# Patient Record
Sex: Male | Born: 1955 | Race: Black or African American | Hispanic: No | Marital: Single | Smoking: Never smoker
Health system: Southern US, Community
[De-identification: ages and names within clinical notes are randomized; demographics above are authoritative.]

## PROBLEM LIST (undated history)

## (undated) DIAGNOSIS — C8590 Non-Hodgkin lymphoma, unspecified, unspecified site: Secondary | ICD-10-CM

## (undated) DIAGNOSIS — R590 Localized enlarged lymph nodes: Secondary | ICD-10-CM

## (undated) DIAGNOSIS — G4730 Sleep apnea, unspecified: Secondary | ICD-10-CM

## (undated) DIAGNOSIS — E119 Type 2 diabetes mellitus without complications: Secondary | ICD-10-CM

## (undated) DIAGNOSIS — I10 Essential (primary) hypertension: Secondary | ICD-10-CM

---

## 2002-07-05 ENCOUNTER — Emergency Department (HOSPITAL_COMMUNITY): Admission: EM | Admit: 2002-07-05 | Discharge: 2002-07-05 | Payer: Self-pay | Admitting: Emergency Medicine

## 2002-07-06 ENCOUNTER — Ambulatory Visit (HOSPITAL_COMMUNITY): Admission: RE | Admit: 2002-07-06 | Discharge: 2002-07-06 | Payer: Self-pay | Admitting: Emergency Medicine

## 2002-07-06 ENCOUNTER — Encounter: Payer: Self-pay | Admitting: Emergency Medicine

## 2005-06-10 ENCOUNTER — Ambulatory Visit: Admission: RE | Admit: 2005-06-10 | Discharge: 2005-06-10 | Payer: Self-pay | Admitting: Family Medicine

## 2005-06-11 ENCOUNTER — Ambulatory Visit: Payer: Self-pay | Admitting: Pulmonary Disease

## 2005-10-12 ENCOUNTER — Ambulatory Visit (HOSPITAL_COMMUNITY): Admission: RE | Admit: 2005-10-12 | Discharge: 2005-10-12 | Payer: Self-pay | Admitting: Internal Medicine

## 2005-12-11 ENCOUNTER — Encounter (HOSPITAL_COMMUNITY): Admission: RE | Admit: 2005-12-11 | Discharge: 2006-01-10 | Payer: Self-pay | Admitting: General Surgery

## 2006-01-13 ENCOUNTER — Emergency Department (HOSPITAL_COMMUNITY): Admission: EM | Admit: 2006-01-13 | Discharge: 2006-01-14 | Payer: Self-pay | Admitting: Emergency Medicine

## 2006-07-21 ENCOUNTER — Emergency Department (HOSPITAL_COMMUNITY): Admission: EM | Admit: 2006-07-21 | Discharge: 2006-07-21 | Payer: Self-pay | Admitting: Emergency Medicine

## 2006-07-25 ENCOUNTER — Ambulatory Visit (HOSPITAL_COMMUNITY): Admission: RE | Admit: 2006-07-25 | Discharge: 2006-07-25 | Payer: Self-pay | Admitting: General Surgery

## 2006-11-07 ENCOUNTER — Ambulatory Visit: Payer: Self-pay | Admitting: Cardiology

## 2006-11-26 ENCOUNTER — Encounter: Admission: RE | Admit: 2006-11-26 | Discharge: 2007-02-24 | Payer: Self-pay | Admitting: Cardiology

## 2007-10-29 ENCOUNTER — Ambulatory Visit (HOSPITAL_COMMUNITY): Admission: RE | Admit: 2007-10-29 | Discharge: 2007-10-29 | Payer: Self-pay | Admitting: General Surgery

## 2007-11-20 DIAGNOSIS — C8590 Non-Hodgkin lymphoma, unspecified, unspecified site: Secondary | ICD-10-CM

## 2007-11-20 HISTORY — DX: Non-Hodgkin lymphoma, unspecified, unspecified site: C85.90

## 2007-12-09 ENCOUNTER — Encounter (INDEPENDENT_AMBULATORY_CARE_PROVIDER_SITE_OTHER): Payer: Self-pay | Admitting: General Surgery

## 2007-12-09 ENCOUNTER — Encounter (INDEPENDENT_AMBULATORY_CARE_PROVIDER_SITE_OTHER): Payer: Self-pay | Admitting: Diagnostic Radiology

## 2007-12-09 ENCOUNTER — Ambulatory Visit (HOSPITAL_COMMUNITY): Admission: RE | Admit: 2007-12-09 | Discharge: 2007-12-09 | Payer: Self-pay | Admitting: General Surgery

## 2008-01-02 ENCOUNTER — Ambulatory Visit (HOSPITAL_COMMUNITY): Admission: RE | Admit: 2008-01-02 | Discharge: 2008-01-02 | Payer: Self-pay | Admitting: General Surgery

## 2008-01-02 ENCOUNTER — Encounter (INDEPENDENT_AMBULATORY_CARE_PROVIDER_SITE_OTHER): Payer: Self-pay | Admitting: General Surgery

## 2008-02-06 ENCOUNTER — Encounter (HOSPITAL_COMMUNITY): Admission: RE | Admit: 2008-02-06 | Discharge: 2008-03-07 | Payer: Self-pay | Admitting: Oncology

## 2008-02-06 ENCOUNTER — Ambulatory Visit (HOSPITAL_COMMUNITY): Payer: Self-pay | Admitting: Oncology

## 2008-05-13 ENCOUNTER — Ambulatory Visit (HOSPITAL_COMMUNITY): Payer: Self-pay | Admitting: Oncology

## 2008-05-13 ENCOUNTER — Encounter (HOSPITAL_COMMUNITY): Admission: RE | Admit: 2008-05-13 | Discharge: 2008-06-12 | Payer: Self-pay | Admitting: Oncology

## 2008-09-08 ENCOUNTER — Ambulatory Visit (HOSPITAL_COMMUNITY): Payer: Self-pay | Admitting: Oncology

## 2008-09-08 ENCOUNTER — Encounter (HOSPITAL_COMMUNITY): Admission: RE | Admit: 2008-09-08 | Discharge: 2008-10-08 | Payer: Self-pay | Admitting: Oncology

## 2009-03-04 ENCOUNTER — Encounter (HOSPITAL_COMMUNITY): Admission: RE | Admit: 2009-03-04 | Discharge: 2009-04-03 | Payer: Self-pay | Admitting: Oncology

## 2009-03-04 ENCOUNTER — Ambulatory Visit (HOSPITAL_COMMUNITY): Payer: Self-pay | Admitting: Oncology

## 2009-09-12 ENCOUNTER — Encounter (HOSPITAL_COMMUNITY): Admission: RE | Admit: 2009-09-12 | Discharge: 2009-10-12 | Payer: Self-pay | Admitting: Oncology

## 2009-09-12 ENCOUNTER — Ambulatory Visit (HOSPITAL_COMMUNITY): Payer: Self-pay | Admitting: Oncology

## 2010-12-10 ENCOUNTER — Encounter (HOSPITAL_COMMUNITY): Payer: Self-pay | Admitting: Oncology

## 2011-02-22 LAB — COMPREHENSIVE METABOLIC PANEL
ALT: 16 U/L (ref 0–53)
AST: 21 U/L (ref 0–37)
Albumin: 3.8 g/dL (ref 3.5–5.2)
Alkaline Phosphatase: 65 U/L (ref 39–117)
BUN: 12 mg/dL (ref 6–23)
CO2: 31 mEq/L (ref 19–32)
Calcium: 9.3 mg/dL (ref 8.4–10.5)
Chloride: 101 mEq/L (ref 96–112)
Creatinine, Ser: 0.96 mg/dL (ref 0.4–1.5)
GFR calc Af Amer: >60 mL/min (ref >60)
GFR calc non Af Amer: >60 mL/min (ref >60)
Glucose, Bld: 115 mg/dL — ABNORMAL HIGH (ref 70–99)
Potassium: 3.6 mEq/L (ref 3.5–5.1)
Sodium: 138 mEq/L (ref 135–145)
Total Bilirubin: 1.1 mg/dL (ref 0.3–1.2)
Total Protein: 7.9 g/dL (ref 6.0–8.3)

## 2011-02-22 LAB — DIFFERENTIAL
Basophils Absolute: 0 10*3/uL (ref 0.0–0.1)
Basophils Relative: 0 % (ref 0–1)
Eosinophils Absolute: 0.1 10*3/uL (ref 0.0–0.7)
Eosinophils Relative: 3 % (ref 0–5)
Lymphocytes Relative: 18 % (ref 12–46)
Lymphs Abs: 0.8 10*3/uL (ref 0.7–4.0)
Monocytes Absolute: 0.5 10*3/uL (ref 0.1–1.0)
Monocytes Relative: 11 % (ref 3–12)
Neutro Abs: 2.9 10*3/uL (ref 1.7–7.7)
Neutrophils Relative %: 68 % (ref 43–77)

## 2011-02-22 LAB — CBC
HCT: 36.2 % — ABNORMAL LOW (ref 39.0–52.0)
Hemoglobin: 12.3 g/dL — ABNORMAL LOW (ref 13.0–17.0)
MCHC: 34 g/dL (ref 30.0–36.0)
MCV: 88.6 fL (ref 78.0–100.0)
Platelets: 196 10*3/uL (ref 150–400)
RBC: 4.09 MIL/uL — ABNORMAL LOW (ref 4.22–5.81)
RDW: 14.5 % (ref 11.5–15.5)
WBC: 4.2 10*3/uL (ref 4.0–10.5)

## 2011-02-22 LAB — LACTATE DEHYDROGENASE: LDH: 172 U/L (ref 94–250)

## 2011-02-28 LAB — COMPREHENSIVE METABOLIC PANEL
ALT: 16 U/L (ref 0–53)
AST: 20 U/L (ref 0–37)
Albumin: 3.9 g/dL (ref 3.5–5.2)
Alkaline Phosphatase: 86 U/L (ref 39–117)
BUN: 15 mg/dL (ref 6–23)
CO2: 29 mEq/L (ref 19–32)
Calcium: 9.7 mg/dL (ref 8.4–10.5)
Chloride: 97 mEq/L (ref 96–112)
Creatinine, Ser: 0.9 mg/dL (ref 0.4–1.5)
GFR calc Af Amer: >60 mL/min (ref >60)
GFR calc non Af Amer: >60 mL/min (ref >60)
Glucose, Bld: 110 mg/dL — ABNORMAL HIGH (ref 70–99)
Potassium: 3.7 mEq/L (ref 3.5–5.1)
Sodium: 136 mEq/L (ref 135–145)
Total Bilirubin: 1 mg/dL (ref 0.3–1.2)
Total Protein: 7.8 g/dL (ref 6.0–8.3)

## 2011-02-28 LAB — DIFFERENTIAL
Basophils Absolute: 0 10*3/uL (ref 0.0–0.1)
Basophils Relative: 0 % (ref 0–1)
Eosinophils Absolute: 0.1 10*3/uL (ref 0.0–0.7)
Eosinophils Relative: 2 % (ref 0–5)
Lymphocytes Relative: 17 % (ref 12–46)
Lymphs Abs: 0.9 10*3/uL (ref 0.7–4.0)
Monocytes Absolute: 0.7 10*3/uL (ref 0.1–1.0)
Monocytes Relative: 12 % (ref 3–12)
Neutro Abs: 3.7 10*3/uL (ref 1.7–7.7)
Neutrophils Relative %: 69 % (ref 43–77)

## 2011-02-28 LAB — CBC
HCT: 39 % (ref 39.0–52.0)
Hemoglobin: 13.2 g/dL (ref 13.0–17.0)
MCHC: 33.8 g/dL (ref 30.0–36.0)
MCV: 88 fL (ref 78.0–100.0)
Platelets: 200 10*3/uL (ref 150–400)
RBC: 4.44 MIL/uL (ref 4.22–5.81)
RDW: 14.2 % (ref 11.5–15.5)
WBC: 5.3 10*3/uL (ref 4.0–10.5)

## 2011-02-28 LAB — BETA 2 MICROGLOBULIN, SERUM: Beta-2 Microglobulin: 1.43 mg/L (ref 1.01–1.73)

## 2011-02-28 LAB — LACTATE DEHYDROGENASE: LDH: 187 U/L (ref 94–250)

## 2011-04-03 NOTE — Op Note (Signed)
NAME:  Douglas [name], Douglas [name] NO.:  [id number]   MEDICAL RECORD NO.:  [id number]          PATIENT TYPE:  AMB   LOCATION:  DAY                           FACILITY:  [facility]   PHYSICIAN:  Barbaraann Barthel, M.D. DATE OF BIRTH:  [birth year]   DATE OF PROCEDURE:  01/02/2008  DATE OF DISCHARGE:                               OPERATIVE REPORT   PREOPERATIVE DIAGNOSIS:  Abnormal adenopathy of the left groin status  post abnormal needle biopsy.   NOTE:  This is a 55 year old morbidly obese diabetic black male who  presented with groin adenopathy that was prominent on the left side.  He  was seen by Dr. Renard Matter. The patient had a needle biopsy and this was  nondiagnostic and tissue was requested to rule out lymphoma or some soft  tissue mass.   GROSS OPERATIVE FINDINGS:  Massive lymph node in the groin area.   PROCEDURE:  Incisional lymph node biopsy.   TECHNIQUE:  The patient was placed in the supine position.  I palpated a  grossly enlarged lymph node in the left groin near where he was biopsied  in then the past.  And after prepping with Betadine solution and draping  in the usual manner, a transverse incision was carried over this node.  The lymph node was dissected down.  This was a massively enlarged lymph  node close to the large vessels and since there was a great deal of  lymph node available, I made an incisional biopsy for diagnostic  purposes.  I did not try to remove the entire massively matted lymph  node.  After this was done, I controlled bleeding with the cautery  device and approximated the lymph node with 3-0 Vicryl and I elected to  leave a piece of Surgicel within the groin tissues.  After checking for  hemostasis, the wound was then closed using the stapling device.  Prior  to closure, all sponge, needle, and instrument counts were found to be  correct.  Estimated blood loss was minimal.  The patient received 800 mL  of crystalloids intraoperatively.  The  specimen was sent as fresh in  saline as instructed.  We made follow-up arrangements to see this  patient perioperatively for wound care.      Barbaraann Barthel, M.D.  Electronically Signed     WB/MEDQ  D:  01/02/2008  T:  01/03/2008  Job:  [id number]   cc:   Angus G. Renard Matter, MD  Fax: [phone]

## 2011-04-06 NOTE — Letter (Signed)
November 07, 2006    Douglas G. Renard Matter, MD  [address]  [place], [place] [address]   RE:  Douglas [name], Douglas [name]  MRN:  [id number]  /  DOB:  Nov 09, 1956   Dear Thalia Party,   It was my pleasure evaluating Douglas [name] in the office today in  consultation at your request.  As you know, this nice gentleman has a  history of diabetes and hypertension, resulting in a substantial risk  for vascular disease.  He has long-standing obesity.  He has not used  tobacco products.  He has dyslipidemia that is currently under  pharmacologic therapy.   Douglas [name] does not have chest discomfort, nor dyspnea.  He  maintains an active lifestyle, including performing yard work and in his  occupation as a Scientist, clinical (histocompatibility and immunogenetics).  He is diligent  about following his medical regimen.  He follows a diabetic diet.  He  has not been able to do much about his weight with diet and exercise.   PAST MEDICAL HISTORY:  Otherwise generally benign.  He was recently  evaluated for hematochezia.  I am not aware of what specific diagnosis  accounted for his bleeding.  He has otherwise never been hospitalized.  He has had no surgery.   ALLERGIES:  He has no known allergies.   CURRENT MEDICATIONS INCLUDE:  1. Metformin 500 mg daily.  2. Atorvastatin 20 mg daily.  3. Benicar/HCT 40/25 mg daily.  4. Aspirin 81 mg daily.  5. Triamterene/HCTZ 37.5/25 mg daily.   SOCIAL HISTORY:  No excessive use of alcohol.  Single with no children.   FAMILY HISTORY:  Father died at a young age with cardiac disease.  Likewise, his mother died at age 25, due to myocardial infarction.  He  has three siblings, one of which died due to trauma and one of which has  cardiac problems.   REVIEW OF SYSTEMS:  Notable for the need for corrective lenses,  intermittent constipation, episodic edema in the lower extremities.  All  other systems reviewed and are negative.   EXAM:  Pleasant, obese gentleman, in no acute  distress.  The weight is  427, blood pressure 145/85, heart rate 75 and regular, respirations 16.  HEENT:  Normal funduscopic examination; EOM full.  Normal oral mucosa.  NECK:  No jugular venous distention; normal carotid upstrokes, without  bruits.  LUNGS:  Clear with decreased breath sounds in the bases.  CARDIAC:  Normal first and second heart sounds; fourth heart sound  present; PMI not palpable.  ABDOMEN:  Obese, otherwise benign.  EXTREMITIES:  Trace edema; distal pulses intact.  NEUROMUSCULAR:  Symmetric strength and tone; normal cranial nerves.  MUSCULOSKELETAL:  No joint deformities.  SKIN:  No significant lesions.  ENDOCRINE:  No thyromegaly.   EKG:  Normal sinus rhythm, incomplete right bundle branch block.  Cannot  exclude prior septal myocardial infarction. Borderline first degree AV  block.  Left anterior fascicular block.  No prior tracing for  comparison.   LABORATORY:  Kindly supplied by your office, is superb with a hemoglobin  A1c level of 6.4, a total cholesterol of 134, HDL of 54 and LDL of 68.   IMPRESSION:  Douglas [name] has substantial cardiovascular risk, based  upon obesity, diabetes, hypertension, dyslipidemia and a worrisome  family history.  Unfortunately, due to his weight, he is not a candidate  for treadmill exercise.  Similarly, the tables for nuclear imaging  cannot accommodate a patient of his size.  Moreover, the  likelihood of a  poor quality nuclear study is extremely high.  He certainly does not  merit cardiac catheterization.  A CT scan for coronary artery scoring  could be considered, but many of the machines would not accommodate Mr.  [name] either.   I believe it is reasonable to continue optimal management of his risk  factors.  I discussed diet with him.  He will have a formal consultation  with the dietician.  I also discussed the possibility of  bariatric surgery, although I would not necessarily recommend that for  Mr.  [name] at the present time.  He is receiving duplicate  diuretics in his Dyazide and Benicar/HCT.  The latter will be changed to  plain Benicar.  Please let me know at any time that I can offer further  assistance in the care of this nice gentleman.    Sincerely,      Gerrit Friends. Dietrich Pates, MD, [facility]  Electronically Signed    RMR/MedQ  DD: 11/07/2006  DT: 11/08/2006  Job #: [id number]

## 2011-04-06 NOTE — H&P (Signed)
NAME:  Douglas [name], Douglas [name] NO.:  [id number]   MEDICAL RECORD NO.:  [id number]          PATIENT TYPE:  AMB   LOCATION:  DAY                           FACILITY:  [facility]   PHYSICIAN:  Dalia Heading, M.D.  DATE OF BIRTH:  [birth year]   DATE OF ADMISSION:  DATE OF DISCHARGE:  LH                                HISTORY & PHYSICAL   CHIEF COMPLAINT:  Hematochezia.   HISTORY OF PRESENT ILLNESS:  The patient is a 55 year old black male who is  referred for endoscopic evaluation.  Needs colonoscopy for hematochezia.  He  recently noted some blood in his stools.  No abdominal pain, weight loss,  nausea, vomiting, diarrhea, constipation, melena have been noted.  He has  never had a colonoscopy.  There is no immediate family history of colon  carcinoma.   PAST MEDICAL HISTORY:  Includes a perirectal abscess.   PAST SURGICAL HISTORY:  Unremarkable.   CURRENT MEDICATIONS:  None.   ALLERGIES:  No known drug allergies.   REVIEW OF SYSTEMS:  Noncontributory.   PHYSICAL EXAMINATION:  GENERAL:  The patient is a morbidly obese black male  in no acute distress.  LUNGS:  Clear to auscultation with equal breath sounds bilaterally.  HEART: Examination reveals regular rate and rhythm without S3, S4, or  murmurs.  ABDOMEN:  The abdomen is soft, nontender, nondistended.  No  hepatosplenomegaly or masses noted.  RECTAL:  Examination was deferred to the procedure.   IMPRESSION:  Hematochezia.   PLAN:  The patient is scheduled for colonoscopy on July 25, 2006.  Risks  and benefits of the procedure including bleeding and perforation were fully  explained to the patient, gave informed consent.      Dalia Heading, M.D.  Electronically Signed     MAJ/MEDQ  D:  07/23/2006  T:  07/23/2006  Job:  [id number]   cc:   [facility]  Fax: [phone]   Angus G. Renard Matter, MD  Fax: [phone]

## 2011-04-06 NOTE — Procedures (Signed)
NAME:  Douglas [name], Douglas [name] NO.:  [id number]   MEDICAL RECORD NO.:  [id number]          PATIENT TYPE:  OUT   LOCATION:  SLEEP LAB                     FACILITY:  [facility]   PHYSICIAN:  Marcelyn Bruins, M.D. [facility] DATE OF BIRTH:  [birth year]   DATE OF STUDY:  06/10/2005                              NOCTURNAL POLYSOMNOGRAM   REFERRING PHYSICIAN:  Butch Penny, MD   INDICATION FOR STUDY:  Hypersomnia with sleep apnea.   EPWORTH SCORE:  10   SLEEP ARCHITECTURE:  The patient had total sleep time of 275 minutes with  decreased REM and slow wave sleep. Sleep efficiency was 72%. Sleep onset  latency was normal; however, REM onset was very prolonged.   IMPRESSION:  1.  Very severe obstructive sleep apnea/hypopnea syndrome with a respiratory      disturbance index of 127 events per hour and O2 desaturation as low as      57%. Treatment of this degree of sleep apnea will primarily involves      weight loss coupled with CPAP.  2.  Very loud snoring noted throughout the study.  3.  Occasional premature ventricular contractures noted.  4.  O2 at 1/2 liter per minute was added because of the patient's severe O2      desaturation.     ______________________________  Suzzette Righter    KC/MEDQ  D:  06/11/2005 16:04:08  T:  06/11/2005 22:36:58  Job:  [id number]

## 2011-08-10 LAB — CBC
HCT: 35.6 — ABNORMAL LOW
HCT: 35.3 — ABNORMAL LOW
Hemoglobin: 11.9 — ABNORMAL LOW
Hemoglobin: 11.9 — ABNORMAL LOW
MCHC: 33.6
MCHC: 33.8
MCV: 87.7
MCV: 88.2
Platelets: 208
Platelets: 210
RBC: 4.06 — ABNORMAL LOW
RBC: 4 — ABNORMAL LOW
RDW: 14.5
RDW: 14
WBC: 3.7 — ABNORMAL LOW
WBC: 4.1

## 2011-08-10 LAB — DIFFERENTIAL
Basophils Absolute: 0
Basophils Relative: 0
Eosinophils Absolute: 0.1
Eosinophils Relative: 3
Lymphocytes Relative: 23
Lymphs Abs: 0.9
Monocytes Absolute: 0.5
Monocytes Relative: 12
Neutro Abs: 2.5
Neutrophils Relative %: 62

## 2011-08-10 LAB — BASIC METABOLIC PANEL
BUN: 15
CO2: 30
Calcium: 9.6
Chloride: 99
Creatinine, Ser: 0.87
GFR calc Af Amer: >60
GFR calc non Af Amer: >60
Glucose, Bld: 104 — ABNORMAL HIGH
Potassium: 4.2
Sodium: 136

## 2011-08-10 LAB — PROTIME-INR
INR: 1
Prothrombin Time: 13.8

## 2011-08-13 LAB — CBC
HCT: 37 — ABNORMAL LOW
Hemoglobin: 12.6 — ABNORMAL LOW
MCHC: 34
MCV: 87.1
Platelets: 235
RBC: 4.24
RDW: 13.9
WBC: 4.6

## 2011-08-13 LAB — COMPREHENSIVE METABOLIC PANEL
ALT: 18
AST: 21
Albumin: 3.6
Alkaline Phosphatase: 65
Chloride: 98
GFR calc Af Amer: >60
Potassium: 4
Sodium: 135
Total Bilirubin: 1
Total Protein: 8

## 2011-08-13 LAB — DIFFERENTIAL
Basophils Absolute: 0
Basophils Relative: 0
Eosinophils Absolute: 0.1
Eosinophils Relative: 2
Monocytes Absolute: 0.4
Monocytes Relative: 9
Neutro Abs: 3.2

## 2011-08-16 LAB — COMPREHENSIVE METABOLIC PANEL
Alkaline Phosphatase: 62
BUN: 13
Creatinine, Ser: 0.87
Glucose, Bld: 110 — ABNORMAL HIGH
Potassium: 3.5
Total Bilirubin: 1
Total Protein: 7.2

## 2011-08-16 LAB — DIFFERENTIAL
Basophils Absolute: 0
Basophils Relative: 0
Lymphocytes Relative: 23
Neutro Abs: 3
Neutrophils Relative %: 65

## 2011-08-16 LAB — CBC
HCT: 34.5 — ABNORMAL LOW
Hemoglobin: 11.7 — ABNORMAL LOW
MCV: 87.5
RDW: 14.3

## 2011-08-16 LAB — BETA 2 MICROGLOBULIN, SERUM: Beta-2 Microglobulin: 1.81 — ABNORMAL HIGH

## 2011-08-16 LAB — LACTATE DEHYDROGENASE: LDH: 199

## 2011-08-20 LAB — DIFFERENTIAL
Basophils Relative: 0
Eosinophils Absolute: 0.2
Monocytes Absolute: 0.6
Monocytes Relative: 11
Neutro Abs: 3.8

## 2011-08-20 LAB — COMPREHENSIVE METABOLIC PANEL
ALT: 14
AST: 20
Albumin: 3.6
Alkaline Phosphatase: 69
GFR calc Af Amer: >60
Potassium: 3.8
Sodium: 138
Total Protein: 7.5

## 2011-08-20 LAB — BETA 2 MICROGLOBULIN, SERUM: Beta-2 Microglobulin: 1.72

## 2011-08-20 LAB — CBC
Platelets: 216
RDW: 14.1

## 2012-06-23 ENCOUNTER — Ambulatory Visit (HOSPITAL_COMMUNITY)
Admission: RE | Admit: 2012-06-23 | Discharge: 2012-06-23 | Disposition: A | Discharge status: Home / Self Care | Payer: BC Managed Care – PPO | Source: Ambulatory Visit | Attending: Family Medicine | Admitting: Family Medicine

## 2012-06-23 ENCOUNTER — Other Ambulatory Visit (HOSPITAL_COMMUNITY): Payer: Self-pay | Admitting: Family Medicine

## 2012-06-23 DIAGNOSIS — M25562 Pain in left knee: Secondary | ICD-10-CM

## 2012-06-23 DIAGNOSIS — R609 Edema, unspecified: Secondary | ICD-10-CM

## 2012-06-23 DIAGNOSIS — M7989 Other specified soft tissue disorders: Secondary | ICD-10-CM | POA: Insufficient documentation

## 2012-06-23 DIAGNOSIS — M25569 Pain in unspecified knee: Secondary | ICD-10-CM | POA: Insufficient documentation

## 2013-10-30 ENCOUNTER — Emergency Department (HOSPITAL_COMMUNITY): Payer: BC Managed Care – PPO

## 2013-10-30 ENCOUNTER — Inpatient Hospital Stay (HOSPITAL_COMMUNITY)
Admission: EM | Admit: 2013-10-30 | Discharge: 2013-11-03 | DRG: 580 | Disposition: A | Discharge status: Home / Self Care | Payer: BC Managed Care – PPO | Attending: Internal Medicine | Admitting: Internal Medicine

## 2013-10-30 ENCOUNTER — Encounter (HOSPITAL_COMMUNITY): Payer: Self-pay | Admitting: Emergency Medicine

## 2013-10-30 DIAGNOSIS — I10 Essential (primary) hypertension: Secondary | ICD-10-CM

## 2013-10-30 DIAGNOSIS — E119 Type 2 diabetes mellitus without complications: Secondary | ICD-10-CM | POA: Diagnosis present

## 2013-10-30 DIAGNOSIS — E785 Hyperlipidemia, unspecified: Secondary | ICD-10-CM

## 2013-10-30 DIAGNOSIS — Z79899 Other long term (current) drug therapy: Secondary | ICD-10-CM

## 2013-10-30 DIAGNOSIS — R1904 Left lower quadrant abdominal swelling, mass and lump: Secondary | ICD-10-CM

## 2013-10-30 DIAGNOSIS — I890 Lymphedema, not elsewhere classified: Secondary | ICD-10-CM | POA: Diagnosis present

## 2013-10-30 DIAGNOSIS — C8590 Non-Hodgkin lymphoma, unspecified, unspecified site: Secondary | ICD-10-CM

## 2013-10-30 DIAGNOSIS — Z6841 Body Mass Index (BMI) 40.0 and over, adult: Secondary | ICD-10-CM

## 2013-10-30 DIAGNOSIS — L0390 Cellulitis, unspecified: Secondary | ICD-10-CM

## 2013-10-30 DIAGNOSIS — C8299 Follicular lymphoma, unspecified, extranodal and solid organ sites: Secondary | ICD-10-CM | POA: Diagnosis present

## 2013-10-30 DIAGNOSIS — D638 Anemia in other chronic diseases classified elsewhere: Secondary | ICD-10-CM

## 2013-10-30 DIAGNOSIS — R2242 Localized swelling, mass and lump, left lower limb: Secondary | ICD-10-CM

## 2013-10-30 DIAGNOSIS — Z7982 Long term (current) use of aspirin: Secondary | ICD-10-CM

## 2013-10-30 DIAGNOSIS — IMO0001 Reserved for inherently not codable concepts without codable children: Secondary | ICD-10-CM

## 2013-10-30 DIAGNOSIS — G4730 Sleep apnea, unspecified: Secondary | ICD-10-CM | POA: Diagnosis present

## 2013-10-30 DIAGNOSIS — L02419 Cutaneous abscess of limb, unspecified: Principal | ICD-10-CM | POA: Diagnosis present

## 2013-10-30 HISTORY — DX: Type 2 diabetes mellitus without complications: E11.9

## 2013-10-30 HISTORY — DX: Non-Hodgkin lymphoma, unspecified, unspecified site: C85.90

## 2013-10-30 HISTORY — DX: Essential (primary) hypertension: I10

## 2013-10-30 HISTORY — DX: Sleep apnea, unspecified: G47.30

## 2013-10-30 LAB — URINALYSIS, ROUTINE W REFLEX MICROSCOPIC
Nitrite: NEGATIVE
Specific Gravity, Urine: >1.03 — ABNORMAL HIGH (ref 1.005–1.030)
pH: 5.5 (ref 5.0–8.0)

## 2013-10-30 LAB — COMPREHENSIVE METABOLIC PANEL
ALT: 8 U/L (ref 0–53)
BUN: 12 mg/dL (ref 6–23)
Calcium: 9.7 mg/dL (ref 8.4–10.5)
Creatinine, Ser: 0.83 mg/dL (ref 0.50–1.35)
GFR calc Af Amer: >90 mL/min (ref >90)
Glucose, Bld: 145 mg/dL — ABNORMAL HIGH (ref 70–99)
Sodium: 136 mEq/L (ref 135–145)
Total Protein: 8.6 g/dL — ABNORMAL HIGH (ref 6.0–8.3)

## 2013-10-30 LAB — URINE MICROSCOPIC-ADD ON

## 2013-10-30 LAB — CBC WITH DIFFERENTIAL/PLATELET
Eosinophils Absolute: 0.5 10*3/uL (ref 0.0–0.7)
Eosinophils Relative: 7 % — ABNORMAL HIGH (ref 0–5)
Lymphs Abs: 0.9 10*3/uL (ref 0.7–4.0)
MCH: 27.9 pg (ref 26.0–34.0)
MCV: 87.5 fL (ref 78.0–100.0)
Platelets: 295 10*3/uL (ref 150–400)
RBC: 4.01 MIL/uL — ABNORMAL LOW (ref 4.22–5.81)

## 2013-10-30 MED ORDER — SODIUM CHLORIDE 0.9 % IV SOLN
INTRAVENOUS | Status: DC
  Administered 2013-10-30: 17:00:00 via INTRAVENOUS
  Administered 2013-11-02: 50 mL/h via INTRAVENOUS

## 2013-10-30 MED ORDER — IOHEXOL 300 MG/ML  SOLN
20.0000 mL | INTRAMUSCULAR | Status: AC
  Administered 2013-10-30: 25 mL via ORAL

## 2013-10-30 MED ORDER — IOHEXOL 300 MG/ML  SOLN
100.0000 mL | Freq: Once | INTRAMUSCULAR | Status: AC | PRN
  Administered 2013-10-30: 100 mL via INTRAVENOUS

## 2013-10-30 NOTE — ED Notes (Signed)
Pain LLQ, intermittently for 2 weeks , with swelling of lt leg.  Hx of NHL, 2009.

## 2013-10-30 NOTE — ED Notes (Signed)
Called Douglas [name] in Radiology to let them know that pt was on his way and had a 20g saline lock in his right forearm.

## 2013-10-30 NOTE — ED Provider Notes (Signed)
CSN: [id number]     Arrival date & time 10/30/13  1624 History  This chart was scribed for Hilario Quarry, MD by Quintella Reichert, ED scribe.  This patient was seen in room APA09/APA09 and the patient's care was started at 4:45 PM.   Chief Complaint  Patient presents with  . Abdominal Pain    Patient is a 57 y.o. male presenting with abdominal pain. The history is provided by the patient. No language interpreter was used.  Abdominal Pain Pain location:  LLQ Pain severity:  Moderate Duration: 1.5 weeks. Timing:  Intermittent Context comment:  Non-Hodgkin's lymphoma Worsened by:  Palpation and position changes Associated symptoms: no chest pain, no diarrhea, no nausea, no shortness of breath and no vomiting   Risk factors: obesity     HPI Comments: Douglas [name] is a 57 y.o. male with non-Hodgkin's lymphoma (not currently being treated), DM, HTN and sleep apnea who presents to the Emergency Department complaining of a mass in his LLQ abdomen that has been painful intermittently for 1 1/2 weeks.  Pt states he has non-Hodgkin's lymphoma and the painful mass has been diagnosed as cancerous.  He was diagnosed with NHL in 2009 and states the mass has been growing larger over the past 2 years.  His oncologist was Dr. Mariel Sleet but he has not been treated for this in 4-5 years.  He was not receiving chemotherapy.  He states that at some point he "went to [place] and they wanted to set me up with a program and I refused the program."  Currently he rates his pain to the area at 6-8/10.  He states that pain is brought on by certain positions and by pressing on the area.  He denies HA, visual changes, CP, SOB, nausea, vomiting, diarrhea, other bowel symptoms, or recent changes to his chronic leg swelling.  He states he is ambulatory but sometimes has to elevate his legs in order to reduce his swelling.  Pt takes oral medications for his DM and states he is taking all of his medications as instructed.     Past Medical History  Diagnosis Date  . Non Hodgkin's lymphoma   . Diabetes mellitus without complication   . Hypertension   . Sleep apnea     Past Surgical History  Procedure Laterality Date  . Biopsy for lymphoma      History reviewed. No pertinent family history.   History  Substance Use Topics  . Smoking status: Never Smoker   . Smokeless tobacco: Not on file  . Alcohol Use: No     Review of Systems  Eyes: Negative for visual disturbance.  Respiratory: Negative for shortness of breath.   Cardiovascular: Positive for leg swelling (chronic, no changes). Negative for chest pain.  Gastrointestinal: Positive for abdominal pain (painful mass in LLQ). Negative for nausea, vomiting and diarrhea.  Neurological: Negative for headaches.  All other systems reviewed and are negative.     Allergies  Review of patient's allergies indicates no known allergies.  Home Medications  No current outpatient prescriptions on file.  BP 156/62  Pulse 87  Temp(Src) 99.5 F (37.5 C) (Oral)  Resp 20  Ht 5\' 10"  (1.778 m)  Wt 420 lb (190.511 kg)  BMI 60.26 kg/m2  SpO2 99%  Physical Exam  Nursing note and vitals reviewed. Constitutional: He is oriented to person, place, and time. He appears well-developed and well-nourished.  HENT:  Head: Normocephalic and atraumatic.  Right Ear: External ear normal.  Left  Ear: External ear normal.  Nose: Nose normal.  Mouth/Throat: Oropharynx is clear and moist.  Eyes: Conjunctivae and EOM are normal. Pupils are equal, round, and reactive to light.  Neck: Normal range of motion. Neck supple.  Cardiovascular: Normal rate, regular rhythm, normal heart sounds and intact distal pulses.   Pulmonary/Chest: Effort normal and breath sounds normal. No respiratory distress. He has no wheezes. He exhibits no tenderness.  Abdominal: Soft. Bowel sounds are normal. He exhibits no distension and no mass. There is no tenderness. There is no guarding.   Morbidly obese male  Musculoskeletal: Normal range of motion.  Legs swollen from knees down Approximately 10x20-cm mass in anterior upper leg, large and firm, tender to palpation  Neurological: He is alert and oriented to person, place, and time. He has normal reflexes. He exhibits normal muscle tone. Coordination normal.  Skin: Skin is warm and dry.  Chronic skin changes and chronic skin thickening to bilateral legs  Psychiatric: He has a normal mood and affect. His behavior is normal. Judgment and thought content normal.    ED Course  Procedures (including critical care time)  DIAGNOSTIC STUDIES: Oxygen Saturation is 99% on room air, normal by my interpretation.    COORDINATION OF CARE: 4:54 PM-Discussed treatment plan which includes labs with pt at bedside and pt agreed to plan.    Labs Review Labs Reviewed  CBC WITH DIFFERENTIAL - Abnormal; Notable for the following:    RBC 4.01 (*)    Hemoglobin 11.2 (*)    HCT 35.1 (*)    Eosinophils Relative 7 (*)    All other components within normal limits  COMPREHENSIVE METABOLIC PANEL - Abnormal; Notable for the following:    Glucose, Bld 145 (*)    Total Protein 8.6 (*)    Albumin 3.3 (*)    All other components within normal limits  URINALYSIS, ROUTINE W REFLEX MICROSCOPIC - Abnormal; Notable for the following:    Specific Gravity, Urine >1.030 (*)    Hgb urine dipstick TRACE (*)    Leukocytes, UA TRACE (*)    All other components within normal limits  URINE MICROSCOPIC-ADD ON    Imaging Review No results found.  EKG Interpretation   None       MDM  No diagnosis found. Patient unable to have ct here due to weight limits.  Plan transfer patient to [facility] for [facility].  Discussed with radiologist and patient.  Patient wishes to drive.  We will leave iv access inplace.  The patient is to be directed to the ed for results of ct. Discussed with Dr. Virgina Jock, MD 10/30/13 [phone]

## 2013-10-31 DIAGNOSIS — R2242 Localized swelling, mass and lump, left lower limb: Secondary | ICD-10-CM | POA: Diagnosis present

## 2013-10-31 DIAGNOSIS — I10 Essential (primary) hypertension: Secondary | ICD-10-CM | POA: Diagnosis present

## 2013-10-31 DIAGNOSIS — E785 Hyperlipidemia, unspecified: Secondary | ICD-10-CM | POA: Diagnosis present

## 2013-10-31 DIAGNOSIS — C8590 Non-Hodgkin lymphoma, unspecified, unspecified site: Secondary | ICD-10-CM | POA: Diagnosis present

## 2013-10-31 DIAGNOSIS — R1032 Left lower quadrant pain: Secondary | ICD-10-CM

## 2013-10-31 DIAGNOSIS — R1904 Left lower quadrant abdominal swelling, mass and lump: Secondary | ICD-10-CM | POA: Diagnosis present

## 2013-10-31 DIAGNOSIS — L0291 Cutaneous abscess, unspecified: Secondary | ICD-10-CM

## 2013-10-31 DIAGNOSIS — D638 Anemia in other chronic diseases classified elsewhere: Secondary | ICD-10-CM

## 2013-10-31 DIAGNOSIS — C8589 Other specified types of non-Hodgkin lymphoma, extranodal and solid organ sites: Secondary | ICD-10-CM

## 2013-10-31 DIAGNOSIS — L0390 Cellulitis, unspecified: Secondary | ICD-10-CM | POA: Diagnosis present

## 2013-10-31 LAB — GLUCOSE, CAPILLARY
Glucose-Capillary: 127 mg/dL — ABNORMAL HIGH (ref 70–99)
Glucose-Capillary: 124 mg/dL — ABNORMAL HIGH (ref 70–99)
Glucose-Capillary: 146 mg/dL — ABNORMAL HIGH (ref 70–99)

## 2013-10-31 MED ORDER — ASPIRIN EC 81 MG PO TBEC
81.0000 mg | DELAYED_RELEASE_TABLET | Freq: Every day | ORAL | Status: DC
  Administered 2013-10-31 – 2013-11-03 (×4): 81 mg via ORAL
  Filled 2013-10-31 (×4): qty 1

## 2013-10-31 MED ORDER — ONDANSETRON HCL 4 MG/2ML IJ SOLN
4.0000 mg | Freq: Once | INTRAMUSCULAR | Status: AC
  Administered 2013-10-31: 4 mg via INTRAVENOUS
  Filled 2013-10-31: qty 2

## 2013-10-31 MED ORDER — INSULIN ASPART 100 UNIT/ML ~~LOC~~ SOLN
0.0000 [IU] | Freq: Three times a day (TID) | SUBCUTANEOUS | Status: DC
  Administered 2013-10-31 – 2013-11-03 (×5): 2 [IU] via SUBCUTANEOUS

## 2013-10-31 MED ORDER — HYDROCHLOROTHIAZIDE 25 MG PO TABS
25.0000 mg | ORAL_TABLET | Freq: Every day | ORAL | Status: DC
  Administered 2013-10-31 – 2013-11-03 (×4): 25 mg via ORAL
  Filled 2013-10-31 (×4): qty 1

## 2013-10-31 MED ORDER — FENTANYL CITRATE 0.05 MG/ML IJ SOLN
50.0000 ug | INTRAMUSCULAR | Status: DC | PRN
  Administered 2013-10-31 (×2): 50 ug via INTRAVENOUS
  Filled 2013-10-31 (×2): qty 2

## 2013-10-31 MED ORDER — CLINDAMYCIN PHOSPHATE 300 MG/50ML IV SOLN
300.0000 mg | Freq: Three times a day (TID) | INTRAVENOUS | Status: DC
  Administered 2013-10-31 – 2013-11-02 (×7): 300 mg via INTRAVENOUS
  Filled 2013-10-31 (×7): qty 50

## 2013-10-31 MED ORDER — IRBESARTAN 300 MG PO TABS
300.0000 mg | ORAL_TABLET | Freq: Every day | ORAL | Status: DC
  Administered 2013-10-31 – 2013-11-03 (×4): 300 mg via ORAL
  Filled 2013-10-31 (×4): qty 1

## 2013-10-31 MED ORDER — INSULIN ASPART 100 UNIT/ML ~~LOC~~ SOLN: 0.0000 [IU] | Freq: Three times a day (TID) | SUBCUTANEOUS | Status: DC

## 2013-10-31 MED ORDER — VALSARTAN-HYDROCHLOROTHIAZIDE 320-25 MG PO TABS: 1.0000 | ORAL_TABLET | Freq: Every day | ORAL | Status: DC

## 2013-10-31 MED ORDER — ATORVASTATIN CALCIUM 20 MG PO TABS
20.0000 mg | ORAL_TABLET | Freq: Every day | ORAL | Status: DC
  Administered 2013-10-31 – 2013-11-02 (×3): 20 mg via ORAL
  Filled 2013-10-31 (×4): qty 1

## 2013-10-31 MED ORDER — HEPARIN SODIUM (PORCINE) 5000 UNIT/ML IJ SOLN
5000.0000 [IU] | Freq: Three times a day (TID) | INTRAMUSCULAR | Status: DC
  Filled 2013-10-31 (×4): qty 1

## 2013-10-31 NOTE — ED Provider Notes (Signed)
CSN: [id number]     Arrival date & time 10/30/13  1624 History   First MD Initiated Contact with Patient 10/30/13 1644     Chief Complaint  Patient presents with  . Abdominal Pain   (Consider location/radiation/quality/duration/timing/severity/associated sxs/prior Treatment) HPI  HX per PT - has NHL, now about 10 days of increasing mod to severe pain LLQ ABD and L groin. Worse with movement, currently not treated for NHL. He has known mass/ lymphoma in this region. No F/C, no N/V/D. No weakness or numbness.   Past Medical History  Diagnosis Date  . Non Hodgkin's lymphoma   . Diabetes mellitus without complication   . Hypertension   . Sleep apnea    Past Surgical History  Procedure Laterality Date  . Biopsy for lymphoma     History reviewed. No pertinent family history. History  Substance Use Topics  . Smoking status: Never Smoker   . Smokeless tobacco: Not on file  . Alcohol Use: No    Review of Systems  Constitutional: Negative for fever and chills.  Respiratory: Negative for shortness of breath.   Cardiovascular: Negative for chest pain.  Gastrointestinal: Positive for abdominal pain.  Genitourinary: Negative for dysuria.  Musculoskeletal: Negative for back pain, neck pain and neck stiffness.  Skin: Negative for rash.  Neurological: Negative for headaches.  All other systems reviewed and are negative.    Allergies  Review of patient's allergies indicates no known allergies.  Home Medications   Current Outpatient Rx  Name  Route  Sig  Dispense  Refill  . aspirin EC 81 MG tablet   Oral   Take 81 mg by mouth daily.         [name] atorvastatin (LIPITOR) 20 MG tablet   Oral   Take 20 mg by mouth daily.         . pioglitazone-metformin (ACTOPLUS MET) 15-500 MG per tablet   Oral   Take 1 tablet by mouth 2 (two) times daily with a meal.         . valsartan-hydrochlorothiazide (DIOVAN-HCT) 320-25 MG per tablet   Oral   Take 1 tablet by mouth daily.           BP 140/64  Pulse 84  Temp(Src) 99.5 F (37.5 C) (Oral)  Resp 22  Ht 5\' 10"  (1.778 m)  Wt 470 lb 7 oz (213.389 kg)  BMI 67.50 kg/m2  SpO2 98% Physical Exam  Constitutional: He is oriented to person, place, and time. He appears well-developed and well-nourished.  HENT:  Head: Normocephalic and atraumatic.  Eyes: EOM are normal. Pupils are equal, round, and reactive to light.  Neck: Neck supple.  Cardiovascular: Normal rate, regular rhythm and intact distal pulses.   Pulmonary/Chest: Effort normal and breath sounds normal. No respiratory distress.  Abdominal:  Soft, obese, TTP L groin and LLQ  Musculoskeletal: Normal range of motion. He exhibits edema.  Neurological: He is alert and oriented to person, place, and time.  Skin: Skin is warm and dry.    ED Course  Procedures (including critical care time) Labs Review Labs Reviewed  CBC WITH DIFFERENTIAL - Abnormal; Notable for the following:    RBC 4.01 (*)    Hemoglobin 11.2 (*)    HCT 35.1 (*)    Eosinophils Relative 7 (*)    All other components within normal limits  COMPREHENSIVE METABOLIC PANEL - Abnormal; Notable for the following:    Glucose, Bld 145 (*)    Total Protein 8.6 (*)  Albumin 3.3 (*)    All other components within normal limits  URINALYSIS, ROUTINE W REFLEX MICROSCOPIC - Abnormal; Notable for the following:    Specific Gravity, Urine >1.030 (*)    Hgb urine dipstick TRACE (*)    Leukocytes, UA TRACE (*)    All other components within normal limits  URINE MICROSCOPIC-ADD ON   Imaging Review Ct Abdomen Pelvis W Contrast  10/31/2013   CLINICAL DATA:  Left groin pain.  Non-Hodgkin's lymphoma.  EXAM: CT ABDOMEN AND PELVIS WITH CONTRAST  TECHNIQUE: Multidetector CT imaging of the abdomen and pelvis was performed using the standard protocol following bolus administration of intravenous contrast.  CONTRAST:  OMNIPAQUE IOHEXOL 300 MG/ML  SOLN  COMPARISON:  CT abdomen and pelvis 02/06/2008.   FINDINGS: BODY WALL: Morbid obesity. There are portions of the patient's body which could not be included within the scan due to his size.  In the left groin there is a bulky mass with approximate cross-section of 6 x 15 x 17 cm. There is infra inguinal extension into the upper thigh. Moderate surrounding cellulitic change. Surgical clips are seen superficial to this lesion. No involvement of the scrotum. No air or central necrosis. The findings are most consistent with bulky recurrence of tumor with conglomerate lymph node mass. Recommend correlation with prior surgical pathology and/or repeat tissue sampling as clinically indicated. This mass was not present previously on the scan of 2009.  LOWER CHEST: Unremarkable.  ABDOMEN/PELVIS:  Liver: No focal abnormality.  Biliary: No evidence of biliary obstruction or stone.  Pancreas: Unremarkable.  Spleen: Unremarkable.  Adrenals: Unremarkable.  Kidneys and ureters: No hydronephrosis or stone.  Bladder: Unremarkable.  Reproductive: Unremarkable.  Bowel: No obstruction. Normal appendix.  Retroperitoneum: No mass or adenopathy.  Peritoneum: No free fluid or gas.  Vascular: No acute abnormality.  OSSEOUS: No acute abnormalities.  IMPRESSION: 6 x 15 x 17 mm bulky lesion in the left groin, suspected conglomerate lymph node mass in this patient with non-Hodgkin's lymphoma. Moderate surrounding cellulitic change. Correlate with prior biopsy and/or repeat tissue sampling recommended.   Electronically Signed   By: Davonna Belling M.D.   On: 10/31/2013 00:44   IV fentanyl pain control CT results shared with PT  - he is requesting Tx for NHL 2:17 AM d/w Dr Julian Reil - will admit  MDM   1. Groin pain, left lower quadrant    Ct reviewed as above Labs reviewed IV narcotics MED admit    Sunnie Nielsen, MD 10/31/13 [phone]

## 2013-10-31 NOTE — Progress Notes (Signed)
Pt placed on CPAP set at 11 CMH2O per home settings via FFM.  Pt tolerating well at this time, RT to monitor and assess as needed.

## 2013-10-31 NOTE — Progress Notes (Addendum)
[facility] PROGRESS NOTE  Douglas [name]:[id number] DOB: 03-23-1956 DOA: 10/30/2013 PCP: Alice Reichert, MD  Brief narrative: 57 y.o. male with known history of NHL who presents to the [facility] ED 10/30/213 with worsening discomfort from a slowly enlarging left medial thigh mass  for the past 2 years. Pt reported increasing  Discomfort at the area but no fever or chills. No open wounds. CT abdomen revealed left thigh mass likely related to NHL as well as surrounding cellulitic changes.   Assessment/Plan:  Principal Problem:   Mass of left thigh - based on CT abd findings, likely related to history of NHL - due to findings of possible surrounding cellulitic changes will start clinda - appreciate surgery consult Active Problems:   NHL (non-Hodgkin's lymphoma) - never had treatment for NHL: will consult onc for input   Cellulitis - started clindamycin   HTN (hypertension) - continue Hctz   Dyslipidemia - continue atorvastatin   Anemia of chronic disease - likely related to history of NHL - hemoglobin stable - no indications for transfusion   Morbid obesity - nutrition consulted  Code Status: full code Family Communication: no family at the bedside Disposition Plan: home once stable  [name], MD  [facility] Pager [phone]  If 7PM-7AM, please contact night-coverage [email or web address] Password [email or web address] 10/31/2013, 7:03 AM   LOS: 1 day   Consultants:  None   Procedures:  None   Antibiotics:  Clindamycin 10/31/2013 -->  HPI/Subjective: No overnight events.   Objective: Filed Vitals:   10/31/13 0415 10/31/13 0431 10/31/13 0445 10/31/13 0507  BP: 135/69 130/56 164/95   Pulse: 77 79 98   Temp:   98.7 F (37.1 C)   TempSrc:   Oral   Resp:   20   Height:    5\' 10"  (1.778 m)  Weight:    214.5 kg (472 lb 14.2 oz)  SpO2: 99% 98% 98%    No intake or output data in the 24 hours ending 10/31/13 0703  Exam:   General:  Pt is alert, follows  commands appropriately, not in acute distress  Cardiovascular: Regular rate and rhythm, S1/S2 appreciated   Respiratory: Clear to auscultation bilaterally, no wheezing, no crackles, no rhonchi  Abdomen: Soft, non tender, non distended, bowel sounds present, no guarding  Extremities: morbidly obese extremities; left medial thigh mass pretty decent size and bulging out with no drainage, some tenderness on palpation, not fluctuating  Neuro: Grossly nonfocal  Data Reviewed: Basic Metabolic Panel:  Recent Labs Lab 10/30/13 1709  NA 136  K 3.7  CL 96  CO2 28  GLUCOSE 145*  BUN 12  CREATININE 0.83  CALCIUM 9.7   Liver Function Tests:  Recent Labs Lab 10/30/13 1709  AST 18  ALT 8  ALKPHOS 86  BILITOT 0.5  PROT 8.6*  ALBUMIN 3.3*   No results found for this basename: LIPASE, AMYLASE,  in the last 168 hours No results found for this basename: AMMONIA,  in the last 168 hours CBC:  Recent Labs Lab 10/30/13 1709  WBC 6.5  NEUTROABS 4.6  HGB 11.2*  HCT 35.1*  MCV 87.5  PLT 295   Cardiac Enzymes: No results found for this basename: CKTOTAL, CKMB, CKMBINDEX, TROPONINI,  in the last 168 hours BNP: No components found with this basename: POCBNP,  CBG: No results found for this basename: GLUCAP,  in the last 168 hours  No results found for this or any previous visit (from the past 240 hour(s)).   Studies:  Ct Abdomen Pelvis W Contrast 10/31/2013    IMPRESSION: 6 x 15 x 17 mm bulky lesion in the left groin, suspected conglomerate lymph node mass in this patient with non-Hodgkin's lymphoma. Moderate surrounding cellulitic change. Correlate with prior biopsy and/or repeat tissue sampling recommended.    Scheduled Meds: . aspirin EC  81 mg Oral Daily  . atorvastatin  20 mg Oral q1800  . heparin  5,000 Units Subcutaneous Q8H  . hydrochlorothiazide  25 mg Oral Daily  . insulin aspart  0-15 Units Subcutaneous TID WC  . irbesartan  300 mg Oral Daily   Continuous  Infusions: . sodium chloride Stopped (10/30/13 2114)

## 2013-10-31 NOTE — ED Notes (Signed)
Pt called back to CT to get further images.

## 2013-10-31 NOTE — Care Management Note (Signed)
UR Complete   Aala Ransom,RN,MSN [phone]

## 2013-10-31 NOTE — ED Notes (Signed)
Per Beacher May, MD, pt to remain in the ER until the appropriate floor can be decided upon.

## 2013-10-31 NOTE — H&P (Signed)
[facility] History and Physical  Douglas [name] ZOX:[id number] DOB: Dec 21, 1955 DOA: 10/30/2013  Referring physician: EDP PCP: Alice Reichert, MD   Chief Complaint: Groin mass   HPI: Douglas [name] is a 57 y.o. male with known history of NHL who presents to the ED with worsening discomfort from a slowly enlarging mass.  Mass is located in his LLQ abdomen and groin, has been growing for the past 2 years.  He has a h/o NHL that has not been treated for the past 4-5 years.  The groin mass has been irritating him constantly for the past 10 days or so.  Because of this he now wishes to have treatment.  Review of Systems: Systems reviewed.  As above, otherwise negative  Past Medical History  Diagnosis Date  . Non Hodgkin's lymphoma   . Diabetes mellitus without complication   . Hypertension   . Sleep apnea    Past Surgical History  Procedure Laterality Date  . Biopsy for lymphoma     Social History:  reports that he has never smoked. He does not have any smokeless tobacco history on file. He reports that he does not drink alcohol or use illicit drugs.  No Known Allergies  History reviewed. No pertinent family history.   Prior to Admission medications   Medication Sig Start Date End Date Taking? Authorizing Provider  aspirin EC 81 MG tablet Take 81 mg by mouth daily.   Yes Historical Provider, MD  atorvastatin (LIPITOR) 20 MG tablet Take 20 mg by mouth daily.   Yes Historical Provider, MD  pioglitazone-metformin (ACTOPLUS MET) 15-500 MG per tablet Take 1 tablet by mouth 2 (two) times daily with a meal.   Yes Historical Provider, MD  valsartan-hydrochlorothiazide (DIOVAN-HCT) 320-25 MG per tablet Take 1 tablet by mouth daily.   Yes Historical Provider, MD   Physical Exam: Filed Vitals:   10/31/13 0233  BP: 135/56  Pulse: 79  Temp: 98.8 F (37.1 C)  Resp:     BP 135/56  Pulse 79  Temp(Src) 98.8 F (37.1 C) (Oral)  Resp 22  Ht 5\' 10"  (1.778 m)  Wt  213.389 kg (470 lb 7 oz)  BMI 67.50 kg/m2  SpO2 95%  General Appearance:    Alert, oriented, no distress, appears stated age  Head:    Normocephalic, atraumatic  Eyes:    PERRL, EOMI, sclera non-icteric        Nose:   Nares without drainage or epistaxis. Mucosa, turbinates normal  Throat:   Moist mucous membranes. Oropharynx without erythema or exudate.  Neck:   Supple. No carotid bruits.  No thyromegaly.  No lymphadenopathy.   Back:     No CVA tenderness, no spinal tenderness  Lungs:     Clear to auscultation bilaterally, without wheezes, rhonchi or rales  Chest wall:    No tenderness to palpitation  Heart:    Regular rate and rhythm without murmurs, gallops, rubs  Abdomen:     Soft, non-tender, nondistended, normal bowel sounds, no organomegaly  Genitalia:    deferred  Rectal:    deferred  Extremities:   No clubbing, cyanosis or edema.  Pulses:   2+ and symmetric all extremities  Skin:   Skin color, texture, turgor normal, no rashes or lesions  Lymph nodes:   Cervical, supraclavicular, and axillary nodes normal  Neurologic:   CNII-XII intact. Normal strength, sensation and reflexes      throughout    Labs on Admission:  Basic Metabolic Panel:  Recent Labs Lab 10/30/13 1709  NA 136  K 3.7  CL 96  CO2 28  GLUCOSE 145*  BUN 12  CREATININE 0.83  CALCIUM 9.7   Liver Function Tests:  Recent Labs Lab 10/30/13 1709  AST 18  ALT 8  ALKPHOS 86  BILITOT 0.5  PROT 8.6*  ALBUMIN 3.3*   No results found for this basename: LIPASE, AMYLASE,  in the last 168 hours No results found for this basename: AMMONIA,  in the last 168 hours CBC:  Recent Labs Lab 10/30/13 1709  WBC 6.5  NEUTROABS 4.6  HGB 11.2*  HCT 35.1*  MCV 87.5  PLT 295   Cardiac Enzymes: No results found for this basename: CKTOTAL, CKMB, CKMBINDEX, TROPONINI,  in the last 168 hours  BNP (last 3 results) No results found for this basename: PROBNP,  in the last 8760 hours CBG: No results found for  this basename: GLUCAP,  in the last 168 hours  Radiological Exams on Admission: Ct Abdomen Pelvis W Contrast  10/31/2013   CLINICAL DATA:  Left groin pain.  Non-Hodgkin's lymphoma.  EXAM: CT ABDOMEN AND PELVIS WITH CONTRAST  TECHNIQUE: Multidetector CT imaging of the abdomen and pelvis was performed using the standard protocol following bolus administration of intravenous contrast.  CONTRAST:  OMNIPAQUE IOHEXOL 300 MG/ML  SOLN  COMPARISON:  CT abdomen and pelvis 02/06/2008.  FINDINGS: BODY WALL: Morbid obesity. There are portions of the patient's body which could not be included within the scan due to his size.  In the left groin there is a bulky mass with approximate cross-section of 6 x 15 x 17 cm. There is infra inguinal extension into the upper thigh. Moderate surrounding cellulitic change. Surgical clips are seen superficial to this lesion. No involvement of the scrotum. No air or central necrosis. The findings are most consistent with bulky recurrence of tumor with conglomerate lymph node mass. Recommend correlation with prior surgical pathology and/or repeat tissue sampling as clinically indicated. This mass was not present previously on the scan of 2009.  LOWER CHEST: Unremarkable.  ABDOMEN/PELVIS:  Liver: No focal abnormality.  Biliary: No evidence of biliary obstruction or stone.  Pancreas: Unremarkable.  Spleen: Unremarkable.  Adrenals: Unremarkable.  Kidneys and ureters: No hydronephrosis or stone.  Bladder: Unremarkable.  Reproductive: Unremarkable.  Bowel: No obstruction. Normal appendix.  Retroperitoneum: No mass or adenopathy.  Peritoneum: No free fluid or gas.  Vascular: No acute abnormality.  OSSEOUS: No acute abnormalities.  IMPRESSION: 6 x 15 x 17 mm bulky lesion in the left groin, suspected conglomerate lymph node mass in this patient with non-Hodgkin's lymphoma. Moderate surrounding cellulitic change. Correlate with prior biopsy and/or repeat tissue sampling recommended.    Electronically Signed   By: Davonna Belling M.D.   On: 10/31/2013 00:44    EKG: Independently reviewed.  Assessment/Plan Active Problems:   LLQ abdominal mass   NHL (non-Hodgkin's lymphoma)   1. LLQ groin mass, NHL - NHL tumor causing irritation, spoke briefly with general surgery but their opinion is that given that the patient does have tissue diagnosis already that the most appropriate next step is hematology and oncology evaluation.  Will Admit patient to [facility] so this can be done. 2. DM2 - putting patient on SSI and holding home metformin given the IV contrast load he received with CT.   Code Status: Full  Family Communication: Patient requests that we not inform family of his condition or diagnosis Disposition Plan: Admit to obs   Time spent:  50 min  Douglas [name] M. [facility] Pager [phone]  If 7AM-7PM, please contact the day team taking care of the patient [email or web address] Password [email or web address] 10/31/2013, 4:04 AM

## 2013-10-31 NOTE — ED Notes (Signed)
This RN gave to report to [facility]. ETA 5-7 minutes.

## 2013-11-01 DIAGNOSIS — R599 Enlarged lymph nodes, unspecified: Secondary | ICD-10-CM

## 2013-11-01 LAB — PROTIME-INR: INR: 1.09 (ref 0.00–1.49)

## 2013-11-01 MED ORDER — FENTANYL CITRATE 0.05 MG/ML IJ SOLN: 50.0000 ug | INTRAMUSCULAR | Status: DC | PRN

## 2013-11-01 MED ORDER — IBUPROFEN 800 MG PO TABS
400.0000 mg | ORAL_TABLET | Freq: Four times a day (QID) | ORAL | Status: DC | PRN
  Administered 2013-11-01: 400 mg via ORAL
  Filled 2013-11-01: qty 1

## 2013-11-01 MED ORDER — BENZONATATE 100 MG PO CAPS
200.0000 mg | ORAL_CAPSULE | Freq: Three times a day (TID) | ORAL | Status: DC | PRN
  Administered 2013-11-01 – 2013-11-02 (×2): 200 mg via ORAL
  Filled 2013-11-01 (×3): qty 2

## 2013-11-01 NOTE — Progress Notes (Signed)
Patient complained of a dry cough, requested a prn med. MD on call paged and made aware, with new orders received.

## 2013-11-01 NOTE — Progress Notes (Signed)
Pt brought in home tubing and mask to use tonight.  Pt stated that he is comfortable administering when ready.  Pt to call if any problems should arise.  RT to monitor and assess as needed.

## 2013-11-01 NOTE — Consult Note (Signed)
Reason for Consult:  Painful left inguinal mass Referring Physician: Dr. Jonette Eva is an 57 y.o. male.  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 At that time, he decided not to undergo treatment. He's had a progressively enlarging left inguinal mass over the past year or 2 has become painful now. He was admitted to the hospital because of that. CT scan demonstrates a large mass in the left groin consistent with significant adenopathy was inflammatory changes surrounding it. There is concern that his lymphoma may have transformed into a more aggressive type. We have been asked to see him to perform a repeat biopsy.  He denies any fever or chills. He denies any night sweats. He denies any weight loss.  Past Medical History  Diagnosis Date  . Non Hodgkin's lymphoma   . Diabetes mellitus without complication   . Hypertension   . Sleep apnea     Past Surgical History  Procedure Laterality Date  . Biopsy for lymphoma-left groin      History reviewed. No pertinent family history.  Social History:  reports that he has never smoked. He does not have any smokeless tobacco history on file. He reports that he does not drink alcohol or use illicit drugs.  Allergies: No Known Allergies  Prior to Admission medications   Medication Sig Start Date End Date Taking? Authorizing Provider  aspirin EC 81 MG tablet Take 81 mg by mouth daily.   Yes Historical Provider, MD  atorvastatin (LIPITOR) 20 MG tablet Take 20 mg by mouth daily.   Yes Historical Provider, MD  pioglitazone-metformin (ACTOPLUS MET) 15-500 MG per tablet Take 1 tablet by mouth 2 (two) times daily with a meal.   Yes Historical Provider, MD  valsartan-hydrochlorothiazide (DIOVAN-HCT) 320-25 MG per tablet Take 1 tablet by mouth daily.   Yes Historical Provider, MD     Results for orders placed during the hospital encounter of 10/30/13  (from the past 48 hour(s))  CBC WITH DIFFERENTIAL     Status: Abnormal   Collection Time    10/30/13  5:09 PM      Result Value Range   WBC 6.5  4.0 - 10.5 K/uL   RBC 4.01 (*) 4.22 - 5.81 MIL/uL   Hemoglobin 11.2 (*) 13.0 - 17.0 g/dL   HCT 16.1 (*) 09.6 - 04.5 %   MCV 87.5  78.0 - 100.0 fL   MCH 27.9  26.0 - 34.0 pg   MCHC 31.9  30.0 - 36.0 g/dL   RDW 40.9  81.1 - 91.4 %   Platelets 295  150 - 400 K/uL   Neutrophils Relative % 70  43 - 77 %   Neutro Abs 4.6  1.7 - 7.7 K/uL   Lymphocytes Relative 14  12 - 46 %   Lymphs Abs 0.9  0.7 - 4.0 K/uL   Monocytes Relative 8  3 - 12 %   Monocytes Absolute 0.5  0.1 - 1.0 K/uL   Eosinophils Relative 7 (*) 0 - 5 %   Eosinophils Absolute 0.5  0.0 - 0.7 K/uL   Basophils Relative 0  0 - 1 %   Basophils Absolute 0.0  0.0 - 0.1 K/uL  COMPREHENSIVE METABOLIC PANEL     Status: Abnormal   Collection Time    10/30/13  5:09 PM      Result Value Range   Sodium 136  135 - 145 mEq/L  Potassium 3.7  3.5 - 5.1 mEq/L   Chloride 96  96 - 112 mEq/L   CO2 28  19 - 32 mEq/L   Glucose, Bld 145 (*) 70 - 99 mg/dL   BUN 12  6 - 23 mg/dL   Creatinine, Ser 3.08  0.50 - 1.35 mg/dL   Calcium 9.7  8.4 - 65.7 mg/dL   Total Protein 8.6 (*) 6.0 - 8.3 g/dL   Albumin 3.3 (*) 3.5 - 5.2 g/dL   AST 18  0 - 37 U/L   ALT 8  0 - 53 U/L   Alkaline Phosphatase 86  39 - 117 U/L   Total Bilirubin 0.5  0.3 - 1.2 mg/dL   GFR calc non Af Amer >90  >90 mL/min   GFR calc Af Amer >90  >90 mL/min   Comment: (NOTE)     The eGFR has been calculated using the CKD EPI equation.     This calculation has not been validated in all clinical situations.     eGFR's persistently <90 mL/min signify possible Chronic Kidney     Disease.  URINALYSIS, ROUTINE W REFLEX MICROSCOPIC     Status: Abnormal   Collection Time    10/30/13  6:36 PM      Result Value Range   Color, Urine YELLOW  YELLOW   APPearance CLEAR  CLEAR   Specific Gravity, Urine >1.030 (*) 1.005 - 1.030   pH 5.5  5.0 - 8.0    Glucose, UA NEGATIVE  NEGATIVE mg/dL   Hgb urine dipstick TRACE (*) NEGATIVE   Bilirubin Urine NEGATIVE  NEGATIVE   Ketones, ur NEGATIVE  NEGATIVE mg/dL   Protein, ur NEGATIVE  NEGATIVE mg/dL   Urobilinogen, UA 0.2  0.0 - 1.0 mg/dL   Nitrite NEGATIVE  NEGATIVE   Leukocytes, UA TRACE (*) NEGATIVE  URINE MICROSCOPIC-ADD ON     Status: None   Collection Time    10/30/13  6:36 PM      Result Value Range   Squamous Epithelial / LPF RARE  RARE   WBC, UA 0-2  <3 WBC/hpf   Bacteria, UA RARE  RARE  GLUCOSE, CAPILLARY     Status: Abnormal   Collection Time    10/31/13  7:42 AM      Result Value Range   Glucose-Capillary 127 (*) 70 - 99 mg/dL  GLUCOSE, CAPILLARY     Status: Abnormal   Collection Time    10/31/13 12:03 PM      Result Value Range   Glucose-Capillary 119 (*) 70 - 99 mg/dL  GLUCOSE, CAPILLARY     Status: Abnormal   Collection Time    10/31/13  5:46 PM      Result Value Range   Glucose-Capillary 124 (*) 70 - 99 mg/dL  GLUCOSE, CAPILLARY     Status: Abnormal   Collection Time    10/31/13  9:56 PM      Result Value Range   Glucose-Capillary 146 (*) 70 - 99 mg/dL   Comment 1 Notify RN    GLUCOSE, CAPILLARY     Status: Abnormal   Collection Time    11/01/13  7:50 AM      Result Value Range   Glucose-Capillary 125 (*) 70 - 99 mg/dL    Ct Abdomen Pelvis W Contrast  10/31/2013   CLINICAL DATA:  Left groin pain.  Non-Hodgkin's lymphoma.  EXAM: CT ABDOMEN AND PELVIS WITH CONTRAST  TECHNIQUE: Multidetector CT imaging of the abdomen and pelvis was  performed using the standard protocol following bolus administration of intravenous contrast.  CONTRAST:  OMNIPAQUE IOHEXOL 300 MG/ML  SOLN  COMPARISON:  CT abdomen and pelvis 02/06/2008.  FINDINGS: BODY WALL: Morbid obesity. There are portions of the patient's body which could not be included within the scan due to his size.  In the left groin there is a bulky mass with approximate cross-section of 6 x 15 x 17 cm. There is infra  inguinal extension into the upper thigh. Moderate surrounding cellulitic change. Surgical clips are seen superficial to this lesion. No involvement of the scrotum. No air or central necrosis. The findings are most consistent with bulky recurrence of tumor with conglomerate lymph node mass. Recommend correlation with prior surgical pathology and/or repeat tissue sampling as clinically indicated. This mass was not present previously on the scan of 2009.  LOWER CHEST: Unremarkable.  ABDOMEN/PELVIS:  Liver: No focal abnormality.  Biliary: No evidence of biliary obstruction or stone.  Pancreas: Unremarkable.  Spleen: Unremarkable.  Adrenals: Unremarkable.  Kidneys and ureters: No hydronephrosis or stone.  Bladder: Unremarkable.  Reproductive: Unremarkable.  Bowel: No obstruction. Normal appendix.  Retroperitoneum: No mass or adenopathy.  Peritoneum: No free fluid or gas.  Vascular: No acute abnormality.  OSSEOUS: No acute abnormalities.  IMPRESSION: 6 x 15 x 17 mm bulky lesion in the left groin, suspected conglomerate lymph node mass in this patient with non-Hodgkin's lymphoma. Moderate surrounding cellulitic change. Correlate with prior biopsy and/or repeat tissue sampling recommended.   Electronically Signed   By: Davonna Belling M.D.   On: 10/31/2013 00:44    Review of Systems  Constitutional: Negative for fever, chills, weight loss and malaise/fatigue.  Respiratory: Negative for shortness of breath.   Cardiovascular: Negative for chest pain.  Gastrointestinal: Negative for abdominal pain and constipation.  Genitourinary: Negative for dysuria.  Endo/Heme/Allergies:       No DVT   Blood pressure 116/60, pulse 91, temperature 98 F (36.7 C), temperature source Oral, resp. rate 20, height 5\' 10"  (1.778 m), weight 472 lb 14.2 oz (214.5 kg), SpO2 98.00%. Physical Exam  Constitutional:  Morbidly obese male no acute distress. BMI of 67.5.  HENT:  Head: Normocephalic and atraumatic.  Eyes: EOM are normal. No  scleral icterus.  GI: Soft. He exhibits no distension and no mass. There is no tenderness.  No hepatomegaly or splenomegaly although he is morbidly obese and the exam is difficult.  Genitourinary:  Large, approximately 15 cm firm left groin mass. No erythema.  Lymphadenopathy:    He has no cervical adenopathy.    Assessment/Plan: Enlarging left inguinal adenopathy in a patient with known low-grade follicular lymphoma. There is concern that this is transformed into a more aggressive type of lymphoma.  Plan: I discussed performing an open versus large core needle biopsy with multiple passes. This will need to be sent fresh to pathology as a likely it will need to be done early next week when the pathologists are here. He understands this. We'll check a PT/PTT on him.  Verline Kong J 11/01/2013, 10:04 AM

## 2013-11-01 NOTE — Progress Notes (Signed)
[facility] PROGRESS NOTE  Douglas [name] ZOX:[id number] DOB: [birth year] DOA: 10/30/2013 PCP: Alice Reichert, MD  Brief narrative: 57 y.o. male with known history of NHL who presents to the [facility] ED 10/30/213 with worsening discomfort from a slowly enlarging left medial thigh mass for the past 2 years. Pt reported increasing Discomfort at the area but no fever or chills. No open wounds. CT abdomen revealed left thigh mass likely related to NHL as well as surrounding cellulitic changes.   Assessment/Plan:   Principal Problem:  Mass of left thigh  - based on CT abd findings, likely related to history of NHL  - due to findings of possible surrounding cellulitic changes will start clinda  - appreciate surgery consult   Active Problems:  NHL (non-Hodgkin's lymphoma)  - never had treatment for NHL: will consult onc for input  Cellulitis  - left medial thigh - started clindamycin  HTN (hypertension)  - continue Hctz  Dyslipidemia  - continue atorvastatin  Anemia of chronic disease  - likely related to history of NHL  - hemoglobin stable  - no indications for transfusion  Morbid obesity  - nutrition consulted   Code Status: full code  Family Communication: no family at the bedside  Disposition Plan: home once stable   Consultants:  Surgery Procedures:  None  Antibiotics:  Clindamycin 10/31/2013 -->   [name], MD  [facility] Pager [phone]  If 7PM-7AM, please contact night-coverage [email or web address] Password [facility] 11/01/2013, 6:36 AM   LOS: 2 days    HPI/Subjective: No overnight events.   Objective: Filed Vitals:   10/31/13 0445 10/31/13 0507 10/31/13 1501 10/31/13 2200  BP: 164/95  121/57 118/71  Pulse: 98  78 81  Temp: 98.7 F (37.1 C)  98.1 F (36.7 C) 99.9 F (37.7 C)  TempSrc: Oral  Oral Oral  Resp: 20  18 16   Height:  5\' 10"  (1.778 m)    Weight:  214.5 kg (472 lb 14.2 oz)    SpO2: 98%  99% 97%    Intake/Output Summary (Last  24 hours) at 11/01/13 0636 Last data filed at 10/31/13 1837  Gross per 24 hour  Intake    480 ml  Output      0 ml  Net    480 ml    Exam:  General: Pt is alert, follows commands appropriately, not in acute distress  Cardiovascular: Regular rate and rhythm, S1/S2 appreciated  Respiratory: Clear to auscultation bilaterally, no wheezing, no crackles, no rhonchi  Abdomen: Soft, non tender, non distended, bowel sounds present, no guarding  Extremities: morbidly obese extremities; left medial thigh mass bulging out with no drainage, some tenderness on palpation, not fluctuating  Neuro: Grossly nonfocal   Data Reviewed: Basic Metabolic Panel:  Recent Labs Lab 10/30/13 1709  NA 136  K 3.7  CL 96  CO2 28  GLUCOSE 145*  BUN 12  CREATININE 0.83  CALCIUM 9.7   Liver Function Tests:  Recent Labs Lab 10/30/13 1709  AST 18  ALT 8  ALKPHOS 86  BILITOT 0.5  PROT 8.6*  ALBUMIN 3.3*   No results found for this basename: LIPASE, AMYLASE,  in the last 168 hours No results found for this basename: AMMONIA,  in the last 168 hours CBC:  Recent Labs Lab 10/30/13 1709  WBC 6.5  NEUTROABS 4.6  HGB 11.2*  HCT 35.1*  MCV 87.5  PLT 295   Cardiac Enzymes: No results found for this basename: CKTOTAL, CKMB, CKMBINDEX, TROPONINI,  in  the last 168 hours BNP: No components found with this basename: POCBNP,  CBG:  Recent Labs Lab 10/31/13 0742 10/31/13 1203 10/31/13 1746 10/31/13 2156  GLUCAP 127* 119* 124* 146*    No results found for this or any previous visit (from the past 240 hour(s)).   Studies: Ct Abdomen Pelvis W Contrast 10/31/2013    IMPRESSION: 6 x 15 x 17 mm bulky lesion in the left groin, suspected conglomerate lymph node mass in this patient with non-Hodgkin's lymphoma. Moderate surrounding cellulitic change. Correlate with prior biopsy and/or repeat tissue sampling recommended.      Scheduled Meds: . aspirin EC  81 mg Oral Daily  . atorvastatin  20 mg Oral  q1800  . clindamycin (CLEOCIN)   300 mg Intravenous Q8H  . hydrochlorothiazide  25 mg Oral Daily  . insulin aspart  0-15 Units Subcutaneous TID WC  . irbesartan  300 mg Oral Daily   Continuous Infusions: . sodium chloride Stopped (10/30/13 2114)

## 2013-11-01 NOTE — Plan of Care (Signed)
Problem: Food- and Nutrition-Related Knowledge Deficit (NB-1.1) Goal: Nutrition education Formal process to instruct or train a patient/client in a skill or to impart knowledge to help patients/clients voluntarily manage or modify food choices and eating behavior to maintain or improve health. Outcome: Completed/Met Date Met:  11/01/13  RD consulted for nutrition education regarding weight loss.  Body mass index is 67.85 kg/(m^2). Pt meets criteria for morbid obesity based on current BMI.  RD provided "Weight Loss Tips" handout from the [employer]. Emphasized the importance of serving sizes and provided examples of correct portions of common foods. Discussed importance of controlled and consistent intake throughout the day. Provided examples of ways to balance meals/snacks and encouraged intake of high-fiber, whole grain complex carbohydrates. Emphasized the importance of hydration with calorie-free beverages and limiting sugar-sweetened beverages. Encouraged pt to discuss physical activity options with physician. Teach back method used.  Pt reports that he travels for work often and eats fast food because it is quick and convenient. RD spoke to pt about ways to eat out and still lose weight. Pt was also taught to use the Plate Method. He was interested and said that he tried a diet once and lost about 20-25 lbs, but then "fell off the wagon." He said that he will work on making changes again.   Expect good compliance.  Current diet order is carb modified, patient is consuming approximately 100% of meals at this time. Labs and medications reviewed. No further nutrition interventions warranted at this time. RD contact information provided. If additional nutrition issues arise, please re-consult RD.  Ebbie Latus RD, LDN

## 2013-11-02 ENCOUNTER — Inpatient Hospital Stay (HOSPITAL_COMMUNITY): Payer: BC Managed Care – PPO

## 2013-11-02 HISTORY — PX: OTHER SURGICAL HISTORY: SHX169

## 2013-11-02 LAB — BASIC METABOLIC PANEL
BUN: 13 mg/dL (ref 6–23)
CO2: 27 mEq/L (ref 19–32)
Calcium: 9.2 mg/dL (ref 8.4–10.5)
Chloride: 95 mEq/L — ABNORMAL LOW (ref 96–112)
Creatinine, Ser: 0.86 mg/dL (ref 0.50–1.35)
Glucose, Bld: 132 mg/dL — ABNORMAL HIGH (ref 70–99)
Sodium: 133 mEq/L — ABNORMAL LOW (ref 135–145)

## 2013-11-02 LAB — CBC
HCT: 32.1 % — ABNORMAL LOW (ref 39.0–52.0)
Hemoglobin: 10.6 g/dL — ABNORMAL LOW (ref 13.0–17.0)
MCH: 28.3 pg (ref 26.0–34.0)
MCV: 85.6 fL (ref 78.0–100.0)
RBC: 3.75 MIL/uL — ABNORMAL LOW (ref 4.22–5.81)
WBC: 6.7 10*3/uL (ref 4.0–10.5)

## 2013-11-02 LAB — GLUCOSE, CAPILLARY
Glucose-Capillary: 120 mg/dL — ABNORMAL HIGH (ref 70–99)
Glucose-Capillary: 92 mg/dL (ref 70–99)
Glucose-Capillary: 172 mg/dL — ABNORMAL HIGH (ref 70–99)

## 2013-11-02 MED ORDER — FENTANYL CITRATE 0.05 MG/ML IJ SOLN
INTRAMUSCULAR | Status: AC
  Filled 2013-11-02: qty 4

## 2013-11-02 MED ORDER — MIDAZOLAM HCL 2 MG/2ML IJ SOLN
INTRAMUSCULAR | Status: AC | PRN
  Administered 2013-11-02: 2 mg via INTRAVENOUS

## 2013-11-02 MED ORDER — CLINDAMYCIN HCL 300 MG PO CAPS
300.0000 mg | ORAL_CAPSULE | Freq: Three times a day (TID) | ORAL | Status: DC
  Administered 2013-11-02 – 2013-11-03 (×3): 300 mg via ORAL
  Filled 2013-11-02 (×6): qty 1

## 2013-11-02 MED ORDER — FENTANYL CITRATE 0.05 MG/ML IJ SOLN
INTRAMUSCULAR | Status: AC | PRN
  Administered 2013-11-02: 100 ug via INTRAVENOUS

## 2013-11-02 MED ORDER — MIDAZOLAM HCL 2 MG/2ML IJ SOLN
INTRAMUSCULAR | Status: AC
  Filled 2013-11-02: qty 4

## 2013-11-02 MED ORDER — HYDROCOD POLST-CHLORPHEN POLST 10-8 MG/5ML PO LQCR
5.0000 mL | Freq: Two times a day (BID) | ORAL | Status: DC
  Administered 2013-11-02 – 2013-11-03 (×3): 5 mL via ORAL
  Filled 2013-11-02 (×3): qty 5

## 2013-11-02 NOTE — Procedures (Signed)
US guided core biopsies of left groin mass.   6 cores obtained.  No immediate complication.

## 2013-11-02 NOTE — Progress Notes (Signed)
[facility] PROGRESS NOTE  Douglas [name] AVW:[id number] DOB: 1956/02/24 DOA: 10/30/2013 PCP: Alice Reichert, MD  Brief narrative: 57 y.o. male with known history of NHL who presents to the [facility] ED 10/30/213 with worsening discomfort from a slowly enlarging left medial thigh mass for the past 2 years. Pt reported increasing Discomfort at the area but no fever or chills. No open wounds. CT abdomen revealed left thigh mass likely related to NHL as well as surrounding cellulitic changes.   Assessment/Plan:   Principal Problem:  Mass of left thigh  - based on CT abd findings, likely related to history of NHL  - due to findings of possible surrounding cellulitic changes; will change to PO clindamycin today  - appreciate surgery following; plan for excisional biopsy today  Active Problems:  NHL (non-Hodgkin's lymphoma)  - never had treatment for NHL - Per oncology, recommendation was for a surgery consultation for excisional biopsy Cellulitis  - left medial thigh/ groin area - started clindamycin  HTN (hypertension)  - continue Hctz  - BP 116/62 Dyslipidemia  - continue atorvastatin  Anemia of chronic disease  - likely related to history of NHL  - hemoglobin stable at 10.6  - no indications for transfusion  Morbid obesity  - nutrition consulted   Code Status: full code  Family Communication: no family at the bedside  Disposition Plan: home once stable   Consultants:  Surgery Procedures:  Plan for excisional biopsy today Antibiotics:  Clindamycin 10/31/2013 -->    [name], MD  [facility] Pager [phone]  If 7PM-7AM, please contact night-coverage [email or web address] Password [email or web address] 11/02/2013, 10:26 AM   LOS: 3 days    HPI/Subjective: No overnight events.   Objective: Filed Vitals:   11/01/13 0654 11/01/13 1501 11/01/13 2200 11/02/13 0538  BP: 116/60 134/53 125/67 116/62  Pulse: 91 80 76 71  Temp: 98 F (36.7 C) 98.5 F (36.9 C) 98.7 F  (37.1 C) 98.6 F (37 C)  TempSrc: Oral Oral Oral Axillary  Resp: 20 18 20 20   Height:      Weight:      SpO2: 98% 100% 97% 100%    Intake/Output Summary (Last 24 hours) at 11/02/13 1026 Last data filed at 11/02/13 0831  Gross per 24 hour  Intake    720 ml  Output      0 ml  Net    720 ml    Exam:   General:  Pt is alert, follows commands appropriately, morbidly obese  Cardiovascular: Regular rate and rhythm, S1/S2 appreciated  Respiratory: Clear to auscultation bilaterally, no wheezing, no crackles, no rhonchi  Abdomen: Soft, non tender, non distended, bowel sounds present, no guarding  Extremities: No edema, pulses DP and PT palpable bilaterally; left groin (left upper medial thigh mass)  Neuro: Grossly nonfocal  Data Reviewed: Basic Metabolic Panel:  Recent Labs Lab 10/30/13 1709 11/02/13 0411  NA 136 133*  K 3.7 4.8  CL 96 95*  CO2 28 27  GLUCOSE 145* 132*  BUN 12 13  CREATININE 0.83 0.86  CALCIUM 9.7 9.2   Liver Function Tests:  Recent Labs Lab 10/30/13 1709  AST 18  ALT 8  ALKPHOS 86  BILITOT 0.5  PROT 8.6*  ALBUMIN 3.3*   No results found for this basename: LIPASE, AMYLASE,  in the last 168 hours No results found for this basename: AMMONIA,  in the last 168 hours CBC:  Recent Labs Lab 10/30/13 1709 11/02/13 0411  WBC 6.5 6.7  NEUTROABS 4.6  --  HGB 11.2* 10.6*  HCT 35.1* 32.1*  MCV 87.5 85.6  PLT 295 270   Cardiac Enzymes: No results found for this basename: CKTOTAL, CKMB, CKMBINDEX, TROPONINI,  in the last 168 hours BNP: No components found with this basename: POCBNP,  CBG:  Recent Labs Lab 11/01/13 0750 11/01/13 1203 11/01/13 1657 11/01/13 2154 11/02/13 0726  GLUCAP 125* 130* 106* 141* 122*    No results found for this or any previous visit (from the past 240 hour(s)).   Studies: No results found.  Scheduled Meds: . aspirin EC  81 mg Oral Daily  . atorvastatin  20 mg Oral q1800  . chlorpheniramine-HYDROcodone   5 mL Oral Q12H  . clindamycin (CLEOCIN) IV  300 mg Intravenous Q8H  . hydrochlorothiazide  25 mg Oral Daily  . insulin aspart  0-15 Units Subcutaneous TID WC  . irbesartan  300 mg Oral Daily   Continuous Infusions: . sodium chloride Stopped (10/30/13 2114)

## 2013-11-02 NOTE — Progress Notes (Signed)
General Surgery Note  LOS: 3 days  POD -     Assessment/Plan: 1.  Left groin mass/left leg lymphedema  CT scan - 10/31/2013 - 6 x 15 x 17 cm mass.   On Cleocin  Despite the size of the mass on CT scan, because of the patient's size and lymphedema, this area is not easy to feel.  Because of the size of his leg and the lymphedema,  I think that it would be best for the patient to have interventional radiology do multiple core biopsies to try to establish a diagnosis.  If they are unsuccessful at getting a diagnosis, then I could do an open biopsy, but this will carry a significant morbidity.  Tresa Endo has put an order for the biopsies)  2.  History of non Hodgkin's lymphoma  3.  Morbid obesity  2. DVT prophylaxis - PAS stockings.   Principal Problem:   Mass of left thigh Active Problems:   NHL (non-Hodgkin's lymphoma)   Cellulitis   HTN (hypertension)   Dyslipidemia   Anemia of chronic disease   Morbid obesity   Non Hodgkin's lymphoma   Subjective:  Alert. Doing well. Minimal left groin pain.  Objective:   Filed Vitals:   11/02/13 0538  BP: 116/62  Pulse: 71  Temp: 98.6 F (37 C)  Resp: 20     Intake/Output from previous day:  12/14 0701 - 12/15 0700 In: 720 [P.O.:720] Out: -   Intake/Output this shift:      Physical Exam:   General: Obese AA M who is alert and oriented.    HEENT: Normal. Pupils equal.   Abdomen: Soft   Left leg - Significant left leg lymphedema with vague mass in left groin.  He has an old scar from a prior left groin biopsy 12/2007 (by Dr. Loree Fee).  But size of leg and lymphedema make limit how well left groin mass can be felt.   Lab Results:    Recent Labs  10/30/13 1709 11/02/13 0411  WBC 6.5 6.7  HGB 11.2* 10.6*  HCT 35.1* 32.1*  PLT 295 270    BMET   Recent Labs  10/30/13 1709 11/02/13 0411  NA 136 133*  K 3.7 4.8  CL 96 95*  CO2 28 27  GLUCOSE 145* 132*  BUN 12 13  CREATININE 0.83 0.86  CALCIUM 9.7 9.2     PT/INR   Recent Labs  11/01/13 1135  LABPROT 13.9  INR 1.09    ABG  No results found for this basename: PHART, PCO2, PO2, HCO3,  in the last 72 hours   Studies/Results:  No results found.   Anti-infectives:   Anti-infectives   Start     Dose/Rate Route Frequency Ordered Stop   10/31/13 1000  clindamycin (CLEOCIN) IVPB 300 mg     300 mg 100 mL/hr over 30 Minutes Intravenous Every 8 hours 10/31/13 0856        Ovidio Kin, MD, FACS Pager: [phone] [facility] Office: [phone] 11/02/2013

## 2013-11-02 NOTE — Progress Notes (Signed)
Patient ID: Douglas [name], male   DOB: [birth year], 57 y.o.   MRN: [id number] Request received from [facility] for US guided left inguinal lymph node biopsy in pt with hx of NHL (2009) and recent CT revealing bulky 1.7 cm mass left groin compatible with lymph node conglomerate. Imaging studies were reviewed by Dr. Lowella Dandy. Additional PMH as below. Exam: pt awake/alert; chest- CTA bilat ant; heart- RRR; abd - morbidly obese, soft,+BS,NT; mildly tender left groin/nodal mass present; ext-FROM.   Filed Vitals:   11/01/13 0654 11/01/13 1501 11/01/13 2200 11/02/13 0538  BP: 116/60 134/53 125/67 116/62  Pulse: 91 80 76 71  Temp: 98 F (36.7 C) 98.5 F (36.9 C) 98.7 F (37.1 C) 98.6 F (37 C)  TempSrc: Oral Oral Oral Axillary  Resp: 20 18 20 20   Height:      Weight:      SpO2: 98% 100% 97% 100%   Past Medical History  Diagnosis Date  . Non Hodgkin's lymphoma   . Diabetes mellitus without complication   . Hypertension   . Sleep apnea    Past Surgical History  Procedure Laterality Date  . Biopsy for lymphoma     Ct Abdomen Pelvis W Contrast  10/31/2013   CLINICAL DATA:  Left groin pain.  Non-Hodgkin's lymphoma.  EXAM: CT ABDOMEN AND PELVIS WITH CONTRAST  TECHNIQUE: Multidetector CT imaging of the abdomen and pelvis was performed using the standard protocol following bolus administration of intravenous contrast.  CONTRAST:  OMNIPAQUE IOHEXOL 300 MG/ML  SOLN  COMPARISON:  CT abdomen and pelvis 02/06/2008.  FINDINGS: BODY WALL: Morbid obesity. There are portions of the patient's body which could not be included within the scan due to his size.  In the left groin there is a bulky mass with approximate cross-section of 6 x 15 x 17 cm. There is infra inguinal extension into the upper thigh. Moderate surrounding cellulitic change. Surgical clips are seen superficial to this lesion. No involvement of the scrotum. No air or central necrosis. The findings are most consistent with bulky recurrence of tumor  with conglomerate lymph node mass. Recommend correlation with prior surgical pathology and/or repeat tissue sampling as clinically indicated. This mass was not present previously on the scan of 2009.  LOWER CHEST: Unremarkable.  ABDOMEN/PELVIS:  Liver: No focal abnormality.  Biliary: No evidence of biliary obstruction or stone.  Pancreas: Unremarkable.  Spleen: Unremarkable.  Adrenals: Unremarkable.  Kidneys and ureters: No hydronephrosis or stone.  Bladder: Unremarkable.  Reproductive: Unremarkable.  Bowel: No obstruction. Normal appendix.  Retroperitoneum: No mass or adenopathy.  Peritoneum: No free fluid or gas.  Vascular: No acute abnormality.  OSSEOUS: No acute abnormalities.  IMPRESSION: 6 x 15 x 17 mm bulky lesion in the left groin, suspected conglomerate lymph node mass in this patient with non-Hodgkin's lymphoma. Moderate surrounding cellulitic change. Correlate with prior biopsy and/or repeat tissue sampling recommended.   Electronically Signed   By: Davonna Belling M.D.   On: 10/31/2013 00:44  Results for orders placed during the hospital encounter of 10/30/13  CBC WITH DIFFERENTIAL      Result Value Range   WBC 6.5  4.0 - 10.5 K/uL   RBC 4.01 (*) 4.22 - 5.81 MIL/uL   Hemoglobin 11.2 (*) 13.0 - 17.0 g/dL   HCT 40.9 (*) 81.1 - 91.4 %   MCV 87.5  78.0 - 100.0 fL   MCH 27.9  26.0 - 34.0 pg   MCHC 31.9  30.0 - 36.0 g/dL  RDW 13.5  11.5 - 15.5 %   Platelets 295  150 - 400 K/uL   Neutrophils Relative % 70  43 - 77 %   Neutro Abs 4.6  1.7 - 7.7 K/uL   Lymphocytes Relative 14  12 - 46 %   Lymphs Abs 0.9  0.7 - 4.0 K/uL   Monocytes Relative 8  3 - 12 %   Monocytes Absolute 0.5  0.1 - 1.0 K/uL   Eosinophils Relative 7 (*) 0 - 5 %   Eosinophils Absolute 0.5  0.0 - 0.7 K/uL   Basophils Relative 0  0 - 1 %   Basophils Absolute 0.0  0.0 - 0.1 K/uL  COMPREHENSIVE METABOLIC PANEL      Result Value Range   Sodium 136  135 - 145 mEq/L   Potassium 3.7  3.5 - 5.1 mEq/L   Chloride 96  96 - 112 mEq/L    CO2 28  19 - 32 mEq/L   Glucose, Bld 145 (*) 70 - 99 mg/dL   BUN 12  6 - 23 mg/dL   Creatinine, Ser 0.98  0.50 - 1.35 mg/dL   Calcium 9.7  8.4 - 11.9 mg/dL   Total Protein 8.6 (*) 6.0 - 8.3 g/dL   Albumin 3.3 (*) 3.5 - 5.2 g/dL   AST 18  0 - 37 U/L   ALT 8  0 - 53 U/L   Alkaline Phosphatase 86  39 - 117 U/L   Total Bilirubin 0.5  0.3 - 1.2 mg/dL   GFR calc non Af Amer >90  >90 mL/min   GFR calc Af Amer >90  >90 mL/min  URINALYSIS, ROUTINE W REFLEX MICROSCOPIC      Result Value Range   Color, Urine YELLOW  YELLOW   APPearance CLEAR  CLEAR   Specific Gravity, Urine >1.030 (*) 1.005 - 1.030   pH 5.5  5.0 - 8.0   Glucose, UA NEGATIVE  NEGATIVE mg/dL   Hgb urine dipstick TRACE (*) NEGATIVE   Bilirubin Urine NEGATIVE  NEGATIVE   Ketones, ur NEGATIVE  NEGATIVE mg/dL   Protein, ur NEGATIVE  NEGATIVE mg/dL   Urobilinogen, UA 0.2  0.0 - 1.0 mg/dL   Nitrite NEGATIVE  NEGATIVE   Leukocytes, UA TRACE (*) NEGATIVE  URINE MICROSCOPIC-ADD ON      Result Value Range   Squamous Epithelial / LPF RARE  RARE   WBC, UA 0-2  <3 WBC/hpf   Bacteria, UA RARE  RARE  GLUCOSE, CAPILLARY      Result Value Range   Glucose-Capillary 127 (*) 70 - 99 mg/dL  GLUCOSE, CAPILLARY      Result Value Range   Glucose-Capillary 119 (*) 70 - 99 mg/dL  GLUCOSE, CAPILLARY      Result Value Range   Glucose-Capillary 124 (*) 70 - 99 mg/dL  GLUCOSE, CAPILLARY      Result Value Range   Glucose-Capillary 146 (*) 70 - 99 mg/dL   Comment 1 Notify RN    GLUCOSE, CAPILLARY      Result Value Range   Glucose-Capillary 125 (*) 70 - 99 mg/dL  PROTIME-INR      Result Value Range   Prothrombin Time 13.9  11.6 - 15.2 seconds   INR 1.09  0.00 - 1.49  APTT      Result Value Range   aPTT 25  24 - 37 seconds  GLUCOSE, CAPILLARY      Result Value Range   Glucose-Capillary 130 (*) 70 - 99 mg/dL  CBC      Result Value Range   WBC 6.7  4.0 - 10.5 K/uL   RBC 3.75 (*) 4.22 - 5.81 MIL/uL   Hemoglobin 10.6 (*) 13.0 - 17.0  g/dL   HCT 16.1 (*) 09.6 - 04.5 %   MCV 85.6  78.0 - 100.0 fL   MCH 28.3  26.0 - 34.0 pg   MCHC 33.0  30.0 - 36.0 g/dL   RDW 40.9  81.1 - 91.4 %   Platelets 270  150 - 400 K/uL  BASIC METABOLIC PANEL      Result Value Range   Sodium 133 (*) 135 - 145 mEq/L   Potassium 4.8  3.5 - 5.1 mEq/L   Chloride 95 (*) 96 - 112 mEq/L   CO2 27  19 - 32 mEq/L   Glucose, Bld 132 (*) 70 - 99 mg/dL   BUN 13  6 - 23 mg/dL   Creatinine, Ser 7.82  0.50 - 1.35 mg/dL   Calcium 9.2  8.4 - 95.6 mg/dL   GFR calc non Af Amer >90  >90 mL/min   GFR calc Af Amer >90  >90 mL/min  GLUCOSE, CAPILLARY      Result Value Range   Glucose-Capillary 106 (*) 70 - 99 mg/dL  GLUCOSE, CAPILLARY      Result Value Range   Glucose-Capillary 141 (*) 70 - 99 mg/dL   Comment 1 Notify RN    GLUCOSE, CAPILLARY      Result Value Range   Glucose-Capillary 122 (*) 70 - 99 mg/dL   Comment 1 Documented in Chart     Comment 2 Notify RN     A/P: Pt with hx of NHL 2009; now with bulky adenopathy left groin. Plan is for US guided core biopsy of left inguinal adenopathy today per [facility] request. Details/risks of procedure d/w pt with his understanding and consent.

## 2013-11-03 ENCOUNTER — Telehealth: Payer: Self-pay | Admitting: Internal Medicine

## 2013-11-03 DIAGNOSIS — I10 Essential (primary) hypertension: Secondary | ICD-10-CM

## 2013-11-03 LAB — GLUCOSE, CAPILLARY: Glucose-Capillary: 124 mg/dL — ABNORMAL HIGH (ref 70–99)

## 2013-11-03 MED ORDER — GUAIFENESIN 100 MG/5ML PO SOLN: 200.0000 mg | ORAL | Status: DC | PRN

## 2013-11-03 MED ORDER — IBUPROFEN 400 MG PO TABS: 400.0000 mg | ORAL_TABLET | Freq: Four times a day (QID) | ORAL | Status: DC | PRN

## 2013-11-03 NOTE — Telephone Encounter (Signed)
S/w Dr. Elisabeth Pigeon and gve np appt 12/23 @ 11 w/Dr. Arbutus Ped. Pt will be d/c from hospital on 12/16 Dx- NHL Welcome packet mailed.

## 2013-11-03 NOTE — Discharge Summary (Signed)
Physician Discharge Summary  Douglas [name] XBJ:[id number] DOB: 07-16-1956 DOA: 10/30/2013  PCP: Alice Reichert, MD  Admit date: 10/30/2013 Discharge date: 11/03/2013  Recommendations for Outpatient Follow-up:  1. Please follow up with PCP in 1 week after the discharge 2. Please follow up with cancer center per scheduled appointment for follow up of biopsy results  3. No need for antibiotics at the time of discharge  Discharge Diagnoses:  Principal Problem:   Mass of left thigh Active Problems:   NHL (non-Hodgkin's lymphoma)   Cellulitis   HTN (hypertension)   Dyslipidemia   Anemia of chronic disease   Morbid obesity   Non Hodgkin's lymphoma    Discharge Condition: Medically stable for discharge home today  Diet recommendation: As tolerated, heart healthy diet  History of present illness:  57 y.o. male with known history of NHL who presents to the [facility] ED 10/30/213 with worsening discomfort from a slowly enlarging left medial thigh mass for the past 2 years. Pt reported increasing Discomfort at the area but no fever or chills. No open wounds. CT abdomen revealed left thigh mass likely related to NHL as well as surrounding cellulitic changes.   Assessment/Plan:   Principal Problem:  Mass of left thigh  - based on CT abd findings, likely related to history of NHL  - Patient was on clindamycin for 4 days. No need to continue antibiotics beyond today. - appreciate surgery following; her surgery interventional radiology to do biopsy. Patient will followup in cancer Center for biopsy results. Biopsy done 11/02/2013 Active Problems:  NHL (non-Hodgkin's lymphoma)  - never had treatment for NHL  - Biopsy done 11/02/2013, patient to followup in cancer Center for results Cellulitis  - left medial thigh/ groin area  - started clindamycin and no need to continue beyond the discharge HTN (hypertension)  - continue Hctz  - Continue avapro  Dyslipidemia  - continue  atorvastatin  Anemia of chronic disease  - likely related to history of NHL  - hemoglobin stable at 10.6  - no indications for transfusion  Morbid obesity  - nutrition consulted   Code Status: full code  Family Communication: no family at the bedside   Consultants:  Surgery Procedures:  Plan for excisional biopsy today Antibiotics:  Clindamycin 10/31/2013 -->11/03/2013    Signed:  [name], MD  [facility] 11/03/2013, 10:39 AM  Pager #: [phone]   Discharge Exam: Filed Vitals:   11/03/13 0554  BP: 108/55  Pulse: 83  Temp: 98.3 F (36.8 C)  Resp: 18   Filed Vitals:   11/02/13 1955 11/02/13 2055 11/03/13 0248 11/03/13 0554  BP:  154/68 154/87 108/55  Pulse: 85 120 88 83  Temp:  100.2 F (37.9 C) 99.1 F (37.3 C) 98.3 F (36.8 C)  TempSrc:  Oral Oral Oral  Resp: 17 21 18 18   Height:      Weight:      SpO2: 99% 94% 94% 96%    Exam:  General: Pt is alert, follows commands appropriately, morbidly obese  Cardiovascular: Regular rate and rhythm, S1/S2 appreciated  Respiratory: Clear to auscultation bilaterally, no wheezing, no crackles, no rhonchi  Abdomen: Soft, non tender, non distended, bowel sounds present, no guarding  Extremities: No edema, pulses DP and PT palpable bilaterally; left groin (left upper medial thigh mass)  Neuro: Grossly nonfocal   Discharge Instructions  Discharge Orders   Future Orders Complete By Expires   Call MD for:  difficulty breathing, headache or visual disturbances  As directed  Call MD for:  persistant dizziness or light-headedness  As directed    Call MD for:  persistant nausea and vomiting  As directed    Call MD for:  severe uncontrolled pain  As directed    Diet - low sodium heart healthy  As directed    Discharge instructions  As directed    Comments:     1. Please follow up with PCP in 1 week after the discharge 2. Please follow up with cancer center per scheduled appointment for follow up of  biopsy results  3. No need for antibiotics at the time of discharge   Increase activity slowly  As directed        Medication List         aspirin EC 81 MG tablet  Take 81 mg by mouth daily.     atorvastatin 20 MG tablet  Commonly known as:  LIPITOR  Take 20 mg by mouth daily.     guaiFENesin 100 MG/5ML Soln  Commonly known as:  ROBITUSSIN  Take 10 mLs (200 mg total) by mouth every 4 (four) hours as needed.     ibuprofen 400 MG tablet  Commonly known as:  ADVIL,MOTRIN  Take 1 tablet (400 mg total) by mouth every 6 (six) hours as needed for headache or moderate pain.     pioglitazone-metformin 15-500 MG per tablet  Commonly known as:  ACTOPLUS MET  Take 1 tablet by mouth 2 (two) times daily with a meal.     valsartan-hydrochlorothiazide 320-25 MG per tablet  Commonly known as:  DIOVAN-HCT  Take 1 tablet by mouth daily.           Follow-up Information   Follow up with Alice Reichert, MD. Schedule an appointment as soon as possible for a visit in 1 week.   Specialty:  Family Medicine   Contact information:   [address] [place] [address] [phone]       Follow up with Lajuana Matte., MD On 11/10/2013. (11 am)    Specialty:  Oncology   Contact information:   [address] [place] [address] [phone]        The results of significant diagnostics from this hospitalization (including imaging, microbiology, ancillary and laboratory) are listed below for reference.    Significant Diagnostic Studies: Ct Abdomen Pelvis W Contrast  10/31/2013   CLINICAL DATA:  Left groin pain.  Non-Hodgkin's lymphoma.  EXAM: CT ABDOMEN AND PELVIS WITH CONTRAST  TECHNIQUE: Multidetector CT imaging of the abdomen and pelvis was performed using the standard protocol following bolus administration of intravenous contrast.  CONTRAST:  OMNIPAQUE IOHEXOL 300 MG/ML  SOLN  COMPARISON:  CT abdomen and pelvis 02/06/2008.  FINDINGS: BODY WALL: Morbid obesity.  There are portions of the patient's body which could not be included within the scan due to his size.  In the left groin there is a bulky mass with approximate cross-section of 6 x 15 x 17 cm. There is infra inguinal extension into the upper thigh. Moderate surrounding cellulitic change. Surgical clips are seen superficial to this lesion. No involvement of the scrotum. No air or central necrosis. The findings are most consistent with bulky recurrence of tumor with conglomerate lymph node mass. Recommend correlation with prior surgical pathology and/or repeat tissue sampling as clinically indicated. This mass was not present previously on the scan of 2009.  LOWER CHEST: Unremarkable.  ABDOMEN/PELVIS:  Liver: No focal abnormality.  Biliary: No evidence of biliary obstruction or  stone.  Pancreas: Unremarkable.  Spleen: Unremarkable.  Adrenals: Unremarkable.  Kidneys and ureters: No hydronephrosis or stone.  Bladder: Unremarkable.  Reproductive: Unremarkable.  Bowel: No obstruction. Normal appendix.  Retroperitoneum: No mass or adenopathy.  Peritoneum: No free fluid or gas.  Vascular: No acute abnormality.  OSSEOUS: No acute abnormalities.  IMPRESSION: 6 x 15 x 17 mm bulky lesion in the left groin, suspected conglomerate lymph node mass in this patient with non-Hodgkin's lymphoma. Moderate surrounding cellulitic change. Correlate with prior biopsy and/or repeat tissue sampling recommended.   Electronically Signed   By: Davonna Belling M.D.   On: 10/31/2013 00:44   US Biopsy  11/02/2013   CLINICAL DATA:  History of non-Hodgkin's lymphoma. The patient has a large left groin mass.  EXAM: ULTRASOUND-GUIDED BIOPSY OF LEFT GROIN MASS  Physician: Rachelle Hora. Henn, MD  MEDICATIONS: Versed 2 mg, fentanyl 100 mcg. A radiology nurse monitored the patient for moderate sedation.  ANESTHESIA/SEDATION: Moderate sedation time: 25 min  FLUOROSCOPY TIME:  None  PROCEDURE: The procedure was explained to the patient. The risks and benefits of  the procedure were discussed and the patient's questions were addressed. Informed consent was obtained from the patient. The left upper thigh and groin region was evaluated with ultrasound. There is ill-defined abnormal soft tissue along the medial aspect of the left groin which corresponds with the recent CT findings. The left upper groin was prepped and draped in sterile fashion. Skin was anesthetized with 1% lidocaine. A 17 gauge coaxial needle was directed into the soft tissue lesion. A total of 6 core biopsies were obtained with an 18 gauge device. Samples were placed in saline. 17 gauge needle was removed without complication.  COMPLICATIONS: None  FINDINGS: Ill-defined soft tissue mass involving the medial left upper groin.  IMPRESSION: Ultrasound-guided core biopsies of the left groin mass.   Electronically Signed   By: Richarda Overlie M.D.   On: 11/02/2013 17:49    Microbiology: No results found for this or any previous visit (from the past 240 hour(s)).   Labs: Basic Metabolic Panel:  Recent Labs Lab 10/30/13 1709 11/02/13 0411  NA 136 133*  K 3.7 4.8  CL 96 95*  CO2 28 27  GLUCOSE 145* 132*  BUN 12 13  CREATININE 0.83 0.86  CALCIUM 9.7 9.2   Liver Function Tests:  Recent Labs Lab 10/30/13 1709  AST 18  ALT 8  ALKPHOS 86  BILITOT 0.5  PROT 8.6*  ALBUMIN 3.3*   No results found for this basename: LIPASE, AMYLASE,  in the last 168 hours No results found for this basename: AMMONIA,  in the last 168 hours CBC:  Recent Labs Lab 10/30/13 1709 11/02/13 0411  WBC 6.5 6.7  NEUTROABS 4.6  --   HGB 11.2* 10.6*  HCT 35.1* 32.1*  MCV 87.5 85.6  PLT 295 270   Cardiac Enzymes: No results found for this basename: CKTOTAL, CKMB, CKMBINDEX, TROPONINI,  in the last 168 hours BNP: BNP (last 3 results) No results found for this basename: PROBNP,  in the last 8760 hours CBG:  Recent Labs Lab 11/02/13 0726 11/02/13 1159 11/02/13 1605 11/02/13 2106 11/03/13 0734  GLUCAP  122* 120* 92 172* 124*    Time coordinating discharge: Over 30 minutes

## 2013-11-03 NOTE — Progress Notes (Signed)
Patient ID: Douglas [name], male   DOB: Jan 14, 1956, 57 y.o.   MRN: [id number] Will await pathology results from core needle biopsies done yesterday by IR to determine if further biopsies are needed by [place].  OSBORNE,KELLY E 11/03/2013 8:27 AM  Agree.  Ovidio Kin, MD, [facility] Pager: [phone] Office phone:  [phone]

## 2013-11-03 NOTE — Care Management Note (Signed)
   CARE MANAGEMENT NOTE 11/03/2013  Patient:  Douglas [name], Douglas [name]   Account Number:  [id number]  Date Initiated:  10/31/2013  Documentation initiated by:  Jeramey Lanuza  Subjective/Objective Assessment:   57 yo male admitted with non hodgkins lymphoma.     Action/Plan:   Home when stable   Anticipated DC Date:     Anticipated DC Plan:        DC Planning Services  CM consult      Choice offered to / List presented to:  NA   DME arranged  NA      DME agency  NA     HH arranged  NA      HH agency  NA   Status of service:  In process, will continue to follow Medicare Important Message given?   (If response is "NO", the following Medicare IM given date fields will be blank) Date Medicare IM given:   Date Additional Medicare IM given:    Discharge Disposition:    Per UR Regulation:  Reviewed for med. necessity/level of care/duration of stay  If discussed at Long Length of Stay Meetings, dates discussed:    Comments:  11/03/13 Roland Earl [phone] Pt discharged home with self-care. No needs idnetified at this time.  10/31/13 Evangelynn Lochridge,RN,MSN [phone] UR complete

## 2013-11-03 NOTE — Telephone Encounter (Signed)
C/D 11/03/13 for appt. 11/10/13

## 2013-11-10 ENCOUNTER — Ambulatory Visit (HOSPITAL_BASED_OUTPATIENT_CLINIC_OR_DEPARTMENT_OTHER): Payer: BC Managed Care – PPO | Admitting: Internal Medicine

## 2013-11-10 ENCOUNTER — Other Ambulatory Visit: Payer: Self-pay | Admitting: Internal Medicine

## 2013-11-10 ENCOUNTER — Other Ambulatory Visit: Payer: Self-pay | Admitting: Medical Oncology

## 2013-11-10 ENCOUNTER — Telehealth: Payer: Self-pay | Admitting: Internal Medicine

## 2013-11-10 ENCOUNTER — Encounter: Payer: Self-pay | Admitting: Internal Medicine

## 2013-11-10 ENCOUNTER — Other Ambulatory Visit (HOSPITAL_BASED_OUTPATIENT_CLINIC_OR_DEPARTMENT_OTHER): Payer: BC Managed Care – PPO

## 2013-11-10 ENCOUNTER — Ambulatory Visit: Payer: BC Managed Care – PPO

## 2013-11-10 VITALS — BP 154/82 | HR 128 | Temp 98.3°F | Resp 20 | Ht 70.0 in | Wt >= 6400 oz

## 2013-11-10 DIAGNOSIS — C8590 Non-Hodgkin lymphoma, unspecified, unspecified site: Secondary | ICD-10-CM

## 2013-11-10 DIAGNOSIS — C8589 Other specified types of non-Hodgkin lymphoma, extranodal and solid organ sites: Secondary | ICD-10-CM

## 2013-11-10 DIAGNOSIS — M7989 Other specified soft tissue disorders: Secondary | ICD-10-CM

## 2013-11-10 LAB — CBC WITH DIFFERENTIAL/PLATELET
Basophils Absolute: 0 10*3/uL (ref 0.0–0.1)
Eosinophils Absolute: 0.5 10*3/uL (ref 0.0–0.5)
HGB: 10.9 g/dL — ABNORMAL LOW (ref 13.0–17.1)
MCH: 28.6 pg (ref 27.2–33.4)
MCHC: 32.8 g/dL (ref 32.0–36.0)
MCV: 87.2 fL (ref 79.3–98.0)
MONO%: 11.8 % (ref 0.0–14.0)
NEUT#: 5.6 10*3/uL (ref 1.5–6.5)
Platelets: 292 10*3/uL (ref 140–400)
RDW: 13.8 % (ref 11.0–14.6)

## 2013-11-10 LAB — URIC ACID (CC13): Uric Acid, Serum: 6.7 mg/dl (ref 2.6–7.4)

## 2013-11-10 LAB — COMPREHENSIVE METABOLIC PANEL (CC13)
ALT: 12 U/L (ref 0–55)
Albumin: 2.9 g/dL — ABNORMAL LOW (ref 3.5–5.0)
Alkaline Phosphatase: 79 U/L (ref 40–150)
Anion Gap: 13 mEq/L — ABNORMAL HIGH (ref 3–11)
BUN: 15.8 mg/dL (ref 7.0–26.0)
Calcium: 10.2 mg/dL (ref 8.4–10.4)
Glucose: 210 mg/dl — ABNORMAL HIGH (ref 70–140)
Potassium: 4.2 mEq/L (ref 3.5–5.1)

## 2013-11-10 LAB — LACTATE DEHYDROGENASE (CC13): LDH: 730 U/L — ABNORMAL HIGH (ref 125–245)

## 2013-11-10 NOTE — Progress Notes (Signed)
[facility] [phone]   Fax:([phone]) [phone]  CONSULT NOTE  REFERRING PHYSICIAN: Dr. Butch Penny  REASON FOR CONSULTATION:  57 years old African American male with progressive low-grade non-Hodgkin lymphoma  HPI Douglas [name] is a 57 y.o. male with past medical history significant for diabetes mellitus, hypertension, sleep apnea, dyslipidemia as well as history of low-grade non-Hodgkin lymphoma diagnosed in 2009. The patient mentions that in early January 2009 he was noted to have a lump in the left groin area. He had ultrasound guided core biopsy at that time on 12/09/2007 and the final pathology Case #: [id number] showed atypical lymphoproliferative process. The cores show lymphoid tissue which is composed predominantly of small lymphocytes with round to slightly irregular nuclei. There are also occasional areas of fibrosis. Immunohistochemistry is performed and shows an abundance of T-cells including positivity with CD3 and CD45RO. There are also abundant B-cells present including areas with diffuse B-cell staining with CD20 and CD79a. No significant staining for CD10 is identified. The findings are atypical and B-cell Non-Hodgkin' s lymphoma cannot be excluded. 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 Sections of the left groin lymph node show effacement of the lymph node architecture by numerous ill-defined follicular structures displaying back to back rearrangement and a homogeneous composition of mainly small angulated lymphoid cells with dense chromatin and small to inconspicuous nucleoli. This is admixed with a minor component of large lymphoid cells counted at more than 5 and less than 15/HPF. The involvement is predominantly follicular with no definite diffuse areas.  Flow cytometric analysis shows a monoclonal, kappa restricted B-cell population expressing pan B-cell antigens. The overall features are most compatible with low grade follicular lymphoma (mixed cell type, grade 2) with a predominant follicular pattern. The flow cytometric analysis showed a monoclonal, kappa restricted B-cell population expressing pan B cell antigens including CD20 consistent with non-Hodgkin's B-cell lymphoma.  The patient was initially seen at [facility] for consideration of treatment and we discussed with him clinical trial at that time. The patient declined that treatment options. He was seen by Dr. Mariel Sleet at [facility]. He was followed by observation and repeat blood work and last visit was in 2010.  He was lost to followup since that time.   the patient was recently admitted to [facility] with worsening discomfort and a slowly enlarging mass in the left groin area as well as the swelling of the left lower extremity. He was transferred to [facility]. He was treated for cellulitis of the left lower extremity with a course of clindamycin for 4 days. During his admission, he had CT scan of the abdomen and pelvis performed on 10/31/2013 and it showed a bulky mass with approximate cross-section of 6.0x15.0x17.0 CM. There is an and carina and extension into the upper thigh with moderate surrounding cellulitic changes. The findings are most consistent with bulky recurrence of tumor with conglomerate lymph node mass. On 11/02/2013 the patient underwent ultrasound-guided core biopsies of the left groin mass by interventional radiology. The final pathology (Accession: [id number]) showed atypical lymphoid proliferation. Immunohistochemistry for CD20, CD79a, and CD3 reveals the dense areas of lymphocytes are composed of B-cells with admixed T-cells. CD21 highlights a follicularndendritic network in the dense areas. In these areas, bcl-6 and CD10 are  negative, while bcl-2 is positive. Flow  cytometry was attempted, however, insufficent cells were available for cellular analysis. The lack of bcl-6 and CD10 argue against a follicular lymphoma. Flow cytometry ([id number]) had insufficient cells for analysis. The morphologic and immunophenotypic suggest chronic inflammation with scattered primary follicles within soft tissue, especially given the lack of diffuse infiltration. However, given the patient's history of a untreated non-Hodgkin lymphoma with proven clonality in the same location, minimal involvment can not be entirely excluded. The patient was referred to me today for further evaluation and recommendation regarding treatment of his condition. When seen today he has some flulike symptoms and continues to complain of swelling of the left lower extremity. The patient also has cough secondary to the chest congestion but no significant chest pain or shortness of breath. He denied having any significant weight loss or night sweats. His current weight is 463 pounds.  He denied having any nausea or vomiting, no fever or chills. Family history significant for heart disease in both mother and father. The patient is single and has no children. 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 He has no history of smoking but drinks alcohol occasionally and no history of drug abuse.    HPI  Past Medical History  Diagnosis Date  . Non Hodgkin's lymphoma   . Diabetes mellitus without complication   . Hypertension   . Sleep apnea     Past Surgical History  Procedure Laterality Date  . Biopsy for lymphoma      History reviewed. No pertinent family history.  Social History History  Substance Use Topics  . Smoking status: Never Smoker   . Smokeless tobacco: Not on file  . Alcohol Use: No    No Known Allergies  Current Outpatient Prescriptions  Medication Sig Dispense Refill  . aspirin EC 81 MG tablet Take 81 mg by  mouth daily.      [name] atorvastatin (LIPITOR) 20 MG tablet Take 20 mg by mouth daily.      [name] HYDROcodone-homatropine (HYCODAN) 5-1.5 MG/5ML syrup Take 5 mLs by mouth every 6 (six) hours as needed for cough.      [name] ibuprofen (ADVIL,MOTRIN) 400 MG tablet Take 1 tablet (400 mg total) by mouth every 6 (six) hours as needed for headache or moderate pain.  30 tablet  0  . pioglitazone-metformin (ACTOPLUS MET) 15-500 MG per tablet Take 1 tablet by mouth 2 (two) times daily with a meal.      . valsartan-hydrochlorothiazide (DIOVAN-HCT) 320-25 MG per tablet Take 1 tablet by mouth daily.      [name] guaiFENesin (ROBITUSSIN) 100 MG/5ML SOLN Take 10 mLs (200 mg total) by mouth every 4 (four) hours as needed.  1200 mL  0   No current facility-administered medications for this visit.    Review of Systems  Constitutional: positive for fatigue Eyes: negative Ears, nose, mouth, throat, and face: negative Respiratory: positive for cough Cardiovascular: negative Gastrointestinal: negative Genitourinary:negative Integument/breast: negative Hematologic/lymphatic: negative Musculoskeletal:negative Neurological: negative Behavioral/Psych: negative Endocrine: negative Allergic/Immunologic: negative  Physical Exam  VWU:JWJXB, healthy, no distress and obese SKIN: skin color, texture, turgor are normal, no rashes or significant lesions HEAD: Normocephalic, No masses, lesions, tenderness or abnormalities EYES: normal, PERRLA EARS: External ears normal, Canals clear OROPHARYNX:no exudate, no erythema and lips, buccal mucosa, and tongue normal  NECK: supple, no adenopathy, no JVD LYMPH:  A large palpable lymph node in the left groin area measuring more than 8 inches in size. LUNGS: clear to auscultation , and palpation HEART: regular  rate & rhythm and no murmurs ABDOMEN:abdomen soft, non-tender, obese, normal bowel sounds and no masses or organomegaly BACK: Back symmetric, no curvature., No CVA  tenderness EXTREMITIES: 1+ edema left lower extremity.  NEURO: alert & oriented x 3 with fluent speech, no focal motor/sensory deficits  PERFORMANCE STATUS: ECOG 1  LABORATORY DATA: Lab Results  Component Value Date   WBC 7.4 11/10/2013   HGB 10.9* 11/10/2013   HCT 33.2* 11/10/2013   MCV 87.2 11/10/2013   PLT 292 11/10/2013      Chemistry      Component Value Date/Time   NA 138 11/10/2013 1051   NA 133* 11/02/2013 0411   K 4.2 11/10/2013 1051   K 4.8 11/02/2013 0411   CL 95* 11/02/2013 0411   CO2 28 11/10/2013 1051   CO2 27 11/02/2013 0411   BUN 15.8 11/10/2013 1051   BUN 13 11/02/2013 0411   CREATININE 1.0 11/10/2013 1051   CREATININE 0.86 11/02/2013 0411      Component Value Date/Time   CALCIUM 10.2 11/10/2013 1051   CALCIUM 9.2 11/02/2013 0411   ALKPHOS 79 11/10/2013 1051   ALKPHOS 86 10/30/2013 1709   AST 28 11/10/2013 1051   AST 18 10/30/2013 1709   ALT 12 11/10/2013 1051   ALT 8 10/30/2013 1709   BILITOT 0.78 11/10/2013 1051   BILITOT 0.5 10/30/2013 1709       RADIOGRAPHIC STUDIES: Ct Abdomen Pelvis W Contrast  10/31/2013   CLINICAL DATA:  Left groin pain.  Non-Hodgkin's lymphoma.  EXAM: CT ABDOMEN AND PELVIS WITH CONTRAST  TECHNIQUE: Multidetector CT imaging of the abdomen and pelvis was performed using the standard protocol following bolus administration of intravenous contrast.  CONTRAST:  OMNIPAQUE IOHEXOL 300 MG/ML  SOLN  COMPARISON:  CT abdomen and pelvis 02/06/2008.  FINDINGS: BODY WALL: Morbid obesity. There are portions of the patient's body which could not be included within the scan due to his size.  In the left groin there is a bulky mass with approximate cross-section of 6 x 15 x 17 cm. There is infra inguinal extension into the upper thigh. Moderate surrounding cellulitic change. Surgical clips are seen superficial to this lesion. No involvement of the scrotum. No air or central necrosis. The findings are most consistent with bulky recurrence  of tumor with conglomerate lymph node mass. Recommend correlation with prior surgical pathology and/or repeat tissue sampling as clinically indicated. This mass was not present previously on the scan of 2009.  LOWER CHEST: Unremarkable.  ABDOMEN/PELVIS:  Liver: No focal abnormality.  Biliary: No evidence of biliary obstruction or stone.  Pancreas: Unremarkable.  Spleen: Unremarkable.  Adrenals: Unremarkable.  Kidneys and ureters: No hydronephrosis or stone.  Bladder: Unremarkable.  Reproductive: Unremarkable.  Bowel: No obstruction. Normal appendix.  Retroperitoneum: No mass or adenopathy.  Peritoneum: No free fluid or gas.  Vascular: No acute abnormality.  OSSEOUS: No acute abnormalities.  IMPRESSION: 6 x 15 x 17 mm bulky lesion in the left groin, suspected conglomerate lymph node mass in this patient with non-Hodgkin's lymphoma. Moderate surrounding cellulitic change. Correlate with prior biopsy and/or repeat tissue sampling recommended.   Electronically Signed   By: Davonna Belling M.D.   On: 10/31/2013 00:44   US Biopsy  11/02/2013   CLINICAL DATA:  History of non-Hodgkin's lymphoma. The patient has a large left groin mass.  EXAM: ULTRASOUND-GUIDED BIOPSY OF LEFT GROIN MASS  Physician: Rachelle Hora. Henn, MD  MEDICATIONS: Versed 2 mg, fentanyl 100 mcg. A radiology nurse monitored the  patient for moderate sedation.  ANESTHESIA/SEDATION: Moderate sedation time: 25 min  FLUOROSCOPY TIME:  None  PROCEDURE: The procedure was explained to the patient. The risks and benefits of the procedure were discussed and the patient's questions were addressed. Informed consent was obtained from the patient. The left upper thigh and groin region was evaluated with ultrasound. There is ill-defined abnormal soft tissue along the medial aspect of the left groin which corresponds with the recent CT findings. The left upper groin was prepped and draped in sterile fashion. Skin was anesthetized with 1% lidocaine. A 17 gauge coaxial needle  was directed into the soft tissue lesion. A total of 6 core biopsies were obtained with an 18 gauge device. Samples were placed in saline. 17 gauge needle was removed without complication.  COMPLICATIONS: None  FINDINGS: Ill-defined soft tissue mass involving the medial left upper groin.  IMPRESSION: Ultrasound-guided core biopsies of the left groin mass.   Electronically Signed   By: Richarda Overlie M.D.   On: 11/02/2013 17:49    ASSESSMENT: This is a very pleasant 57 years old morbidly obese African American male with history of follicular low-grade non-Hodgkin lymphoma diagnosed in January of 2009. He has not received any treatment since that time. There is significant disease progression seen on the recent scan with swelling of the left lower extremity.    PLAN: I have a lengthy discussion with the patient today about his current disease stage, prognosis and treatment options. I recommended for the patient to complete the staging workup by ordering a PET scan for further evaluation and rule out any lymphadenopathy. The patient is morbidly obese and he may not be able to get his PET scan locally because of the weight limit for these machines. I would see the patient can get his PET scan performed at [facility] which has a maximum weight limit of 500 pounds. I would also consider the patient for excisional biopsy of the left inguinal lymph node to rule out any transformation to large cell non-Hodgkin lymphoma. I will see the patient back for followup visit in 3 weeks for reevaluation and discussion of his scan results and further recommendations regarding treatment of his condition. He was advised to call immediately if he has any concerning symptoms in the interval. The patient voices understanding of current disease status and treatment options and is in agreement with the current care plan.  All questions were answered. The patient knows to call the clinic with any problems, questions or  concerns. We can certainly see the patient much sooner if necessary.  Thank you so much for allowing me to participate in the care of Douglas [name]. I will continue to follow up the patient with you and assist in his care.  I spent 40 minutes counseling the patient face to face. The total time spent in the appointment was 60 minutes.  Tyresha Fede K. 11/10/2013, 12:35 PM

## 2013-11-10 NOTE — Patient Instructions (Signed)
Followup visit in 3 weeks after repeating a PET scan to complete the staging of you disease.

## 2013-11-10 NOTE — Telephone Encounter (Signed)
gv and printd appt sched and avs for pt for Jan 2015

## 2013-11-11 ENCOUNTER — Telehealth: Payer: Self-pay | Admitting: Medical Oncology

## 2013-11-11 NOTE — Telephone Encounter (Signed)
Per Dr Arbutus Ped pt weight exceeds PET scanners in [place] , [place] and [facility]. I called [facility] and dept is closed. I will ask RN to follow up on Friday .

## 2013-11-13 NOTE — Telephone Encounter (Signed)
Called [facility], They state that they should be able to accommodate doing a PET scan for patient as long as he can hold his arms above his head for 20 minutes and is not claustrophobic.  Received paperwork from [facility] so they an process the order.  Called and left a message for patient on home # to call us back so we can confirm he an tolerate a PET scan so we can arrange an appt for him at [facility].  SLJ

## 2013-11-16 NOTE — Telephone Encounter (Signed)
Spoke with patient, he states he is not claustrophobic and he can lift his arms above his head for 20 minutes during the scan.

## 2013-11-16 NOTE — Telephone Encounter (Signed)
Spoke with Douglas [name], they need authorization, CT/ MRIrecords, and clinic notes faxed to [phone].  Pt aware to be NPO 4 hours p/t PET scan and to check his bl sugar to make sure his bl sugar is <200.  Pt also aware to hold diabetic medication 4 hrs prior.  Map of Douglas [name] will be mailed to his house.  SLJ

## 2013-11-20 ENCOUNTER — Telehealth: Payer: Self-pay | Admitting: Medical Oncology

## 2013-11-20 NOTE — Telephone Encounter (Signed)
Calling about PET scan . I told him to expect a call from [facility] about appt -they are awaiting authorization.  I put STD claim form in Ebony's box.

## 2013-11-20 NOTE — Telephone Encounter (Signed)
Pt notified that STD form won't be ready until Monday.

## 2013-11-23 ENCOUNTER — Encounter: Payer: Self-pay | Admitting: Internal Medicine

## 2013-11-23 ENCOUNTER — Other Ambulatory Visit (HOSPITAL_COMMUNITY): Payer: Self-pay | Admitting: Urology

## 2013-11-23 DIAGNOSIS — N5089 Other specified disorders of the male genital organs: Secondary | ICD-10-CM

## 2013-11-23 NOTE — Progress Notes (Signed)
Put disability form on nurse's desk. °

## 2013-11-26 ENCOUNTER — Telehealth: Payer: Self-pay | Admitting: *Deleted

## 2013-11-26 NOTE — Telephone Encounter (Signed)
Pt was unable to get PET scan.  Dr Vista Mink aware.  Will keep f/u appt as scheduled 12/01/13.  SLJ

## 2013-11-27 ENCOUNTER — Other Ambulatory Visit (HOSPITAL_COMMUNITY): Payer: Self-pay | Admitting: Urology

## 2013-11-27 ENCOUNTER — Ambulatory Visit (HOSPITAL_COMMUNITY)
Admission: RE | Admit: 2013-11-27 | Discharge: 2013-11-27 | Disposition: A | Discharge status: Home / Self Care | Payer: BC Managed Care – PPO | Source: Ambulatory Visit | Attending: Urology | Admitting: Urology

## 2013-11-27 DIAGNOSIS — N5089 Other specified disorders of the male genital organs: Secondary | ICD-10-CM

## 2013-11-27 DIAGNOSIS — C8589 Other specified types of non-Hodgkin lymphoma, extranodal and solid organ sites: Secondary | ICD-10-CM | POA: Insufficient documentation

## 2013-11-27 DIAGNOSIS — N508 Other specified disorders of male genital organs: Secondary | ICD-10-CM | POA: Insufficient documentation

## 2013-11-27 DIAGNOSIS — R1909 Other intra-abdominal and pelvic swelling, mass and lump: Secondary | ICD-10-CM | POA: Insufficient documentation

## 2013-11-27 DIAGNOSIS — R9389 Abnormal findings on diagnostic imaging of other specified body structures: Secondary | ICD-10-CM | POA: Insufficient documentation

## 2013-12-01 ENCOUNTER — Other Ambulatory Visit: Payer: Self-pay | Admitting: Internal Medicine

## 2013-12-01 ENCOUNTER — Telehealth: Payer: Self-pay | Admitting: *Deleted

## 2013-12-01 ENCOUNTER — Telehealth: Payer: Self-pay | Admitting: Internal Medicine

## 2013-12-01 ENCOUNTER — Encounter: Payer: Self-pay | Admitting: Internal Medicine

## 2013-12-01 ENCOUNTER — Encounter: Payer: Self-pay | Admitting: *Deleted

## 2013-12-01 ENCOUNTER — Ambulatory Visit (HOSPITAL_COMMUNITY)
Admission: RE | Admit: 2013-12-01 | Discharge: 2013-12-01 | Disposition: A | Discharge status: Home / Self Care | Payer: BC Managed Care – PPO | Source: Ambulatory Visit | Attending: Internal Medicine | Admitting: Internal Medicine

## 2013-12-01 ENCOUNTER — Other Ambulatory Visit: Payer: BC Managed Care – PPO

## 2013-12-01 ENCOUNTER — Other Ambulatory Visit (HOSPITAL_BASED_OUTPATIENT_CLINIC_OR_DEPARTMENT_OTHER): Payer: BC Managed Care – PPO

## 2013-12-01 ENCOUNTER — Ambulatory Visit (HOSPITAL_BASED_OUTPATIENT_CLINIC_OR_DEPARTMENT_OTHER): Payer: BC Managed Care – PPO | Admitting: Internal Medicine

## 2013-12-01 VITALS — BP 157/93 | HR 111 | Temp 98.6°F | Resp 18 | Ht 70.0 in | Wt >= 6400 oz

## 2013-12-01 DIAGNOSIS — R2242 Localized swelling, mass and lump, left lower limb: Secondary | ICD-10-CM

## 2013-12-01 DIAGNOSIS — C8590 Non-Hodgkin lymphoma, unspecified, unspecified site: Secondary | ICD-10-CM

## 2013-12-01 DIAGNOSIS — C8589 Other specified types of non-Hodgkin lymphoma, extranodal and solid organ sites: Secondary | ICD-10-CM

## 2013-12-01 DIAGNOSIS — R52 Pain, unspecified: Secondary | ICD-10-CM

## 2013-12-01 DIAGNOSIS — M79609 Pain in unspecified limb: Secondary | ICD-10-CM | POA: Insufficient documentation

## 2013-12-01 DIAGNOSIS — M7989 Other specified soft tissue disorders: Secondary | ICD-10-CM

## 2013-12-01 DIAGNOSIS — L0390 Cellulitis, unspecified: Secondary | ICD-10-CM

## 2013-12-01 DIAGNOSIS — R229 Localized swelling, mass and lump, unspecified: Secondary | ICD-10-CM | POA: Insufficient documentation

## 2013-12-01 DIAGNOSIS — R609 Edema, unspecified: Secondary | ICD-10-CM | POA: Insufficient documentation

## 2013-12-01 LAB — CBC WITH DIFFERENTIAL/PLATELET
BASO%: 0.8 % (ref 0.0–2.0)
BASOS ABS: 0.1 10*3/uL (ref 0.0–0.1)
EOS ABS: 2 10*3/uL — AB (ref 0.0–0.5)
EOS%: 22.2 % — ABNORMAL HIGH (ref 0.0–7.0)
HEMATOCRIT: 32.5 % — AB (ref 38.4–49.9)
HEMOGLOBIN: 10.5 g/dL — AB (ref 13.0–17.1)
LYMPH%: 4.2 % — AB (ref 14.0–49.0)
MCH: 27.9 pg (ref 27.2–33.4)
MCHC: 32.3 g/dL (ref 32.0–36.0)
MCV: 86.4 fL (ref 79.3–98.0)
MONO#: 1.3 10*3/uL — AB (ref 0.1–0.9)
MONO%: 14.1 % — AB (ref 0.0–14.0)
NEUT%: 58.7 % (ref 39.0–75.0)
NEUTROS ABS: 5.4 10*3/uL (ref 1.5–6.5)
PLATELETS: 287 10*3/uL (ref 140–400)
RBC: 3.76 10*6/uL — ABNORMAL LOW (ref 4.20–5.82)
RDW: 14 % (ref 11.0–14.6)
WBC: 9.1 10*3/uL (ref 4.0–10.3)
lymph#: 0.4 10*3/uL — ABNORMAL LOW (ref 0.9–3.3)

## 2013-12-01 LAB — COMPREHENSIVE METABOLIC PANEL (CC13)
ALK PHOS: 80 U/L (ref 40–150)
ALT: 11 U/L (ref 0–55)
AST: 37 U/L — ABNORMAL HIGH (ref 5–34)
Albumin: 2.6 g/dL — ABNORMAL LOW (ref 3.5–5.0)
Anion Gap: 17 mEq/L — ABNORMAL HIGH (ref 3–11)
BILIRUBIN TOTAL: 0.72 mg/dL (ref 0.20–1.20)
BUN: 19.6 mg/dL (ref 7.0–26.0)
CO2: 26 meq/L (ref 22–29)
Calcium: 10.4 mg/dL (ref 8.4–10.4)
Chloride: 96 mEq/L — ABNORMAL LOW (ref 98–109)
Creatinine: 1.1 mg/dL (ref 0.7–1.3)
GLUCOSE: 143 mg/dL — AB (ref 70–140)
Potassium: 4 mEq/L (ref 3.5–5.1)
Sodium: 139 mEq/L (ref 136–145)
Total Protein: 8 g/dL (ref 6.4–8.3)

## 2013-12-01 LAB — HIV ANTIBODY (ROUTINE TESTING W REFLEX): HIV: NONREACTIVE

## 2013-12-01 LAB — LACTATE DEHYDROGENASE (CC13): LDH: 1080 U/L — ABNORMAL HIGH (ref 125–245)

## 2013-12-01 LAB — HEPATITIS PANEL, ACUTE
HCV AB: NEGATIVE
HEP A IGM: NONREACTIVE
HEP B S AG: NEGATIVE
Hep B C IgM: NONREACTIVE

## 2013-12-01 MED ORDER — OXYCODONE-ACETAMINOPHEN 10-325 MG PO TABS: 1.0000 | ORAL_TABLET | Freq: Four times a day (QID) | ORAL | Status: DC | PRN

## 2013-12-01 MED ORDER — ALLOPURINOL 100 MG PO TABS: 100.0000 mg | ORAL_TABLET | Freq: Two times a day (BID) | ORAL | Status: DC

## 2013-12-01 MED ORDER — PROCHLORPERAZINE MALEATE 10 MG PO TABS: 10.0000 mg | ORAL_TABLET | Freq: Four times a day (QID) | ORAL | Status: AC | PRN

## 2013-12-01 NOTE — Telephone Encounter (Signed)
Per staff message and POF I have scheduled appts. There was no available for treatment on 1/20 moved to 1/21 and advised scheduler. JMW

## 2013-12-01 NOTE — Progress Notes (Addendum)
VASCULAR LAB PRELIMINARY  PRELIMINARY  PRELIMINARY  PRELIMINARY  Left lower extremity venous duplex completed.    Preliminary report: Inconclusive study due to swelling, tightness of the leg,mass appearing structure in the left thigh , and body habitus (481 1/2 lbs ). Limited images which revealed no obvious evidence of DVT or superficial thrombus.  Calvina Liptak, RVS 12/01/2013, 12:43 PM

## 2013-12-01 NOTE — Patient Instructions (Signed)
We discussed treatment of her condition including systemic therapy with Bendamustine and Rituxin

## 2013-12-01 NOTE — Progress Notes (Signed)
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   PRIOR THERAPY: None. He was followed by observation by his previous oncologist.  CURRENT THERAPY: Systemic chemotherapy with Bendamustine 90 mg/M2 on days 1 and 2 in addition to Rituxan 375 mg/M2 on day 1 every 4 weeks. First dose 12/08/2013.  INTERVAL HISTORY: Douglas [name] 58 y.o. male returns to the clinic today for follow up visit accompanied by his girlfriend. The patient continues to complain of significant swelling of the left lower extremity. He also has scrotal swelling and ultrasound of the scrotum showed no significant mass but there was severe scrotal wall thickening up to 4.5 CM with and infiltrative appearance similar to the left inguinal process. The patient denied having any other significant complaints except for the left inguinal pain. He currently takes ibuprofen for pain management. He also complaining of increasing fatigue and weakness. The patient denied having any significant fever or chills. He denied having any chest pain but has shortness breath with exertion was no cough or hemoptysis. He has no nausea or vomiting. He was supposed to have a PET scan performed at [facility] but unfortunately because of his weight and size they were unable to do his PET scan. He is here today for evaluation and discussion of his treatment options.  MEDICAL HISTORY: Past Medical History  Diagnosis Date  . Non Hodgkin's lymphoma   . Diabetes mellitus without complication   . Hypertension   . Sleep apnea     ALLERGIES:  has No Known Allergies.  MEDICATIONS:  Current Outpatient Prescriptions  Medication Sig Dispense  Refill  . aspirin EC 81 MG tablet Take 81 mg by mouth daily.      [name] atorvastatin (LIPITOR) 20 MG tablet Take 20 mg by mouth daily.      [name] guaiFENesin (ROBITUSSIN) 100 MG/5ML SOLN Take 10 mLs (200 mg total) by mouth every 4 (four) hours as needed.  1200 mL  0  . ibuprofen (ADVIL,MOTRIN) 400 MG tablet Take 1 tablet (400 mg total) by mouth every 6 (six) hours as needed for headache or moderate pain.  30 tablet  0  . pioglitazone-metformin (ACTOPLUS MET) 15-500 MG per tablet Take 1 tablet by mouth 2 (two) times daily with a meal.      . valsartan-hydrochlorothiazide (DIOVAN-HCT) 320-25 MG per tablet Take 1 tablet by mouth daily.       No current facility-administered medications for this visit.    SURGICAL HISTORY:  Past Surgical History  Procedure Laterality Date  . Biopsy for lymphoma      REVIEW OF SYSTEMS:  Constitutional: positive for fatigue Eyes: negative Ears, nose, mouth, throat, and face: negative Respiratory: positive for dyspnea on exertion Cardiovascular: negative Gastrointestinal: negative Genitourinary:negative Integument/breast: negative Hematologic/lymphatic: negative Musculoskeletal:positive for muscle weakness Neurological: negative Behavioral/Psych: negative Endocrine: negative Allergic/Immunologic: negative   PHYSICAL EXAMINATION: General appearance: alert, cooperative, fatigued and no distress Head: Normocephalic, without obvious abnormality, atraumatic Neck: no adenopathy, no JVD, supple, symmetrical, trachea midline and thyroid not enlarged, symmetric, no tenderness/mass/nodules Lymph nodes: Cervical, supraclavicular, and axillary nodes normal. Resp: clear to auscultation bilaterally Back: symmetric, no curvature. ROM normal. No CVA tenderness. Cardio: regular rate and rhythm, S1, S2 normal, no murmur, click, rub  or gallop GI: soft, non-tender; bowel sounds normal; no masses,  no organomegaly Genitalia: Scrotal swelling Extremities: edema 3+ edema left  lower extremity Neurologic: Alert and oriented X 3, normal strength and tone. Normal symmetric reflexes. Normal coordination and gait  ECOG PERFORMANCE STATUS: 1 - Symptomatic but completely ambulatory  Blood pressure 157/93, pulse 111, temperature 98.6 F (37 C), temperature source Oral, resp. rate 18, height _0  (1.778 m), weight 481 lb 8 oz (218.407 kg).  LABORATORY DATA: Lab Results  Component Value Date   WBC 9.1 12/01/2013   HGB 10.5* 12/01/2013   HCT 32.5* 12/01/2013   MCV 86.4 12/01/2013   PLT 287 12/01/2013      Chemistry      Component Value Date/Time   NA 138 11/10/2013 1051   NA 133* 11/02/2013 0411   K 4.2 11/10/2013 1051   K 4.8 11/02/2013 0411   CL 95* 11/02/2013 0411   CO2 28 11/10/2013 1051   CO2 27 11/02/2013 0411   BUN 15.8 11/10/2013 1051   BUN 13 11/02/2013 0411   CREATININE 1.0 11/10/2013 1051   CREATININE 0.86 11/02/2013 0411      Component Value Date/Time   CALCIUM 10.2 11/10/2013 1051   CALCIUM 9.2 11/02/2013 0411   ALKPHOS 79 11/10/2013 1051   ALKPHOS 86 10/30/2013 1709   AST 28 11/10/2013 1051   AST 18 10/30/2013 1709   ALT 12 11/10/2013 1051   ALT 8 10/30/2013 1709   BILITOT 0.78 11/10/2013 1051   BILITOT 0.5 10/30/2013 1709       RADIOGRAPHIC STUDIES: US Scrotum  11/27/2013   CLINICAL DATA:  58 year old male with history of non-Hodgkin's lymphoma, large left groin mass status post core biopsy in December. Pathology revealed abnormal lymphoproliferative process. Scrotal mass. Initial encounter.  EXAM: SCROTAL ULTRASOUND  DOPPLER ULTRASOUND OF THE TESTICLES  TECHNIQUE: Complete ultrasound examination of the testicles, epididymis, and other scrotal structures was performed. Color and spectral Doppler ultrasound were also utilized to evaluate blood flow to the testicles.  COMPARISON:  Pelvis CT 10/30/2013.  FINDINGS: Large body habitus. Severe scrotal wall thickening (up to 4.5 cm, see image 6). No sonographic evidence of subcutaneous gas.  On  10/30/2013 a confluent left inguinal soft tissue mass plus left pelvic side wall lymphadenopathy was demonstrated. The mass was incompletely visible but involves muscle, subcutaneous tissue, and possibly the dermis.  On some images of the left scrotal wall today the technologist questioned peristalsis (see series 2 semi images), but this appears to represent real-time vascularity rather than peristalsis, and there was no inguinal hernia on 10/30/2013.  Right testicle  Measurements: 3.9 x 2.9 x 3.0 cm. No mass or microlithiasis visualized.  Left testicle  Measurements: 3.7 x 2.5 x 2.6 cm. No mass or microlithiasis visualized.  Right epididymis: Echogenic, but nonenlarged. No evidence of hypervascularity (image 33).  Left epididymis: Echogenic and mildly enlarged comparing to the right side, thickness up to 11 mm. Mild hypervascularity compared to the right side (image 52).  Hydrocele:  None visualized.  Varicocele:  None visualized.  Pulsed Doppler interrogation of both testes demonstrates low resistance arterial and venous waveforms bilaterally.  IMPRESSION: 1. Severe scrotal wall thickening up to 4.5 cm, with an infiltrative appearance similar to the left inguinal process demonstrated on 11/02/2013. Repeat pelvis CT with IV contrast to include the entire scrotum may be beneficial in providing overall characterization of this process.  2. No testicular mass.  No hydrocele.  3. The left epididymis does appear mildly enlarged  and hypervascular, but the etiology in significance is unclear.   Electronically Signed   By: Lars Pinks M.D.   On: 11/27/2013 12:08   US Biopsy  11/02/2013   CLINICAL DATA:  History of non-Hodgkin's lymphoma. The patient has a large left groin mass.  EXAM: ULTRASOUND-GUIDED BIOPSY OF LEFT GROIN MASS  Physician: Stephan Minister. Henn, MD  MEDICATIONS: Versed 2 mg, fentanyl 100 mcg. A radiology nurse monitored the patient for moderate sedation.  ANESTHESIA/SEDATION: Moderate sedation time: 25 min   FLUOROSCOPY TIME:  None  PROCEDURE: The procedure was explained to the patient. The risks and benefits of the procedure were discussed and the patient's questions were addressed. Informed consent was obtained from the patient. The left upper thigh and groin region was evaluated with ultrasound. There is ill-defined abnormal soft tissue along the medial aspect of the left groin which corresponds with the recent CT findings. The left upper groin was prepped and draped in sterile fashion. Skin was anesthetized with 1% lidocaine. A 17 gauge coaxial needle was directed into the soft tissue lesion. A total of 6 core biopsies were obtained with an 18 gauge device. Samples were placed in saline. 17 gauge needle was removed without complication.  COMPLICATIONS: None  FINDINGS: Ill-defined soft tissue mass involving the medial left upper groin.  IMPRESSION: Ultrasound-guided core biopsies of the left groin mass.   Electronically Signed   By: Markus Daft M.D.   On: 11/02/2013 17:49   [place] Art/ven Flow Abd Pelv Doppler  11/27/2013   CLINICAL DATA:  58 year old male with history of non-Hodgkin's lymphoma, large left groin mass status post core biopsy in December. Pathology revealed abnormal lymphoproliferative process. Scrotal mass. Initial encounter.  EXAM: SCROTAL ULTRASOUND  DOPPLER ULTRASOUND OF THE TESTICLES  TECHNIQUE: Complete ultrasound examination of the testicles, epididymis, and other scrotal structures was performed. Color and spectral Doppler ultrasound were also utilized to evaluate blood flow to the testicles.  COMPARISON:  Pelvis CT 10/30/2013.  FINDINGS: Large body habitus. Severe scrotal wall thickening (up to 4.5 cm, see image 6). No sonographic evidence of subcutaneous gas.  On 10/30/2013 a confluent left inguinal soft tissue mass plus left pelvic side wall lymphadenopathy was demonstrated. The mass was incompletely visible but involves muscle, subcutaneous tissue, and possibly the dermis.  On some images of  the left scrotal wall today the technologist questioned peristalsis (see series 2 semi images), but this appears to represent real-time vascularity rather than peristalsis, and there was no inguinal hernia on 10/30/2013.  Right testicle  Measurements: 3.9 x 2.9 x 3.0 cm. No mass or microlithiasis visualized.  Left testicle  Measurements: 3.7 x 2.5 x 2.6 cm. No mass or microlithiasis visualized.  Right epididymis: Echogenic, but nonenlarged. No evidence of hypervascularity (image 33).  Left epididymis: Echogenic and mildly enlarged comparing to the right side, thickness up to 11 mm. Mild hypervascularity compared to the right side (image 52).  Hydrocele:  None visualized.  Varicocele:  None visualized.  Pulsed Doppler interrogation of both testes demonstrates low resistance arterial and venous waveforms bilaterally.  IMPRESSION: 1. Severe scrotal wall thickening up to 4.5 cm, with an infiltrative appearance similar to the left inguinal process demonstrated on 11/02/2013. Repeat pelvis CT with IV contrast to include the entire scrotum may be beneficial in providing overall characterization of this process.  2. No testicular mass.  No hydrocele.  3. The left epididymis does appear mildly enlarged and hypervascular, but the etiology in significance is unclear.   Electronically Signed  By: Lars Pinks M.D.   On: 11/27/2013 12:08    ASSESSMENT AND PLAN: This is a very pleasant 58 years old morbidly obese African American male with progressive follicular non-Hodgkin lymphoma initially diagnosed in 2009 but the patient has not received any treatment since that time and was on observation. He was seen in the past by Dr. Tressie Stalker and was lost to follow up with him for almost 4 years. I was unable to get a PET scan performed for restaging workup of this patient because of his body weight and size. I will complete the staging workup by ordering CT scan of the chest. I have a lengthy discussion with the patient and his  girlfriend today about his current disease status and treatment options. I recommended for the patient treatment with systemic chemotherapy with Bendamustine 90 mg/M2 on days 1 and 2 as well as Rituxan 375 mg/M2 on day 1 every 4 weeks. The patient was also given him the option of continuous observation but he is very symptomatic and he would like to proceed with treatment. I discussed with the patient adverse effect of this treatment including but not limited to alopecia, myelosuppression, nausea and vomiting, peripheral neuropathy, liver or renal dysfunction as well as hypersensitivity reaction to the Rituxan. I will arrange for the patient to have a chemotherapy education class today with expectation to start the first cycle of her systemic chemotherapy on 12/08/2013. I would also referred the patient to Dr. Pablo Ledger for consideration of palliative radiotherapy to the left inguinal mass. For the left lower extremity swelling, I sent the patient for Doppler of the left lower extremity to rule out deep venous thrombosis. For pain management, I started the patient on Percocet 10/325 mg by mouth every 6 hours as needed for pain. I also gave the patient prescription for Compazine 10 mg by mouth every 6 hours as needed for nausea. I would see the patient back for follow up visit in 2 weeks for evaluation and management any adverse effect of his chemotherapy.  The patient voices understanding of current disease status and treatment options and is in agreement with the current care plan.  All questions were answered. The patient knows to call the clinic with any problems, questions or concerns. We can certainly see the patient much sooner if necessary.  I spent 30 minutes counseling the patient face to face. The total time spent in the appointment was 40 minutes.  Disclaimer: This note was dictated with voice recognition software. Similar sounding words can inadvertently be transcribed and may not be  corrected upon review.

## 2013-12-01 NOTE — Telephone Encounter (Signed)
[facility] called reporting patient's left LE venous doppler test is inconclusive.  No obvious DVT noted.  Due to swelling and body habitus unable to determine.  Would like to know who to proceed with this patient.  PA-C notified.  Sending request to Dr. Julien Nordmann to call office for next steps.  Awaiting contact with provider.

## 2013-12-01 NOTE — Telephone Encounter (Signed)
Pt sent to chemo class today, pt will see radiation on 1/16/th, levt VM for [facility] regarding chemo. Doppler sent for precert. advised pt to get appt calendar after chemo class

## 2013-12-02 ENCOUNTER — Ambulatory Visit (HOSPITAL_COMMUNITY)
Admission: RE | Admit: 2013-12-02 | Discharge: 2013-12-02 | Disposition: A | Discharge status: Home / Self Care | Payer: BC Managed Care – PPO | Source: Ambulatory Visit | Attending: Internal Medicine | Admitting: Internal Medicine

## 2013-12-02 ENCOUNTER — Encounter (HOSPITAL_COMMUNITY): Payer: Self-pay

## 2013-12-02 DIAGNOSIS — C8590 Non-Hodgkin lymphoma, unspecified, unspecified site: Secondary | ICD-10-CM

## 2013-12-02 DIAGNOSIS — C8589 Other specified types of non-Hodgkin lymphoma, extranodal and solid organ sites: Secondary | ICD-10-CM | POA: Insufficient documentation

## 2013-12-02 DIAGNOSIS — R918 Other nonspecific abnormal finding of lung field: Secondary | ICD-10-CM | POA: Insufficient documentation

## 2013-12-02 MED ORDER — IOHEXOL 300 MG/ML  SOLN
75.0000 mL | Freq: Once | INTRAMUSCULAR | Status: AC | PRN
  Administered 2013-12-02: 75 mL via INTRAVENOUS

## 2013-12-03 ENCOUNTER — Ambulatory Visit (HOSPITAL_COMMUNITY): Admission: RE | Admit: 2013-12-03 | Payer: BC Managed Care – PPO | Source: Ambulatory Visit

## 2013-12-03 ENCOUNTER — Encounter: Payer: Self-pay | Admitting: Radiation Oncology

## 2013-12-03 NOTE — Progress Notes (Signed)
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    Patient presented with complaint of significant swelling of the left lower extremity. He also has scrotal swelling and ultrasound of the scrotum showed no significant mass but there was severe scrotal wall thickening up to 4.5 CM with and infiltrative appearance similar to the left inguinal process.    Biopsies of Left Groin (if applicable) revealed:  65/78/46 Soft Tissue Needle Core Biopsy, left groin - ATYPICAL LYMPHOID PROLIFERATION  Interpretation Tissue-Flow Cytometry - INSUFFICENT CELLS FOR ANALYSIS   Past/Anticipated interventions by medical oncology, if any: Systemic chemotherapy with Bendamustine 90 mg/M2 on days 1 and 2 in addition to Rituxan 375 mg/M2 on day 1 every 4 weeks. First dose 12/08/2013.   PRIOR THERAPY: None. He was followed by observation by his previous oncologist   Weight changes, if any, over the past 6 months: Weight 481.5 on 1/13 15  Recurrent fevers, or drenching night sweats, if any: denies night sweats  Constitutional symptoms: Fatigue, weakness and SOB on exertion  SAFETY ISSUES:  Prior radiation? No  Pacemaker/ICD? No  Possible current pregnancy? No  Is the patient on methotrexate? No  Current Complaints / other details:  LDH 1,080 on 12/01/13 ( 125 - 245 U/L)   Slow steady shuffling gait with assistance of cane. Short of breath. Denies nausea, vomiting, dizziness, diarrhea, or headache. Reports left hip "side" pain 5 on a scale of 0-10 despite taking percocet 10/325 one tablet at 0705. States, "it feels like a thousand needle are poking me at one time." Weight has jumped from 470 on 12/12 to 481 lb.

## 2013-12-04 ENCOUNTER — Encounter: Payer: Self-pay | Admitting: Radiation Oncology

## 2013-12-04 ENCOUNTER — Telehealth: Payer: Self-pay | Admitting: Internal Medicine

## 2013-12-04 ENCOUNTER — Ambulatory Visit
Admission: RE | Admit: 2013-12-04 | Discharge: 2013-12-04 | Disposition: A | Discharge status: Home / Self Care | Payer: BC Managed Care – PPO | Source: Ambulatory Visit | Attending: Radiation Oncology | Admitting: Radiation Oncology

## 2013-12-04 VITALS — BP 108/56 | HR 117 | Temp 98.0°F | Resp 20 | Ht 70.0 in | Wt >= 6400 oz

## 2013-12-04 DIAGNOSIS — Z79899 Other long term (current) drug therapy: Secondary | ICD-10-CM | POA: Insufficient documentation

## 2013-12-04 DIAGNOSIS — C8590 Non-Hodgkin lymphoma, unspecified, unspecified site: Secondary | ICD-10-CM

## 2013-12-04 DIAGNOSIS — G4730 Sleep apnea, unspecified: Secondary | ICD-10-CM | POA: Insufficient documentation

## 2013-12-04 DIAGNOSIS — R300 Dysuria: Secondary | ICD-10-CM | POA: Insufficient documentation

## 2013-12-04 DIAGNOSIS — M7989 Other specified soft tissue disorders: Secondary | ICD-10-CM | POA: Insufficient documentation

## 2013-12-04 DIAGNOSIS — R609 Edema, unspecified: Secondary | ICD-10-CM | POA: Insufficient documentation

## 2013-12-04 DIAGNOSIS — R918 Other nonspecific abnormal finding of lung field: Secondary | ICD-10-CM | POA: Insufficient documentation

## 2013-12-04 DIAGNOSIS — C8595 Non-Hodgkin lymphoma, unspecified, lymph nodes of inguinal region and lower limb: Secondary | ICD-10-CM | POA: Insufficient documentation

## 2013-12-04 DIAGNOSIS — R61 Generalized hyperhidrosis: Secondary | ICD-10-CM | POA: Insufficient documentation

## 2013-12-04 DIAGNOSIS — E669 Obesity, unspecified: Secondary | ICD-10-CM | POA: Insufficient documentation

## 2013-12-04 DIAGNOSIS — E119 Type 2 diabetes mellitus without complications: Secondary | ICD-10-CM | POA: Insufficient documentation

## 2013-12-04 DIAGNOSIS — I10 Essential (primary) hypertension: Secondary | ICD-10-CM | POA: Insufficient documentation

## 2013-12-04 DIAGNOSIS — I890 Lymphedema, not elsewhere classified: Secondary | ICD-10-CM | POA: Insufficient documentation

## 2013-12-04 DIAGNOSIS — G8929 Other chronic pain: Secondary | ICD-10-CM | POA: Insufficient documentation

## 2013-12-04 DIAGNOSIS — N5089 Other specified disorders of the male genital organs: Secondary | ICD-10-CM | POA: Insufficient documentation

## 2013-12-04 DIAGNOSIS — M79609 Pain in unspecified limb: Secondary | ICD-10-CM | POA: Insufficient documentation

## 2013-12-04 DIAGNOSIS — Z7982 Long term (current) use of aspirin: Secondary | ICD-10-CM | POA: Insufficient documentation

## 2013-12-04 HISTORY — DX: Localized enlarged lymph nodes: R59.0

## 2013-12-04 NOTE — Progress Notes (Signed)
Radiation Oncology         [phone]) [phone] ________________________________  Initial outpatient Consultation - Date: 12/04/2013   Name: Douglas [name] MRN: [id number]   DOB: 25-Feb-1956  REFERRING PHYSICIAN: [name], MD  DIAGNOSIS:  1. NHL (non-Hodgkin's lymphoma)     HISTORY OF PRESENT ILLNESS::Douglas [name] is a 58 y.o. 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 He was apparently seen by Dr. Tressie Stalker (these records are not available to me) and did not undergo any treatment. He then presented to the emergency room in December of this year with complaints of a 2 year history of an enlarging left inguinal mass, left lower extremity and scrotal edema and pain. A CT of the abdomen and pelvis on 10/31/2013 showed a 6 x 15 x 17 cm mass in the left groin with cellulitis. No other enlarged lymph nodes were noted. An ultrasound-guided core needle biopsy was attempted on 11/02/2013 which showed an atypical lymphoid proliferation. He has been seen by medical oncology and is scheduled to start chemotherapy next week. He was scheduled to have a PET scan performed at [facility] however because of his weight in size he was unable to have this performed. To complete his staging he had a CT of the chest which showed a 12 mm lesion in the right upper lobe as well as 2 other nodules. Short interim followup was recommended. He reports having some night sweats. He has chronic lower extremity pain due to swelling. He is unable to bend his knee secondary to the swelling. He also has difficulty with urination and swelling of his scrotum secondary to lymphedema. He was sent to me by Dr. Julien Nordmann for consideration of palliative radiation in the treatment of his lymphoma.  PREVIOUS RADIATION THERAPY: No  PAST MEDICAL HISTORY:  has a past medical history of Non Hodgkin's lymphoma (January 2009); Diabetes mellitus without complication;  Hypertension; Sleep apnea; and Lymphadenopathy, inguinal.    PAST SURGICAL HISTORY: Past Surgical History  Procedure Laterality Date  . Biopsy for lymphoma Left 11/02/13    groin    FAMILY HISTORY: History reviewed. No pertinent family history.  SOCIAL HISTORY:  History  Substance Use Topics  . Smoking status: Never Smoker   . Smokeless tobacco: Not on file  . Alcohol Use: No    ALLERGIES: Review of patient's allergies indicates no known allergies.  MEDICATIONS:  Current Outpatient Prescriptions  Medication Sig Dispense Refill  . aspirin EC 81 MG tablet Take 81 mg by mouth daily.      [name] atorvastatin (LIPITOR) 20 MG tablet Take 20 mg by mouth daily.      [name] oxyCODONE-acetaminophen (PERCOCET) 10-325 MG per tablet Take 1 tablet by mouth every 6 (six) hours as needed for pain.  60 tablet  0  . pioglitazone-metformin (ACTOPLUS MET) 15-500 MG per tablet Take 1 tablet by mouth 2 (two) times daily with a meal.      . valsartan-hydrochlorothiazide (DIOVAN-HCT) 320-25 MG per tablet Take 1 tablet by mouth daily.      [name] allopurinol (ZYLOPRIM) 100 MG tablet Take 1 tablet (100 mg total) by mouth 2 (two) times daily.  60 tablet  2  . guaiFENesin (ROBITUSSIN) 100 MG/5ML SOLN Take 10 mLs (200 mg total) by mouth every 4 (four) hours as needed.  1200 mL  0  . ibuprofen (ADVIL,MOTRIN) 400 MG tablet Take 1 tablet (400 mg total) by mouth every 6 (six) hours as needed for  headache or moderate pain.  30 tablet  0  . prochlorperazine (COMPAZINE) 10 MG tablet Take 1 tablet (10 mg total) by mouth every 6 (six) hours as needed for nausea or vomiting.  60 tablet  0   No current facility-administered medications for this encounter.    REVIEW OF SYSTEMS:  A 15 point review of systems is documented in the electronic medical record. This was obtained by the nursing staff. However, I reviewed this with the patient to discuss relevant findings and make appropriate changes.  Pertinent items are noted in  HPI.  PHYSICAL EXAM:  Filed Vitals:   12/04/13 0837  BP: 108/56  Pulse: 117  Temp: 98 F (36.7 C)  Resp: 20  .481 lb 8 oz (218.407 kg). He is an obese male who appears his stated age. He stands during the exam. He has 3+ nonpitting edema in his left lower extremities. His penis is swollen as is his left scrotum. I'm not really able to palpate a definitive mass secondary to the swelling and edema present in the subcutaneous tissues around his groin and leg.  LABORATORY DATA:  Lab Results  Component Value Date   WBC 9.1 12/01/2013   HGB 10.5* 12/01/2013   HCT 32.5* 12/01/2013   MCV 86.4 12/01/2013   PLT 287 12/01/2013   Lab Results  Component Value Date   NA 139 12/01/2013   K 4.0 12/01/2013   CL 95* 11/02/2013   CO2 26 12/01/2013   Lab Results  Component Value Date   ALT 11 12/01/2013   AST 37* 12/01/2013   ALKPHOS 80 12/01/2013   BILITOT 0.72 12/01/2013     RADIOGRAPHY: Ct Chest W Contrast  12/02/2013   CLINICAL DATA:  Lymphoma.  EXAM: CT CHEST WITH CONTRAST  TECHNIQUE: Multidetector CT imaging of the chest was performed during intravenous contrast administration.  CONTRAST:  69m OMNIPAQUE IOHEXOL 300 MG/ML  SOLN  COMPARISON:  02/06/2008.  FINDINGS: The chest wall is unremarkable. No supraclavicular or axillary lymphadenopathy. Small scattered lymph nodes are noted. The bony thorax is intact. No destructive bone lesions or spinal canal compromise. Advanced degenerative changes involving the thoracic spine with anterior and lateral bridging osteophytes.  The heart is normal in size. No pericardial effusion. No mediastinal or hilar mass or lymphadenopathy. The aorta is normal in caliber. No dissection. The esophagus is grossly normal.  Examination of the lung parenchyma demonstrates to pulmonary nodules. There is a 12.5 mm lesion in the right upper lobe adjacent to the pleura with a slight a low-fat interstitial change a rounded. The second nodule is in the right middle lobe on image number  23 and measures approximately 6 mm. A sub 3 mm subpleural pulmonary nodule is noted in the right upper lobe on image number 28. The left lung is clear. No pleural effusion. No acute pulmonary findings. No interstitial lung disease or bronchiectasis chief  The upper abdomen is grossly normal.  IMPRESSION: Three right-sided pulmonary nodules. The largest measures 12.5 mm in the right upper lobe and appears to have some inflammation around it. Short-term followup noncontrast chest CT is recommended in 3 months to re-evaluate.  No supraclavicular, axillary, mediastinal or hilar lymphadenopathy.   Electronically Signed   By: MKalman JewelsM.D.   On: 12/02/2013 15:00   [place]rotum  11/27/2013   CLINICAL DATA:  58year old male with history of non-Hodgkin's lymphoma, large left groin mass status post core biopsy in December. Pathology revealed abnormal lymphoproliferative process. Scrotal mass. Initial encounter.  EXAM: SCROTAL ULTRASOUND  DOPPLER ULTRASOUND OF THE TESTICLES  TECHNIQUE: Complete ultrasound examination of the testicles, epididymis, and other scrotal structures was performed. Color and spectral Doppler ultrasound were also utilized to evaluate blood flow to the testicles.  COMPARISON:  Pelvis CT 10/30/2013.  FINDINGS: Large body habitus. Severe scrotal wall thickening (up to 4.5 cm, see image 6). No sonographic evidence of subcutaneous gas.  On 10/30/2013 a confluent left inguinal soft tissue mass plus left pelvic side wall lymphadenopathy was demonstrated. The mass was incompletely visible but involves muscle, subcutaneous tissue, and possibly the dermis.  On some images of the left scrotal wall today the technologist questioned peristalsis (see series 2 semi images), but this appears to represent real-time vascularity rather than peristalsis, and there was no inguinal hernia on 10/30/2013.  Right testicle  Measurements: 3.9 x 2.9 x 3.0 cm. No mass or microlithiasis visualized.  Left testicle   Measurements: 3.7 x 2.5 x 2.6 cm. No mass or microlithiasis visualized.  Right epididymis: Echogenic, but nonenlarged. No evidence of hypervascularity (image 33).  Left epididymis: Echogenic and mildly enlarged comparing to the right side, thickness up to 11 mm. Mild hypervascularity compared to the right side (image 52).  Hydrocele:  None visualized.  Varicocele:  None visualized.  Pulsed Doppler interrogation of both testes demonstrates low resistance arterial and venous waveforms bilaterally.  IMPRESSION: 1. Severe scrotal wall thickening up to 4.5 cm, with an infiltrative appearance similar to the left inguinal process demonstrated on 11/02/2013. Repeat pelvis CT with IV contrast to include the entire scrotum may be beneficial in providing overall characterization of this process.  2. No testicular mass.  No hydrocele.  3. The left epididymis does appear mildly enlarged and hypervascular, but the etiology in significance is unclear.   Electronically Signed   By: Lars Pinks M.D.   On: 11/27/2013 12:08   [place] Art/ven Flow Abd Pelv Doppler  11/27/2013   CLINICAL DATA:  58 year old male with history of non-Hodgkin's lymphoma, large left groin mass status post core biopsy in December. Pathology revealed abnormal lymphoproliferative process. Scrotal mass. Initial encounter.  EXAM: SCROTAL ULTRASOUND  DOPPLER ULTRASOUND OF THE TESTICLES  TECHNIQUE: Complete ultrasound examination of the testicles, epididymis, and other scrotal structures was performed. Color and spectral Doppler ultrasound were also utilized to evaluate blood flow to the testicles.  COMPARISON:  Pelvis CT 10/30/2013.  FINDINGS: Large body habitus. Severe scrotal wall thickening (up to 4.5 cm, see image 6). No sonographic evidence of subcutaneous gas.  On 10/30/2013 a confluent left inguinal soft tissue mass plus left pelvic side wall lymphadenopathy was demonstrated. The mass was incompletely visible but involves muscle, subcutaneous tissue, and  possibly the dermis.  On some images of the left scrotal wall today the technologist questioned peristalsis (see series 2 semi images), but this appears to represent real-time vascularity rather than peristalsis, and there was no inguinal hernia on 10/30/2013.  Right testicle  Measurements: 3.9 x 2.9 x 3.0 cm. No mass or microlithiasis visualized.  Left testicle  Measurements: 3.7 x 2.5 x 2.6 cm. No mass or microlithiasis visualized.  Right epididymis: Echogenic, but nonenlarged. No evidence of hypervascularity (image 33).  Left epididymis: Echogenic and mildly enlarged comparing to the right side, thickness up to 11 mm. Mild hypervascularity compared to the right side (image 52).  Hydrocele:  None visualized.  Varicocele:  None visualized.  Pulsed Doppler interrogation of both testes demonstrates low resistance arterial and venous waveforms bilaterally.  IMPRESSION: 1. Severe scrotal wall thickening  up to 4.5 cm, with an infiltrative appearance similar to the left inguinal process demonstrated on 11/02/2013. Repeat pelvis CT with IV contrast to include the entire scrotum may be beneficial in providing overall characterization of this process.  2. No testicular mass.  No hydrocele.  3. The left epididymis does appear mildly enlarged and hypervascular, but the etiology in significance is unclear.   Electronically Signed   By: Lars Pinks M.D.   On: 11/27/2013 12:08      IMPRESSION: Stage IB follicular low-grade non-Hodgkin's lymphoma (possible conversion to higher grade. Repeat biopsy shows atypical cells)  PLAN: I spoke to the patient and his girlfriend today. We discussed radiation as a palliative option to help alleviate some of this congestion. Unfortunately his weight is over a bout of our treatment table. We are also not able to implant his radiation given that he is over the weight limit of ours CT simulation table as well. My hope would be that he will respond quickly to chemotherapy. I also discussed with  him that he would likely benefit from physical therapy even once he starts responding to chemotherapy in order to mobilize some of this fluid out of his leg. If he does lose weight during chemotherapy I be happy to see him back. Weight limit of her treatment tables are 440 pounds. I spent 30 minutes  face to face with the patient and more than 50% of that time was spent in counseling and/or coordination of care.   ------------------------------------------------  Thea Silversmith, MD

## 2013-12-04 NOTE — Telephone Encounter (Signed)
called pt left message for upcoming appt on 1/20,advised pt to get appt calendar

## 2013-12-04 NOTE — Progress Notes (Signed)
See progress note under physician encounter. 

## 2013-12-04 NOTE — Progress Notes (Signed)
Complete PATIENT MEASURE OF DISTRESS worksheet with a score of 5 submitted to social work.  

## 2013-12-04 NOTE — Addendum Note (Signed)
Encounter addended by: Deirdre Evener, RN on: 12/04/2013  4:02 PM<BR>     Documentation filed: Charges VN

## 2013-12-05 ENCOUNTER — Telehealth: Payer: Self-pay | Admitting: Internal Medicine

## 2013-12-05 NOTE — Telephone Encounter (Signed)
Called pt, left message regarding appt for 1/21 lab and chemo , appt moved to 1/21 from 1/20 due to availability @ chemo room

## 2013-12-07 ENCOUNTER — Telehealth: Payer: Self-pay | Admitting: Medical Oncology

## 2013-12-07 NOTE — Telephone Encounter (Signed)
Confirmed appt for wed.

## 2013-12-08 ENCOUNTER — Encounter: Payer: Self-pay | Admitting: Internal Medicine

## 2013-12-08 ENCOUNTER — Other Ambulatory Visit: Payer: BC Managed Care – PPO

## 2013-12-08 NOTE — Progress Notes (Signed)
Faxed clinical information to [facility] @ [phone].

## 2013-12-09 ENCOUNTER — Other Ambulatory Visit (HOSPITAL_BASED_OUTPATIENT_CLINIC_OR_DEPARTMENT_OTHER): Payer: BC Managed Care – PPO

## 2013-12-09 ENCOUNTER — Ambulatory Visit (HOSPITAL_BASED_OUTPATIENT_CLINIC_OR_DEPARTMENT_OTHER): Payer: BC Managed Care – PPO

## 2013-12-09 VITALS — BP 132/71 | HR 96 | Temp 98.3°F | Resp 16

## 2013-12-09 DIAGNOSIS — C8589 Other specified types of non-Hodgkin lymphoma, extranodal and solid organ sites: Secondary | ICD-10-CM

## 2013-12-09 DIAGNOSIS — C8590 Non-Hodgkin lymphoma, unspecified, unspecified site: Secondary | ICD-10-CM

## 2013-12-09 DIAGNOSIS — Z5111 Encounter for antineoplastic chemotherapy: Secondary | ICD-10-CM

## 2013-12-09 DIAGNOSIS — Z5112 Encounter for antineoplastic immunotherapy: Secondary | ICD-10-CM

## 2013-12-09 LAB — CBC WITH DIFFERENTIAL/PLATELET
BASO%: 0.2 % (ref 0.0–2.0)
Basophils Absolute: 0 10*3/uL (ref 0.0–0.1)
EOS ABS: 2.7 10*3/uL — AB (ref 0.0–0.5)
EOS%: 22 % — ABNORMAL HIGH (ref 0.0–7.0)
HEMATOCRIT: 31.7 % — AB (ref 38.4–49.9)
HEMOGLOBIN: 10.1 g/dL — AB (ref 13.0–17.1)
LYMPH%: 10.3 % — ABNORMAL LOW (ref 14.0–49.0)
MCH: 27.3 pg (ref 27.2–33.4)
MCHC: 31.9 g/dL — ABNORMAL LOW (ref 32.0–36.0)
MCV: 85.7 fL (ref 79.3–98.0)
MONO#: 1.3 10*3/uL — AB (ref 0.1–0.9)
MONO%: 10.9 % (ref 0.0–14.0)
NEUT#: 6.9 10*3/uL — ABNORMAL HIGH (ref 1.5–6.5)
NEUT%: 56.6 % (ref 39.0–75.0)
PLATELETS: 303 10*3/uL (ref 140–400)
RBC: 3.7 10*6/uL — ABNORMAL LOW (ref 4.20–5.82)
RDW: 14.6 % (ref 11.0–14.6)
WBC: 12.2 10*3/uL — ABNORMAL HIGH (ref 4.0–10.3)
lymph#: 1.3 10*3/uL (ref 0.9–3.3)

## 2013-12-09 LAB — COMPREHENSIVE METABOLIC PANEL (CC13)
ALBUMIN: 2.5 g/dL — AB (ref 3.5–5.0)
ALT: 11 U/L (ref 0–55)
AST: 32 U/L (ref 5–34)
Alkaline Phosphatase: 73 U/L (ref 40–150)
Anion Gap: 18 mEq/L — ABNORMAL HIGH (ref 3–11)
BUN: 19.4 mg/dL (ref 7.0–26.0)
CO2: 20 meq/L — AB (ref 22–29)
Calcium: 10.5 mg/dL — ABNORMAL HIGH (ref 8.4–10.4)
Chloride: 97 mEq/L — ABNORMAL LOW (ref 98–109)
Creatinine: 1.1 mg/dL (ref 0.7–1.3)
GLUCOSE: 141 mg/dL — AB (ref 70–140)
POTASSIUM: 4.4 meq/L (ref 3.5–5.1)
SODIUM: 135 meq/L — AB (ref 136–145)
TOTAL PROTEIN: 7.7 g/dL (ref 6.4–8.3)
Total Bilirubin: 0.67 mg/dL (ref 0.20–1.20)

## 2013-12-09 LAB — URIC ACID (CC13): URIC ACID, SERUM: 8.8 mg/dL — AB (ref 2.6–7.4)

## 2013-12-09 MED ORDER — ACETAMINOPHEN 325 MG PO TABS
ORAL_TABLET | ORAL | Status: AC
  Filled 2013-12-09: qty 2

## 2013-12-09 MED ORDER — SODIUM CHLORIDE 0.9 % IV SOLN
Freq: Once | INTRAVENOUS | Status: AC
  Administered 2013-12-09: 09:00:00 via INTRAVENOUS

## 2013-12-09 MED ORDER — ONDANSETRON 8 MG/50ML IVPB (CHCC)
8.0000 mg | Freq: Once | INTRAVENOUS | Status: AC
  Administered 2013-12-09: 8 mg via INTRAVENOUS

## 2013-12-09 MED ORDER — ACETAMINOPHEN 325 MG PO TABS
650.0000 mg | ORAL_TABLET | Freq: Once | ORAL | Status: AC
  Administered 2013-12-09: 650 mg via ORAL

## 2013-12-09 MED ORDER — DIPHENHYDRAMINE HCL 25 MG PO CAPS
50.0000 mg | ORAL_CAPSULE | Freq: Once | ORAL | Status: AC
  Administered 2013-12-09: 50 mg via ORAL

## 2013-12-09 MED ORDER — DIPHENHYDRAMINE HCL 25 MG PO CAPS
ORAL_CAPSULE | ORAL | Status: AC
  Filled 2013-12-09: qty 2

## 2013-12-09 MED ORDER — DEXAMETHASONE SODIUM PHOSPHATE 10 MG/ML IJ SOLN
10.0000 mg | Freq: Once | INTRAMUSCULAR | Status: AC
  Administered 2013-12-09: 10 mg via INTRAVENOUS

## 2013-12-09 MED ORDER — SODIUM CHLORIDE 0.9 % IV SOLN
375.0000 mg/m2 | Freq: Once | INTRAVENOUS | Status: AC
  Administered 2013-12-09: 1200 mg via INTRAVENOUS
  Filled 2013-12-09: qty 120

## 2013-12-09 MED ORDER — BENDAMUSTINE HCL CHEMO INJECTION 180 MG/2ML
90.0000 mg/m2 | Freq: Once | INTRAVENOUS | Status: AC
  Administered 2013-12-09: 297 mg via INTRAVENOUS
  Filled 2013-12-09: qty 3.3

## 2013-12-09 MED ORDER — DEXAMETHASONE SODIUM PHOSPHATE 10 MG/ML IJ SOLN
INTRAMUSCULAR | Status: AC
  Filled 2013-12-09: qty 1

## 2013-12-09 NOTE — Patient Instructions (Signed)
[facility] Discharge Instructions for Patients Receiving Chemotherapy  Today you received the following chemotherapy agents Rituxan Treanda  To help prevent nausea and vomiting after your treatment, we encourage you to take your nausea medication as directed/prescribed.   If you develop nausea and vomiting that is not controlled by your nausea medication, call the clinic.   BELOW ARE SYMPTOMS THAT SHOULD BE REPORTED IMMEDIATELY:  *FEVER GREATER THAN 100.5 F  *CHILLS WITH OR WITHOUT FEVER  NAUSEA AND VOMITING THAT IS NOT CONTROLLED WITH YOUR NAUSEA MEDICATION  *UNUSUAL SHORTNESS OF BREATH  *UNUSUAL BRUISING OR BLEEDING  TENDERNESS IN MOUTH AND THROAT WITH OR WITHOUT PRESENCE OF ULCERS  *URINARY PROBLEMS  *BOWEL PROBLEMS  UNUSUAL RASH Items with * indicate a potential emergency and should be followed up as soon as possible.  Feel free to call the clinic you have any questions or concerns. The clinic phone number is [phone].

## 2013-12-10 ENCOUNTER — Encounter (INDEPENDENT_AMBULATORY_CARE_PROVIDER_SITE_OTHER): Payer: Self-pay

## 2013-12-10 ENCOUNTER — Ambulatory Visit (HOSPITAL_BASED_OUTPATIENT_CLINIC_OR_DEPARTMENT_OTHER): Payer: BC Managed Care – PPO

## 2013-12-10 VITALS — BP 118/60 | HR 96 | Temp 98.0°F | Resp 16

## 2013-12-10 DIAGNOSIS — Z5111 Encounter for antineoplastic chemotherapy: Secondary | ICD-10-CM

## 2013-12-10 DIAGNOSIS — C8589 Other specified types of non-Hodgkin lymphoma, extranodal and solid organ sites: Secondary | ICD-10-CM

## 2013-12-10 DIAGNOSIS — C8590 Non-Hodgkin lymphoma, unspecified, unspecified site: Secondary | ICD-10-CM

## 2013-12-10 MED ORDER — SODIUM CHLORIDE 0.9 % IV SOLN
90.0000 mg/m2 | Freq: Once | INTRAVENOUS | Status: AC
  Administered 2013-12-10: 297 mg via INTRAVENOUS
  Filled 2013-12-10: qty 3.3

## 2013-12-10 MED ORDER — ONDANSETRON 8 MG/50ML IVPB (CHCC)
8.0000 mg | Freq: Once | INTRAVENOUS | Status: AC
  Administered 2013-12-10: 8 mg via INTRAVENOUS

## 2013-12-10 MED ORDER — ONDANSETRON 8 MG/NS 50 ML IVPB
INTRAVENOUS | Status: AC
  Filled 2013-12-10: qty 8

## 2013-12-10 MED ORDER — DEXAMETHASONE SODIUM PHOSPHATE 10 MG/ML IJ SOLN
10.0000 mg | Freq: Once | INTRAMUSCULAR | Status: AC
  Administered 2013-12-10: 10 mg via INTRAVENOUS

## 2013-12-10 MED ORDER — DEXAMETHASONE SODIUM PHOSPHATE 10 MG/ML IJ SOLN
INTRAMUSCULAR | Status: AC
  Filled 2013-12-10: qty 1

## 2013-12-10 MED ORDER — SODIUM CHLORIDE 0.9 % IV SOLN
Freq: Once | INTRAVENOUS | Status: AC
  Administered 2013-12-10: 09:00:00 via INTRAVENOUS

## 2013-12-10 NOTE — Progress Notes (Signed)
[place] Psychosocial Distress Screening Clinical Social Work  Clinical Social Work was referred by distress screening protocol.  The patient scored a 5 on the Psychosocial Distress Thermometer which indicates moderate distress. Clinical Social Worker Intern phoned to assess for distress and other psychosocial needs. Patient did not answer the phone and message was left indicating for the Patient to contact if further support was needed.   Clinical Social Worker follow up needed: no  If yes, follow up plan:   Raygan Skarda S. [place] Work Intern [employer] [phone]

## 2013-12-10 NOTE — Patient Instructions (Signed)
[facility] Discharge Instructions for Patients Receiving Chemotherapy  Today you received the following chemotherapy agents:  Treanda  To help prevent nausea and vomiting after your treatment, we encourage you to take your nausea medication as ordered per MD.   If you develop nausea and vomiting that is not controlled by your nausea medication, call the clinic.   BELOW ARE SYMPTOMS THAT SHOULD BE REPORTED IMMEDIATELY:  *FEVER GREATER THAN 100.5 F  *CHILLS WITH OR WITHOUT FEVER  NAUSEA AND VOMITING THAT IS NOT CONTROLLED WITH YOUR NAUSEA MEDICATION  *UNUSUAL SHORTNESS OF BREATH  *UNUSUAL BRUISING OR BLEEDING  TENDERNESS IN MOUTH AND THROAT WITH OR WITHOUT PRESENCE OF ULCERS  *URINARY PROBLEMS  *BOWEL PROBLEMS  UNUSUAL RASH Items with * indicate a potential emergency and should be followed up as soon as possible.  Feel free to call the clinic you have any questions or concerns. The clinic phone number is [phone].    

## 2013-12-14 ENCOUNTER — Telehealth: Payer: Self-pay

## 2013-12-14 NOTE — Telephone Encounter (Signed)
Spoke with Douglas [name] and he is doing well after his treatment 12-11-13.  No problems withf nausea or vomiting.  He has experienced som constipation.  He took a bottle of mag-citrate this am has has a good result.  Patient takes colace 1 tab bid.  Suggested he increase to 2 bid. Use the Miralax 17 grams daily as needed prior to the mag-citrate as it can cause a lot of cramping.  He stated that he has some at home.  Patient verbalized understanding.

## 2013-12-14 NOTE — Telephone Encounter (Signed)
Message copied by Baruch Merl on Mon Dec 14, 2013  3:31 PM ------      Message from: Adria Dill H      Created: Wed Dec 09, 2013 11:36 AM      Regarding: chemo follow-up       Rituxan Treanda ------

## 2013-12-15 ENCOUNTER — Other Ambulatory Visit (HOSPITAL_BASED_OUTPATIENT_CLINIC_OR_DEPARTMENT_OTHER): Payer: BC Managed Care – PPO

## 2013-12-15 DIAGNOSIS — C8589 Other specified types of non-Hodgkin lymphoma, extranodal and solid organ sites: Secondary | ICD-10-CM

## 2013-12-15 DIAGNOSIS — C8590 Non-Hodgkin lymphoma, unspecified, unspecified site: Secondary | ICD-10-CM

## 2013-12-15 LAB — CBC WITH DIFFERENTIAL/PLATELET
BASO%: 0.7 % (ref 0.0–2.0)
BASOS ABS: 0 10*3/uL (ref 0.0–0.1)
EOS%: 15.2 % — ABNORMAL HIGH (ref 0.0–7.0)
Eosinophils Absolute: 1 10*3/uL — ABNORMAL HIGH (ref 0.0–0.5)
HCT: 30.3 % — ABNORMAL LOW (ref 38.4–49.9)
HEMOGLOBIN: 9.9 g/dL — AB (ref 13.0–17.1)
LYMPH%: 1.1 % — AB (ref 14.0–49.0)
MCH: 27.7 pg (ref 27.2–33.4)
MCHC: 32.5 g/dL (ref 32.0–36.0)
MCV: 85.3 fL (ref 79.3–98.0)
MONO#: 0.6 10*3/uL (ref 0.1–0.9)
MONO%: 9.5 % (ref 0.0–14.0)
NEUT%: 73.5 % (ref 39.0–75.0)
NEUTROS ABS: 4.8 10*3/uL (ref 1.5–6.5)
PLATELETS: 341 10*3/uL (ref 140–400)
RBC: 3.56 10*6/uL — AB (ref 4.20–5.82)
RDW: 14.4 % (ref 11.0–14.6)
WBC: 6.5 10*3/uL (ref 4.0–10.3)
lymph#: 0.1 10*3/uL — ABNORMAL LOW (ref 0.9–3.3)

## 2013-12-15 LAB — COMPREHENSIVE METABOLIC PANEL (CC13)
ALBUMIN: 2.9 g/dL — AB (ref 3.5–5.0)
ALT: 14 U/L (ref 0–55)
ANION GAP: 12 meq/L — AB (ref 3–11)
AST: 23 U/L (ref 5–34)
Alkaline Phosphatase: 68 U/L (ref 40–150)
BUN: 20.6 mg/dL (ref 7.0–26.0)
CO2: 30 meq/L — AB (ref 22–29)
Calcium: 10.2 mg/dL (ref 8.4–10.4)
Chloride: 94 mEq/L — ABNORMAL LOW (ref 98–109)
Creatinine: 1.2 mg/dL (ref 0.7–1.3)
GLUCOSE: 188 mg/dL — AB (ref 70–140)
POTASSIUM: 4.2 meq/L (ref 3.5–5.1)
Sodium: 136 mEq/L (ref 136–145)
Total Bilirubin: 0.72 mg/dL (ref 0.20–1.20)
Total Protein: 7.7 g/dL (ref 6.4–8.3)

## 2013-12-20 ENCOUNTER — Encounter (HOSPITAL_COMMUNITY): Payer: Self-pay | Admitting: Emergency Medicine

## 2013-12-20 ENCOUNTER — Inpatient Hospital Stay (HOSPITAL_COMMUNITY)
Admission: EM | Admit: 2013-12-20 | Discharge: 2013-12-26 | DRG: 378 | Disposition: A | Discharge status: Home Health | Payer: BC Managed Care – PPO | Attending: Internal Medicine | Admitting: Internal Medicine

## 2013-12-20 DIAGNOSIS — D1779 Benign lipomatous neoplasm of other sites: Secondary | ICD-10-CM | POA: Diagnosis present

## 2013-12-20 DIAGNOSIS — K922 Gastrointestinal hemorrhage, unspecified: Secondary | ICD-10-CM | POA: Diagnosis present

## 2013-12-20 DIAGNOSIS — E119 Type 2 diabetes mellitus without complications: Secondary | ICD-10-CM

## 2013-12-20 DIAGNOSIS — D6481 Anemia due to antineoplastic chemotherapy: Secondary | ICD-10-CM | POA: Diagnosis present

## 2013-12-20 DIAGNOSIS — C8295 Follicular lymphoma, unspecified, lymph nodes of inguinal region and lower limb: Secondary | ICD-10-CM | POA: Diagnosis present

## 2013-12-20 DIAGNOSIS — K648 Other hemorrhoids: Secondary | ICD-10-CM | POA: Diagnosis present

## 2013-12-20 DIAGNOSIS — R2242 Localized swelling, mass and lump, left lower limb: Secondary | ICD-10-CM

## 2013-12-20 DIAGNOSIS — G4730 Sleep apnea, unspecified: Secondary | ICD-10-CM | POA: Diagnosis present

## 2013-12-20 DIAGNOSIS — K264 Chronic or unspecified duodenal ulcer with hemorrhage: Principal | ICD-10-CM | POA: Diagnosis present

## 2013-12-20 DIAGNOSIS — D649 Anemia, unspecified: Secondary | ICD-10-CM

## 2013-12-20 DIAGNOSIS — D126 Benign neoplasm of colon, unspecified: Secondary | ICD-10-CM | POA: Diagnosis present

## 2013-12-20 DIAGNOSIS — I10 Essential (primary) hypertension: Secondary | ICD-10-CM | POA: Diagnosis present

## 2013-12-20 DIAGNOSIS — C8590 Non-Hodgkin lymphoma, unspecified, unspecified site: Secondary | ICD-10-CM | POA: Diagnosis present

## 2013-12-20 DIAGNOSIS — R000 Tachycardia, unspecified: Secondary | ICD-10-CM | POA: Diagnosis present

## 2013-12-20 DIAGNOSIS — R1084 Generalized abdominal pain: Secondary | ICD-10-CM | POA: Diagnosis present

## 2013-12-20 DIAGNOSIS — K5520 Angiodysplasia of colon without hemorrhage: Secondary | ICD-10-CM | POA: Diagnosis present

## 2013-12-20 DIAGNOSIS — D62 Acute posthemorrhagic anemia: Secondary | ICD-10-CM | POA: Diagnosis present

## 2013-12-20 DIAGNOSIS — K644 Residual hemorrhoidal skin tags: Secondary | ICD-10-CM | POA: Diagnosis present

## 2013-12-20 DIAGNOSIS — D638 Anemia in other chronic diseases classified elsewhere: Secondary | ICD-10-CM

## 2013-12-20 DIAGNOSIS — C8589 Other specified types of non-Hodgkin lymphoma, extranodal and solid organ sites: Secondary | ICD-10-CM

## 2013-12-20 DIAGNOSIS — D500 Iron deficiency anemia secondary to blood loss (chronic): Secondary | ICD-10-CM | POA: Diagnosis present

## 2013-12-20 DIAGNOSIS — L0390 Cellulitis, unspecified: Secondary | ICD-10-CM

## 2013-12-20 DIAGNOSIS — K5900 Constipation, unspecified: Secondary | ICD-10-CM | POA: Diagnosis present

## 2013-12-20 DIAGNOSIS — Z6841 Body Mass Index (BMI) 40.0 and over, adult: Secondary | ICD-10-CM

## 2013-12-20 DIAGNOSIS — K449 Diaphragmatic hernia without obstruction or gangrene: Secondary | ICD-10-CM | POA: Diagnosis present

## 2013-12-20 DIAGNOSIS — E785 Hyperlipidemia, unspecified: Secondary | ICD-10-CM

## 2013-12-20 DIAGNOSIS — T451X5A Adverse effect of antineoplastic and immunosuppressive drugs, initial encounter: Secondary | ICD-10-CM | POA: Diagnosis present

## 2013-12-20 DIAGNOSIS — Z8249 Family history of ischemic heart disease and other diseases of the circulatory system: Secondary | ICD-10-CM

## 2013-12-20 LAB — CBC WITH DIFFERENTIAL/PLATELET
BASOS ABS: 0 10*3/uL (ref 0.0–0.1)
Basophils Relative: 0 % (ref 0–1)
Eosinophils Absolute: 0.1 10*3/uL (ref 0.0–0.7)
Eosinophils Relative: 1 % (ref 0–5)
HCT: 22.8 % — ABNORMAL LOW (ref 39.0–52.0)
HEMOGLOBIN: 7.4 g/dL — AB (ref 13.0–17.0)
LYMPHS PCT: 7 % — AB (ref 12–46)
Lymphs Abs: 0.7 10*3/uL (ref 0.7–4.0)
MCH: 27.9 pg (ref 26.0–34.0)
MCHC: 32.5 g/dL (ref 30.0–36.0)
MCV: 86 fL (ref 78.0–100.0)
MONOS PCT: 6 % (ref 3–12)
Monocytes Absolute: 0.6 10*3/uL (ref 0.1–1.0)
NEUTROS ABS: 9 10*3/uL — AB (ref 1.7–7.7)
NEUTROS PCT: 86 % — AB (ref 43–77)
Platelets: 288 10*3/uL (ref 150–400)
RBC: 2.65 MIL/uL — ABNORMAL LOW (ref 4.22–5.81)
RDW: 15.5 % (ref 11.5–15.5)
WBC: 10.5 10*3/uL (ref 4.0–10.5)

## 2013-12-20 LAB — BASIC METABOLIC PANEL
BUN: 25 mg/dL — ABNORMAL HIGH (ref 6–23)
CHLORIDE: 99 meq/L (ref 96–112)
CO2: 26 mEq/L (ref 19–32)
Calcium: 8.5 mg/dL (ref 8.4–10.5)
Creatinine, Ser: 0.94 mg/dL (ref 0.50–1.35)
GFR calc Af Amer: >90 mL/min (ref >90)
GFR calc non Af Amer: >90 mL/min (ref >90)
Glucose, Bld: 261 mg/dL — ABNORMAL HIGH (ref 70–99)
POTASSIUM: 4 meq/L (ref 3.7–5.3)
Sodium: 138 mEq/L (ref 137–147)

## 2013-12-20 LAB — GLUCOSE, CAPILLARY: Glucose-Capillary: 157 mg/dL — ABNORMAL HIGH (ref 70–99)

## 2013-12-20 LAB — PROTIME-INR
INR: 1.1 (ref 0.00–1.49)
PROTHROMBIN TIME: 14 s (ref 11.6–15.2)

## 2013-12-20 LAB — PREPARE RBC (CROSSMATCH)

## 2013-12-20 LAB — ABO/RH: ABO/RH(D): A POS

## 2013-12-20 LAB — MRSA PCR SCREENING: MRSA BY PCR: NEGATIVE

## 2013-12-20 MED ORDER — SODIUM CHLORIDE 0.9 % IV SOLN: INTRAVENOUS | Status: DC

## 2013-12-20 MED ORDER — PANTOPRAZOLE SODIUM 40 MG IV SOLR
40.0000 mg | Freq: Two times a day (BID) | INTRAVENOUS | Status: DC
  Administered 2013-12-20: 40 mg via INTRAVENOUS
  Filled 2013-12-20 (×3): qty 40

## 2013-12-20 MED ORDER — ACETAMINOPHEN 325 MG PO TABS
650.0000 mg | ORAL_TABLET | Freq: Four times a day (QID) | ORAL | Status: DC | PRN
  Administered 2013-12-20: 650 mg via ORAL
  Filled 2013-12-20: qty 2

## 2013-12-20 MED ORDER — ONDANSETRON HCL 4 MG/2ML IJ SOLN
4.0000 mg | Freq: Four times a day (QID) | INTRAMUSCULAR | Status: DC | PRN
  Administered 2013-12-20: 4 mg via INTRAVENOUS
  Filled 2013-12-20: qty 2

## 2013-12-20 MED ORDER — ONDANSETRON HCL 4 MG PO TABS: 4.0000 mg | ORAL_TABLET | Freq: Four times a day (QID) | ORAL | Status: DC | PRN

## 2013-12-20 MED ORDER — SODIUM CHLORIDE 0.9 % IV SOLN
INTRAVENOUS | Status: DC
  Administered 2013-12-20 – 2013-12-26 (×4): via INTRAVENOUS

## 2013-12-20 MED ORDER — ACETAMINOPHEN 650 MG RE SUPP: 650.0000 mg | Freq: Four times a day (QID) | RECTAL | Status: DC | PRN

## 2013-12-20 MED ORDER — SODIUM CHLORIDE 0.9 % IV SOLN
1000.0000 mL | INTRAVENOUS | Status: DC
  Administered 2013-12-20: 1000 mL via INTRAVENOUS

## 2013-12-20 MED ORDER — OXYCODONE HCL 5 MG PO TABS
5.0000 mg | ORAL_TABLET | ORAL | Status: DC | PRN
  Administered 2013-12-20 – 2013-12-25 (×7): 5 mg via ORAL
  Filled 2013-12-20 (×7): qty 1

## 2013-12-20 MED ORDER — SODIUM CHLORIDE 0.9 % IV SOLN
1000.0000 mL | Freq: Once | INTRAVENOUS | Status: AC
  Administered 2013-12-20: 1000 mL via INTRAVENOUS

## 2013-12-20 MED ORDER — ATORVASTATIN CALCIUM 20 MG PO TABS
20.0000 mg | ORAL_TABLET | Freq: Every day | ORAL | Status: DC
  Administered 2013-12-21 – 2013-12-26 (×7): 20 mg via ORAL
  Filled 2013-12-20 (×8): qty 1

## 2013-12-20 MED ORDER — INSULIN ASPART 100 UNIT/ML ~~LOC~~ SOLN
0.0000 [IU] | SUBCUTANEOUS | Status: DC
  Administered 2013-12-20 – 2013-12-21 (×2): 3 [IU] via SUBCUTANEOUS
  Administered 2013-12-21: 2 [IU] via SUBCUTANEOUS
  Administered 2013-12-21 – 2013-12-22 (×3): 3 [IU] via SUBCUTANEOUS
  Administered 2013-12-22 – 2013-12-23 (×8): 2 [IU] via SUBCUTANEOUS
  Administered 2013-12-23: 3 [IU] via SUBCUTANEOUS
  Administered 2013-12-23: 2 [IU] via SUBCUTANEOUS
  Administered 2013-12-24: 3 [IU] via SUBCUTANEOUS
  Administered 2013-12-24 – 2013-12-25 (×4): 2 [IU] via SUBCUTANEOUS
  Administered 2013-12-25: 3 [IU] via SUBCUTANEOUS

## 2013-12-20 NOTE — ED Notes (Addendum)
Pt has history of Lymphoma, today went to bathroom and started passing bright red blood. CBG 284, VS 132/92 HR 122, R 20 98% RA

## 2013-12-20 NOTE — H&P (Signed)
[facility] History and Physical  Douglas [name] YCX:[id number] DOB: 1956-06-09 DOA: 12/20/2013  Referring physician:  PCP: [name], MD   Chief Complaint: Bloody stools  HPI: Douglas [name] is a 58 y.o. male with a past medical history of follicular low-grade non-Hodgkin's lymphoma that was diagnosed in January of 2009, with disease progression in 2014, was recently started on systemic chemotherapy with Bendamustine and Rituxan receiving his first dose on 12/08/2013. He had 5 episodes of bloody stools this afternoon, characterize as bright red blood, moderate amount, and associated with crampy generalized abdominal pain. He also reported associated shortness of breath and diaphoresis. He denies nausea, vomiting, hematemesis, melena, syncope, or presyncope, chest pain. He reports having  An episode of bright red blood per thumb approximately 7 years ago, at which time he stated undergoing colonoscopy. he is unaware of having a history of diverticulosis, his last CT scan of abdomen and pelvis was performed in 2009 with radiology report not mentioning diverticulosis. In the emergency department he was found to be anemic having a hemoglobin of 7.4 with hematocrit of 22.8. Prior to this he had a hemoglobin 9.9 on 12/15/2013. Patient was administered a bolus of normal saline in the emergency department. He is presently not on anticoagulant although does take 81 mg of aspirin daily.                                                                                                                                                                   Review of Systems:  Constitutional:  No weight loss, night sweats, Fevers, chills, fatigue.  HEENT:  No headaches, Difficulty swallowing,Tooth/dental problems,Sore throat,  No sneezing, itching, ear ache, nasal congestion, post nasal drip,  Cardio-vascular:  No chest pain, Orthopnea, PND, swelling in lower extremities, anasarca, dizziness,  palpitations  GI:  No heartburn, indigestion, nausea, vomiting, diarrhea, change in bowel habits, loss of appetite Positive for 5 episodes of bloody stools and crampy abdominal pain Resp: Positive for shortness of breath with exertion or at rest. No excess mucus, no productive cough, No non-productive cough, No coughing up of blood.No change in color of mucus.No wheezing.No chest wall deformity  Skin:  no rash or lesions.  GU:  no dysuria, change in color of urine, no urgency or frequency. No flank pain.  Musculoskeletal:  No joint pain or swelling. No decreased range of motion. No back pain.  Psych:  No change in mood or affect. No depression or anxiety. No memory loss.   Past Medical History  Diagnosis Date  . Non Hodgkin's lymphoma January 2009  . Diabetes mellitus without complication   . Hypertension   . Sleep apnea   . Lymphadenopathy, inguinal     left   Past Surgical History  Procedure  Laterality Date  . Biopsy for lymphoma Left 11/02/13    groin   Social History:  reports that he has never smoked. He does not have any smokeless tobacco history on file. He reports that he does not drink alcohol or use illicit drugs.  No Known Allergies  No family history on file.   Prior to Admission medications   Medication Sig Start Date End Date Taking? Authorizing Provider  aspirin EC 81 MG tablet Take 81 mg by mouth daily.   Yes Historical Provider, MD  atorvastatin (LIPITOR) 20 MG tablet Take 20 mg by mouth daily.   Yes Historical Provider, MD  docusate sodium (COLACE) 100 MG capsule Take 100 mg by mouth 2 (two) times daily.   Yes Historical Provider, MD  pioglitazone-metformin (ACTOPLUS MET) 15-500 MG per tablet Take 1 tablet by mouth 2 (two) times daily with a meal.   Yes Historical Provider, MD  polyethylene glycol (MIRALAX / GLYCOLAX) packet Take 17 g by mouth daily as needed for mild constipation.    Yes Historical Provider, MD  prochlorperazine (COMPAZINE) 10 MG tablet Take  1 tablet (10 mg total) by mouth every 6 (six) hours as needed for nausea or vomiting. 12/01/13  Yes [name], MD  valsartan-hydrochlorothiazide (DIOVAN-HCT) 320-25 MG per tablet Take 1 tablet by mouth daily.   Yes Historical Provider, MD   Physical Exam: Filed Vitals:   12/20/13 1811  BP: 104/59  Pulse: 111  Temp: 98.2 F (36.8 C)  Resp: 20    BP 104/59  Pulse 111  Temp(Src) 98.2 F (36.8 C) (Oral)  Resp 20  SpO2 99%  General:  Appears calm and comfortable, in no acute distress, morbidly obese Eyes: PERRL, normal lids, irises & conjunctiva ENT: grossly normal hearing, lips & tongue Neck: no LAD, masses or thyromegaly Cardiovascular: Distant heart sounds, RRR, no m/r/g. No LE edema. Telemetry: SR, no arrhythmias  Respiratory: CTA bilaterally, no w/r/r. Normal respiratory effort. Abdomen: soft, ntnd. Patient having benign abdominal exam in the emergency department, no rebound tenderness or guarding. Skin: no rash or induration seen on limited exam Musculoskeletal: grossly normal tone BUE/BLE Psychiatric: grossly normal mood and affect, speech fluent and appropriate Neurologic: grossly non-focal.          Labs on Admission:  Basic Metabolic Panel:  Recent Labs Lab 12/15/13 1044 12/20/13 1739  NA 136 138  K 4.2 4.0  CL  --  99  CO2 30* 26  GLUCOSE 188* 261*  BUN 20.6 25*  CREATININE 1.2 0.94  CALCIUM 10.2 8.5   Liver Function Tests:  Recent Labs Lab 12/15/13 1044  AST 23  ALT 14  ALKPHOS 68  BILITOT 0.72  PROT 7.7  ALBUMIN 2.9*   No results found for this basename: LIPASE, AMYLASE,  in the last 168 hours No results found for this basename: AMMONIA,  in the last 168 hours CBC:  Recent Labs Lab 12/15/13 1044 12/20/13 1739  WBC 6.5 10.5  NEUTROABS 4.8 9.0*  HGB 9.9* 7.4*  HCT 30.3* 22.8*  MCV 85.3 86.0  PLT 341 288   Cardiac Enzymes: No results found for this basename: CKTOTAL, CKMB, CKMBINDEX, TROPONINI,  in the last 168 hours  BNP  (last 3 results) No results found for this basename: PROBNP,  in the last 8760 hours CBG: No results found for this basename: GLUCAP,  in the last 168 hours  Radiological Exams on Admission: No results found.  EKG: Independently reviewed.   Assessment/Plan Active Problems:   Lower  GI bleed   Acute blood loss anemia   NHL (non-Hodgkin's lymphoma)   HTN (hypertension)   Morbid obesity   Diabetes mellitus   1. Lower GI bleed. Patient presenting to the emergency department after having 5 episodes of bloody stools this afternoon. I suspect this may represents a diverticular bleed. Given his complaints of shortness of breath, diaphoresis, and some dizziness, will type and cross and transfuse him with one unit of packed red blood cells overnight, check an H&H post transfusion. Of note he has not had any witnessed episodes of bloody stools while in the emergency department. Will discontinue his aspirin. I discussed case with Dr. Benson Norway of gastroenterology, requesting formal consultation. Will keep him n.p.o. Overnight, provide supportive care, IV fluids. 2. Acute blood loss anemia. Patient presenting with a hemoglobin of 7.4, down from 9.9 on 12/15/2013. I suspect secondary to diverticular bleed. Since he is symptomatic I will proceed with blood transfusion, type and cross and transfuse 1 unit of packed red blood cells. As outlined above, holding antiplatelet therapy.  3. History of hypertension. Patient having low blood pressures in the emergency room, but this most recent blood pressure 104/59. I am holding antihypertensive agents, provide volume resuscitation. 4. Type 2 diabetes mellitus. Making patient n.p.o. overnight, will discontinue oral hypoglycemics, place him on sliding scale coverage with Accu-Cheks every 4 hours for now. Check a hemoglobin A1c. 5. History of non-Hodgkin's lymphoma. He was recently started on systemic chemotherapy on 12/08/2013, currently sees Dr.Mohamed of medical  oncology. 6. Nutrition. N.p.o. 7. DVT prophylaxis. Bilateral extremity SCDs    Code Status: Full code Disposition Plan: Will admit patient to telemetry, anticipate he'll require greater than 2 night hospitalization  Time spent: 65 minutes  Kelvin Cellar [facility] Pager [phone]

## 2013-12-20 NOTE — ED Provider Notes (Addendum)
CSN: [id number]     Arrival date & time 12/20/13  1803 History   First MD Initiated Contact with Patient 12/20/13 1809     Chief complaint: Rectal bleeding  HPI Patient reports developing bright red blood from his rectum today.  He states his head through 4 episodes of this.  He has a known history of non-Hodgkin's lymphoma and is currently just started chemotherapy.  He has not had any radiation.  He's not on any anticoagulants except for an 81 mg aspirin.  He's never been told he has diverticulosis.  He has not had abdominal pain today.  He denies nausea.  Does report mild shortness of breath with exertion since this began.    Past Medical History  Diagnosis Date  . Non Hodgkin's lymphoma January 2009  . Diabetes mellitus without complication   . Hypertension   . Sleep apnea   . Lymphadenopathy, inguinal     left   Past Surgical History  Procedure Laterality Date  . Biopsy for lymphoma Left 11/02/13    groin   No family history on file. History  Substance Use Topics  . Smoking status: Never Smoker   . Smokeless tobacco: Not on file  . Alcohol Use: No    Review of Systems  All other systems reviewed and are negative.    Allergies  Review of patient's allergies indicates no known allergies.  Home Medications   Current Outpatient Rx  Name  Route  Sig  Dispense  Refill  . aspirin EC 81 MG tablet   Oral   Take 81 mg by mouth daily.         [name] atorvastatin (LIPITOR) 20 MG tablet   Oral   Take 20 mg by mouth daily.         [name] docusate sodium (COLACE) 100 MG capsule   Oral   Take 100 mg by mouth 2 (two) times daily.         . pioglitazone-metformin (ACTOPLUS MET) 15-500 MG per tablet   Oral   Take 1 tablet by mouth 2 (two) times daily with a meal.         . polyethylene glycol (MIRALAX / GLYCOLAX) packet   Oral   Take 17 g by mouth daily as needed for mild constipation.          . prochlorperazine (COMPAZINE) 10 MG tablet   Oral   Take 1 tablet (10 mg  total) by mouth every 6 (six) hours as needed for nausea or vomiting.   60 tablet   0   . valsartan-hydrochlorothiazide (DIOVAN-HCT) 320-25 MG per tablet   Oral   Take 1 tablet by mouth daily.          BP 104/59  Pulse 111  Temp(Src) 98.2 F (36.8 C) (Oral)  Resp 20  SpO2 99% Physical Exam  Nursing note and vitals reviewed. Constitutional: He is oriented to person, place, and time. He appears well-developed and well-nourished.  HENT:  Head: Normocephalic and atraumatic.  Eyes: EOM are normal.  Neck: Normal range of motion.  Cardiovascular: Normal rate, regular rhythm, normal heart sounds and intact distal pulses.   Pulmonary/Chest: Effort normal and breath sounds normal. No respiratory distress.  Abdominal: Soft. He exhibits no distension. There is no tenderness.  Morbidly obese.  known lymphoma and left groin region  Genitourinary:  Gross blood on rectal exam  Musculoskeletal: Normal range of motion.  Neurological: He is alert and oriented to person, place, and time.  Skin:  Skin is warm and dry.  Psychiatric: He has a normal mood and affect. Judgment normal.    ED Course  Procedures (including critical care time) Labs Review  Angiocath insertion Performed by: Hoy Morn Consent: Verbal consent obtained. Risks and benefits: risks, benefits and alternatives were discussed Time out: Immediately prior to procedure a "time out" was called to verify the correct patient, procedure, equipment, support staff and site/side marked as required. Preparation: Patient was prepped and draped in the usual sterile fashion. Vein Location: Left basilic Ultrasound Guided Gauge: 20 Normal blood return and flush without difficulty Patient tolerance: Patient tolerated the procedure well with no immediate complications.     Labs Reviewed  CBC WITH DIFFERENTIAL - Abnormal; Notable for the following:    RBC 2.65 (*)    Hemoglobin 7.4 (*)    HCT 22.8 (*)    Neutrophils Relative %  86 (*)    Neutro Abs 9.0 (*)    Lymphocytes Relative 7 (*)    All other components within normal limits  BASIC METABOLIC PANEL - Abnormal; Notable for the following:    Glucose, Bld 261 (*)    BUN 25 (*)    All other components within normal limits  PROTIME-INR  TYPE AND SCREEN  ABO/RH   Imaging Review No results found.  EKG Interpretation    Date/Time:  Sunday December 20 2013 18:13:30 EST Ventricular Rate:  111 PR Interval:  141 QRS Duration: 88 QT Interval:  338 QTC Calculation: 459 R Axis:   -54 Text Interpretation:  Sinus tachycardia Left anterior fascicular block Abnormal R-wave progression, early transition No significant change was found Confirmed by Hadley Soileau  MD, Elainah Rhyne ([id number]) on 12/20/2013 7:49:35 PM            MDM   1. Lower GI bleed   2. Anemia    Lower GI bleed.  Patient on admission for serial CBCs.  Patient reports colonoscopy 7 years ago without abnormality.  No known history of diverticulosis.  Doubt diverticulitis  7:37 PM Heart rate 99.  Patient feels better.  Hemoglobin 7.4.  This is a 2 and half grams drop from 5 days ago.  Admitted for symptomatic GI bleeding    Hoy Morn, MD 12/20/13 [place], MD 12/20/13 [phone]

## 2013-12-20 NOTE — ED Notes (Signed)
One unsuccessful attempt at IV. MD [facility] aware and will attempt US guided IV.

## 2013-12-21 DIAGNOSIS — E669 Obesity, unspecified: Secondary | ICD-10-CM

## 2013-12-21 DIAGNOSIS — K922 Gastrointestinal hemorrhage, unspecified: Secondary | ICD-10-CM

## 2013-12-21 DIAGNOSIS — D649 Anemia, unspecified: Secondary | ICD-10-CM

## 2013-12-21 DIAGNOSIS — I890 Lymphedema, not elsewhere classified: Secondary | ICD-10-CM

## 2013-12-21 DIAGNOSIS — C8299 Follicular lymphoma, unspecified, extranodal and solid organ sites: Secondary | ICD-10-CM

## 2013-12-21 LAB — CBC
HCT: 20.5 % — ABNORMAL LOW (ref 39.0–52.0)
HCT: 22.3 % — ABNORMAL LOW (ref 39.0–52.0)
HEMATOCRIT: 23.7 % — AB (ref 39.0–52.0)
HEMOGLOBIN: 7.7 g/dL — AB (ref 13.0–17.0)
Hemoglobin: 6.8 g/dL — CL (ref 13.0–17.0)
Hemoglobin: 7.3 g/dL — ABNORMAL LOW (ref 13.0–17.0)
MCH: 28.7 pg (ref 26.0–34.0)
MCH: 27.9 pg (ref 26.0–34.0)
MCH: 28.3 pg (ref 26.0–34.0)
MCHC: 33.2 g/dL (ref 30.0–36.0)
MCHC: 32.5 g/dL (ref 30.0–36.0)
MCHC: 32.7 g/dL (ref 30.0–36.0)
MCV: 86.5 fL (ref 78.0–100.0)
MCV: 85.9 fL (ref 78.0–100.0)
MCV: 86.4 fL (ref 78.0–100.0)
Platelets: 227 10*3/uL (ref 150–400)
Platelets: 232 10*3/uL (ref 150–400)
Platelets: 201 10*3/uL (ref 150–400)
RBC: 2.37 MIL/uL — ABNORMAL LOW (ref 4.22–5.81)
RBC: 2.76 MIL/uL — ABNORMAL LOW (ref 4.22–5.81)
RBC: 2.58 MIL/uL — ABNORMAL LOW (ref 4.22–5.81)
RDW: 15.4 % (ref 11.5–15.5)
RDW: 15.7 % — AB (ref 11.5–15.5)
RDW: 15.7 % — ABNORMAL HIGH (ref 11.5–15.5)
WBC: 7.7 10*3/uL (ref 4.0–10.5)
WBC: 7.7 10*3/uL (ref 4.0–10.5)
WBC: 12.1 10*3/uL — ABNORMAL HIGH (ref 4.0–10.5)

## 2013-12-21 LAB — GLUCOSE, CAPILLARY
GLUCOSE-CAPILLARY: 122 mg/dL — AB (ref 70–99)
GLUCOSE-CAPILLARY: 154 mg/dL — AB (ref 70–99)
Glucose-Capillary: 148 mg/dL — ABNORMAL HIGH (ref 70–99)
Glucose-Capillary: 115 mg/dL — ABNORMAL HIGH (ref 70–99)
Glucose-Capillary: 185 mg/dL — ABNORMAL HIGH (ref 70–99)
Glucose-Capillary: 156 mg/dL — ABNORMAL HIGH (ref 70–99)

## 2013-12-21 LAB — BASIC METABOLIC PANEL
BUN: 22 mg/dL (ref 6–23)
CALCIUM: 8.2 mg/dL — AB (ref 8.4–10.5)
CO2: 27 mEq/L (ref 19–32)
Chloride: 103 mEq/L (ref 96–112)
Creatinine, Ser: 0.94 mg/dL (ref 0.50–1.35)
GLUCOSE: 134 mg/dL — AB (ref 70–99)
Potassium: 4.4 mEq/L (ref 3.7–5.3)
Sodium: 139 mEq/L (ref 137–147)

## 2013-12-21 LAB — HEMOGLOBIN A1C
HEMOGLOBIN A1C: 7.6 % — AB (ref <5.7)
MEAN PLASMA GLUCOSE: 171 mg/dL — AB (ref <117)

## 2013-12-21 LAB — PREPARE RBC (CROSSMATCH)

## 2013-12-21 MED ORDER — SODIUM CHLORIDE 0.9 % IV SOLN
INTRAVENOUS | Status: DC
  Administered 2013-12-22 – 2013-12-24 (×2): via INTRAVENOUS

## 2013-12-21 MED ORDER — BOOST / RESOURCE BREEZE PO LIQD
1.0000 | Freq: Two times a day (BID) | ORAL | Status: DC
  Administered 2013-12-21 – 2013-12-26 (×8): 1 via ORAL

## 2013-12-21 MED ORDER — PANTOPRAZOLE SODIUM 40 MG IV SOLR
40.0000 mg | INTRAVENOUS | Status: DC
  Administered 2013-12-21 – 2013-12-24 (×4): 40 mg via INTRAVENOUS
  Filled 2013-12-21 (×5): qty 40

## 2013-12-21 MED ORDER — PEG 3350-KCL-NA BICARB-NACL 420 G PO SOLR
4000.0000 mL | Freq: Once | ORAL | Status: AC
  Administered 2013-12-21: 4000 mL via ORAL

## 2013-12-21 NOTE — Progress Notes (Signed)
CARE MANAGEMENT NOTE 12/21/2013  Patient:  Douglas [name], Douglas [name]   Account Number:  [id number]  Date Initiated:  12/21/2013  Documentation initiated by:  DAVIS,RHONDA  Subjective/Objective Assessment:   pt with hx of lymphoma, admitted with active gi bld and hgb down to 6.9/ no anticoag.'s hx but does take asa 81mg qd, bp different wide , required iv ns boulses in ED/     Action/Plan:   home when stable   Anticipated DC Date:  12/24/2013   Anticipated DC Plan:  HOME/SELF CARE  In-house referral  NA      DC Planning Services  NA      PAC Choice  NA   Choice offered to / List presented to:  NA   DME arranged  NA      DME agency  NA     [place] arranged  NA      [place] agency  NA   Status of service:  In process, will continue to follow Medicare Important Message given?  NA - LOS <3 / Initial given by admissions (If response is "NO", the following Medicare IM given date fields will be blank) Date Medicare IM given:   Date Additional Medicare IM given:    Discharge Disposition:    Per UR Regulation:  Reviewed for med. necessity/level of care/duration of stay  If discussed at [place] of Stay Meetings, dates discussed:    Comments:  [id number]/Rhonda Eldridge Dace, BSN, [place] [phone] Chart Reviewed for discharge and hospital needs. Discharge needs at time of review:  None present will follow for needs. Review of patient progress due on [id number].

## 2013-12-21 NOTE — Progress Notes (Addendum)
[facility] PROGRESS NOTE  [name] FIE:[id number] DOB: Mar 29, 1956 DOA: 12/20/2013 PCP: [name], MD  Assessment/Plan:  1. Lower GI Bleed -suspect diverticular, AVMs, ? Lymphoma/infiltration of the colon could also be a possibility -CT from 12/14 noted -GI consulted, Dr.Hung to see -start clears  2. Acute blood loss anemia -transfused 1 unit overnight and 2nd unit now  3. HTN -BP stable, antihypertensives on hold  4. NHL recently started on chemo 1/20 -Dr.Mohamed notified via epic  5. DM -hold oral hypoglycemics, SSI  6. L leg swelling  -due to L inguinal lymph node infiltration with lymphoma -DVT ruled out with dopplers last week, poor quality study  7. Obesity -lifestyle modification  DVT proph: SCDs  Full COde DIspo: transfer to tele later today   Consultants:  Gi dr.Hung  HPI/Subjective: No further bleeding since yesterday evening  Objective: Filed Vitals:   12/21/13 0800  BP: 116/97  Pulse: 77  Temp:   Resp: 14    Intake/Output Summary (Last 24 hours) at 12/21/13 0808 Last data filed at 12/21/13 0800  Gross per 24 hour  Intake 1731.25 ml  Output    675 ml  Net 1056.25 ml   Filed Weights   12/20/13 2134 12/21/13 0500  Weight: 210 kg (462 lb 15.5 oz) 210 kg (462 lb 15.5 oz)    Exam:   General: AAOx3, obese male, no distress  Cardiovascular: S1S2/RRR  Respiratory: CTAB  Abdomen: obese, Nt, ND, BS present  Musculoskeletal: L groin swelling, swelling in L leg   Data Reviewed: Basic Metabolic Panel:  Recent Labs Lab 12/15/13 1044 12/20/13 1739 12/21/13 0341  NA 136 138 139  K 4.2 4.0 4.4  CL  --  99 103  CO2 30* 26 27  GLUCOSE 188* 261* 134*  BUN 20.6 25* 22  CREATININE 1.2 0.94 0.94  CALCIUM 10.2 8.5 8.2*   Liver Function Tests:  Recent Labs Lab 12/15/13 1044  AST 23  ALT 14  ALKPHOS 68  BILITOT 0.72  PROT 7.7  ALBUMIN 2.9*   No results found for this basename: LIPASE, AMYLASE,  in the  last 168 hours No results found for this basename: AMMONIA,  in the last 168 hours CBC:  Recent Labs Lab 12/15/13 1044 12/20/13 1739 12/21/13 0341  WBC 6.5 10.5 7.7  NEUTROABS 4.8 9.0*  --   HGB 9.9* 7.4* 6.8*  HCT 30.3* 22.8* 20.5*  MCV 85.3 86.0 86.5  PLT 341 288 227   Cardiac Enzymes: No results found for this basename: CKTOTAL, CKMB, CKMBINDEX, TROPONINI,  in the last 168 hours BNP (last 3 results) No results found for this basename: PROBNP,  in the last 8760 hours CBG:  Recent Labs Lab 12/20/13 2219 12/20/13 2313 12/21/13 0326  GLUCAP 157* 148* 122*    Recent Results (from the past 240 hour(s))  MRSA PCR SCREENING     Status: None   Collection Time    12/20/13  9:29 PM      Result Value Range Status   MRSA by PCR NEGATIVE  NEGATIVE Final   Comment:            The GeneXpert MRSA Assay (FDA     approved for NASAL specimens     only), is one component of a     comprehensive MRSA colonization     surveillance program. It is not     intended to diagnose MRSA     infection nor to guide or     monitor treatment  for     MRSA infections.     Studies: No results found.  Scheduled Meds: . atorvastatin  20 mg Oral Daily  . insulin aspart  0-15 Units Subcutaneous Q4H  . pantoprazole (PROTONIX) IV  40 mg Intravenous Q24H   Continuous Infusions: . sodium chloride 125 mL/hr at 12/20/13 2215    Active Problems:   NHL (non-Hodgkin's lymphoma)   HTN (hypertension)   Morbid obesity   Lower GI bleed   Acute blood loss anemia   Diabetes mellitus    Time spent: 79min    Douglas [name]  [facility] Pager [phone]. If 7PM-7AM, please contact night-coverage at [email or web address], password [facility] 12/21/2013, 8:08 AM  LOS: 1 day

## 2013-12-21 NOTE — Consult Note (Signed)
Unassigned patient Reason for Consult: Rectal bleeding and anemia. Referring Physician: [name] is an 58 y.o. male.  HPI: 58 year old, morbidly obese, black male, with multiple medical problems listed below, presented to the [facility] with a history of rectal bleeding that started yesterday afternoon when he had several having bloody BM's with some black stools. He has not had a BM since admission. bHe had some abdominal cramping prior to the onset of the bleed. He give a 1 week history of change in bowel habits when he started having more problems with constipation. He used to have 1 BM per day but lately he has gone 2-3 days without a BM. He denies a previous history of ulcers, jaundice or colitis. He was recently started on chemotherapy on 12/04/13 and gets this one day a week. He is being treated for NHL under the care of Dr. Earlie [name]. He gives a history of having a colonoscopy about 7 years ago by Dr. Gala [name] at [facility]. He claims he had a normal exam. He is on a Aspirin 81 mg per day, that he takes on a daily basis. He denies the use of any other NSAIDS or anticoagulants. He has received 2 units of PRBC's since admission. His hemoglobin dropped to 6.8 gm/dl earlier today.   Past Medical History  Diagnosis Date  . Non Hodgkin's lymphoma January 2009  . Diabetes mellitus without complication   . Hypertension   . Sleep apnea   . Lymphadenopathy, inguinal     left   Past Surgical History  Procedure Laterality Date  . Biopsy for lymphoma Left 11-11-13    groin   His mother died at 87 of heart disease and his father died at 15 of CAD. There is no known family history of colon cancer or colitis.  Social History:  reports that he has never smoked. He does not have any smokeless tobacco history on file. He reports that he does not drink alcohol or use illicit drugs.  Allergies: No Known Allergies  Medications: I have reviewed the patient's current medications.  Results  for orders placed during the hospital encounter of 12/20/13 (from the past 48 hour(s))  CBC WITH DIFFERENTIAL     Status: Abnormal   Collection Time    12/20/13  5:39 PM      Result Value Range   WBC 10.5  4.0 - 10.5 K/uL   RBC 2.65 (*) 4.22 - 5.81 MIL/uL   Hemoglobin 7.4 (*) 13.0 - 17.0 g/dL   HCT 22.8 (*) 39.0 - 52.0 %   MCV 86.0  78.0 - 100.0 fL   MCH 27.9  26.0 - 34.0 pg   MCHC 32.5  30.0 - 36.0 g/dL   RDW 15.5  11.5 - 15.5 %   Platelets 288  150 - 400 K/uL   Neutrophils Relative % 86 (*) 43 - 77 %   Neutro Abs 9.0 (*) 1.7 - 7.7 K/uL   Lymphocytes Relative 7 (*) 12 - 46 %   Lymphs Abs 0.7  0.7 - 4.0 K/uL   Monocytes Relative 6  3 - 12 %   Monocytes Absolute 0.6  0.1 - 1.0 K/uL   Eosinophils Relative 1  0 - 5 %   Eosinophils Absolute 0.1  0.0 - 0.7 K/uL   Basophils Relative 0  0 - 1 %   Basophils Absolute 0.0  0.0 - 0.1 K/uL  BASIC METABOLIC PANEL     Status: Abnormal   Collection  Time    12/20/13  5:39 PM      Result Value Range   Sodium 138  137 - 147 mEq/L   Potassium 4.0  3.7 - 5.3 mEq/L   Chloride 99  96 - 112 mEq/L   CO2 26  19 - 32 mEq/L   Glucose, Bld 261 (*) 70 - 99 mg/dL   BUN 25 (*) 6 - 23 mg/dL   Creatinine, Ser 0.94  0.50 - 1.35 mg/dL   Calcium 8.5  8.4 - 10.5 mg/dL   GFR calc non Af Amer >90  >90 mL/min   GFR calc Af Amer >90  >90 mL/min   Comment: (NOTE)     The eGFR has been calculated using the CKD EPI equation.     This calculation has not been validated in all clinical situations.     eGFR's persistently <90 mL/min signify possible Chronic Kidney     Disease.  PROTIME-INR     Status: None   Collection Time    12/20/13  5:39 PM      Result Value Range   Prothrombin Time 14.0  11.6 - 15.2 seconds   INR 1.10  0.00 - 1.49  TYPE AND SCREEN     Status: None   Collection Time    12/20/13  5:39 PM      Result Value Range   ABO/RH(D) A POS     Antibody Screen NEG     Sample Expiration 12/23/2013     Unit Number [id number]     Blood Component  Type RED CELLS,LR     Unit division 00     Status of Unit ISSUED,FINAL     Transfusion Status OK TO TRANSFUSE     Crossmatch Result Compatible     Unit Number [id number]     Blood Component Type RED CELLS,LR     Unit division 00     Status of Unit ISSUED     Transfusion Status OK TO TRANSFUSE     Crossmatch Result Compatible    ABO/RH     Status: None   Collection Time    12/20/13  5:39 PM      Result Value Range   ABO/RH(D) A POS    HEMOGLOBIN A1C     Status: Abnormal   Collection Time    12/20/13  5:39 PM      Result Value Range   Hemoglobin A1C 7.6 (*) <5.7 %   Comment: (NOTE)                                                                               According to the ADA Clinical Practice Recommendations for 2011, when     HbA1c is used as a screening test:      >=6.5%   Diagnostic of Diabetes Mellitus               (if abnormal result is confirmed)     5.7-6.4%   Increased risk of developing Diabetes Mellitus     References:Diagnosis and Classification of Diabetes Mellitus,Diabetes     KKXF,8182,99(BZJIR 1):S62-S69 and Standards of Medical Care in  Diabetes - 2011,Diabetes Care,2011,34 (Suppl 1):S11-S61.   Mean Plasma Glucose 171 (*) <117 mg/dL   Comment: Performed at [facility]  MRSA PCR SCREENING     Status: None   Collection Time    12/20/13  9:29 PM      Result Value Range   MRSA by PCR NEGATIVE  NEGATIVE   Comment:            The GeneXpert MRSA Assay (FDA     approved for NASAL specimens     only), is one component of a     comprehensive MRSA colonization     surveillance program. It is not     intended to diagnose MRSA     infection nor to guide or     monitor treatment for     MRSA infections.  GLUCOSE, CAPILLARY     Status: Abnormal   Collection Time    12/20/13 10:19 PM      Result Value Range   Glucose-Capillary 157 (*) 70 - 99 mg/dL   Comment 1 Documented in Chart     Comment 2 Notify RN    PREPARE RBC (CROSSMATCH)      Status: None   Collection Time    12/20/13 10:30 PM      Result Value Range   Order Confirmation ORDER PROCESSED BY BLOOD BANK    GLUCOSE, CAPILLARY     Status: Abnormal   Collection Time    12/20/13 11:13 PM      Result Value Range   Glucose-Capillary 148 (*) 70 - 99 mg/dL   Comment 1 Documented in Chart     Comment 2 Notify RN    GLUCOSE, CAPILLARY     Status: Abnormal   Collection Time    12/21/13  3:26 AM      Result Value Range   Glucose-Capillary 122 (*) 70 - 99 mg/dL   Comment 1 Documented in Chart     Comment 2 Notify RN    BASIC METABOLIC PANEL     Status: Abnormal   Collection Time    12/21/13  3:41 AM      Result Value Range   Sodium 139  137 - 147 mEq/L   Potassium 4.4  3.7 - 5.3 mEq/L   Chloride 103  96 - 112 mEq/L   CO2 27  19 - 32 mEq/L   Glucose, Bld 134 (*) 70 - 99 mg/dL   BUN 22  6 - 23 mg/dL   Creatinine, Ser 0.94  0.50 - 1.35 mg/dL   Calcium 8.2 (*) 8.4 - 10.5 mg/dL   GFR calc non Af Amer >90  >90 mL/min   GFR calc Af Amer >90  >90 mL/min   Comment: (NOTE)     The eGFR has been calculated using the CKD EPI equation.     This calculation has not been validated in all clinical situations.     eGFR's persistently <90 mL/min signify possible Chronic Kidney     Disease.  CBC     Status: Abnormal   Collection Time    12/21/13  3:41 AM      Result Value Range   WBC 7.7  4.0 - 10.5 K/uL   RBC 2.37 (*) 4.22 - 5.81 MIL/uL   Hemoglobin 6.8 (*) 13.0 - 17.0 g/dL   Comment: REPEATED TO VERIFY     CRITICAL RESULT CALLED TO, READ BACK BY AND VERIFIED WITH:     MREEVES RN AT [place] ON [id number]  BY DLONG   HCT 20.5 (*) 39.0 - 52.0 %   MCV 86.5  78.0 - 100.0 fL   MCH 28.7  26.0 - 34.0 pg   MCHC 33.2  30.0 - 36.0 g/dL   RDW 15.4  11.5 - 15.5 %   Platelets 227  150 - 400 K/uL  PREPARE RBC (CROSSMATCH)     Status: None   Collection Time    12/21/13  5:30 AM      Result Value Range   Order Confirmation ORDER PROCESSED BY BLOOD BANK    GLUCOSE, CAPILLARY      Status: Abnormal   Collection Time    12/21/13  8:37 AM      Result Value Range   Glucose-Capillary 115 (*) 70 - 99 mg/dL   Comment 1 Documented in Chart     Comment 2 Notify RN    GLUCOSE, CAPILLARY     Status: Abnormal   Collection Time    12/21/13 11:58 AM      Result Value Range   Glucose-Capillary 154 (*) 70 - 99 mg/dL   Comment 1 Documented in Chart     Comment 2 Notify RN    CBC     Status: Abnormal   Collection Time    12/21/13  2:48 PM      Result Value Range   WBC 7.7  4.0 - 10.5 K/uL   RBC 2.76 (*) 4.22 - 5.81 MIL/uL   Hemoglobin 7.7 (*) 13.0 - 17.0 g/dL   HCT 23.7 (*) 39.0 - 52.0 %   MCV 85.9  78.0 - 100.0 fL   MCH 27.9  26.0 - 34.0 pg   MCHC 32.5  30.0 - 36.0 g/dL   RDW 15.7 (*) 11.5 - 15.5 %   Platelets 232  150 - 400 K/uL  GLUCOSE, CAPILLARY     Status: Abnormal   Collection Time    12/21/13  3:58 PM      Result Value Range   Glucose-Capillary 185 (*) 70 - 99 mg/dL   No results found.  Review of Systems  Constitutional: Negative for fever, chills, weight loss, malaise/fatigue and diaphoresis.  HENT: Negative.   Eyes: Negative.   Respiratory: Negative.   Cardiovascular: Negative.   Gastrointestinal: Positive for abdominal pain, constipation, blood in stool and melena. Negative for heartburn, nausea, vomiting and diarrhea.  Genitourinary: Negative.   Skin: Negative.   Neurological: Negative.  Negative for weakness.  Psychiatric/Behavioral: Negative.    Blood pressure 119/45, pulse 82, temperature 98.3 F (36.8 C), temperature source Oral, resp. rate 18, height 5' 10"  (1.778 m), weight 210 kg (462 lb 15.5 oz), SpO2 97.00%. Physical Exam  Constitutional: He is oriented to person, place, and time. He appears well-developed and well-nourished.  HENT:  Head: Normocephalic and atraumatic.  Eyes: Conjunctivae and EOM are normal. Pupils are equal, round, and reactive to light.  Neck: Normal range of motion. Neck supple.  Cardiovascular: Normal rate and  regular rhythm.   Respiratory: Effort normal and breath sounds normal.  GI: Soft. Bowel sounds are normal. He exhibits no distension, no fluid wave, no ascites and no mass. There is no tenderness. There is no rebound and no guarding.  Morbidly obese   Musculoskeletal: Normal range of motion. He exhibits edema.  Left leg swelling  Neurological: He is alert and oriented to person, place, and time.  Skin: Skin is warm and dry.  Psychiatric: He has a normal mood and affect. His behavior is normal. Judgment and  thought content normal.   Assessment/Plan: 1) Rectal bleeding with melena and anemia: will proceed with an EGD/Colonoscopy tomorrow. Will prep tonight. Rule out PUD vs ischemic colitis. 2) Diverticulosis on previous CT scan.  3) HTN/AODM/Hyperlipidemia.  4) Non-Hodgkin's lymphoma-on Chemotherapy since 12/04/13. Douglas [name] 12/21/2013, 6:24 PM

## 2013-12-21 NOTE — Progress Notes (Signed)
Patient using restroom and heart rate went up to 150's sustained while using the restroom. No sob, no cp. Patient is asymptomatic will continue to monitor patient. Paged PA to let them know. Philemon Kingdom D

## 2013-12-21 NOTE — Progress Notes (Signed)
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   PRIOR THERAPY: None. He was followed by observation by his previous oncologist.   CURRENT THERAPY: Systemic chemotherapy with Bendamustine 90 mg/M2 on days 1 and 2 in addition to Rituxan 375 mg/M2 on day 1 every 4 weeks. First dose 12/08/2013   HPI: Mr. Douglas [name] was admitted on 12/20/13 with 5 episodes of  Bright red bloody stools accompanied by crampy generalized abdominal pain and shortness of breath. In the ED he was found to have  a hemoglobin of 7.4 with hematocrit of 22.8. Prior to this he had a hemoglobin 9.9 on 12/15/2013. Patient was administered a bolus of NS.He received one unit of PRBCs on 2/1.  He was not not on anticoagulant. He does take 81 mg of aspirin daily, which was placed on hold. Suspecting diverticular bleed, he was admitted to the ICU. GI evaluation is pending. We have been informed of the patient's admission. He states that last bloody BM was last evening, none today. Shortness of breath improved with transfusion. No cardiac complaints. Resting comfortably.     MEDICATIONS:  Scheduled Meds: . atorvastatin  20 mg Oral Daily  . insulin aspart  0-15 Units Subcutaneous Q4H  . pantoprazole (PROTONIX) IV  40 mg Intravenous Q24H   Continuous Infusions: . sodium chloride 50 mL/hr at 12/21/13 0846   PRN Meds:.acetaminophen, ondansetron (ZOFRAN) IV, ondansetron, oxyCODONE  ALLERGIES:  No Known Allergies   PHYSICAL EXAMINATION:   Filed Vitals:   12/21/13 0900  BP: 129/49  Pulse: 75  Temp:   Resp: 11   Filed Weights   12/20/13 2134 12/21/13 0500  Weight: 462 lb 15.5 oz (210 kg) 462 lb 15.5 oz (51 kg)    58 year old  in no acute distress A. and O. x3 General well-developed and  well-nourished  HEENT: Normocephalic, atraumatic, PERRLA.sclerae anicteric Oral cavity without thrush or lesions. Neck supple. no thyromegaly, no cervical or supraclavicular adenopathy  Lungs clear bilaterally . No wheezing, rhonchi or rales. Cardiac: regular rate and rhythm,no murmur , rubs or gallops Abdomen obese  soft nontender , bowel sounds x4. No HSM Extremities no clubbing cyanosis.  LLE edema. No bruising or petechial rash Neuro: non focal  LABORATORY/RADIOLOGY DATA:   Recent Labs Lab 12/15/13 1044 12/20/13 1739 12/21/13 0341  WBC 6.5 10.5 7.7  HGB 9.9* 7.4* 6.8*  HCT 30.3* 22.8* 20.5*  PLT 341 288 227  MCV 85.3 86.0 86.5  MCH 27.7 27.9 28.7  MCHC 32.5 32.5 33.2  RDW 14.4 15.5 15.4  LYMPHSABS 0.1* 0.7  --   MONOABS 0.6 0.6  --   EOSABS 1.0* 0.1  --   BASOSABS 0.0 0.0  --     CMP    Recent Labs Lab 12/15/13 1044 12/20/13 1739 12/21/13 0341  NA 136 138 139  K 4.2 4.0 4.4  CL  --  99 103  CO2 30* 26 27  GLUCOSE 188* 261* 134*  BUN 20.6 25* 22  CREATININE 1.2 0.94 0.94  CALCIUM 10.2 8.5 8.2*  AST 23  --   --   ALT 14  --   --   ALKPHOS 68  --   --   BILITOT 0.72  --   --         Component Value Date/Time   BILITOT  0.72 12/15/2013 1044   BILITOT 0.5 10/30/2013 1709        Component Value Date/Time   ESRSEDRATE 30* 05/13/2008 1330     Recent Labs Lab 12/20/13 1739  INR 1.10      Liver Function Tests:  Recent Labs Lab 12/15/13 1044  AST 23  ALT 14  ALKPHOS 68  BILITOT 0.72  PROT 7.7  ALBUMIN 2.9*   No results found for this basename: LIPASE, AMYLASE,  in the last 168 hours No results found for this basename: AMMONIA,  in the last 168 hours  CBG:  Recent Labs Lab 12/20/13 2219 12/20/13 2313 12/21/13 0326 12/21/13 0837  GLUCAP 157* 148* 122* 115*   Hgb A1c  Recent Labs  12/20/13 1739  HGBA1C 7.6*     Radiology Studies:  Ct Chest W Contrast  12/02/2013   CLINICAL DATA:  Lymphoma.  EXAM: CT CHEST WITH CONTRAST   TECHNIQUE: Multidetector CT imaging of the chest was performed during intravenous contrast administration.  CONTRAST:  35mL OMNIPAQUE IOHEXOL 300 MG/ML  SOLN  COMPARISON:  02/06/2008.  FINDINGS: The chest wall is unremarkable. No supraclavicular or axillary lymphadenopathy. Small scattered lymph nodes are noted. The bony thorax is intact. No destructive bone lesions or spinal canal compromise. Advanced degenerative changes involving the thoracic spine with anterior and lateral bridging osteophytes.  The heart is normal in size. No pericardial effusion. No mediastinal or hilar mass or lymphadenopathy. The aorta is normal in caliber. No dissection. The esophagus is grossly normal.  Examination of the lung parenchyma demonstrates to pulmonary nodules. There is a 12.5 mm lesion in the right upper lobe adjacent to the pleura with a slight a low-fat interstitial change a rounded. The second nodule is in the right middle lobe on image number 23 and measures approximately 6 mm. A sub 3 mm subpleural pulmonary nodule is noted in the right upper lobe on image number 28. The left lung is clear. No pleural effusion. No acute pulmonary findings. No interstitial lung disease or bronchiectasis chief  The upper abdomen is grossly normal.  IMPRESSION: Three right-sided pulmonary nodules. The largest measures 12.5 mm in the right upper lobe and appears to have some inflammation around it. Short-term followup noncontrast chest CT is recommended in 3 months to re-evaluate.  No supraclavicular, axillary, mediastinal or hilar lymphadenopathy.   Electronically Signed   By: Kalman Jewels M.D.   On: 12/02/2013 15:00   US Scrotum  11/27/2013   CLINICAL DATA:  58 year old male with history of non-Hodgkin's lymphoma, large left groin mass status post core biopsy in December. Pathology revealed abnormal lymphoproliferative process. Scrotal mass. Initial encounter.  EXAM: SCROTAL ULTRASOUND  DOPPLER ULTRASOUND OF THE TESTICLES  TECHNIQUE:  Complete ultrasound examination of the testicles, epididymis, and other scrotal structures was performed. Color and spectral Doppler ultrasound were also utilized to evaluate blood flow to the testicles.  COMPARISON:  Pelvis CT 10/30/2013.  FINDINGS: Large body habitus. Severe scrotal wall thickening (up to 4.5 cm, see image 6). No sonographic evidence of subcutaneous gas.  On 10/30/2013 a confluent left inguinal soft tissue mass plus left pelvic side wall lymphadenopathy was demonstrated. The mass was incompletely visible but involves muscle, subcutaneous tissue, and possibly the dermis.  On some images of the left scrotal wall today the technologist questioned peristalsis (see series 2 semi images), but this appears to represent real-time vascularity rather than peristalsis, and there was no inguinal hernia on 10/30/2013.  Right testicle  Measurements: 3.9 x 2.9 x 3.0  cm. No mass or microlithiasis visualized.  Left testicle  Measurements: 3.7 x 2.5 x 2.6 cm. No mass or microlithiasis visualized.  Right epididymis: Echogenic, but nonenlarged. No evidence of hypervascularity (image 33).  Left epididymis: Echogenic and mildly enlarged comparing to the right side, thickness up to 11 mm. Mild hypervascularity compared to the right side (image 52).  Hydrocele:  None visualized.  Varicocele:  None visualized.  Pulsed Doppler interrogation of both testes demonstrates low resistance arterial and venous waveforms bilaterally.  IMPRESSION: 1. Severe scrotal wall thickening up to 4.5 cm, with an infiltrative appearance similar to the left inguinal process demonstrated on 11/02/2013. Repeat pelvis CT with IV contrast to include the entire scrotum may be beneficial in providing overall characterization of this process.  2. No testicular mass.  No hydrocele.  3. The left epididymis does appear mildly enlarged and hypervascular, but the etiology in significance is unclear.   Electronically Signed   By: Lars Pinks M.D.   On:  11/27/2013 12:08   [place] Art/ven Flow Abd Pelv Doppler  11/27/2013   CLINICAL DATA:  58 year old male with history of non-Hodgkin's lymphoma, large left groin mass status post core biopsy in December. Pathology revealed abnormal lymphoproliferative process. Scrotal mass. Initial encounter.  EXAM: SCROTAL ULTRASOUND  DOPPLER ULTRASOUND OF THE TESTICLES  TECHNIQUE: Complete ultrasound examination of the testicles, epididymis, and other scrotal structures was performed. Color and spectral Doppler ultrasound were also utilized to evaluate blood flow to the testicles.  COMPARISON:  Pelvis CT 10/30/2013.  FINDINGS: Large body habitus. Severe scrotal wall thickening (up to 4.5 cm, see image 6). No sonographic evidence of subcutaneous gas.  On 10/30/2013 a confluent left inguinal soft tissue mass plus left pelvic side wall lymphadenopathy was demonstrated. The mass was incompletely visible but involves muscle, subcutaneous tissue, and possibly the dermis.  On some images of the left scrotal wall today the technologist questioned peristalsis (see series 2 semi images), but this appears to represent real-time vascularity rather than peristalsis, and there was no inguinal hernia on 10/30/2013.  Right testicle  Measurements: 3.9 x 2.9 x 3.0 cm. No mass or microlithiasis visualized.  Left testicle  Measurements: 3.7 x 2.5 x 2.6 cm. No mass or microlithiasis visualized.  Right epididymis: Echogenic, but nonenlarged. No evidence of hypervascularity (image 33).  Left epididymis: Echogenic and mildly enlarged comparing to the right side, thickness up to 11 mm. Mild hypervascularity compared to the right side (image 52).  Hydrocele:  None visualized.  Varicocele:  None visualized.  Pulsed Doppler interrogation of both testes demonstrates low resistance arterial and venous waveforms bilaterally.  IMPRESSION: 1. Severe scrotal wall thickening up to 4.5 cm, with an infiltrative appearance similar to the left inguinal process demonstrated  on 11/02/2013. Repeat pelvis CT with IV contrast to include the entire scrotum may be beneficial in providing overall characterization of this process.  2. No testicular mass.  No hydrocele.  3. The left epididymis does appear mildly enlarged and hypervascular, but the etiology in significance is unclear.   Electronically Signed   By: Lars Pinks M.D.   On: 11/27/2013 12:08       ASSESSMENT AND PLAN:   76. 58 year old morbidly obese African American male with progressive follicular non-Hodgkin lymphoma initially diagnosed in 2009 but the patient has not received any treatment since that time and was on observation. He was seen in the past by Dr. Tressie Stalker and was lost to follow up with him for almost 4 years.  S/p C1systemic chemotherapy with Bendamustine 90 mg/M2 on days 1 and 2 as well as Rituxan 375 mg/M2 on day 1 every 4 weeks.   2.LLE edema, last dopplers on 12/01/13 negative for DVT.  3. GIB, ? Diverticular disease, GI (Dr. Benson Norway) to see.   4. Anemia secondary to GIB, supportive transfusions.   Other medical issues as per admitting team. Dr. Julien Nordmann to write an addendum to this note.    Rondel Jumbo, PA-C 12/21/2013, 9:54 AM  ADDENDUM: Hematology/Oncology Attending: The patient is seen and examined. I agree with the above note. This is a very pleasant 58 years old morbidly obese African American male who was diagnosed with recurrent follicular low-grade non-Hodgkin lymphoma that was initially diagnosed in January of 2009 but the patient had not received any treatment for his condition. I saw him recently and recommended for the patient's systemic therapy with bendamustine and Rituxan. He is status post 1 cycle started last week and tolerated his treatment fairly well. He was admitted with lower gastrointestinal bleed of unclear etiology at this point but questionable for diverticular disease. He is expected to be seen by Dr. Benson Norway later today for evaluation and consideration of colonoscopy.  The patient received 2 units of PRBCs transfusion and his hemoglobin is up to 7.7 G./DL. His white blood count and platelets are within the normal range. Thank you so much for taking good care of Douglas [name], I will continue to monitor the patient closely with you.  Disclaimer: This note was dictated with voice recognition software. Similar sounding words can inadvertently be transcribed and may not be corrected upon review.

## 2013-12-21 NOTE — Progress Notes (Signed)
RT set CPAP machine at [facility] per home settings. Pt is on room air and states he does not use O2 in his CPAP at home. Sterile water was added for humidification. Pt states he is not ready to be placed on CPAP just yet & does not require any assistance with application of home mask. RT will continue to monitor as needed.

## 2013-12-21 NOTE — Progress Notes (Signed)
INITIAL NUTRITION ASSESSMENT  DOCUMENTATION CODES Per approved criteria  -Morbid Obesity   INTERVENTION: - Diet advancement per MD - Resource Breeze BID - Assisted pt with ordering lunch - Denies any nutrition education concerns at this time. Usual daily intake includes mostly water as main beverage and meals consisting of sandwiches of low fat meats on whole grain bread.  - Will continue to monitor   NUTRITION DIAGNOSIS: Inadequate oral intake related to clear liquid diet as evidenced by diet order.   Goal: Advance diet as tolerated to diabetic diet  Monitor:  Weights, labs, diet advancement  Reason for Assessment: Malnutrition screening tool   58 y.o. male  Admitting Dx: Bloody stools  ASSESSMENT: Pt with history of follicular low-grade non-Hodgkin's lymphoma that was diagnosed in January of 2009, with disease progression in 2014, was recently started on systemic chemotherapy with Bendamustine and Rituxan receiving his first dose on 12/08/2013. He had 5 episodes of bloody stools yesterday afternoon, characterized as bright red blood, moderate amount, and associated with crampy generalized abdominal pain. He also reported associated shortness of breath and diaphoresis.  Met with pt who reports having a good appetite at home, eats 3 meals/day and a snack in between. Reports his weight has gone up and down due to significant swelling in his left leg. Reports his blood sugars have been under control. Denies any BMs today.   Lab Results  Component Value Date   HGBA1C 7.6* 12/20/2013     Height: Ht Readings from Last 1 Encounters:  12/20/13 5' 10"  (1.778 m)    Weight: Wt Readings from Last 1 Encounters:  12/21/13 462 lb 15.5 oz (210 kg)    Ideal Body Weight: 166 lb   % Ideal Body Weight: 278%  Wt Readings from Last 10 Encounters:  12/21/13 462 lb 15.5 oz (210 kg)  12/04/13 481 lb 8 oz (218.407 kg)  12/01/13 481 lb 8 oz (218.407 kg)  11/10/13 463 lb 8 oz (210.242 kg)   10/31/13 472 lb 14.2 oz (214.5 kg)    Usual Body Weight: 481 lb per pt  % Usual Body Weight: 96%  BMI:  Body mass index is 66.43 kg/(m^2). Class III extreme obesity  Estimated Nutritional Needs: Kcal: 1900-2100 Protein: 90-110g Fluid: 1.9-2.1L/day  Skin: +2 RLE edema, +3 LLE edema, +2 perineal edema  Diet Order: Clear Liquid  EDUCATION NEEDS: -No education needs identified at this time   Intake/Output Summary (Last 24 hours) at 12/21/13 1036 Last data filed at 12/21/13 1000  Gross per 24 hour  Intake 2128.75 ml  Output    925 ml  Net 1203.75 ml    Last BM: 2/1  Labs:   Recent Labs Lab 12/15/13 1044 12/20/13 1739 12/21/13 0341  NA 136 138 139  K 4.2 4.0 4.4  CL  --  99 103  CO2 30* 26 27  BUN 20.6 25* 22  CREATININE 1.2 0.94 0.94  CALCIUM 10.2 8.5 8.2*  GLUCOSE 188* 261* 134*    CBG (last 3)   Recent Labs  12/20/13 2313 12/21/13 0326 12/21/13 0837  GLUCAP 148* 122* 115*    Scheduled Meds: . atorvastatin  20 mg Oral Daily  . insulin aspart  0-15 Units Subcutaneous Q4H  . pantoprazole (PROTONIX) IV  40 mg Intravenous Q24H    Continuous Infusions: . sodium chloride 50 mL/hr at 12/21/13 5462    Past Medical History  Diagnosis Date  . Non Hodgkin's lymphoma January 2009  . Diabetes mellitus without complication   .  Hypertension   . Sleep apnea   . Lymphadenopathy, inguinal     left    Past Surgical History  Procedure Laterality Date  . Biopsy for lymphoma Left 11/02/13    groin    Mikey College MS, RD, LDN [phone] Pager [phone] After Hours Pager

## 2013-12-22 ENCOUNTER — Encounter (HOSPITAL_COMMUNITY): Payer: Self-pay | Admitting: Anesthesiology

## 2013-12-22 ENCOUNTER — Other Ambulatory Visit: Payer: BC Managed Care – PPO

## 2013-12-22 ENCOUNTER — Encounter (HOSPITAL_COMMUNITY)
Admission: EM | Disposition: A | Discharge status: Home Health | Payer: Self-pay | Source: Home / Self Care | Attending: Internal Medicine

## 2013-12-22 ENCOUNTER — Ambulatory Visit: Payer: BC Managed Care – PPO | Admitting: Internal Medicine

## 2013-12-22 HISTORY — PX: ESOPHAGOGASTRODUODENOSCOPY: SHX5428

## 2013-12-22 HISTORY — PX: COLONOSCOPY: SHX5424

## 2013-12-22 LAB — BASIC METABOLIC PANEL
BUN: 21 mg/dL (ref 6–23)
CALCIUM: 7.8 mg/dL — AB (ref 8.4–10.5)
CHLORIDE: 102 meq/L (ref 96–112)
CO2: 26 mEq/L (ref 19–32)
Creatinine, Ser: 0.93 mg/dL (ref 0.50–1.35)
GFR calc Af Amer: >90 mL/min (ref >90)
GFR calc non Af Amer: >90 mL/min (ref >90)
GLUCOSE: 150 mg/dL — AB (ref 70–99)
Potassium: 4 mEq/L (ref 3.7–5.3)
SODIUM: 140 meq/L (ref 137–147)

## 2013-12-22 LAB — PREPARE RBC (CROSSMATCH)

## 2013-12-22 LAB — CBC
HEMATOCRIT: 19.9 % — AB (ref 39.0–52.0)
HEMOGLOBIN: 6.6 g/dL — AB (ref 13.0–17.0)
MCH: 28.4 pg (ref 26.0–34.0)
MCHC: 33.2 g/dL (ref 30.0–36.0)
MCV: 85.8 fL (ref 78.0–100.0)
PLATELETS: 183 10*3/uL (ref 150–400)
RBC: 2.32 MIL/uL — AB (ref 4.22–5.81)
RDW: 15.9 % — ABNORMAL HIGH (ref 11.5–15.5)
WBC: 12.7 10*3/uL — ABNORMAL HIGH (ref 4.0–10.5)

## 2013-12-22 LAB — GLUCOSE, CAPILLARY
GLUCOSE-CAPILLARY: 141 mg/dL — AB (ref 70–99)
Glucose-Capillary: 112 mg/dL — ABNORMAL HIGH (ref 70–99)
Glucose-Capillary: 127 mg/dL — ABNORMAL HIGH (ref 70–99)
Glucose-Capillary: 141 mg/dL — ABNORMAL HIGH (ref 70–99)
Glucose-Capillary: 143 mg/dL — ABNORMAL HIGH (ref 70–99)
Glucose-Capillary: 148 mg/dL — ABNORMAL HIGH (ref 70–99)

## 2013-12-22 SURGERY — COLONOSCOPY
Anesthesia: Monitor Anesthesia Care

## 2013-12-22 MED ORDER — ONDANSETRON HCL 4 MG/2ML IJ SOLN
INTRAMUSCULAR | Status: AC
  Filled 2013-12-22: qty 2

## 2013-12-22 MED ORDER — MIDAZOLAM HCL 2 MG/2ML IJ SOLN
INTRAMUSCULAR | Status: AC
  Filled 2013-12-22: qty 2

## 2013-12-22 MED ORDER — POLYETHYLENE GLYCOL 3350 17 GM/SCOOP PO POWD
1.0000 | Freq: Once | ORAL | Status: AC
  Administered 2013-12-23: 1 via ORAL
  Filled 2013-12-22: qty 255

## 2013-12-22 MED ORDER — SODIUM CHLORIDE 0.9 % IJ SOLN
10.0000 mL | INTRAMUSCULAR | Status: DC | PRN
  Administered 2013-12-22 – 2013-12-26 (×10): 10 mL

## 2013-12-22 MED ORDER — FENTANYL CITRATE 0.05 MG/ML IJ SOLN
INTRAMUSCULAR | Status: AC
  Filled 2013-12-22: qty 2

## 2013-12-22 MED ORDER — KETAMINE HCL 10 MG/ML IJ SOLN
INTRAMUSCULAR | Status: AC
  Filled 2013-12-22: qty 1

## 2013-12-22 MED ORDER — PROPOFOL 10 MG/ML IV BOLUS
INTRAVENOUS | Status: AC
  Filled 2013-12-22: qty 20

## 2013-12-22 NOTE — Progress Notes (Signed)
CRITICAL VALUE ALERT  Critical value received:  Hgb 6.6  Date of notification:  12/22/2013 (from [place] in lab)  Time of notification:  0810  Critical value read back:yes  Nurse who received alert:  Junious Dresser, RN   MD notified (1st page):  Domenic Polite, MD  Time of first page:  0810  MD notified (2nd page):  Time of second page:  Responding MD:  Domenic Polite, MD  Time MD responded:  [phone]

## 2013-12-22 NOTE — Progress Notes (Addendum)
[facility] PROGRESS NOTE  Douglas [name] GMW:[id number] DOB: Nov 26, 1955 DOA: 12/20/2013 PCP: [name], MD  Brief narrative: Douglas [name] is a 45/I with PMH of follicular low-grade non-Hodgkin's lymphoma recently started on systemic chemotherapy first dose on 12/08/2013. Was admitted following 5 episodes of hematochezia. He was seen by GI and received prep last night for EGD/colonoscopy, currently receiving 3rd unit of blood.  Assessment/Plan:  1. Lower GI Bleed -suspect diverticular, AVMs, ? Lymphoma/infiltration of the colon could also be a possibility -CT from 12/14 noted -GI Dr.Mann following, appreciate consult -plan for EGD and Colonoscopy today -multiple episodes of BRBPR after prep started last night  2. Acute blood loss anemia -due to 1 -transfused 2 units PRBC so far -1 more unit this am -monitor CBC q8  3. HTN -BP stable, antihypertensives on hold  4. NHL recently started on chemo 1/20 -Dr.Mohamed notified via epic  5. DM -hold oral hypoglycemics, SSI  6. L leg swelling  -due to L inguinal lymph node infiltration with lymphoma -DVT ruled out with dopplers last week, poor quality study  7. Obesity -lifestyle modification  DVT proph: SCDs  Full COde DIspo: keep on tele today  Consultants:  Gi dr.Hung  HPI/Subjective: Multiple episodes of bleeding last night after starting prep for colonsocopy  Objective: Filed Vitals:   12/22/13 0422  BP: 109/40  Pulse: 97  Temp: 98.2 F (36.8 C)  Resp: 18    Intake/Output Summary (Last 24 hours) at 12/22/13 1057 Last data filed at 12/22/13 0700  Gross per 24 hour  Intake 2499.33 ml  Output   1575 ml  Net 924.33 ml   Filed Weights   12/21/13 0500 12/22/13 0500 12/22/13 0701  Weight: 210 kg (462 lb 15.5 oz) 218.588 kg (481 lb 14.4 oz) 212.692 kg (468 lb 14.4 oz)    Exam:   General: AAOx3, obese male, no distress  Cardiovascular: S1S2/RRR  Respiratory: CTAB  Abdomen:  obese, Nt, ND, BS present  Musculoskeletal: L groin swelling-lymphadenopathy, swelling in L leg   Data Reviewed: Basic Metabolic Panel:  Recent Labs Lab 12/20/13 1739 12/21/13 0341 12/22/13 0515  NA 138 139 140  K 4.0 4.4 4.0  CL 99 103 102  CO2 26 27 26   GLUCOSE 261* 134* 150*  BUN 25* 22 21  CREATININE 0.94 0.94 0.93  CALCIUM 8.5 8.2* 7.8*   Liver Function Tests: No results found for this basename: AST, ALT, ALKPHOS, BILITOT, PROT, ALBUMIN,  in the last 168 hours No results found for this basename: LIPASE, AMYLASE,  in the last 168 hours No results found for this basename: AMMONIA,  in the last 168 hours CBC:  Recent Labs Lab 12/20/13 1739 12/21/13 0341 12/21/13 1448 12/21/13 2301 12/22/13 0754  WBC 10.5 7.7 7.7 12.1* 12.7*  NEUTROABS 9.0*  --   --   --   --   HGB 7.4* 6.8* 7.7* 7.3* 6.6*  HCT 22.8* 20.5* 23.7* 22.3* 19.9*  MCV 86.0 86.5 85.9 86.4 85.8  PLT 288 227 232 201 183   Cardiac Enzymes: No results found for this basename: CKTOTAL, CKMB, CKMBINDEX, TROPONINI,  in the last 168 hours BNP (last 3 results) No results found for this basename: PROBNP,  in the last 8760 hours CBG:  Recent Labs Lab 12/21/13 1558 12/21/13 2000 12/22/13 0035 12/22/13 0411 12/22/13 0733  GLUCAP 185* 156* 148* 143* 112*    Recent Results (from the past 240 hour(s))  MRSA PCR SCREENING     Status: None  Collection Time    12/20/13  9:29 PM      Result Value Range Status   MRSA by PCR NEGATIVE  NEGATIVE Final   Comment:            The GeneXpert MRSA Assay (FDA     approved for NASAL specimens     only), is one component of a     comprehensive MRSA colonization     surveillance program. It is not     intended to diagnose MRSA     infection nor to guide or     monitor treatment for     MRSA infections.     Studies: No results found.  Scheduled Meds: . atorvastatin  20 mg Oral Daily  . feeding supplement (RESOURCE BREEZE)  1 Container Oral BID BM  . insulin  aspart  0-15 Units Subcutaneous Q4H  . pantoprazole (PROTONIX) IV  40 mg Intravenous Q24H   Continuous Infusions: . sodium chloride 50 mL/hr at 12/21/13 1126  . sodium chloride 20 mL/hr at 12/21/13 2102    Active Problems:   NHL (non-Hodgkin's lymphoma)   HTN (hypertension)   Morbid obesity   Lower GI bleed   Acute blood loss anemia   Diabetes mellitus    Time spent: 18min    Douglas [name]  [facility] Pager [phone]. If 7PM-7AM, please contact night-coverage at [email or web address], password [facility] 12/22/2013, 10:57 AM  LOS: 2 days

## 2013-12-22 NOTE — Progress Notes (Signed)
Subjective: No acute events.  Bleeding may be slowing down.  Objective: Vital signs in last 24 hours: Temp:  [98.2 F (36.8 C)-98.4 F (36.9 C)] 98.2 F (36.8 C) (02/03 1331) Pulse Rate:  [82-98] 90 (02/03 1331) Resp:  [16-19] 16 (02/03 1331) BP: (109-128)/(40-64) 128/52 mmHg (02/03 1331) SpO2:  [97 %-100 %] 100 % (02/03 1331) Weight:  [468 lb 14.4 oz (212.692 kg)-481 lb 14.4 oz (218.588 kg)] 468 lb 14.4 oz (212.692 kg) (02/03 0701) Last BM Date: 12/22/13  Intake/Output from previous day: 02/02 0701 - 02/03 0700 In: 3146.8 [P.O.:1600; I.V.:1421.8; Blood:125] Out: 3235 [Urine:1825] Intake/Output this shift:    General appearance: alert and no distress GI: morbidly obese  Lab Results:  Recent Labs  12/21/13 1448 12/21/13 2301 12/22/13 0754  WBC 7.7 12.1* 12.7*  HGB 7.7* 7.3* 6.6*  HCT 23.7* 22.3* 19.9*  PLT 232 201 183   BMET  Recent Labs  12/20/13 1739 12/21/13 0341 12/22/13 0515  NA 138 139 140  K 4.0 4.4 4.0  CL 99 103 102  CO2 26 27 26   GLUCOSE 261* 134* 150*  BUN 25* 22 21  CREATININE 0.94 0.94 0.93  CALCIUM 8.5 8.2* 7.8*   LFT No results found for this basename: PROT, ALBUMIN, AST, ALT, ALKPHOS, BILITOT, BILIDIR, IBILI,  in the last 72 hours PT/INR  Recent Labs  12/20/13 1739  LABPROT 14.0  INR 1.10   Hepatitis Panel No results found for this basename: HEPBSAG, HCVAB, HEPAIGM, HEPBIGM,  in the last 72 hours C-Diff No results found for this basename: CDIFFTOX,  in the last 72 hours Fecal Lactopherrin No results found for this basename: FECLLACTOFRN,  in the last 72 hours  Studies/Results: No results found.  Medications:  Scheduled: . atorvastatin  20 mg Oral Daily  . feeding supplement (RESOURCE BREEZE)  1 Container Oral BID BM  . insulin aspart  0-15 Units Subcutaneous Q4H  . pantoprazole (PROTONIX) IV  40 mg Intravenous Q24H   Continuous: . sodium chloride 50 mL/hr at 12/21/13 1126  . sodium chloride 20 mL/hr at 12/21/13 2102     Assessment/Plan: 1) GI bleed. 2) NHL.   The patient is stable.  He prepped for the colonoscopy and he reports that his bleeding was lightening up.  The patient did not have IV access and he also did not have his additional blood transfusion.  He was supposed to undergo an EGD/colonoscopy with MAC.  Unfortunately with the IV access issues, morbid obesity, and other patient responsibilities I cannot perform the procedure until Thursday at 2 PM in the OR.  His large size places him at risk for respiratory compromise using conscious sedation.  The PICC line was just placed and he will now receive his additional blood transfusion.  If there is any acute decompensation before Thursday, he will have the procedures performed emergently in the OR.  Plan: 1) Clear liquid diet. 2) Reprep with a Miralax prep Wednesday evening.  LOS: 2 days   Dewel Lotter D 12/22/2013, 3:06 PM

## 2013-12-22 NOTE — Progress Notes (Signed)
Peripherally Inserted Central Catheter/Midline Placement  The IV Nurse has discussed with the patient and/or persons authorized to consent for the patient, the purpose of this procedure and the potential benefits and risks involved with this procedure.  The benefits include less needle sticks, lab draws from the catheter and patient may be discharged home with the catheter.  Risks include, but not limited to, infection, bleeding, blood clot (thrombus formation), and puncture of an artery; nerve damage and irregular heat beat.  Alternatives to this procedure were also discussed.  PICC/Midline Placement Documentation        Douglas [name] 12/22/2013, 2:17 PM

## 2013-12-22 NOTE — Care Management Note (Addendum)
    Page 1 of 2   12/24/2013     1:38:06 PM   CARE MANAGEMENT NOTE 12/24/2013  Patient:  RAM, HAUGAN   Account Number:  [id number]  Date Initiated:  12/21/2013  Documentation initiated by:  DAVIS,RHONDA  Subjective/Objective Assessment:   pt with hx of lymphoma, admitted with active gi bld and hgb down to 6.9/ no anticoag.'s hx but does take asa 81mg qd, bp different wide , required iv ns boulses in ED/     Action/Plan:   home when stable   Anticipated DC Date:  12/25/2013   Anticipated DC Plan:  HOME/SELF CARE  In-house referral  NA      DC Planning Services  CM consult      PAC Choice  NA   Choice offered to / List presented to:  NA   DME arranged  NA      DME agency  NA     [place] arranged  NA      [place] agency  NA   Status of service:  In process, will continue to follow Medicare Important Message given?  NA - LOS <3 / Initial given by admissions (If response is "NO", the following Medicare IM given date fields will be blank) Date Medicare IM given:   Date Additional Medicare IM given:    Discharge Disposition:    Per UR Regulation:  Reviewed for med. necessity/level of care/duration of stay  If discussed at [place] of Stay Meetings, dates discussed:    Comments:  12/24/13 Jaz Mallick RN,BSN NCM [phone] EGD,COLONOSCOPY TODAY.GI FOLLOWING.NO ANTICIPATED D/C NEEDS.  12/22/13 Costa Jha RN,BSN NCM [phone] TRANSFER FROM SDU.NO ANTICIPATED D/C NEEDS.  [id number] Rosana Hoes, RN, BSN, [place] [phone] Chart Reviewed for discharge and hospital needs. Discharge needs at time of review:  None present will follow for needs. Review of patient progress due on [id number].

## 2013-12-22 NOTE — Anesthesia Preprocedure Evaluation (Deleted)
Anesthesia Evaluation  Patient identified by MRN, date of birth, ID band Patient awake    Reviewed: Allergy & Precautions, H&P , NPO status , Patient's Chart, lab work & pertinent test results  Airway       Dental   Pulmonary sleep apnea ,          Cardiovascular hypertension,     Neuro/Psych negative neurological ROS  negative psych ROS   GI/Hepatic negative GI ROS, Neg liver ROS,   Endo/Other  diabetes, Type 1, Insulin DependentMorbid obesity  Renal/GU negative Renal ROS  negative genitourinary   Musculoskeletal negative musculoskeletal ROS (+)   Abdominal   Peds  Hematology  (+) Blood dyscrasia, anemia , NonHodgkins lymphoma   Anesthesia Other Findings   Reproductive/Obstetrics                           Anesthesia Physical Anesthesia Plan  ASA: III  Anesthesia Plan: MAC   Post-op Pain Management:    Induction:   Airway Management Planned: Simple Face Mask  Additional Equipment:   Intra-op Plan:   Post-operative Plan: Extubation in OR  Informed Consent: I have reviewed the patients History and Physical, chart, labs and discussed the procedure including the risks, benefits and alternatives for the proposed anesthesia with the patient or authorized representative who has indicated his/her understanding and acceptance.   Dental advisory given  Plan Discussed with: CRNA  Anesthesia Plan Comments:         Anesthesia Quick Evaluation

## 2013-12-23 ENCOUNTER — Encounter (HOSPITAL_COMMUNITY): Payer: Self-pay | Admitting: Gastroenterology

## 2013-12-23 LAB — CBC
HCT: 19.6 % — ABNORMAL LOW (ref 39.0–52.0)
HEMATOCRIT: 19 % — AB (ref 39.0–52.0)
HEMATOCRIT: 19.9 % — AB (ref 39.0–52.0)
Hemoglobin: 6.6 g/dL — CL (ref 13.0–17.0)
Hemoglobin: 6.3 g/dL — CL (ref 13.0–17.0)
Hemoglobin: 6.5 g/dL — CL (ref 13.0–17.0)
MCH: 29.1 pg (ref 26.0–34.0)
MCH: 28.8 pg (ref 26.0–34.0)
MCH: 28.5 pg (ref 26.0–34.0)
MCHC: 33.7 g/dL (ref 30.0–36.0)
MCHC: 33.2 g/dL (ref 30.0–36.0)
MCHC: 32.7 g/dL (ref 30.0–36.0)
MCV: 86.3 fL (ref 78.0–100.0)
MCV: 86.8 fL (ref 78.0–100.0)
MCV: 87.3 fL (ref 78.0–100.0)
PLATELETS: 183 10*3/uL (ref 150–400)
PLATELETS: 179 10*3/uL (ref 150–400)
Platelets: 166 10*3/uL (ref 150–400)
RBC: 2.27 MIL/uL — AB (ref 4.22–5.81)
RBC: 2.19 MIL/uL — ABNORMAL LOW (ref 4.22–5.81)
RBC: 2.28 MIL/uL — ABNORMAL LOW (ref 4.22–5.81)
RDW: 15.9 % — ABNORMAL HIGH (ref 11.5–15.5)
RDW: 16.1 % — ABNORMAL HIGH (ref 11.5–15.5)
RDW: 16.2 % — AB (ref 11.5–15.5)
WBC: 11.7 10*3/uL — AB (ref 4.0–10.5)
WBC: 11.1 10*3/uL — ABNORMAL HIGH (ref 4.0–10.5)
WBC: 11.2 10*3/uL — AB (ref 4.0–10.5)

## 2013-12-23 LAB — BASIC METABOLIC PANEL
BUN: 12 mg/dL (ref 6–23)
CO2: 26 meq/L (ref 19–32)
Calcium: 8 mg/dL — ABNORMAL LOW (ref 8.4–10.5)
Chloride: 102 mEq/L (ref 96–112)
Creatinine, Ser: 0.92 mg/dL (ref 0.50–1.35)
GFR calc Af Amer: >90 mL/min (ref >90)
GFR calc non Af Amer: >90 mL/min (ref >90)
Glucose, Bld: 116 mg/dL — ABNORMAL HIGH (ref 70–99)
Potassium: 3.9 mEq/L (ref 3.7–5.3)
SODIUM: 137 meq/L (ref 137–147)

## 2013-12-23 LAB — GLUCOSE, CAPILLARY
GLUCOSE-CAPILLARY: 154 mg/dL — AB (ref 70–99)
GLUCOSE-CAPILLARY: 136 mg/dL — AB (ref 70–99)
GLUCOSE-CAPILLARY: 123 mg/dL — AB (ref 70–99)
GLUCOSE-CAPILLARY: 190 mg/dL — AB (ref 70–99)
Glucose-Capillary: 128 mg/dL — ABNORMAL HIGH (ref 70–99)

## 2013-12-23 LAB — PREPARE RBC (CROSSMATCH)

## 2013-12-23 NOTE — Progress Notes (Signed)
Offered pt assistance with cpap for rest.  Pt stated he can/will place it on himself later when ready for bed.  Cpap at 11cm h2o per home settings with his tubing and full face mask from home.  Sterile water added to max fill line per pt request.    Pt was advised that RT is available all night should he need further assistance.

## 2013-12-23 NOTE — Progress Notes (Signed)
Pt. Hgb is 6.3. MD notified and MD ordered 2 units of RBC. Patient informed. Will continue to monitor.

## 2013-12-23 NOTE — Anesthesia Preprocedure Evaluation (Addendum)
Anesthesia Evaluation  Patient identified by MRN, date of birth, ID band Patient awake    Reviewed: Allergy & Precautions, H&P , NPO status , Patient's Chart, lab work & pertinent test results  Airway Mallampati: III TM Distance: >3 FB Neck ROM: Full    Dental  (+) Teeth Intact and Dental Advisory Given   Pulmonary sleep apnea and Continuous Positive Airway Pressure Ventilation ,  breath sounds clear to auscultation  Pulmonary exam normal       Cardiovascular hypertension, Pt. on medications Rhythm:Regular Rate:Normal     Neuro/Psych negative neurological ROS  negative psych ROS   GI/Hepatic negative GI ROS, Neg liver ROS,   Endo/Other  diabetes, Type 2, Oral Hypoglycemic AgentsMorbid obesity  Renal/GU negative Renal ROS  negative genitourinary   Musculoskeletal negative musculoskeletal ROS (+)   Abdominal (+) + obese,   Peds  Hematology  (+) Blood dyscrasia (Acute Blood Loss Anemia; + Transfusions), anemia , NonHodgkins lymphoma   Anesthesia Other Findings   Reproductive/Obstetrics                        Anesthesia Physical Anesthesia Plan  ASA: IV  Anesthesia Plan: MAC   Post-op Pain Management:    Induction: Intravenous  Airway Management Planned: Simple Face Mask and Nasal Cannula  Additional Equipment:   Intra-op Plan:   Post-operative Plan:   Informed Consent: I have reviewed the patients History and Physical, chart, labs and discussed the procedure including the risks, benefits and alternatives for the proposed anesthesia with the patient or authorized representative who has indicated his/her understanding and acceptance.   Dental advisory given  Plan Discussed with: CRNA  Anesthesia Plan Comments: (Will proceed initially with MAC anesthesia with provision that patient may require GETA if uncooperative or uncomfortable intraop. Patient understands and agrees to proceed.   SaO2 100% on RA upon arrival in holding area. )      Anesthesia Quick Evaluation

## 2013-12-23 NOTE — Progress Notes (Addendum)
Patient resting comfortable with CPAP via home FFM, Home Settings of 11.0. Patient states he is able to place himself on/off as needed.

## 2013-12-23 NOTE — Progress Notes (Signed)
Unassigned patient Subjective: Since I last evaluated the patient, he has had a lot of rectal bleeding while prepping for the colonoscopy on Monday night and yesterday but the stools reportedly appeared brown this morning. He has had some mild abdominal cramping but denies any abdominal pain, nausea or vomiting at the present time. As IV access was a problem along with the need for MAC anesthesia and more blood transfusions his EGD/Colonoscopy have been postponed till tomorrow.   Objective: Vital signs in last 24 hours: Temp:  [98.2 F (36.8 C)-99.6 F (37.6 C)] 99.6 F (37.6 C) (02/04 0354) Pulse Rate:  [87-96] 96 (02/04 0354) Resp:  [14-18] 17 (02/04 0354) BP: (120-143)/(41-59) 143/48 mmHg (02/04 0354) SpO2:  [98 %-100 %] 98 % (02/04 0354) Weight:  [199.4 kg (439 lb 9.6 oz)] 199.4 kg (439 lb 9.6 oz) (02/04 0410) Last BM Date: 12/22/13  Intake/Output from previous day: 02/03 0701 - 02/04 0700 In: 2205.5 [P.O.:1340; I.V.:478; Blood:387.5] Out: 1200 [Urine:1200] Intake/Output this shift:   General appearance: alert, cooperative, appears older than stated age, no distress and morbidly obese Resp: clear to auscultation bilaterally Cardio: regular rate and rhythm, S1, S2 normal, no murmur, click, rub or gallop GI: soft, morbidly obese, non-tender; bowel sounds normal; no masses,  no organomegaly  Lab Results:  Recent Labs  12/22/13 0754 12/22/13 2115 12/23/13 0540  WBC 12.7* 11.7* 11.1*  HGB 6.6* 6.6* 6.3*  HCT 19.9* 19.6* 19.0*  PLT 183 183 166   BMET  Recent Labs  12/21/13 0341 12/22/13 0515 12/23/13 0540  NA 139 140 137  K 4.4 4.0 3.9  CL 103 102 102  CO2 27 26 26   GLUCOSE 134* 150* 116*  BUN 22 21 12   CREATININE 0.94 0.93 0.92  CALCIUM 8.2* 7.8* 8.0*   PT/INR  Recent Labs  12/20/13 1739  LABPROT 14.0  INR 1.10   Studies/Results: No results found.  Medications: I have reviewed the patient's current medications.  Assessment/Plan: 1) Rectal bleeding,  change in bowel habits with severe anemia: will proceed with a EGD/Colonoscopy tomorrow. He is to receive Miralax/Gatorade prep tonight.   2) Non-Hodgkin's lymphoma-started chemotherapy last month. 3) Morbid obesity.  LOS: 3 days   Endiya Klahr 12/23/2013, 7:36 AM

## 2013-12-23 NOTE — Progress Notes (Signed)
[facility] PROGRESS NOTE  Douglas [name] [id number] DOB: 06-04-1956 DOA: 12/20/2013 PCP: [name], MD  Brief narrative: Douglas [name] is a 57/E with PMH of follicular low-grade non-Hodgkin's lymphoma recently started on systemic chemotherapy first dose on 12/08/2013. Was admitted following 5 episodes of hematochezia. He was transfused 3u PRBCs but follow up hgb  2/4 still 6.5.He was seen by GI and received prep last night 2/2 for EGD/colonoscopy but procedures canceled on 2/3 and rescheduled for 2/5  Assessment/Plan:  1. Lower GI Bleed -suspect diverticular, AVMs, ? Lymphoma/infiltration of the colon could also be a possibility -CT from 12/14  With noted bulky lesion in the left groin, suspected  conglomerate lymph node mass in this patient with non-Hodgkin's  lymphoma. Moderate surrounding cellulitic change -plan for EGD and Colonoscopy in am 2/5 -multiple episodes of BRBPR on 2/3, denies any further hematochezia today  2. Acute blood loss anemia -due to 1 -transfused 3 units PRBC so far -2 more units this am -monitor CBC q8  3. HTN -BP stable, antihypertensives on hold  4. NHL recently started on chemo 1/20 -Dr.Mohamed notified via epic on admit  5. DM -holding oral hypoglycemics, SSI  6. L leg swelling  -due to L inguinal lymph node infiltration with lymphoma -DVT ruled out with dopplers last week, poor quality study  7. Obesity -lifestyle modification  DVT proph: SCDs  Full COde DIspo: keep on tele today  Consultants:  Gi dr.Hung  HPI/Subjective: Denies any further hematochezia overnight, denies abdominal pain  Objective: Filed Vitals:   12/23/13 1545  BP: 112/52  Pulse: 91  Temp: 98.3 F (36.8 C)  Resp: 20    Intake/Output Summary (Last 24 hours) at 12/23/13 1636 Last data filed at 12/23/13 1530  Gross per 24 hour  Intake   2858 ml  Output   1600 ml  Net   1258 ml   Filed Weights   12/22/13 0500 12/22/13 0701  12/23/13 0410  Weight: 218.588 kg (481 lb 14.4 oz) 212.692 kg (468 lb 14.4 oz) 199.4 kg (439 lb 9.6 oz)    Exam:   General: AAOx3, obese male, no distress  Cardiovascular: S1S2/RRR  Respiratory: CTAB  Abdomen: obese, Nt, ND, BS present  Musculoskeletal: L groin swelling-lymphadenopathy, swelling in L leg   Data Reviewed: Basic Metabolic Panel:  Recent Labs Lab 12/20/13 1739 12/21/13 0341 12/22/13 0515 12/23/13 0540  NA 138 139 140 137  K 4.0 4.4 4.0 3.9  CL 99 103 102 102  CO2 26 27 26 26   GLUCOSE 261* 134* 150* 116*  BUN 25* 22 21 12   CREATININE 0.94 0.94 0.93 0.92  CALCIUM 8.5 8.2* 7.8* 8.0*   Liver Function Tests: No results found for this basename: AST, ALT, ALKPHOS, BILITOT, PROT, ALBUMIN,  in the last 168 hours No results found for this basename: LIPASE, AMYLASE,  in the last 168 hours No results found for this basename: AMMONIA,  in the last 168 hours CBC:  Recent Labs Lab 12/20/13 1739  12/21/13 2301 12/22/13 0754 12/22/13 2115 12/23/13 0540 12/23/13 1330  WBC 10.5  < > 12.1* 12.7* 11.7* 11.1* 11.2*  NEUTROABS 9.0*  --   --   --   --   --   --   HGB 7.4*  < > 7.3* 6.6* 6.6* 6.3* 6.5*  HCT 22.8*  < > 22.3* 19.9* 19.6* 19.0* 19.9*  MCV 86.0  < > 86.4 85.8 86.3 86.8 87.3  PLT 288  < > 201 183 183 166  179  < > = values in this interval not displayed. Cardiac Enzymes: No results found for this basename: CKTOTAL, CKMB, CKMBINDEX, TROPONINI,  in the last 168 hours BNP (last 3 results) No results found for this basename: PROBNP,  in the last 8760 hours CBG:  Recent Labs Lab 12/22/13 1940 12/22/13 2335 12/23/13 0352 12/23/13 0804 12/23/13 1149  GLUCAP 154* 141* 136* 123* 190*    Recent Results (from the past 240 hour(s))  MRSA PCR SCREENING     Status: None   Collection Time    12/20/13  9:29 PM      Result Value Range Status   MRSA by PCR NEGATIVE  NEGATIVE Final   Comment:            The GeneXpert MRSA Assay (FDA     approved for NASAL  specimens     only), is one component of a     comprehensive MRSA colonization     surveillance program. It is not     intended to diagnose MRSA     infection nor to guide or     monitor treatment for     MRSA infections.     Studies: No results found.  Scheduled Meds: . atorvastatin  20 mg Oral Daily  . feeding supplement (RESOURCE BREEZE)  1 Container Oral BID BM  . insulin aspart  0-15 Units Subcutaneous Q4H  . pantoprazole (PROTONIX) IV  40 mg Intravenous Q24H  . polyethylene glycol powder  1 Container Oral Once   Continuous Infusions: . sodium chloride 50 mL/hr at 12/21/13 1126  . sodium chloride 20 mL/hr at 12/22/13 2041    Active Problems:   NHL (non-Hodgkin's lymphoma)   HTN (hypertension)   Morbid obesity   Lower GI bleed   Acute blood loss anemia   Diabetes mellitus    Time spent: 61min    [facility] Pager [phone]. If 7PM-7AM, please contact night-coverage at [email or web address], password [facility] 12/23/2013, 4:36 PM  LOS: 3 days

## 2013-12-24 ENCOUNTER — Encounter (HOSPITAL_COMMUNITY)
Admission: EM | Disposition: A | Discharge status: Home Health | Payer: Self-pay | Source: Home / Self Care | Attending: Internal Medicine

## 2013-12-24 ENCOUNTER — Encounter (HOSPITAL_COMMUNITY): Payer: Self-pay

## 2013-12-24 ENCOUNTER — Inpatient Hospital Stay (HOSPITAL_COMMUNITY): Payer: BC Managed Care – PPO | Admitting: Anesthesiology

## 2013-12-24 ENCOUNTER — Encounter (HOSPITAL_COMMUNITY): Payer: BC Managed Care – PPO | Admitting: Anesthesiology

## 2013-12-24 HISTORY — PX: ERCP: SHX5425

## 2013-12-24 HISTORY — PX: COLONOSCOPY: SHX5424

## 2013-12-24 LAB — TYPE AND SCREEN
ABO/RH(D): A POS
ANTIBODY SCREEN: NEGATIVE
UNIT DIVISION: 0
Unit division: 0
Unit division: 0
Unit division: 0
Unit division: 0

## 2013-12-24 LAB — CBC
HCT: 23.2 % — ABNORMAL LOW (ref 39.0–52.0)
HCT: 22.3 % — ABNORMAL LOW (ref 39.0–52.0)
HEMATOCRIT: 25.8 % — AB (ref 39.0–52.0)
HEMOGLOBIN: 8.1 g/dL — AB (ref 13.0–17.0)
HEMOGLOBIN: 8.7 g/dL — AB (ref 13.0–17.0)
HEMOGLOBIN: 7.3 g/dL — AB (ref 13.0–17.0)
MCH: 29.8 pg (ref 26.0–34.0)
MCH: 28.7 pg (ref 26.0–34.0)
MCH: 28.2 pg (ref 26.0–34.0)
MCHC: 34.9 g/dL (ref 30.0–36.0)
MCHC: 33.7 g/dL (ref 30.0–36.0)
MCHC: 32.7 g/dL (ref 30.0–36.0)
MCV: 85.3 fL (ref 78.0–100.0)
MCV: 85.1 fL (ref 78.0–100.0)
MCV: 86.1 fL (ref 78.0–100.0)
Platelets: 167 10*3/uL (ref 150–400)
Platelets: 199 10*3/uL (ref 150–400)
Platelets: 173 10*3/uL (ref 150–400)
RBC: 2.72 MIL/uL — AB (ref 4.22–5.81)
RBC: 3.03 MIL/uL — AB (ref 4.22–5.81)
RBC: 2.59 MIL/uL — AB (ref 4.22–5.81)
RDW: 16.4 % — ABNORMAL HIGH (ref 11.5–15.5)
RDW: 16.7 % — ABNORMAL HIGH (ref 11.5–15.5)
RDW: 16.7 % — ABNORMAL HIGH (ref 11.5–15.5)
WBC: 10 10*3/uL (ref 4.0–10.5)
WBC: 9.1 10*3/uL (ref 4.0–10.5)
WBC: 7.6 10*3/uL (ref 4.0–10.5)

## 2013-12-24 LAB — GLUCOSE, CAPILLARY
GLUCOSE-CAPILLARY: 108 mg/dL — AB (ref 70–99)
GLUCOSE-CAPILLARY: 112 mg/dL — AB (ref 70–99)
GLUCOSE-CAPILLARY: 114 mg/dL — AB (ref 70–99)
GLUCOSE-CAPILLARY: 120 mg/dL — AB (ref 70–99)
GLUCOSE-CAPILLARY: 124 mg/dL — AB (ref 70–99)
GLUCOSE-CAPILLARY: 145 mg/dL — AB (ref 70–99)
Glucose-Capillary: 147 mg/dL — ABNORMAL HIGH (ref 70–99)
Glucose-Capillary: 119 mg/dL — ABNORMAL HIGH (ref 70–99)

## 2013-12-24 SURGERY — EGD (ESOPHAGOGASTRODUODENOSCOPY)
Anesthesia: Monitor Anesthesia Care | Laterality: Left

## 2013-12-24 SURGERY — ERCP, WITH INTERVENTION IF INDICATED
Anesthesia: Monitor Anesthesia Care

## 2013-12-24 MED ORDER — MIDAZOLAM HCL 2 MG/2ML IJ SOLN
INTRAMUSCULAR | Status: AC
  Filled 2013-12-24: qty 2

## 2013-12-24 MED ORDER — GLYCOPYRROLATE 0.2 MG/ML IJ SOLN
INTRAMUSCULAR | Status: AC
  Filled 2013-12-24: qty 2

## 2013-12-24 MED ORDER — FENTANYL CITRATE 0.05 MG/ML IJ SOLN
INTRAMUSCULAR | Status: DC | PRN
  Administered 2013-12-24 (×2): 25 ug via INTRAVENOUS
  Administered 2013-12-24: 50 ug via INTRAVENOUS

## 2013-12-24 MED ORDER — LACTATED RINGERS IV SOLN: INTRAVENOUS | Status: DC

## 2013-12-24 MED ORDER — PHENYLEPHRINE HCL 10 MG/ML IJ SOLN
INTRAMUSCULAR | Status: AC
  Filled 2013-12-24: qty 1

## 2013-12-24 MED ORDER — PROPOFOL 10 MG/ML IV BOLUS
INTRAVENOUS | Status: AC
  Filled 2013-12-24: qty 20

## 2013-12-24 MED ORDER — LIDOCAINE HCL (CARDIAC) 20 MG/ML IV SOLN
INTRAVENOUS | Status: AC
  Filled 2013-12-24: qty 5

## 2013-12-24 MED ORDER — ONDANSETRON HCL 4 MG/2ML IJ SOLN
INTRAMUSCULAR | Status: DC | PRN
  Administered 2013-12-24 (×2): 2 mg via INTRAVENOUS

## 2013-12-24 MED ORDER — KETAMINE HCL 10 MG/ML IJ SOLN
INTRAMUSCULAR | Status: DC | PRN
  Administered 2013-12-24: 25 mg via INTRAVENOUS

## 2013-12-24 MED ORDER — EPHEDRINE SULFATE 50 MG/ML IJ SOLN
INTRAMUSCULAR | Status: AC
  Filled 2013-12-24: qty 1

## 2013-12-24 MED ORDER — PROPOFOL INFUSION 10 MG/ML OPTIME
INTRAVENOUS | Status: DC | PRN
  Administered 2013-12-24: 75 ug/kg/min via INTRAVENOUS

## 2013-12-24 MED ORDER — MIDAZOLAM HCL 5 MG/5ML IJ SOLN
INTRAMUSCULAR | Status: DC | PRN
  Administered 2013-12-24 (×2): 1 mg via INTRAVENOUS

## 2013-12-24 MED ORDER — BUTAMBEN-TETRACAINE-BENZOCAINE 2-2-14 % EX AERO
INHALATION_SPRAY | CUTANEOUS | Status: DC | PRN
  Administered 2013-12-24: 2 via TOPICAL

## 2013-12-24 MED ORDER — KETAMINE HCL 10 MG/ML IJ SOLN
INTRAMUSCULAR | Status: AC
  Filled 2013-12-24: qty 1

## 2013-12-24 MED ORDER — PROMETHAZINE HCL 25 MG/ML IJ SOLN: 6.2500 mg | INTRAMUSCULAR | Status: DC | PRN

## 2013-12-24 MED ORDER — FENTANYL CITRATE 0.05 MG/ML IJ SOLN
INTRAMUSCULAR | Status: AC
  Filled 2013-12-24: qty 2

## 2013-12-24 MED ORDER — ONDANSETRON HCL 4 MG/2ML IJ SOLN
INTRAMUSCULAR | Status: AC
  Filled 2013-12-24: qty 2

## 2013-12-24 MED ORDER — SODIUM CHLORIDE 0.9 % IV SOLN: INTRAVENOUS | Status: DC

## 2013-12-24 NOTE — Op Note (Signed)
[facility] ,   OPERATIVE PROCEDURE REPORT  PATIENT: Douglas, [name]  MR#: [id number] BIRTHDATE:     GENDER: Male ENDOSCOPIST: Carol Ada, MD ASSISTANT:   Mariana Single, RN, Cristopher Estimable, technician, and Tedra Coupe, PROCEDURE DATE: 12/24/2013 PROCEDURE:   EGD ASA CLASS:   Class III INDICATIONS: GI bleed MEDICATIONS: MAC TOPICAL ANESTHETIC:   Cetacaine spray  DESCRIPTION OF PROCEDURE:   After the risks benefits and alternatives of the procedure were thoroughly explained, informed consent was obtained.  The Pentax Gastroscope [id number]  endoscope was introduced through the mouth  and advanced to the second portion of the duodenum Without limitations.      The instrument was slowly withdrawn as the mucosa was fully examined.      FINDINGS: The Z-line was sharp.  A small sliding hiatal hernia was identified.  The gastric lumen was normal.  In the duodenal bulb a clean-based nonbleeding ulcer was identified.  No evidence of any old or fresh blood.   Retroflexed views revealed no abnormalities. The scope was then withdrawn from the patient and the procedure terminated.  COMPLICATIONS: There were no complications. IMPRESSION: 1) Nonbleeding duodenal bulb ulcer  RECOMMENDATIONS: 1) PPI BID x 1 month an then indefinitely. 2) Check H. pylori serology.  _______________________________ Lorrin MaisCarol Ada, MD 12/24/2013 3:01 PM

## 2013-12-24 NOTE — Progress Notes (Signed)
Placed patient on CPAP at 11cm for the night

## 2013-12-24 NOTE — Anesthesia Postprocedure Evaluation (Signed)
Anesthesia Post Note  Patient: Douglas [name]  Procedure(s) Performed: Procedure(s) (LRB): UPPER ENDOSCOPY (N/A) COLONOSCOPY (N/A)  Anesthesia type: MAC  Patient location: PACU  Post pain: Pain level controlled  Post assessment: Post-op Vital signs reviewed  Last Vitals:  Filed Vitals:   12/24/13 1555  BP: 146/67  Pulse: 87  Temp: 36.9 C  Resp: 15    Post vital signs: Reviewed  Level of consciousness: sedated  Complications: No apparent anesthesia complications

## 2013-12-24 NOTE — Interval H&P Note (Signed)
History and Physical Interval Note:  12/24/2013 1:44 PM  Douglas [name]  has presented today for surgery, with the diagnosis of rectal bleeding  The various methods of treatment have been discussed with the patient and family. After consideration of risks, benefits and other options for treatment, the patient has consented to  Procedure(s): UPPER ENDOSCOPY (N/A) COLONOSCOPY (N/A) as a surgical intervention .  The patient's history has been reviewed, patient examined, no change in status, stable for surgery.  I have reviewed the patient's chart and labs.  Questions were answered to the patient's satisfaction.     [name]

## 2013-12-24 NOTE — H&P (View-Only) (Signed)
Unassigned patient Subjective: Since I last evaluated the patient, he has had a lot of rectal bleeding while prepping for the colonoscopy on Monday night and yesterday but the stools reportedly appeared brown this morning. He has had some mild abdominal cramping but denies any abdominal pain, nausea or vomiting at the present time. As IV access was a problem along with the need for MAC anesthesia and more blood transfusions his EGD/Colonoscopy have been postponed till tomorrow.   Objective: Vital signs in last 24 hours: Temp:  [98.2 F (36.8 C)-99.6 F (37.6 C)] 99.6 F (37.6 C) (02/04 0354) Pulse Rate:  [87-96] 96 (02/04 0354) Resp:  [14-18] 17 (02/04 0354) BP: (120-143)/(41-59) 143/48 mmHg (02/04 0354) SpO2:  [98 %-100 %] 98 % (02/04 0354) Weight:  [199.4 kg (439 lb 9.6 oz)] 199.4 kg (439 lb 9.6 oz) (02/04 0410) Last BM Date: 12/22/13  Intake/Output from previous day: 02/03 0701 - 02/04 0700 In: 2205.5 [P.O.:1340; I.V.:478; Blood:387.5] Out: 1200 [Urine:1200] Intake/Output this shift:   General appearance: alert, cooperative, appears older than stated age, no distress and morbidly obese Resp: clear to auscultation bilaterally Cardio: regular rate and rhythm, S1, S2 normal, no murmur, click, rub or gallop GI: soft, morbidly obese, non-tender; bowel sounds normal; no masses,  no organomegaly  Lab Results:  Recent Labs  12/22/13 0754 12/22/13 2115 12/23/13 0540  WBC 12.7* 11.7* 11.1*  HGB 6.6* 6.6* 6.3*  HCT 19.9* 19.6* 19.0*  PLT 183 183 166   BMET  Recent Labs  12/21/13 0341 12/22/13 0515 12/23/13 0540  NA 139 140 137  K 4.4 4.0 3.9  CL 103 102 102  CO2 27 26 26  GLUCOSE 134* 150* 116*  BUN 22 21 12  CREATININE 0.94 0.93 0.92  CALCIUM 8.2* 7.8* 8.0*   PT/INR  Recent Labs  12/20/13 1739  LABPROT 14.0  INR 1.10   Studies/Results: No results found.  Medications: I have reviewed the patient's current medications.  Assessment/Plan: 1) Rectal bleeding,  change in bowel habits with severe anemia: will proceed with a EGD/Colonoscopy tomorrow. He is to receive Miralax/Gatorade prep tonight.   2) Non-Hodgkin's lymphoma-started chemotherapy last month. 3) Morbid obesity.  LOS: 3 days   Lynx Goodrich 12/23/2013, 7:36 AM   

## 2013-12-24 NOTE — Progress Notes (Signed)
[facility] PROGRESS NOTE  KALUP JAQUITH GQQ:[id number] DOB: 23-Jun-1956 DOA: 12/20/2013 PCP: [name], MD  Brief narrative: [name] is a 68/T with PMH of follicular low-grade non-Hodgkin's lymphoma recently started on systemic chemotherapy first dose on 12/08/2013. Was admitted following 5 episodes of hematochezia. He was transfused 3u PRBCs but follow up hgb  2/4 still 6.5.He was seen by GI and received prep last night 2/2 for EGD/colonoscopy but procedures canceled on 2/3 and rescheduled for 2/5  Assessment/Plan:  1. Lower GI Bleed -suspect diverticular, AVMs, ? Lymphoma/infiltration of the colon could also be a possibility -CT from 12/14  With noted bulky lesion in the left groin, suspected  conglomerate lymph node mass in this patient with non-Hodgkin's  lymphoma. Moderate surrounding cellulitic change -multiple episodes of BRBPR on 2/3, denies any further hematochezia today - EGD and Colonoscopy today to/5-EGD with duodenal ulcer and colonoscopy with polyp lipoma and hemorrhoids. -Appreciate GI assistance. 2. Acute blood loss anemia -due to 1 -transfused 5 units PRBC so far -monitor CBC q8 -Mullen stable at 8.7 today. 3. HTN -BP stable, antihypertensives on hold  4. NHL recently started on chemo 1/20 -Dr.Mohamed notified via epic on admit  5. DM -holding oral hypoglycemics, SSI  6. L leg swelling  -due to L inguinal lymph node infiltration with lymphoma -DVT ruled out with dopplers last week, poor quality study  7. Obesity -lifestyle modification  DVT proph: SCDs  Full COde DIspo: keep on tele today  Consultants:  Gi dr.Hung  HPI/Subjective: s/p EGD and colonoscopy, denies any further hematochezia. Tolerating by mouth's.  Objective: Filed Vitals:   12/24/13 1555  BP: 146/67  Pulse: 87  Temp: 98.4 F (36.9 C)  Resp: 15    Intake/Output Summary (Last 24 hours) at 12/24/13 1842 Last data filed at 12/24/13 1537  Gross per  24 hour  Intake 1632.5 ml  Output    275 ml  Net 1357.5 ml   Filed Weights   12/22/13 0701 12/23/13 0410 12/24/13 0424  Weight: 212.692 kg (468 lb 14.4 oz) 199.4 kg (439 lb 9.6 oz) 213.7 kg (471 lb 2 oz)    Exam:   General: AAOx3, obese male, no distress  Cardiovascular: S1S2/RRR  Respiratory: CTAB  Abdomen: obese, Nt, ND, BS present  Musculoskeletal: L groin swelling-lymphadenopathy, swelling in L leg   Data Reviewed: Basic Metabolic Panel:  Recent Labs Lab 12/20/13 1739 12/21/13 0341 12/22/13 0515 12/23/13 0540  NA 138 139 140 137  K 4.0 4.4 4.0 3.9  CL 99 103 102 102  CO2 26 27 26 26   GLUCOSE 261* 134* 150* 116*  BUN 25* 22 21 12   CREATININE 0.94 0.94 0.93 0.92  CALCIUM 8.5 8.2* 7.8* 8.0*   Liver Function Tests: No results found for this basename: AST, ALT, ALKPHOS, BILITOT, PROT, ALBUMIN,  in the last 168 hours No results found for this basename: LIPASE, AMYLASE,  in the last 168 hours No results found for this basename: AMMONIA,  in the last 168 hours CBC:  Recent Labs Lab 12/20/13 1739  12/22/13 2115 12/23/13 0540 12/23/13 1330 12/24/13 0200 12/24/13 1025  WBC 10.5  < > 11.7* 11.1* 11.2* 10.0 9.1  NEUTROABS 9.0*  --   --   --   --   --   --   HGB 7.4*  < > 6.6* 6.3* 6.5* 8.1* 8.7*  HCT 22.8*  < > 19.6* 19.0* 19.9* 23.2* 25.8*  MCV 86.0  < > 86.3 86.8 87.3 85.3 85.1  PLT 288  < > 183 166 179 167 199  < > = values in this interval not displayed. Cardiac Enzymes: No results found for this basename: CKTOTAL, CKMB, CKMBINDEX, TROPONINI,  in the last 168 hours BNP (last 3 results) No results found for this basename: PROBNP,  in the last 8760 hours CBG:  Recent Labs Lab 12/24/13 0753 12/24/13 1146 12/24/13 1320 12/24/13 1521 12/24/13 1706  GLUCAP 108* 114* 112* 119* 124*    Recent Results (from the past 240 hour(s))  MRSA PCR SCREENING     Status: None   Collection Time    12/20/13  9:29 PM      Result Value Range Status   MRSA by PCR  NEGATIVE  NEGATIVE Final   Comment:            The GeneXpert MRSA Assay (FDA     approved for NASAL specimens     only), is one component of a     comprehensive MRSA colonization     surveillance program. It is not     intended to diagnose MRSA     infection nor to guide or     monitor treatment for     MRSA infections.     Studies: No results found.  Scheduled Meds: . atorvastatin  20 mg Oral Daily  . feeding supplement (RESOURCE BREEZE)  1 Container Oral BID BM  . insulin aspart  0-15 Units Subcutaneous Q4H  . pantoprazole (PROTONIX) IV  40 mg Intravenous Q24H   Continuous Infusions: . sodium chloride 50 mL/hr at 12/21/13 1126  . sodium chloride 20 mL/hr at 12/22/13 2041  . sodium chloride    . lactated ringers      Active Problems:   NHL (non-Hodgkin's lymphoma)   HTN (hypertension)   Morbid obesity   Lower GI bleed   Acute blood loss anemia   Diabetes mellitus    Time spent: 7min    [facility] Pager [phone]. If 7PM-7AM, please contact night-coverage at [email or web address], password [facility] 12/24/2013, 6:42 PM  LOS: 4 days

## 2013-12-24 NOTE — Op Note (Signed)
[facility] ,   OPERATIVE PROCEDURE REPORT  PATIENT: Douglas [name], Douglas [name]  MR#: [id number] BIRTHDATE:     GENDER: Male ENDOSCOPIST: Carol Ada, MD ASSISTANT:   Mariana Single, RN Cristopher Estimable, technician Tedra Coupe PROCEDURE DATE: 12/24/2013 PROCEDURE:   Colonoscopy with snare polypectomy ASA CLASS:   Class III INDICATIONS:Rectal Bleeding. MEDICATIONS: MAC sedation, administered by CRNA  DESCRIPTION OF PROCEDURE:   After the risks benefits and alternatives of the procedure were thoroughly explained, informed consent was obtained.  A digital rectal exam revealed no abnormalities of the rectum.    The Pentax Colonoscope [id number] endoscope was introduced through the anus  and advanced to the cecum, which was identified by both the appendix and ileocecal valve , No adverse events experienced.    The quality of the prep was good. .  The instrument was then slowly withdrawn as the colon was fully examined.     FINDINGS: A 3 mm sessile sigmoid colon polyp was removed with a cold snare.  A left sided lipoma was also identified.  No evidence of any ulcerations, erosions, masses, or vascular abnormalities. Retroflexed views revealed internal/external hemorrhoids.     The scope was then withdrawn from the patient and the procedure terminated.  COMPLICATIONS: There were no complications.  IMPRESSION: 1) Polyp. 2) Lipoma. 3) Int/Ext Hemorrhoids.  RECOMMENDATIONS: 1) Await biopsy results. 2) Repeat the colonoscopy in 5-10 years.  _______________________________ Lorrin MaisCarol Ada, MD 12/24/2013 3:05 PM

## 2013-12-24 NOTE — Transfer of Care (Signed)
Immediate Anesthesia Transfer of Care Note  Patient: Douglas [name]  Procedure(s) Performed: Procedure(s): UPPER ENDOSCOPY (N/A) COLONOSCOPY (N/A)  Patient Location: PACU  Anesthesia Type:MAC  Level of Consciousness: awake, alert , oriented and patient cooperative  Airway & Oxygen Therapy: Patient Spontanous Breathing and Patient connected to face mask oxygen  Post-op Assessment: Report given to PACU RN and Post -op Vital signs reviewed and stable  Post vital signs: stable  Complications: No apparent anesthesia complications

## 2013-12-25 ENCOUNTER — Encounter (HOSPITAL_COMMUNITY): Payer: Self-pay | Admitting: Gastroenterology

## 2013-12-25 LAB — GLUCOSE, CAPILLARY
GLUCOSE-CAPILLARY: 118 mg/dL — AB (ref 70–99)
GLUCOSE-CAPILLARY: 129 mg/dL — AB (ref 70–99)
GLUCOSE-CAPILLARY: 180 mg/dL — AB (ref 70–99)
Glucose-Capillary: 125 mg/dL — ABNORMAL HIGH (ref 70–99)
Glucose-Capillary: 109 mg/dL — ABNORMAL HIGH (ref 70–99)
Glucose-Capillary: 151 mg/dL — ABNORMAL HIGH (ref 70–99)
Glucose-Capillary: 142 mg/dL — ABNORMAL HIGH (ref 70–99)

## 2013-12-25 LAB — CBC
HCT: 22.6 % — ABNORMAL LOW (ref 39.0–52.0)
HCT: 23.6 % — ABNORMAL LOW (ref 39.0–52.0)
HEMATOCRIT: 23.6 % — AB (ref 39.0–52.0)
Hemoglobin: 7.5 g/dL — ABNORMAL LOW (ref 13.0–17.0)
Hemoglobin: 7.8 g/dL — ABNORMAL LOW (ref 13.0–17.0)
Hemoglobin: 7.6 g/dL — ABNORMAL LOW (ref 13.0–17.0)
MCH: 28.6 pg (ref 26.0–34.0)
MCH: 28.5 pg (ref 26.0–34.0)
MCH: 27.8 pg (ref 26.0–34.0)
MCHC: 33.2 g/dL (ref 30.0–36.0)
MCHC: 33.1 g/dL (ref 30.0–36.0)
MCHC: 32.2 g/dL (ref 30.0–36.0)
MCV: 86.3 fL (ref 78.0–100.0)
MCV: 86.1 fL (ref 78.0–100.0)
MCV: 86.4 fL (ref 78.0–100.0)
PLATELETS: 192 10*3/uL (ref 150–400)
PLATELETS: 199 10*3/uL (ref 150–400)
Platelets: 195 10*3/uL (ref 150–400)
RBC: 2.62 MIL/uL — AB (ref 4.22–5.81)
RBC: 2.74 MIL/uL — ABNORMAL LOW (ref 4.22–5.81)
RBC: 2.73 MIL/uL — AB (ref 4.22–5.81)
RDW: 16.9 % — AB (ref 11.5–15.5)
RDW: 16.5 % — AB (ref 11.5–15.5)
RDW: 16.4 % — AB (ref 11.5–15.5)
WBC: 7.5 10*3/uL (ref 4.0–10.5)
WBC: 6.6 10*3/uL (ref 4.0–10.5)
WBC: 6.7 10*3/uL (ref 4.0–10.5)

## 2013-12-25 LAB — H. PYLORI ANTIBODY, IGG: H PYLORI IGG: 0.52 {ISR}

## 2013-12-25 MED ORDER — INSULIN ASPART 100 UNIT/ML ~~LOC~~ SOLN
0.0000 [IU] | Freq: Three times a day (TID) | SUBCUTANEOUS | Status: DC
  Administered 2013-12-25: 2 [IU] via SUBCUTANEOUS
  Administered 2013-12-26: 3 [IU] via SUBCUTANEOUS

## 2013-12-25 MED ORDER — PANTOPRAZOLE SODIUM 40 MG PO TBEC
40.0000 mg | DELAYED_RELEASE_TABLET | Freq: Two times a day (BID) | ORAL | Status: DC
  Administered 2013-12-25 – 2013-12-26 (×2): 40 mg via ORAL
  Filled 2013-12-25 (×4): qty 1

## 2013-12-25 NOTE — Progress Notes (Signed)
[facility] PROGRESS NOTE  [name] QPY:[id number] DOB: 04-15-1956 DOA: 12/20/2013 PCP: [name], MD  Brief narrative: Douglas [name] is a 13/W with PMH of follicular low-grade non-Hodgkin's lymphoma recently started on systemic chemotherapy first dose on 12/08/2013. Was admitted following 5 episodes of hematochezia. He was transfused 3u PRBCs but follow up hgb  2/4 still 6.5.He was seen by GI and received prep last night 2/2 for EGD/colonoscopy but procedures canceled on 2/3 and rescheduled for 2/5  Assessment/Plan:  1. Lower GI Bleed -suspect diverticular, AVMs, ? Lymphoma/infiltration of the colon could also be a possibility -CT from 12/14  With noted bulky lesion in the left groin, suspected  conglomerate lymph node mass in this patient with non-Hodgkin's  lymphoma. Moderate surrounding cellulitic change -multiple episodes of BRBPR on 2/3, denies any further hematochezia today - EGD and Colonoscopy 2/5-EGD with duodenal ulcer and colonoscopy with polyp lipoma and hemorrhoids. -Appreciate GI assistance, discussed with Dr. Benson Norway and he recommends possible DC in a.m. if hemoglobin remained stable. 2. Acute blood loss anemia -due to 1 -transfused 5 units PRBC so far -monitor CBC q8 -Hemoglobin 7.5 today from 8.7, follow recheck 3. HTN -BP stable, antihypertensives on hold  4. NHL recently started on chemo 1/20 -Dr.Mohamed notified via epic on admit  5. DM -holding oral hypoglycemics, SSI  6. L leg swelling  -due to L inguinal lymph node infiltration with lymphoma -DVT ruled out with dopplers last week, poor quality study  7. Obesity -lifestyle modification  DVT proph: SCDs  Full COde DIspo: keep on tele today  Consultants:  Gi dr.Hung  HPI/Subjective: Denies hematochezia, tolerating by mouth's well.  Objective: Filed Vitals:   12/25/13 1348  BP: 132/64  Pulse: 83  Temp: 98.7 F (37.1 C)  Resp: 18    Intake/Output Summary (Last  24 hours) at 12/25/13 1842 Last data filed at 12/25/13 1831  Gross per 24 hour  Intake 6193.5 ml  Output   1200 ml  Net 4993.5 ml   Filed Weights   12/23/13 0410 12/24/13 0424 12/25/13 0422  Weight: 199.4 kg (439 lb 9.6 oz) 213.7 kg (471 lb 2 oz) 213.1 kg (469 lb 12.8 oz)    Exam:   General: AAOx3, obese male, no distress  Cardiovascular: S1S2/RRR  Respiratory: CTAB  Abdomen: obese, Nt, ND, BS present  Musculoskeletal: L groin swelling-lymphadenopathy, swelling in L leg   Data Reviewed: Basic Metabolic Panel:  Recent Labs Lab 12/20/13 1739 12/21/13 0341 12/22/13 0515 12/23/13 0540  NA 138 139 140 137  K 4.0 4.4 4.0 3.9  CL 99 103 102 102  CO2 26 27 26 26   GLUCOSE 261* 134* 150* 116*  BUN 25* 22 21 12   CREATININE 0.94 0.94 0.93 0.92  CALCIUM 8.5 8.2* 7.8* 8.0*   Liver Function Tests: No results found for this basename: AST, ALT, ALKPHOS, BILITOT, PROT, ALBUMIN,  in the last 168 hours No results found for this basename: LIPASE, AMYLASE,  in the last 168 hours No results found for this basename: AMMONIA,  in the last 168 hours CBC:  Recent Labs Lab 12/20/13 1739  12/24/13 0200 12/24/13 1025 12/24/13 2135 12/25/13 0535 12/25/13 1445  WBC 10.5  < > 10.0 9.1 7.6 7.5 6.6  NEUTROABS 9.0*  --   --   --   --   --   --   HGB 7.4*  < > 8.1* 8.7* 7.3* 7.5* 7.8*  HCT 22.8*  < > 23.2* 25.8* 22.3* 22.6* 23.6*  MCV  86.0  < > 85.3 85.1 86.1 86.3 86.1  PLT 288  < > 167 199 173 192 195  < > = values in this interval not displayed. Cardiac Enzymes: No results found for this basename: CKTOTAL, CKMB, CKMBINDEX, TROPONINI,  in the last 168 hours BNP (last 3 results) No results found for this basename: PROBNP,  in the last 8760 hours CBG:  Recent Labs Lab 12/25/13 0037 12/25/13 0420 12/25/13 0744 12/25/13 1153 12/25/13 1628  GLUCAP 129* 125* 109* 151* 118*    Recent Results (from the past 240 hour(s))  MRSA PCR SCREENING     Status: None   Collection Time     12/20/13  9:29 PM      Result Value Range Status   MRSA by PCR NEGATIVE  NEGATIVE Final   Comment:            The GeneXpert MRSA Assay (FDA     approved for NASAL specimens     only), is one component of a     comprehensive MRSA colonization     surveillance program. It is not     intended to diagnose MRSA     infection nor to guide or     monitor treatment for     MRSA infections.     Studies: No results found.  Scheduled Meds: . atorvastatin  20 mg Oral Daily  . feeding supplement (RESOURCE BREEZE)  1 Container Oral BID BM  . insulin aspart  0-15 Units Subcutaneous TID AC & HS  . pantoprazole  40 mg Oral BID AC   Continuous Infusions: . sodium chloride 50 mL/hr at 12/24/13 2023  . sodium chloride Stopped (12/25/13 1410)  . sodium chloride Stopped (12/25/13 1409)  . lactated ringers      Active Problems:   NHL (non-Hodgkin's lymphoma)   HTN (hypertension)   Morbid obesity   Lower GI bleed   Acute blood loss anemia   Diabetes mellitus    Time spent: 59min    [facility] Pager [phone]. If 7PM-7AM, please contact night-coverage at [email or web address], password [facility] 12/25/2013, 6:42 PM  LOS: 5 days

## 2013-12-25 NOTE — Progress Notes (Signed)
Placed patient on CPAP for the night at 11cm. SP02 level at this time is 96%

## 2013-12-25 NOTE — Progress Notes (Signed)
Subjective: No acute events.  Feeling well.  No overt signs of bleeding.  Objective: Vital signs in last 24 hours: Temp:  [98.1 F (36.7 C)-99.2 F (37.3 C)] 98.1 F (36.7 C) (02/06 0422) Pulse Rate:  [75-87] 79 (02/06 0422) Resp:  [13-20] 20 (02/06 0422) BP: (106-150)/(43-67) 106/43 mmHg (02/06 0422) SpO2:  [98 %-100 %] 98 % (02/06 0422) Weight:  [469 lb 12.8 oz (213.1 kg)] 469 lb 12.8 oz (213.1 kg) (02/06 0422) Last BM Date: 12/24/13  Intake/Output from previous day: 02/05 0701 - 02/06 0700 In: 1247.7 [P.O.:240; I.V.:1007.7] Out: 325 [Urine:300] Intake/Output this shift: Total I/O In: 240 [P.O.:240] Out: -   General appearance: alert and no distress GI: soft, non-tender; bowel sounds normal; no masses,  no organomegaly  Lab Results:  Recent Labs  12/24/13 1025 12/24/13 2135 12/25/13 0535  WBC 9.1 7.6 7.5  HGB 8.7* 7.3* 7.5*  HCT 25.8* 22.3* 22.6*  PLT 199 173 192   BMET  Recent Labs  12/23/13 0540  NA 137  K 3.9  CL 102  CO2 26  GLUCOSE 116*  BUN 12  CREATININE 0.92  CALCIUM 8.0*   LFT No results found for this basename: PROT, ALBUMIN, AST, ALT, ALKPHOS, BILITOT, BILIDIR, IBILI,  in the last 72 hours PT/INR No results found for this basename: LABPROT, INR,  in the last 72 hours Hepatitis Panel No results found for this basename: HEPBSAG, HCVAB, HEPAIGM, HEPBIGM,  in the last 72 hours C-Diff No results found for this basename: CDIFFTOX,  in the last 72 hours Fecal Lactopherrin No results found for this basename: FECLLACTOFRN,  in the last 72 hours  Studies/Results: No results found.  Medications:  Scheduled: . atorvastatin  20 mg Oral Daily  . feeding supplement (RESOURCE BREEZE)  1 Container Oral BID BM  . insulin aspart  0-15 Units Subcutaneous Q4H  . pantoprazole (PROTONIX) IV  40 mg Intravenous Q24H   Continuous: . sodium chloride 50 mL/hr at 12/24/13 2023  . sodium chloride 20 mL/hr at 12/22/13 2041  . sodium chloride    . lactated  ringers      Assessment/Plan: 1) Duodenal ulcer. 2) Anemia secondary to the bleeding.   Clinically he is stable.  HGB has dropped down, but I think this is equilibration issues.    Plan: 1) Monitor HGB and transfuse as necessary. 2) Protonix 40 mg BID x 1 month and then QD indefinitely.   LOS: 5 days   Tiffiany Beadles D 12/25/2013, 11:00 AM

## 2013-12-26 DIAGNOSIS — K264 Chronic or unspecified duodenal ulcer with hemorrhage: Principal | ICD-10-CM

## 2013-12-26 LAB — GLUCOSE, CAPILLARY
GLUCOSE-CAPILLARY: 155 mg/dL — AB (ref 70–99)
Glucose-Capillary: 111 mg/dL — ABNORMAL HIGH (ref 70–99)

## 2013-12-26 LAB — CBC
HCT: 23.6 % — ABNORMAL LOW (ref 39.0–52.0)
Hemoglobin: 7.5 g/dL — ABNORMAL LOW (ref 13.0–17.0)
MCH: 27.8 pg (ref 26.0–34.0)
MCHC: 31.8 g/dL (ref 30.0–36.0)
MCV: 87.4 fL (ref 78.0–100.0)
Platelets: 196 10*3/uL (ref 150–400)
RBC: 2.7 MIL/uL — ABNORMAL LOW (ref 4.22–5.81)
RDW: 16.4 % — ABNORMAL HIGH (ref 11.5–15.5)
WBC: 6.2 10*3/uL (ref 4.0–10.5)

## 2013-12-26 MED ORDER — OMEPRAZOLE 40 MG PO CPDR: 40.0000 mg | DELAYED_RELEASE_CAPSULE | Freq: Two times a day (BID) | ORAL | Status: AC

## 2013-12-26 MED ORDER — IRON 325 (65 FE) MG PO TABS: 325.0000 mg | ORAL_TABLET | Freq: Two times a day (BID) | ORAL | Status: AC

## 2013-12-26 NOTE — Discharge Summary (Signed)
Physician Discharge Summary  Douglas [name] LGX:[id number] DOB: September 08, 1956 DOA: 12/20/2013  PCP: Douglas Hampshire, MD  Admit date: 12/20/2013 Discharge date: 12/26/2013  Time spent: >30 minutes  Recommendations for Outpatient Follow-up:  Follow-up Information   Follow up with Douglas Hampshire, MD. (in 1-2weeks, call for appt upon discharge)    Specialty:  Family Medicine   Contact information:   Doug[name] [address] [phone]       Follow up with Douglas Beams, MD. (in 1week, call for appt upon discharge)    Specialty:  Gastroenterology   Contact information:   [address] Douglas [name] [place] [address] [phone]        Discharge Diagnoses:  Active Problems:   NHL (non-Hodgkin's lymphoma)   HTN (hypertension)   Morbid obesity   Lower GI bleed   Acute blood loss anemia   Diabetes mellitus   Discharge Condition: improved/stable  Diet recommendation: modified carb  Filed Weights   12/24/13 0424 12/25/13 0422 12/26/13 0909  Weight: 213.7 kg (471 lb 2 oz) 213.1 kg (469 lb 12.8 oz) 215.1 kg (474 lb 3.4 oz)    History of present illness:  Douglas [name] is a 58 y.o. male with a past medical history of follicular low-grade non-Hodgkin's lymphoma that was diagnosed in January of 2009, with disease progression in 2014, was recently started on systemic chemotherapy with Bendamustine and Rituxan receiving his first dose on 12/08/2013. He had 5 episodes of bloody stools this afternoon, characterize as bright red blood, moderate amount, and associated with crampy generalized abdominal pain. He also reported associated shortness of breath and diaphoresis. He denies nausea, vomiting, hematemesis, melena, syncope, or presyncope, chest pain. He reports having An episode of bright red blood per thumb approximately 7 years ago, at which time he stated undergoing colonoscopy. he is unaware of having a history of diverticulosis, his last CT scan of  abdomen and pelvis was performed in 2009 with radiology report not mentioning diverticulosis. In the emergency department he was found to be anemic having a hemoglobin of 7.4 with hematocrit of 22.8. Prior to this he had a hemoglobin 9.9 on 12/15/2013. Patient was administered a bolus of normal saline in the emergency department. He is presently not on anticoagulant although does take 81 mg of aspirin daily. He was admitted for further evaluation and management.   Hospital Course:   GI Bleed  -As discussed above, upon admission the suspicion was that t was diverticular, AVMs, ? Lymphoma/infiltration of the colon could also be a possibility  -CT from 12/14 With noted bulky lesion in the left groin, suspected  conglomerate lymph node mass in this patient with non-Hodgkin's  lymphoma. Moderate surrounding cellulitic change  -His H&H was monitored and was transfused packed red blood cells -GI was consulted saw patient >>2/5-EGD with duodenal ulcer and colonoscopy with polyp lipoma and hemorrhoids.  -The impression was that was more likely secondary to a duodenal ulcer. He had been on an aspirin and this was held during this hospital on patient is to continue to hold off a week, follows up with Dr. Benson Norway and then to resume as directed. -She saw patient today and recommended he be discharged on iron supplements and is to continue a PPI-twice a day for one month and then daily indefinitely. 2. Acute blood loss anemia  -due to above -Patient had serial monitoring of his hemoglobins and required a total of 5 units of packed red blood cells transfused -His hemoglobin on followup today  remained stable at 7.5 he's had no gross bleeding. 3. HTN  -His blood pressures have remained stable his to resume his outpatient meds upon discharge. 4. NHL recently started on chemo 1/20  -Dr.Mohamed notified via epic on admit, that patient in the hospital. -He had a PICC line placed on admission and he requested on  discharge to keep the PICC line in since he was going to be getting more chemotherapy-I discussed this with Dr. Jana [name] on call for. Dr. Julien [name] and he states that it is reasonable to keep it in if more chemotherapy is planned and home health RN set up to assist patient with PICC line care. 5. DM  -His oral hypoglycemic agents were held in the hospital in anticipation of the procedures listed below, he was covered with sliding scale insulin. His to continue his outpatient medications on discharge. 6. L leg swelling  -due to L inguinal lymph node infiltration with lymphoma  -DVT ruled out with dopplers last week, poor quality study  7. Obesity  -lifestyle modification      Procedures: PICC line placement on 2/3    EGD on 2/5 IMPRESSION:  1) Nonbleeding duodenal bulb ulcer  RECOMMENDATIONS:  1) PPI BID x 1 month an then indefinitely.  2) Check H. pylori serology.  colonoscopy on 2/5 IMPRESSION:  1) Polyp.  2) Lipoma.  3) Int/Ext Hemorrhoids.  RECOMMENDATIONS:  1) Await biopsy results.  2) Repeat the colonoscopy in 5-10 years.   Consultations:  GI  Discharge Exam: Filed Vitals:   12/26/13 1330  BP: 134/61  Pulse: 79  Temp: 98 F (36.7 C)  Resp: 18   Exam:  General: AAOx3, obese male, no distress  Cardiovascular: S1S2/RRR  Respiratory: CTAB  Abdomen: obese, Nt, ND, BS present  Musculoskeletal: L groin swelling-lymphadenopathy, swelling in L leg    Discharge Instructions  Discharge Orders   Future Appointments Provider Department Dept Phone   12/29/2013 11:15 AM [facility] Only [facility] [phone]   01/05/2014 8:00 AM [facility] [phone]   01/05/2014 8:30 AM [facility] Procedure [facility] [phone]   01/06/2014 8:30 AM [facility] 2 [facility] [phone]   01/12/2014 11:00 AM [facility] Only  [facility] [phone]   01/19/2014 11:15 AM [facility] Only [facility] [phone]   Future Orders Complete By Expires   Diet Carb Modified  As directed    Increase activity slowly  As directed        Medication List    STOP taking these medications       aspirin EC 81 MG tablet      TAKE these medications       atorvastatin 20 MG tablet  Commonly known as:  LIPITOR  Take 20 mg by mouth daily.     docusate sodium 100 MG capsule  Commonly known as:  COLACE  Take 100 mg by mouth 2 (two) times daily.     Iron 325 (65 FE) MG Tabs  Take 325 mg by mouth 2 (two) times daily.     omeprazole 40 MG capsule  Commonly known as:  PRILOSEC  Take 1 capsule (40 mg total) by mouth 2 (two) times daily.     pioglitazone-metformin 15-500 MG per tablet  Commonly known as:  ACTOPLUS MET  Take 1 tablet by mouth 2 (two) times daily with a  meal.     polyethylene glycol packet  Commonly known as:  MIRALAX / GLYCOLAX  Take 17 g by mouth daily as needed for mild constipation.     prochlorperazine 10 MG tablet  Commonly known as:  COMPAZINE  Take 1 tablet (10 mg total) by mouth every 6 (six) hours as needed for nausea or vomiting.     valsartan-hydrochlorothiazide 320-25 MG per tablet  Commonly known as:  DIOVAN-HCT  Take 1 tablet by mouth daily.       No Known Allergies     Follow-up Information   Follow up with Douglas Hampshire, MD. (in 1-2weeks, call for appt upon discharge)    Specialty:  Family Medicine   Contact information:   [place] [address] [phone]       Follow up with Douglas Beams, MD. (in 1week, call for appt upon discharge)    Specialty:  Gastroenterology   Contact information:   [address] [place] [address] [phone]        The results of significant diagnostics from this hospitalization (including imaging, microbiology, ancillary and  laboratory) are listed below for reference.    Significant Diagnostic Studies: Ct Chest W Contrast  12/02/2013   CLINICAL DATA:  Lymphoma.  EXAM: CT CHEST WITH CONTRAST  TECHNIQUE: Multidetector CT imaging of the chest was performed during intravenous contrast administration.  CONTRAST:  37m OMNIPAQUE IOHEXOL 300 MG/ML  SOLN  COMPARISON:  02/06/2008.  FINDINGS: The chest wall is unremarkable. No supraclavicular or axillary lymphadenopathy. Small scattered lymph nodes are noted. The bony thorax is intact. No destructive bone lesions or spinal canal compromise. Advanced degenerative changes involving the thoracic spine with anterior and lateral bridging osteophytes.  The heart is normal in size. No pericardial effusion. No mediastinal or hilar mass or lymphadenopathy. The aorta is normal in caliber. No dissection. The esophagus is grossly normal.  Examination of the lung parenchyma demonstrates to pulmonary nodules. There is a 12.5 mm lesion in the right upper lobe adjacent to the pleura with a slight a low-fat interstitial change a rounded. The second nodule is in the right middle lobe on image number 23 and measures approximately 6 mm. A sub 3 mm subpleural pulmonary nodule is noted in the right upper lobe on image number 28. The left lung is clear. No pleural effusion. No acute pulmonary findings. No interstitial lung disease or bronchiectasis chief  The upper abdomen is grossly normal.  IMPRESSION: Three right-sided pulmonary nodules. The largest measures 12.5 mm in the right upper lobe and appears to have some inflammation around it. Short-term followup noncontrast chest CT is recommended in 3 months to re-evaluate.  No supraclavicular, axillary, mediastinal or hilar lymphadenopathy.   Electronically Signed   By: MKalman JewelsM.D.   On: 12/02/2013 15:00   UKoreaScrotum  11/27/2013   CLINICAL DATA:  58year old male with history of non-Hodgkin's lymphoma, large left groin mass status post core biopsy in  December. Pathology revealed abnormal lymphoproliferative process. Scrotal mass. Initial encounter.  EXAM: SCROTAL ULTRASOUND  DOPPLER ULTRASOUND OF THE TESTICLES  TECHNIQUE: Complete ultrasound examination of the testicles, epididymis, and other scrotal structures was performed. Color and spectral Doppler ultrasound were also utilized to evaluate blood flow to the testicles.  COMPARISON:  Pelvis CT 10/30/2013.  FINDINGS: Large body habitus. Severe scrotal wall thickening (up to 4.5 cm, see image 6). No sonographic evidence of subcutaneous gas.  On 10/30/2013 a confluent left inguinal soft tissue mass plus left  pelvic side wall lymphadenopathy was demonstrated. The mass was incompletely visible but involves muscle, subcutaneous tissue, and possibly the dermis.  On some images of the left scrotal wall today the technologist questioned peristalsis (see series 2 semi images), but this appears to represent real-time vascularity rather than peristalsis, and there was no inguinal hernia on 10/30/2013.  Right testicle  Measurements: 3.9 x 2.9 x 3.0 cm. No mass or microlithiasis visualized.  Left testicle  Measurements: 3.7 x 2.5 x 2.6 cm. No mass or microlithiasis visualized.  Right epididymis: Echogenic, but nonenlarged. No evidence of hypervascularity (image 33).  Left epididymis: Echogenic and mildly enlarged comparing to the right side, thickness up to 11 mm. Mild hypervascularity compared to the right side (image 52).  Hydrocele:  None visualized.  Varicocele:  None visualized.  Pulsed Doppler interrogation of both testes demonstrates low resistance arterial and venous waveforms bilaterally.  IMPRESSION: 1. Severe scrotal wall thickening up to 4.5 cm, with an infiltrative appearance similar to the left inguinal process demonstrated on 11/02/2013. Repeat pelvis CT with IV contrast to include the entire scrotum may be beneficial in providing overall characterization of this process.  2. No testicular mass.  No  hydrocele.  3. The left epididymis does appear mildly enlarged and hypervascular, but the etiology in significance is unclear.   Electronically Signed   By: Lars Pinks M.D.   On: 11/27/2013 12:08   [place] Art/ven Flow Abd Pelv Doppler  11/27/2013   CLINICAL DATA:  58 year old male with history of non-Hodgkin's lymphoma, large left groin mass status post core biopsy in December. Pathology revealed abnormal lymphoproliferative process. Scrotal mass. Initial encounter.  EXAM: SCROTAL ULTRASOUND  DOPPLER ULTRASOUND OF THE TESTICLES  TECHNIQUE: Complete ultrasound examination of the testicles, epididymis, and other scrotal structures was performed. Color and spectral Doppler ultrasound were also utilized to evaluate blood flow to the testicles.  COMPARISON:  Pelvis CT 10/30/2013.  FINDINGS: Large body habitus. Severe scrotal wall thickening (up to 4.5 cm, see image 6). No sonographic evidence of subcutaneous gas.  On 10/30/2013 a confluent left inguinal soft tissue mass plus left pelvic side wall lymphadenopathy was demonstrated. The mass was incompletely visible but involves muscle, subcutaneous tissue, and possibly the dermis.  On some images of the left scrotal wall today the technologist questioned peristalsis (see series 2 semi images), but this appears to represent real-time vascularity rather than peristalsis, and there was no inguinal hernia on 10/30/2013.  Right testicle  Measurements: 3.9 x 2.9 x 3.0 cm. No mass or microlithiasis visualized.  Left testicle  Measurements: 3.7 x 2.5 x 2.6 cm. No mass or microlithiasis visualized.  Right epididymis: Echogenic, but nonenlarged. No evidence of hypervascularity (image 33).  Left epididymis: Echogenic and mildly enlarged comparing to the right side, thickness up to 11 mm. Mild hypervascularity compared to the right side (image 52).  Hydrocele:  None visualized.  Varicocele:  None visualized.  Pulsed Doppler interrogation of both testes demonstrates low resistance  arterial and venous waveforms bilaterally.  IMPRESSION: 1. Severe scrotal wall thickening up to 4.5 cm, with an infiltrative appearance similar to the left inguinal process demonstrated on 11/02/2013. Repeat pelvis CT with IV contrast to include the entire scrotum may be beneficial in providing overall characterization of this process.  2. No testicular mass.  No hydrocele.  3. The left epididymis does appear mildly enlarged and hypervascular, but the etiology in significance is unclear.   Electronically Signed   By: Lars Pinks M.D.   On: 11/27/2013  12:08    Microbiology: Recent Results (from the past 240 hour(s))  MRSA PCR SCREENING     Status: None   Collection Time    12/20/13  9:29 PM      Result Value Range Status   MRSA by PCR NEGATIVE  NEGATIVE Final   Comment:            The GeneXpert MRSA Assay (FDA     approved for NASAL specimens     only), is one component of a     comprehensive MRSA colonization     surveillance program. It is not     intended to diagnose MRSA     infection nor to guide or     monitor treatment for     MRSA infections.     Labs: Basic Metabolic Panel:  Recent Labs Lab 12/20/13 1739 12/21/13 0341 12/22/13 0515 12/23/13 0540  NA 138 139 140 137  K 4.0 4.4 4.0 3.9  CL 99 103 102 102  CO2 _0 GLUCOSE 261* 134* 150* 116*  BUN 25* _1 CREATININE 0.94 0.94 0.93 0.92  CALCIUM 8.5 8.2* 7.8* 8.0*   Liver Function Tests: No results found for this basename: AST, ALT, ALKPHOS, BILITOT, PROT, ALBUMIN,  in the last 168 hours No results found for this basename: LIPASE, AMYLASE,  in the last 168 hours No results found for this basename: AMMONIA,  in the last 168 hours CBC:  Recent Labs Lab 12/20/13 1739  12/24/13 2135 12/25/13 0535 12/25/13 1445 12/25/13 2135 12/26/13 0435  WBC 10.5  < > 7.6 7.5 6.6 6.7 6.2  NEUTROABS 9.0*  --   --   --   --   --   --   HGB 7.4*  < > 7.3* 7.5* 7.8* 7.6* 7.5*  HCT 22.8*  < > 22.3* 22.6* 23.6* 23.6*  23.6*  MCV 86.0  < > 86.1 86.3 86.1 86.4 87.4  PLT 288  < > 173 192 195 199 196  < > = values in this interval not displayed. Cardiac Enzymes: No results found for this basename: CKTOTAL, CKMB, CKMBINDEX, TROPONINI,  in the last 168 hours BNP: BNP (last 3 results) No results found for this basename: PROBNP,  in the last 8760 hours CBG:  Recent Labs Lab 12/25/13 1153 12/25/13 1628 12/25/13 2126 12/26/13 0744 12/26/13 1217  GLUCAP 151* 118* 142* 111* 155*       Signed:  Ashiah Karpowicz C  [facility] 12/26/2013, 2:10 PM

## 2013-12-26 NOTE — Progress Notes (Signed)
   CARE MANAGEMENT NOTE 12/26/2013  Patient:  MAXIMO, SPRATLING   Account Number:  [id number]  Date Initiated:  12/21/2013  Documentation initiated by:  DAVIS,RHONDA  Subjective/Objective Assessment:   pt with hx of lymphoma, admitted with active gi bld and hgb down to 6.9/ no anticoag.'s hx but does take asa 81mg qd, bp different wide , required iv ns boulses in ED/     Action/Plan:   home when stable   Anticipated DC Date:  12/25/2013   Anticipated DC Plan:  HOME/SELF CARE  In-house referral  NA      DC Planning Services  CM consult      [facility] Choice  HOME HEALTH   Choice offered to / List presented to:  C-1 Patient   DME arranged  NA      DME agency  NA     [place] arranged  HH-1 RN      [place].   Status of service:  Completed, signed off Medicare Important Message given?  NA - LOS <3 / Initial given by admissions (If response is "NO", the following Medicare IM given date fields will be blank) Date Medicare IM given:   Date Additional Medicare IM given:    Discharge Disposition:  [place]  Per UR Regulation:  Reviewed for med. necessity/level of care/duration of stay  If discussed at [place] of Stay Meetings, dates discussed:    Comments:  12/26/2013 1515 NCM spoke to pt and offered choice for [facility]. Pt agreeable to [facility] for HH. Contacted [facility] for [facility] RN. Requested soc date. Will contact NCM with scheduled visit date. Pt states his girlfriend, Bufford Spikes # [phone] assist him at home. Provided [facility] list with [facility] contact info. Spoke to [facility] and they will provide flushes also for PICC line. Jonnie Finner RN CCM Case Mgmt phone [phone]   12/24/13 KATHY MAHABIR RN,BSN NCM [phone] EGD,COLONOSCOPY TODAY.GI FOLLOWING.NO ANTICIPATED D/C NEEDS.  12/22/13 KATHY MAHABIR RN,BSN NCM [phone] TRANSFER FROM SDU.NO ANTICIPATED D/C NEEDS.  [id number] Rosana Hoes, RN, BSN, [place] [phone] Chart Reviewed for discharge and  hospital needs. Discharge needs at time of review:  None present will follow for needs. Review of patient progress due on [id number].

## 2013-12-26 NOTE — Progress Notes (Signed)
Patient seen, examined, and I agree with the above documentation, including the assessment and plan. H pylori neg Agree with home PPI Will sign off, call with questions

## 2013-12-26 NOTE — Progress Notes (Signed)
    Progress Note   Subjective  feels okay. No bleeding or BMs in two days   Objective   Vital signs in last 24 hours: Temp:  [97.5 F (36.4 C)-98.8 F (37.1 C)] 97.5 F (36.4 C) (02/07 2952) Pulse Rate:  [75-86] 75 (02/07 0633) Resp:  [18-20] 20 (02/07 0633) BP: (132-159)/(52-64) 159/52 mmHg (02/07 0633) SpO2:  [100 %] 100 % (02/07 8413) Weight:  [474 lb 3.4 oz (215.1 kg)] 474 lb 3.4 oz (215.1 kg) (02/07 0909) Last BM Date: 12/24/13 General:    white male in NAD Heart:  Regular rate and rhythm; no murmurs Lungs: Respirations even and unlabored, lungs CTA bilaterally Abdomen:  Soft, nontender and nondistended. Normal bowel sounds. Extremities:  Without edema. Neurologic:  Alert and oriented,  grossly normal neurologically. Psych:  Cooperative. Normal mood and affect.  Lab Results:  Recent Labs  12/25/13 1445 12/25/13 2135 12/26/13 0435  WBC 6.6 6.7 6.2  HGB 7.8* 7.6* 7.5*  HCT 23.6* 23.6* 23.6*  PLT 195 199 196   EGD 12/24/13 FINDINGS: The Z-line was sharp. A small sliding hiatal hernia was  identified. The gastric lumen was normal. In the duodenal bulb a  clean-based nonbleeding ulcer was identified. No evidence of any  old or fresh blood. Retroflexed views revealed no abnormalities.  The scope was then withdrawn from the patient and the procedure  terminated.  COMPLICATIONS: There were no complications.  IMPRESSION:  1) Nonbleeding duodenal bulb ulcer  RECOMMENDATIONS:  1) PPI BID x 1 month an then indefinitely.  2) Check H. pylori serology.    Assessment / Plan:   1. Upper GI bleed secondary to duodenal bulb ulcer. Takes daly ASA at home. H.pylori IgG negative. Daily PPI at home. Follow up with Dr. Benson Norway outpatient.   2. Normocytic anemia secondary to #1. Hgb stable at 7.5. Anemia may also be due in part to chemo for lymphoma. May benefit from iron supplementation upon discharge.    LOS: 6 days   Tye Savoy  12/26/2013, 11:13 AM

## 2013-12-26 NOTE — Progress Notes (Signed)
12/26/2013 1530 NCM received confirmation for soc 2/8 or 2/9 for PICC line care. Jonnie Finner RN CCM Case Mgmt phone [phone]

## 2013-12-29 ENCOUNTER — Other Ambulatory Visit (HOSPITAL_BASED_OUTPATIENT_CLINIC_OR_DEPARTMENT_OTHER): Payer: BC Managed Care – PPO

## 2013-12-29 DIAGNOSIS — C8589 Other specified types of non-Hodgkin lymphoma, extranodal and solid organ sites: Secondary | ICD-10-CM

## 2013-12-29 DIAGNOSIS — C8590 Non-Hodgkin lymphoma, unspecified, unspecified site: Secondary | ICD-10-CM

## 2013-12-29 LAB — CBC WITH DIFFERENTIAL/PLATELET
BASO%: 0.1 % (ref 0.0–2.0)
Basophils Absolute: 0 10*3/uL (ref 0.0–0.1)
EOS ABS: 0.3 10*3/uL (ref 0.0–0.5)
EOS%: 4.1 % (ref 0.0–7.0)
HEMATOCRIT: 27.7 % — AB (ref 38.4–49.9)
HGB: 8.7 g/dL — ABNORMAL LOW (ref 13.0–17.1)
LYMPH%: 12.8 % — AB (ref 14.0–49.0)
MCH: 27.2 pg (ref 27.2–33.4)
MCHC: 31.4 g/dL — ABNORMAL LOW (ref 32.0–36.0)
MCV: 86.6 fL (ref 79.3–98.0)
MONO#: 0.9 10*3/uL (ref 0.1–0.9)
MONO%: 12 % (ref 0.0–14.0)
NEUT#: 5.2 10*3/uL (ref 1.5–6.5)
NEUT%: 71 % (ref 39.0–75.0)
Platelets: 192 10*3/uL (ref 140–400)
RBC: 3.2 10*6/uL — ABNORMAL LOW (ref 4.20–5.82)
RDW: 15.9 % — ABNORMAL HIGH (ref 11.0–14.6)
WBC: 7.3 10*3/uL (ref 4.0–10.3)
lymph#: 0.9 10*3/uL (ref 0.9–3.3)

## 2013-12-29 LAB — COMPREHENSIVE METABOLIC PANEL (CC13)
ALBUMIN: 2.8 g/dL — AB (ref 3.5–5.0)
ALK PHOS: 73 U/L (ref 40–150)
ALT: 11 U/L (ref 0–55)
AST: 12 U/L (ref 5–34)
Anion Gap: 10 mEq/L (ref 3–11)
BILIRUBIN TOTAL: 0.81 mg/dL (ref 0.20–1.20)
BUN: 9.7 mg/dL (ref 7.0–26.0)
CO2: 28 mEq/L (ref 22–29)
Calcium: 9.5 mg/dL (ref 8.4–10.4)
Chloride: 101 mEq/L (ref 98–109)
Creatinine: 0.9 mg/dL (ref 0.7–1.3)
GLUCOSE: 150 mg/dL — AB (ref 70–140)
POTASSIUM: 4 meq/L (ref 3.5–5.1)
SODIUM: 139 meq/L (ref 136–145)
Total Protein: 7.1 g/dL (ref 6.4–8.3)

## 2014-01-05 ENCOUNTER — Other Ambulatory Visit: Payer: Self-pay | Admitting: Internal Medicine

## 2014-01-05 ENCOUNTER — Other Ambulatory Visit (HOSPITAL_BASED_OUTPATIENT_CLINIC_OR_DEPARTMENT_OTHER): Payer: BC Managed Care – PPO

## 2014-01-05 ENCOUNTER — Ambulatory Visit (HOSPITAL_BASED_OUTPATIENT_CLINIC_OR_DEPARTMENT_OTHER): Payer: BC Managed Care – PPO

## 2014-01-05 VITALS — BP 101/61 | HR 85 | Temp 98.3°F | Resp 17

## 2014-01-05 DIAGNOSIS — C8589 Other specified types of non-Hodgkin lymphoma, extranodal and solid organ sites: Secondary | ICD-10-CM

## 2014-01-05 DIAGNOSIS — C8590 Non-Hodgkin lymphoma, unspecified, unspecified site: Secondary | ICD-10-CM

## 2014-01-05 DIAGNOSIS — Z5112 Encounter for antineoplastic immunotherapy: Secondary | ICD-10-CM

## 2014-01-05 DIAGNOSIS — Z5111 Encounter for antineoplastic chemotherapy: Secondary | ICD-10-CM

## 2014-01-05 LAB — CBC WITH DIFFERENTIAL/PLATELET
BASO%: 0.1 % (ref 0.0–2.0)
Basophils Absolute: 0 10*3/uL (ref 0.0–0.1)
EOS%: 3.4 % (ref 0.0–7.0)
Eosinophils Absolute: 0.3 10*3/uL (ref 0.0–0.5)
HEMATOCRIT: 29.4 % — AB (ref 38.4–49.9)
HGB: 9.4 g/dL — ABNORMAL LOW (ref 13.0–17.1)
LYMPH%: 8 % — AB (ref 14.0–49.0)
MCH: 27.5 pg (ref 27.2–33.4)
MCHC: 32 g/dL (ref 32.0–36.0)
MCV: 86 fL (ref 79.3–98.0)
MONO#: 0.6 10*3/uL (ref 0.1–0.9)
MONO%: 6.3 % (ref 0.0–14.0)
NEUT#: 7.3 10*3/uL — ABNORMAL HIGH (ref 1.5–6.5)
NEUT%: 82.2 % — AB (ref 39.0–75.0)
PLATELETS: 163 10*3/uL (ref 140–400)
RBC: 3.42 10*6/uL — ABNORMAL LOW (ref 4.20–5.82)
RDW: 15.4 % — ABNORMAL HIGH (ref 11.0–14.6)
WBC: 8.9 10*3/uL (ref 4.0–10.3)
lymph#: 0.7 10*3/uL — ABNORMAL LOW (ref 0.9–3.3)

## 2014-01-05 LAB — COMPREHENSIVE METABOLIC PANEL (CC13)
ALK PHOS: 77 U/L (ref 40–150)
ALT: 7 U/L (ref 0–55)
ANION GAP: 13 meq/L — AB (ref 3–11)
AST: 13 U/L (ref 5–34)
Albumin: 2.9 g/dL — ABNORMAL LOW (ref 3.5–5.0)
BILIRUBIN TOTAL: 0.98 mg/dL (ref 0.20–1.20)
BUN: 14.3 mg/dL (ref 7.0–26.0)
CO2: 26 mEq/L (ref 22–29)
CREATININE: 0.9 mg/dL (ref 0.7–1.3)
Calcium: 9.8 mg/dL (ref 8.4–10.4)
Chloride: 98 mEq/L (ref 98–109)
GLUCOSE: 176 mg/dL — AB (ref 70–140)
Potassium: 3.8 mEq/L (ref 3.5–5.1)
Sodium: 136 mEq/L (ref 136–145)
Total Protein: 7.5 g/dL (ref 6.4–8.3)

## 2014-01-05 LAB — URIC ACID (CC13): URIC ACID, SERUM: 3.9 mg/dL (ref 2.6–7.4)

## 2014-01-05 MED ORDER — ACETAMINOPHEN 325 MG PO TABS
650.0000 mg | ORAL_TABLET | Freq: Once | ORAL | Status: AC
  Administered 2014-01-05: 650 mg via ORAL

## 2014-01-05 MED ORDER — DIPHENHYDRAMINE HCL 25 MG PO CAPS
50.0000 mg | ORAL_CAPSULE | Freq: Once | ORAL | Status: AC
  Administered 2014-01-05: 50 mg via ORAL

## 2014-01-05 MED ORDER — DEXAMETHASONE SODIUM PHOSPHATE 10 MG/ML IJ SOLN
INTRAMUSCULAR | Status: AC
  Filled 2014-01-05: qty 1

## 2014-01-05 MED ORDER — HEPARIN SOD (PORK) LOCK FLUSH 100 UNIT/ML IV SOLN
250.0000 [IU] | Freq: Once | INTRAVENOUS | Status: AC | PRN
  Administered 2014-01-05: 250 [IU]
  Filled 2014-01-05: qty 5

## 2014-01-05 MED ORDER — SODIUM CHLORIDE 0.9 % IJ SOLN
3.0000 mL | INTRAMUSCULAR | Status: DC | PRN
  Administered 2014-01-05: 3 mL via INTRAVENOUS
  Filled 2014-01-05: qty 10

## 2014-01-05 MED ORDER — SODIUM CHLORIDE 0.9 % IV SOLN
Freq: Once | INTRAVENOUS | Status: AC
  Administered 2014-01-05: 09:00:00 via INTRAVENOUS

## 2014-01-05 MED ORDER — DEXAMETHASONE SODIUM PHOSPHATE 10 MG/ML IJ SOLN
10.0000 mg | Freq: Once | INTRAMUSCULAR | Status: AC
  Administered 2014-01-05: 10 mg via INTRAVENOUS

## 2014-01-05 MED ORDER — SODIUM CHLORIDE 0.9 % IV SOLN
90.0000 mg/m2 | Freq: Once | INTRAVENOUS | Status: AC
  Administered 2014-01-05: 297 mg via INTRAVENOUS
  Filled 2014-01-05: qty 3.3

## 2014-01-05 MED ORDER — DIPHENHYDRAMINE HCL 25 MG PO CAPS
ORAL_CAPSULE | ORAL | Status: AC
  Filled 2014-01-05: qty 2

## 2014-01-05 MED ORDER — SODIUM CHLORIDE 0.9 % IV SOLN
375.0000 mg/m2 | Freq: Once | INTRAVENOUS | Status: AC
  Administered 2014-01-05: 1200 mg via INTRAVENOUS
  Filled 2014-01-05: qty 120

## 2014-01-05 MED ORDER — ONDANSETRON 8 MG/50ML IVPB (CHCC)
8.0000 mg | Freq: Once | INTRAVENOUS | Status: AC
  Administered 2014-01-05: 8 mg via INTRAVENOUS

## 2014-01-05 MED ORDER — ACETAMINOPHEN 325 MG PO TABS
ORAL_TABLET | ORAL | Status: AC
  Filled 2014-01-05: qty 2

## 2014-01-05 MED ORDER — ONDANSETRON 8 MG/NS 50 ML IVPB
INTRAVENOUS | Status: AC
  Filled 2014-01-05: qty 8

## 2014-01-05 NOTE — Patient Instructions (Signed)
[facility] Discharge Instructions for Patients Receiving Chemotherapy   Today you received the following chemotherapy agents :  Rituxan,  Treanda.  To help prevent nausea and vomiting after your treatment, we encourage you to take your nausea medication as prescribed by your physician.   If you develop nausea and vomiting that is not controlled by your nausea medication, call the clinic.   BELOW ARE SYMPTOMS THAT SHOULD BE REPORTED IMMEDIATELY:  *FEVER GREATER THAN 100.5 F  *CHILLS WITH OR WITHOUT FEVER  NAUSEA AND VOMITING THAT IS NOT CONTROLLED WITH YOUR NAUSEA MEDICATION  *UNUSUAL SHORTNESS OF BREATH  *UNUSUAL BRUISING OR BLEEDING  TENDERNESS IN MOUTH AND THROAT WITH OR WITHOUT PRESENCE OF ULCERS  *URINARY PROBLEMS  *BOWEL PROBLEMS  UNUSUAL RASH Items with * indicate a potential emergency and should be followed up as soon as possible.  Feel free to call the clinic you have any questions or concerns. The clinic phone number is [phone].    

## 2014-01-05 NOTE — Progress Notes (Signed)
0925- VS/patient stable, rituxan started at 52 ml/hr x 26 ml. 0955- VS/patient stable, rituxan rate increased to 103 ml/hr x 52 ml. No changes in patient condition.  1025- VS/patient stable, rituxan rate increased to 155 ml/hr x 77 ml. No changes in condition 1055- VS/patient stable, rituxan rate increased to 207 ml/hr x 103 ml, at goal rate, no changes at this time. Patient and VS stable .

## 2014-01-06 ENCOUNTER — Ambulatory Visit (HOSPITAL_BASED_OUTPATIENT_CLINIC_OR_DEPARTMENT_OTHER): Payer: BC Managed Care – PPO

## 2014-01-06 ENCOUNTER — Telehealth: Payer: Self-pay | Admitting: *Deleted

## 2014-01-06 ENCOUNTER — Other Ambulatory Visit: Payer: Self-pay | Admitting: *Deleted

## 2014-01-06 ENCOUNTER — Telehealth: Payer: Self-pay | Admitting: Internal Medicine

## 2014-01-06 VITALS — BP 145/71 | HR 97 | Temp 97.6°F | Resp 19

## 2014-01-06 DIAGNOSIS — Z5111 Encounter for antineoplastic chemotherapy: Secondary | ICD-10-CM

## 2014-01-06 DIAGNOSIS — C8589 Other specified types of non-Hodgkin lymphoma, extranodal and solid organ sites: Secondary | ICD-10-CM

## 2014-01-06 DIAGNOSIS — C8590 Non-Hodgkin lymphoma, unspecified, unspecified site: Secondary | ICD-10-CM

## 2014-01-06 MED ORDER — SODIUM CHLORIDE 0.9 % IJ SOLN
10.0000 mL | INTRAMUSCULAR | Status: DC | PRN
Start: 2014-01-06 — End: 2014-01-06
  Administered 2014-01-06: 10 mL
  Filled 2014-01-06: qty 10

## 2014-01-06 MED ORDER — DEXAMETHASONE SODIUM PHOSPHATE 10 MG/ML IJ SOLN
INTRAMUSCULAR | Status: AC
  Filled 2014-01-06: qty 1

## 2014-01-06 MED ORDER — DEXAMETHASONE SODIUM PHOSPHATE 10 MG/ML IJ SOLN
10.0000 mg | Freq: Once | INTRAMUSCULAR | Status: AC
  Administered 2014-01-06: 10 mg via INTRAVENOUS

## 2014-01-06 MED ORDER — ONDANSETRON 8 MG/NS 50 ML IVPB
INTRAVENOUS | Status: AC
  Filled 2014-01-06: qty 8

## 2014-01-06 MED ORDER — SODIUM CHLORIDE 0.9 % IV SOLN
Freq: Once | INTRAVENOUS | Status: AC
  Administered 2014-01-06: 09:00:00 via INTRAVENOUS

## 2014-01-06 MED ORDER — SODIUM CHLORIDE 0.9 % IV SOLN
90.0000 mg/m2 | Freq: Once | INTRAVENOUS | Status: AC
  Administered 2014-01-06: 297 mg via INTRAVENOUS
  Filled 2014-01-06: qty 3.3

## 2014-01-06 MED ORDER — ONDANSETRON 8 MG/50ML IVPB (CHCC)
8.0000 mg | Freq: Once | INTRAVENOUS | Status: AC
  Administered 2014-01-06: 8 mg via INTRAVENOUS

## 2014-01-06 NOTE — Telephone Encounter (Signed)
Gave pt appt for lab,ML for February 2015, emailed Sharyn Lull regarding March appt

## 2014-01-06 NOTE — Patient Instructions (Signed)
[facility] Discharge Instructions for Patients Receiving Chemotherapy  Today you received the following chemotherapy agents treanda.  To help prevent nausea and vomiting after your treatment, we encourage you to take your nausea medication compazine.   If you develop nausea and vomiting that is not controlled by your nausea medication, call the clinic.   BELOW ARE SYMPTOMS THAT SHOULD BE REPORTED IMMEDIATELY:  *FEVER GREATER THAN 100.5 F  *CHILLS WITH OR WITHOUT FEVER  NAUSEA AND VOMITING THAT IS NOT CONTROLLED WITH YOUR NAUSEA MEDICATION  *UNUSUAL SHORTNESS OF BREATH  *UNUSUAL BRUISING OR BLEEDING  TENDERNESS IN MOUTH AND THROAT WITH OR WITHOUT PRESENCE OF ULCERS  *URINARY PROBLEMS  *BOWEL PROBLEMS  UNUSUAL RASH Items with * indicate a potential emergency and should be followed up as soon as possible.  Feel free to call the clinic you have any questions or concerns. The clinic phone number is [phone].    

## 2014-01-06 NOTE — Progress Notes (Signed)
Per Dr Vista Mink, pt needs lab and f/u with AJ next week for symptom mgmt mtg.  Onc tx schedule filled out.  Infusion RN notified so pt can go to schedulers and get his appts.  SLJ

## 2014-01-06 NOTE — Telephone Encounter (Signed)
Called pt left message regarding chemo for [facility]Arch, pt is aware of appt on  February lab and MD , calendar appt was given to pt  today

## 2014-01-06 NOTE — Telephone Encounter (Signed)
Per staff message and POF I have scheduled appts.  JMW  

## 2014-01-12 ENCOUNTER — Ambulatory Visit (HOSPITAL_BASED_OUTPATIENT_CLINIC_OR_DEPARTMENT_OTHER): Payer: BC Managed Care – PPO | Admitting: Physician Assistant

## 2014-01-12 ENCOUNTER — Encounter: Payer: Self-pay | Admitting: Physician Assistant

## 2014-01-12 ENCOUNTER — Other Ambulatory Visit: Payer: Self-pay | Admitting: Medical Oncology

## 2014-01-12 ENCOUNTER — Other Ambulatory Visit: Payer: BC Managed Care – PPO

## 2014-01-12 ENCOUNTER — Ambulatory Visit (HOSPITAL_BASED_OUTPATIENT_CLINIC_OR_DEPARTMENT_OTHER): Payer: BC Managed Care – PPO

## 2014-01-12 ENCOUNTER — Other Ambulatory Visit (HOSPITAL_BASED_OUTPATIENT_CLINIC_OR_DEPARTMENT_OTHER): Payer: BC Managed Care – PPO

## 2014-01-12 ENCOUNTER — Ambulatory Visit (HOSPITAL_COMMUNITY)
Admission: RE | Admit: 2014-01-12 | Discharge: 2014-01-12 | Disposition: A | Discharge status: Home / Self Care | Payer: BC Managed Care – PPO | Source: Ambulatory Visit | Attending: Internal Medicine | Admitting: Internal Medicine

## 2014-01-12 ENCOUNTER — Ambulatory Visit: Payer: BC Managed Care – PPO

## 2014-01-12 VITALS — BP 139/72 | HR 137 | Temp 99.7°F | Resp 20 | Ht 70.0 in | Wt >= 6400 oz

## 2014-01-12 VITALS — BP 89/51 | HR 104 | Temp 98.9°F | Resp 20

## 2014-01-12 DIAGNOSIS — C8590 Non-Hodgkin lymphoma, unspecified, unspecified site: Secondary | ICD-10-CM

## 2014-01-12 DIAGNOSIS — E119 Type 2 diabetes mellitus without complications: Secondary | ICD-10-CM

## 2014-01-12 DIAGNOSIS — C8589 Other specified types of non-Hodgkin lymphoma, extranodal and solid organ sites: Secondary | ICD-10-CM

## 2014-01-12 DIAGNOSIS — D6481 Anemia due to antineoplastic chemotherapy: Secondary | ICD-10-CM

## 2014-01-12 DIAGNOSIS — T451X5A Adverse effect of antineoplastic and immunosuppressive drugs, initial encounter: Secondary | ICD-10-CM | POA: Insufficient documentation

## 2014-01-12 DIAGNOSIS — Z452 Encounter for adjustment and management of vascular access device: Secondary | ICD-10-CM

## 2014-01-12 DIAGNOSIS — M79609 Pain in unspecified limb: Secondary | ICD-10-CM

## 2014-01-12 DIAGNOSIS — I10 Essential (primary) hypertension: Secondary | ICD-10-CM

## 2014-01-12 LAB — CBC WITH DIFFERENTIAL/PLATELET
BASO%: 0.6 % (ref 0.0–2.0)
BASOS ABS: 0 10*3/uL (ref 0.0–0.1)
EOS%: 0.2 % (ref 0.0–7.0)
Eosinophils Absolute: 0 10*3/uL (ref 0.0–0.5)
HEMATOCRIT: 24.3 % — AB (ref 38.4–49.9)
HEMOGLOBIN: 7.9 g/dL — AB (ref 13.0–17.1)
LYMPH%: 0.5 % — ABNORMAL LOW (ref 14.0–49.0)
MCH: 28 pg (ref 27.2–33.4)
MCHC: 32.7 g/dL (ref 32.0–36.0)
MCV: 85.5 fL (ref 79.3–98.0)
MONO#: 0.7 10*3/uL (ref 0.1–0.9)
MONO%: 9.5 % (ref 0.0–14.0)
NEUT#: 6.3 10*3/uL (ref 1.5–6.5)
NEUT%: 89.2 % — AB (ref 39.0–75.0)
PLATELETS: 277 10*3/uL (ref 140–400)
RBC: 2.84 10*6/uL — ABNORMAL LOW (ref 4.20–5.82)
RDW: 16 % — ABNORMAL HIGH (ref 11.0–14.6)
WBC: 7 10*3/uL (ref 4.0–10.3)
lymph#: 0 10*3/uL — ABNORMAL LOW (ref 0.9–3.3)

## 2014-01-12 LAB — PREPARE RBC (CROSSMATCH)

## 2014-01-12 LAB — COMPREHENSIVE METABOLIC PANEL (CC13)
ALT: 9 U/L (ref 0–55)
AST: 15 U/L (ref 5–34)
Albumin: 2.4 g/dL — ABNORMAL LOW (ref 3.5–5.0)
Alkaline Phosphatase: 120 U/L (ref 40–150)
Anion Gap: 14 mEq/L — ABNORMAL HIGH (ref 3–11)
BUN: 33.7 mg/dL — ABNORMAL HIGH (ref 7.0–26.0)
CALCIUM: 9.5 mg/dL (ref 8.4–10.4)
CHLORIDE: 97 meq/L — AB (ref 98–109)
CO2: 23 meq/L (ref 22–29)
CREATININE: 1.5 mg/dL — AB (ref 0.7–1.3)
GLUCOSE: 191 mg/dL — AB (ref 70–140)
Potassium: 3.9 mEq/L (ref 3.5–5.1)
Sodium: 134 mEq/L — ABNORMAL LOW (ref 136–145)
Total Bilirubin: 1.05 mg/dL (ref 0.20–1.20)
Total Protein: 7.2 g/dL (ref 6.4–8.3)

## 2014-01-12 MED ORDER — HEPARIN SOD (PORK) LOCK FLUSH 100 UNIT/ML IV SOLN
250.0000 [IU] | INTRAVENOUS | Status: AC | PRN
  Administered 2014-01-12: 250 [IU]
  Filled 2014-01-12: qty 5

## 2014-01-12 MED ORDER — ACETAMINOPHEN 325 MG PO TABS
650.0000 mg | ORAL_TABLET | Freq: Once | ORAL | Status: AC
  Administered 2014-01-12: 650 mg via ORAL

## 2014-01-12 MED ORDER — DIPHENHYDRAMINE HCL 25 MG PO CAPS
ORAL_CAPSULE | ORAL | Status: AC
  Filled 2014-01-12: qty 1

## 2014-01-12 MED ORDER — OXYCODONE-ACETAMINOPHEN 10-325 MG PO TABS: 1.0000 | ORAL_TABLET | Freq: Four times a day (QID) | ORAL | Status: DC | PRN

## 2014-01-12 MED ORDER — ACETAMINOPHEN 325 MG PO TABS
ORAL_TABLET | ORAL | Status: AC
  Filled 2014-01-12: qty 2

## 2014-01-12 MED ORDER — SODIUM CHLORIDE 0.9 % IV SOLN
250.0000 mL | Freq: Once | INTRAVENOUS | Status: AC
  Administered 2014-01-12: 250 mL via INTRAVENOUS

## 2014-01-12 MED ORDER — DIPHENHYDRAMINE HCL 25 MG PO CAPS
25.0000 mg | ORAL_CAPSULE | Freq: Once | ORAL | Status: AC
  Administered 2014-01-12: 25 mg via ORAL

## 2014-01-12 MED ORDER — SODIUM CHLORIDE 0.9 % IJ SOLN
10.0000 mL | INTRAMUSCULAR | Status: AC | PRN
  Administered 2014-01-12: 10 mL
  Filled 2014-01-12: qty 10

## 2014-01-12 NOTE — Patient Instructions (Signed)
Blood Transfusion  A blood transfusion replaces your blood or some of its parts. Blood is replaced when you have lost blood because of surgery, an accident, or for severe blood conditions like anemia. You can donate blood to be used on yourself if you have a planned surgery. If you lose blood during that surgery, your own blood can be given back to you. Any blood given to you is checked to make sure it matches your blood type. Your temperature, blood pressure, and heart rate (vital signs) will be checked often.  GET HELP RIGHT AWAY IF:   You feel sick to your stomach (nauseous) or throw up (vomit).  You have watery poop (diarrhea).  You have shortness of breath or trouble breathing.  You have blood in your pee (urine) or have dark colored pee.  You have chest pain or tightness.  Your eyes or skin turn yellow (jaundice).  You have a temperature by mouth above 102 F (38.9 C), not controlled by medicine.  You start to shake and have chills.  You develop a a red rash (hives) or feel itchy.  You develop lightheadedness or feel confused.  You develop back, joint, or muscle pain.  You do not feel hungry (lost appetite).  You feel tired, restless, or nervous.  You develop belly (abdominal) cramps. Document Released: 02/01/2009 Document Revised: 01/28/2012 Document Reviewed: 02/01/2009 Exit[employer] Patient Information 2014 [employer].  

## 2014-01-12 NOTE — Patient Instructions (Signed)
Your hemoglobin is low and you will receive a blood transfusion today Continue with weekly labs as scheduled Followup in 3 weeks for another symptom management visit prior to starting her next scheduled cycle of chemotherapy

## 2014-01-12 NOTE — Progress Notes (Addendum)
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   PRIOR THERAPY: None. He was followed by observation by his previous oncologist.  CURRENT THERAPY: Systemic chemotherapy with Bendamustine 90 mg/M2 on days 1 and 2 in addition to Rituxan 375 mg/M2 on day 1 every 4 weeks. First dose 12/08/2013. Status post 2 cycles  INTERVAL HISTORY: Douglas [name] 58 y.o. male returns to the clinic today for follow up visit accompanied by his girlfriend. He presents for symptom management visit after receiving his second cycle of chemotherapy He is noticed some decreased swelling of the left leg but continues to have pain in this area. He requests a refill for his pain medication, Percocet 10/325 mg tablets. He is tolerating his treatment with bendamustine and Rituxan without significant difficulty. He reports occasional night sweats as well as some constipation. He is currently taking stool softeners with mixed results. He denied any fever chills, nausea or vomiting. He denies fatigue or shortness of breath. Denied cough or hemoptysis.    MEDICAL HISTORY: Past Medical History  Diagnosis Date  . Non Hodgkin's lymphoma January 2009  . Diabetes mellitus without complication   . Hypertension   . Sleep apnea   . Lymphadenopathy, inguinal     left    ALLERGIES:  has No Known Allergies.  MEDICATIONS:  Current Outpatient Prescriptions  Medication Sig Dispense Refill  . atorvastatin (LIPITOR) 20 MG tablet Take 20 mg by mouth daily.      [name] docusate sodium (COLACE) 100 MG capsule Take 100 mg by mouth 2 (two) times daily.      . Ferrous Sulfate (IRON) 325 (65 FE) MG TABS Take 325 mg by mouth 2  (two) times daily.  60 each  0  . omeprazole (PRILOSEC) 40 MG capsule Take 1 capsule (40 mg total) by mouth 2 (two) times daily.  60 capsule  0  . pioglitazone-metformin (ACTOPLUS MET) 15-500 MG per tablet Take 1 tablet by mouth 2 (two) times daily with a meal.      . polyethylene glycol (MIRALAX / GLYCOLAX) packet Take 17 g by mouth daily as needed for mild constipation.       . valsartan-hydrochlorothiazide (DIOVAN-HCT) 320-25 MG per tablet Take 1 tablet by mouth daily.      [name] oxyCODONE-acetaminophen (PERCOCET) 10-325 MG per tablet Take 1 tablet by mouth every 6 (six) hours as needed for pain.  60 tablet  0  . prochlorperazine (COMPAZINE) 10 MG tablet Take 1 tablet (10 mg total) by mouth every 6 (six) hours as needed for nausea or vomiting.  60 tablet  0   No current facility-administered medications for this visit.    SURGICAL HISTORY:  Past Surgical History  Procedure Laterality Date  . Biopsy for lymphoma Left 11/02/13    groin  . Colonoscopy N/A 12/22/2013    Procedure: COLONOSCOPY;  Surgeon: Beryle Beams, MD;  Location: [facility]COPY;  Service: Endoscopy;  Laterality: N/A;  . Esophagogastroduodenoscopy N/A 12/22/2013    Procedure: ESOPHAGOGASTRODUODENOSCOPY (EGD);  Surgeon: Beryle Beams, MD;  Location: [facility];  Service: Endoscopy;  Laterality: N/A;  . Ercp N/A 12/24/2013    Procedure: UPPER ENDOSCOPY;  Surgeon: Beryle Beams, MD;  Location: [facility] ORS;  Service: Gastroenterology;  Laterality: N/A;  . Colonoscopy N/A 12/24/2013    Procedure: COLONOSCOPY;  Surgeon: Beryle Beams, MD;  Location: [facility];  Service: Gastroenterology;  Laterality: N/A;    REVIEW OF SYSTEMS:  Constitutional: negative Eyes: negative Ears, nose, mouth, throat, and face: negative Respiratory: negative Cardiovascular: negative Gastrointestinal: positive for constipation Genitourinary:negative Integument/breast: negative Hematologic/lymphatic: negative Musculoskeletal:positive for muscle  weakness Neurological: negative Behavioral/Psych: negative Endocrine: negative Allergic/Immunologic: negative   PHYSICAL EXAMINATION: General appearance: alert, cooperative, fatigued and no distress Head: Normocephalic, without obvious abnormality, atraumatic Neck: no adenopathy, no JVD, supple, symmetrical, trachea midline and thyroid not enlarged, symmetric, no tenderness/mass/nodules Lymph nodes: Cervical, supraclavicular, and axillary nodes normal. Resp: clear to auscultation bilaterally Back: symmetric, no curvature. ROM normal. No CVA tenderness. Cardio: regular rate and rhythm, S1, S2 normal, no murmur, click, rub or gallop GI: soft, non-tender; bowel sounds normal; no masses,  no organomegaly Genitalia: Scrotal swelling Extremities: edema 3+ edema left lower extremity Neurologic: Alert and oriented X 3, normal strength and tone. Normal symmetric reflexes. Normal coordination and gait  ECOG PERFORMANCE STATUS: 1 - Symptomatic but completely ambulatory  Blood pressure 139/72, pulse 137, temperature 99.7 F (37.6 C), temperature source Oral, resp. rate 20, height _0  (1.778 m), weight 444 lb 4.8 oz (201.533 kg). Weight is currently down 30 pounds due to decrease edema  LABORATORY DATA: Lab Results  Component Value Date   WBC 7.0 01/12/2014   HGB 7.9* 01/12/2014   HCT 24.3* 01/12/2014   MCV 85.5 01/12/2014   PLT 277 01/12/2014      Chemistry      Component Value Date/Time   NA 134* 01/12/2014 0946   NA 137 12/23/2013 0540   K 3.9 01/12/2014 0946   K 3.9 12/23/2013 0540   CL 102 12/23/2013 0540   CO2 23 01/12/2014 0946   CO2 26 12/23/2013 0540   BUN 33.7* 01/12/2014 0946   BUN 12 12/23/2013 0540   CREATININE 1.5* 01/12/2014 0946   CREATININE 0.92 12/23/2013 0540      Component Value Date/Time   CALCIUM 9.5 01/12/2014 0946   CALCIUM 8.0* 12/23/2013 0540   ALKPHOS 120 01/12/2014 0946   ALKPHOS 86 10/30/2013 1709   AST 15 01/12/2014 0946   AST 18 10/30/2013 1709   ALT 9 01/12/2014 0946    ALT 8 10/30/2013 1709   BILITOT 1.05 01/12/2014 0946   BILITOT 0.5 10/30/2013 1709       RADIOGRAPHIC STUDIES:  CT CHEST WITH CONTRAST  TECHNIQUE:  Multidetector CT imaging of the chest was performed during  intravenous contrast administration.  CONTRAST: 47m OMNIPAQUE IOHEXOL 300 MG/ML SOLN  COMPARISON: 02/06/2008.  FINDINGS:  The chest wall is unremarkable. No supraclavicular or axillary  lymphadenopathy. Small scattered lymph nodes are noted. The bony  thorax is intact. No destructive bone lesions or spinal canal  compromise. Advanced degenerative changes involving the thoracic  spine with anterior and lateral bridging osteophytes.  The heart is normal in size. No pericardial effusion. No mediastinal  or hilar mass or lymphadenopathy. The aorta is normal in caliber. No  dissection. The esophagus is grossly normal.  Examination of the lung parenchyma demonstrates to pulmonary  nodules. There is a 12.5 mm lesion in the right upper lobe adjacent  to the pleura with a slight a low-fat interstitial change a rounded.  The second nodule is in the right middle lobe on image number 23 and  measures approximately 6 mm. A sub 3 mm subpleural pulmonary nodule  is noted in the right upper lobe on image number 28. The left lung  is clear. No pleural effusion. No acute pulmonary findings. No  interstitial lung disease or bronchiectasis chief  The upper abdomen is grossly normal.  IMPRESSION:  Three right-sided pulmonary nodules. The largest measures 12.5 mm in  the right upper lobe and appears to have some inflammation around  it. Short-term followup noncontrast chest CT is recommended in 3  months to re-evaluate.  No supraclavicular, axillary, mediastinal or hilar lymphadenopathy.  Electronically Signed  By: Kalman Jewels M.D.  On: 12/02/2013 15:00   US Scrotum  11/27/2013   CLINICAL DATA:  58 year old male with history of non-Hodgkin's lymphoma, large left groin mass status post  core biopsy in December. Pathology revealed abnormal lymphoproliferative process. Scrotal mass. Initial encounter.  EXAM: SCROTAL ULTRASOUND  DOPPLER ULTRASOUND OF THE TESTICLES  TECHNIQUE: Complete ultrasound examination of the testicles, epididymis, and other scrotal structures was performed. Color and spectral Doppler ultrasound were also utilized to evaluate blood flow to the testicles.  COMPARISON:  Pelvis CT 10/30/2013.  FINDINGS: Large body habitus. Severe scrotal wall thickening (up to 4.5 cm, see image 6). No sonographic evidence of subcutaneous gas.  On 10/30/2013 a confluent left inguinal soft tissue mass plus left pelvic side wall lymphadenopathy was demonstrated. The mass was incompletely visible but involves muscle, subcutaneous tissue, and possibly the dermis.  On some images of the left scrotal wall today the technologist questioned peristalsis (see series 2 semi images), but this appears to represent real-time vascularity rather than peristalsis, and there was no inguinal hernia on 10/30/2013.  Right testicle  Measurements: 3.9 x 2.9 x 3.0 cm. No mass or microlithiasis visualized.  Left testicle  Measurements: 3.7 x 2.5 x 2.6 cm. No mass or microlithiasis visualized.  Right epididymis: Echogenic, but nonenlarged. No evidence of hypervascularity (image 33).  Left epididymis: Echogenic and mildly enlarged comparing to the right side, thickness up to 11 mm. Mild hypervascularity compared to the right side (image 52).  Hydrocele:  None visualized.  Varicocele:  None visualized.  Pulsed Doppler interrogation of both testes demonstrates low resistance arterial and venous waveforms bilaterally.  IMPRESSION: 1. Severe scrotal wall thickening up to 4.5 cm, with an infiltrative appearance similar to the left inguinal process demonstrated on 11/02/2013. Repeat pelvis CT with IV contrast to include the entire scrotum may be beneficial in providing overall characterization of this process.  2. No testicular  mass.  No hydrocele.  3. The left epididymis does appear mildly enlarged and hypervascular, but the etiology in significance is unclear.   Electronically Signed   By: Lars Pinks M.D.   On: 11/27/2013 12:08   US Biopsy  11/02/2013   CLINICAL DATA:  History of non-Hodgkin's lymphoma. The patient has a large left groin mass.  EXAM: ULTRASOUND-GUIDED BIOPSY OF LEFT GROIN MASS  Physician: Stephan Minister. Henn, MD  MEDICATIONS: Versed 2 mg, fentanyl 100 mcg. A radiology nurse monitored the patient for moderate sedation.  ANESTHESIA/SEDATION: Moderate sedation time: 25 min  FLUOROSCOPY TIME:  None  PROCEDURE: The procedure was explained to the patient. The risks and benefits of the procedure were discussed and the patient's questions were addressed. Informed consent was obtained from the patient. The left upper thigh and groin region was evaluated with ultrasound. There is ill-defined abnormal soft tissue along the medial aspect of the left groin which corresponds with the recent CT findings. The left upper groin was prepped and draped in sterile fashion. Skin was anesthetized  with 1% lidocaine. A 17 gauge coaxial needle was directed into the soft tissue lesion. A total of 6 core biopsies were obtained with an 18 gauge device. Samples were placed in saline. 17 gauge needle was removed without complication.  COMPLICATIONS: None  FINDINGS: Ill-defined soft tissue mass involving the medial left upper groin.  IMPRESSION: Ultrasound-guided core biopsies of the left groin mass.   Electronically Signed   By: Markus Daft M.D.   On: 11/02/2013 17:49   [place] Art/ven Flow Abd Pelv Doppler  11/27/2013   CLINICAL DATA:  57 year old male with history of non-Hodgkin's lymphoma, large left groin mass status post core biopsy in December. Pathology revealed abnormal lymphoproliferative process. Scrotal mass. Initial encounter.  EXAM: SCROTAL ULTRASOUND  DOPPLER ULTRASOUND OF THE TESTICLES  TECHNIQUE: Complete ultrasound examination of the  testicles, epididymis, and other scrotal structures was performed. Color and spectral Doppler ultrasound were also utilized to evaluate blood flow to the testicles.  COMPARISON:  Pelvis CT 10/30/2013.  FINDINGS: Large body habitus. Severe scrotal wall thickening (up to 4.5 cm, see image 6). No sonographic evidence of subcutaneous gas.  On 10/30/2013 a confluent left inguinal soft tissue mass plus left pelvic side wall lymphadenopathy was demonstrated. The mass was incompletely visible but involves muscle, subcutaneous tissue, and possibly the dermis.  On some images of the left scrotal wall today the technologist questioned peristalsis (see series 2 semi images), but this appears to represent real-time vascularity rather than peristalsis, and there was no inguinal hernia on 10/30/2013.  Right testicle  Measurements: 3.9 x 2.9 x 3.0 cm. No mass or microlithiasis visualized.  Left testicle  Measurements: 3.7 x 2.5 x 2.6 cm. No mass or microlithiasis visualized.  Right epididymis: Echogenic, but nonenlarged. No evidence of hypervascularity (image 33).  Left epididymis: Echogenic and mildly enlarged comparing to the right side, thickness up to 11 mm. Mild hypervascularity compared to the right side (image 52).  Hydrocele:  None visualized.  Varicocele:  None visualized.  Pulsed Doppler interrogation of both testes demonstrates low resistance arterial and venous waveforms bilaterally.  IMPRESSION: 1. Severe scrotal wall thickening up to 4.5 cm, with an infiltrative appearance similar to the left inguinal process demonstrated on 11/02/2013. Repeat pelvis CT with IV contrast to include the entire scrotum may be beneficial in providing overall characterization of this process.  2. No testicular mass.  No hydrocele.  3. The left epididymis does appear mildly enlarged and hypervascular, but the etiology in significance is unclear.   Electronically Signed   By: Lars Pinks M.D.   On: 11/27/2013 12:08    ASSESSMENT AND PLAN:  This is a very pleasant 58 years old morbidly obese African American male with progressive follicular non-Hodgkin lymphoma initially diagnosed in 2009 but the patient has not received any treatment since that time and was on observation. He was seen in the past by Dr. Tressie Stalker and was lost to follow up with him for almost 4 years. Unable to get a PET scan performed for restaging workup of this patient because of his body weight and size. CT of the chest revealed 3 pulmonary nodules. He is currently status post 2 cycles of systemic chemotherapy with Bendamustine 90 mg/M2 on days 1 and 2 as well as Rituxan 375 mg/M2 on day 1 every 4 weeks. Patient was discussed with and also seen by Dr. Julien Nordmann. 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 We will  arrange to transfuse him 1 unit of packed red blood cells today. Patient is in agreement with this plan. He will continue with weekly labs as scheduled and return in 3 weeks for another symptom management visit prior to the start of cycle #3 of his systemic chemotherapy. For pain management, he will continue on Percocet 10/325 mg by mouth every 6 hours as needed for pain and he was given a refill prescription for this medication.  The patient voices understanding of current disease status and treatment options and is in agreement with the current care plan.  All questions were answered. The patient knows to call the clinic with any problems, questions or concerns. We can certainly see the patient much sooner if necessary.  Carlton Adam PA-C  ADDENDUM: Hematology/Oncology Attending: I had the face to face encounter with the patient today. I recommended her care plan. This is a very pleasant 58 years old morbidly obese African American male with recurrent follicular low-grade non-Hodgkin lymphoma. He is currently undergoing systemic chemotherapy with bendamustine and  Rituxan is status post 2 cycles. The patient is tolerating his treatment fairly well with no significant adverse effects. He start noticed some improvement in the swelling of the left lower extremity and he missed several pounds recently most likely secondary to an improvement of the fluid retention and swelling of the lower extremity. He would come back for followup visit in 3 weeks for reevaluation before starting cycle #3. He was advised to call immediately if he has any concerning symptoms in the interval. For pain management the patient will continue on Percocet 10/325 mg by mouth every 6 hours as needed.  Disclaimer: This note was dictated with voice recognition software. Similar sounding words can inadvertently be transcribed and may not be corrected upon review. Eilleen Kempf., MD 01/12/2014

## 2014-01-12 NOTE — Progress Notes (Signed)
Before discharging patient, this RN went to change PICC line dressing due to it coming off. After removing dressing, it was noted that more than 2 cm of the line was exposed (2cm of line exposed was charted by RN that placed PICC line). Blood return noted in both lumens and they both flushed easily. New dressing applied. Dr. Julien Nordmann notified and order for chest x-ray placed for tomorrow before any chemotherapy given through PICC line. Cindi Carbon, RN

## 2014-01-13 ENCOUNTER — Telehealth: Payer: Self-pay | Admitting: Internal Medicine

## 2014-01-13 LAB — TYPE AND SCREEN
ABO/RH(D): A POS
Antibody Screen: NEGATIVE
UNIT DIVISION: 0

## 2014-01-13 NOTE — Telephone Encounter (Signed)
, °

## 2014-01-18 ENCOUNTER — Telehealth: Payer: Self-pay | Admitting: Certified Registered Nurse Anesthetist

## 2014-01-18 ENCOUNTER — Telehealth: Payer: Self-pay | Admitting: Internal Medicine

## 2014-01-18 NOTE — Telephone Encounter (Signed)
Per Dr Julien Nordmann pt needs to maintain PICC line. Hoyle Sauer will flush this week and leave flushes for family to flush . Needs Monday , Thursday flush appts.[facility] [place] request sent.

## 2014-01-18 NOTE — Telephone Encounter (Signed)
Call transferred to me from [place] at [facility]. Phone: [phone]. She will be visiting this patient in about one hour and plans is to discharge him from [facility].  She would need some instructions from physician whether to remove PICC or keep PICC. If PICC is to be left in, patient and wife will need to arrange for appt. at [facility] for flushes and dressing change.  Will relay this message to Dr. Julien Nordmann and his primary nurse Abelina Bachelor.

## 2014-01-18 NOTE — Telephone Encounter (Signed)
, °

## 2014-01-19 ENCOUNTER — Telehealth: Payer: Self-pay | Admitting: Medical Oncology

## 2014-01-19 ENCOUNTER — Telehealth: Payer: Self-pay | Admitting: Internal Medicine

## 2014-01-19 ENCOUNTER — Encounter (INDEPENDENT_AMBULATORY_CARE_PROVIDER_SITE_OTHER): Payer: Self-pay | Admitting: General Surgery

## 2014-01-19 ENCOUNTER — Other Ambulatory Visit (INDEPENDENT_AMBULATORY_CARE_PROVIDER_SITE_OTHER): Payer: Self-pay | Admitting: General Surgery

## 2014-01-19 ENCOUNTER — Telehealth (INDEPENDENT_AMBULATORY_CARE_PROVIDER_SITE_OTHER): Payer: Self-pay

## 2014-01-19 ENCOUNTER — Ambulatory Visit (HOSPITAL_BASED_OUTPATIENT_CLINIC_OR_DEPARTMENT_OTHER): Payer: BC Managed Care – PPO

## 2014-01-19 ENCOUNTER — Ambulatory Visit (INDEPENDENT_AMBULATORY_CARE_PROVIDER_SITE_OTHER): Payer: BC Managed Care – PPO | Admitting: General Surgery

## 2014-01-19 ENCOUNTER — Other Ambulatory Visit (HOSPITAL_BASED_OUTPATIENT_CLINIC_OR_DEPARTMENT_OTHER): Payer: BC Managed Care – PPO

## 2014-01-19 ENCOUNTER — Other Ambulatory Visit: Payer: Self-pay | Admitting: Medical Oncology

## 2014-01-19 VITALS — BP 134/88 | HR 82 | Temp 97.0°F | Resp 20 | Ht 70.0 in | Wt >= 6400 oz

## 2014-01-19 VITALS — BP 143/64 | HR 124 | Temp 98.0°F

## 2014-01-19 DIAGNOSIS — C8590 Non-Hodgkin lymphoma, unspecified, unspecified site: Secondary | ICD-10-CM

## 2014-01-19 DIAGNOSIS — Z95828 Presence of other vascular implants and grafts: Secondary | ICD-10-CM

## 2014-01-19 DIAGNOSIS — C8589 Other specified types of non-Hodgkin lymphoma, extranodal and solid organ sites: Secondary | ICD-10-CM

## 2014-01-19 DIAGNOSIS — L02219 Cutaneous abscess of trunk, unspecified: Secondary | ICD-10-CM | POA: Insufficient documentation

## 2014-01-19 DIAGNOSIS — L03319 Cellulitis of trunk, unspecified: Principal | ICD-10-CM

## 2014-01-19 DIAGNOSIS — Z452 Encounter for adjustment and management of vascular access device: Secondary | ICD-10-CM

## 2014-01-19 DIAGNOSIS — L02214 Cutaneous abscess of groin: Secondary | ICD-10-CM

## 2014-01-19 LAB — CBC WITH DIFFERENTIAL/PLATELET
BASO%: 0.2 % (ref 0.0–2.0)
BASOS ABS: 0 10*3/uL (ref 0.0–0.1)
EOS%: 0 % (ref 0.0–7.0)
Eosinophils Absolute: 0 10*3/uL (ref 0.0–0.5)
HEMATOCRIT: 26 % — AB (ref 38.4–49.9)
HEMOGLOBIN: 8.4 g/dL — AB (ref 13.0–17.1)
LYMPH#: 0 10*3/uL — AB (ref 0.9–3.3)
LYMPH%: 0.6 % — ABNORMAL LOW (ref 14.0–49.0)
MCH: 27.9 pg (ref 27.2–33.4)
MCHC: 32.5 g/dL (ref 32.0–36.0)
MCV: 86.1 fL (ref 79.3–98.0)
MONO#: 0.9 10*3/uL (ref 0.1–0.9)
MONO%: 11.6 % (ref 0.0–14.0)
NEUT#: 6.9 10*3/uL — ABNORMAL HIGH (ref 1.5–6.5)
NEUT%: 87.6 % — AB (ref 39.0–75.0)
Platelets: 477 10*3/uL — ABNORMAL HIGH (ref 140–400)
RBC: 3.02 10*6/uL — ABNORMAL LOW (ref 4.20–5.82)
RDW: 16.2 % — AB (ref 11.0–14.6)
WBC: 7.9 10*3/uL (ref 4.0–10.3)

## 2014-01-19 LAB — COMPREHENSIVE METABOLIC PANEL (CC13)
ALT: 15 U/L (ref 0–55)
AST: 23 U/L (ref 5–34)
Albumin: 2 g/dL — ABNORMAL LOW (ref 3.5–5.0)
Alkaline Phosphatase: 186 U/L — ABNORMAL HIGH (ref 40–150)
Anion Gap: 13 mEq/L — ABNORMAL HIGH (ref 3–11)
BILIRUBIN TOTAL: 0.44 mg/dL (ref 0.20–1.20)
BUN: 15.8 mg/dL (ref 7.0–26.0)
CO2: 27 mEq/L (ref 22–29)
Calcium: 9.8 mg/dL (ref 8.4–10.4)
Chloride: 97 mEq/L — ABNORMAL LOW (ref 98–109)
Creatinine: 0.9 mg/dL (ref 0.7–1.3)
Glucose: 204 mg/dl — ABNORMAL HIGH (ref 70–140)
Potassium: 3.7 mEq/L (ref 3.5–5.1)
Sodium: 137 mEq/L (ref 136–145)
Total Protein: 7.3 g/dL (ref 6.4–8.3)

## 2014-01-19 MED ORDER — DOXYCYCLINE HYCLATE 100 MG PO TABS: 100.0000 mg | ORAL_TABLET | Freq: Two times a day (BID) | ORAL | Status: DC

## 2014-01-19 MED ORDER — SODIUM CHLORIDE 0.9 % IJ SOLN
10.0000 mL | INTRAMUSCULAR | Status: DC | PRN
  Administered 2014-01-19: 10 mL via INTRAVENOUS
  Filled 2014-01-19: qty 10

## 2014-01-19 MED ORDER — HEPARIN SOD (PORK) LOCK FLUSH 100 UNIT/ML IV SOLN
500.0000 [IU] | Freq: Once | INTRAVENOUS | Status: AC
  Administered 2014-01-19: 250 [IU] via INTRAVENOUS
  Filled 2014-01-19: qty 5

## 2014-01-19 NOTE — Telephone Encounter (Signed)
Unable to arrange for [facility] to see pt tomorrow for wound dressing change.  Made appt for 9:00 a.m for the patient to see Dr. Bertrum Sol nurse.  Still working on [facility] referral.

## 2014-01-19 NOTE — Telephone Encounter (Signed)
Pt reports he has an open wound "busted open' on left thigh where core bx procedure done. He describes "white drainage at site with redness . He canot tell me size except to say it is cover with "3 dressings" . I told him someone will see him today after labs.

## 2014-01-19 NOTE — Telephone Encounter (Signed)
Called to get urgent appt with surgeon today for draining left groin lesion. Appointment given to pt . Dry Dressing applied to lesion .

## 2014-01-19 NOTE — Patient Instructions (Signed)
Keep blood sugars under good control. Take antibiotic as directed. You will need normal saline damp to dry dressing change once or twice a day.

## 2014-01-19 NOTE — Progress Notes (Signed)
Patient ID: Douglas [name], male   DOB: 04-12-1956, 58 y.o.   MRN: [id number]  Chief Complaint  Patient presents with  . Follow-up    HPI Douglas [name] is a 58 y.o. male.   HPI  He is referred by Dr. Julien Nordmann for evaluation of a draining left groin wound. He was diagnosed with lymphoma by way of a large core needle biopsy in the left groin area. He's been receiving chemotherapy. 2 days ago he noted swelling in the area and then it began spontaneously draining. He called Dr. [name] office and he referred him here. No fever or chills. His blood sugars have been well controlled.  Past Medical History  Diagnosis Date  . Non Hodgkin's lymphoma January 2009  . Diabetes mellitus without complication   . Hypertension   . Sleep apnea   . Lymphadenopathy, inguinal     left    Past Surgical History  Procedure Laterality Date  . Biopsy for lymphoma Left 11/02/13    groin  . Colonoscopy N/A 12/22/2013    Procedure: COLONOSCOPY;  Surgeon: Beryle Beams, MD;  Location: [facility];  Service: Endoscopy;  Laterality: N/A;  . Esophagogastroduodenoscopy N/A 12/22/2013    Procedure: ESOPHAGOGASTRODUODENOSCOPY (EGD);  Surgeon: Beryle Beams, MD;  Location: [facility];  Service: Endoscopy;  Laterality: N/A;  . Ercp N/A 12/24/2013    Procedure: UPPER ENDOSCOPY;  Surgeon: Beryle Beams, MD;  Location: [facility];  Service: Gastroenterology;  Laterality: N/A;  . Colonoscopy N/A 12/24/2013    Procedure: COLONOSCOPY;  Surgeon: Beryle Beams, MD;  Location: [facility];  Service: Gastroenterology;  Laterality: N/A;    History reviewed. No pertinent family history.  Social History History  Substance Use Topics  . Smoking status: Never Smoker   . Smokeless tobacco: Not on file  . Alcohol Use: No    No Known Allergies  Current Outpatient Prescriptions  Medication Sig Dispense Refill  . atorvastatin (LIPITOR) 20 MG tablet Take 20 mg by mouth daily.      [name] docusate sodium (COLACE) 100 MG capsule Take  100 mg by mouth 2 (two) times daily.      . Ferrous Sulfate (IRON) 325 (65 FE) MG TABS Take 325 mg by mouth 2 (two) times daily.  60 each  0  . omeprazole (PRILOSEC) 40 MG capsule Take 1 capsule (40 mg total) by mouth 2 (two) times daily.  60 capsule  0  . oxyCODONE-acetaminophen (PERCOCET) 10-325 MG per tablet Take 1 tablet by mouth every 6 (six) hours as needed for pain.  60 tablet  0  . pioglitazone-metformin (ACTOPLUS MET) 15-500 MG per tablet Take 1 tablet by mouth 2 (two) times daily with a meal.      . polyethylene glycol (MIRALAX / GLYCOLAX) packet Take 17 g by mouth daily as needed for mild constipation.       . prochlorperazine (COMPAZINE) 10 MG tablet Take 1 tablet (10 mg total) by mouth every 6 (six) hours as needed for nausea or vomiting.  60 tablet  0  . valsartan-hydrochlorothiazide (DIOVAN-HCT) 320-25 MG per tablet Take 1 tablet by mouth daily.      [name] doxycycline (VIBRA-TABS) 100 MG tablet Take 1 tablet (100 mg total) by mouth 2 (two) times daily.  42 tablet  0   No current facility-administered medications for this visit.    Review of Systems Review of Systems  Constitutional: Negative for fever and chills.  Genitourinary:       Mild left groin discomfort  and a draining wound.  Musculoskeletal:       Intermittent pain in left leg.    Blood pressure 134/88, pulse 82, temperature 97 F (36.1 C), temperature source Oral, resp. rate 20, height _0  (1.778 m), weight 430 lb (195.047 kg).  Physical Exam Physical Exam  Constitutional: No distress.  Morbidly obese male.  HENT:  Head: Normocephalic and atraumatic.  Musculoskeletal:  There is a necrotic appearing the left groin wound with a surrounding area of erythema and induration. This measures approximately 12-15 cm with respect to the erythema and induration. There is purulent drainage coming from the wound.  An incision and drainage procedure was performed after discussion with him. I anesthetized the surrounding  skin with Xylocaine. Using sharp dissection I excised necrotic tissue as well as open up the loculated pockets. This provided for adequate drainage. This left a fairly significant open wound. There is a deep tract extending medially for 6-7 cm. Bleeding was controlled with silver nitrate sticks. The wound was then packed with saline soaked gauze ( 2 pieces of gauze).  Neurological: He is alert.    Data Reviewed Telephone notes from the cancer center. Laboratory values.  Assessment    Complicated left groin abscess status post incision and drainage. He is immunocompromised because of chemotherapy. Also has diabetes mellitus.     Plan    Start receiving wet-to-dry dressing changes once to twice a day. We'll arrange for home health to assist with this. Doxycycline 100 mg twice a day for 3 weeks. Return visit in 3 days. If the wound looks worse, he may need to be taken to the operating room for more aggressive debridement.        Douglas [name] 01/19/2014, 2:53 PM

## 2014-01-19 NOTE — Progress Notes (Signed)
Per pt dressing was changed yesterday.

## 2014-01-19 NOTE — Telephone Encounter (Signed)
s/w pt re picc flush appts per 3/2 pof. per pt his picc is being flushed by his girlfriend. pt states office was not aware and was asked to call to speak w/desk nurse re picc so he can be advised on how his doing this. message to desk nurse to call pt re picc care. pt ware of next picc appt for 3/5 and next lb appt for 3/9. pt will get schedule at one of these appts and is aware flush appt can be cx'd if provider give order.............[name] AM waitning on response from desk nurses re 3/18 f/u.

## 2014-01-20 ENCOUNTER — Encounter (INDEPENDENT_AMBULATORY_CARE_PROVIDER_SITE_OTHER): Payer: BC Managed Care – PPO

## 2014-01-20 ENCOUNTER — Other Ambulatory Visit: Payer: Self-pay | Admitting: *Deleted

## 2014-01-20 ENCOUNTER — Telehealth: Payer: Self-pay | Admitting: Internal Medicine

## 2014-01-20 DIAGNOSIS — C8590 Non-Hodgkin lymphoma, unspecified, unspecified site: Secondary | ICD-10-CM

## 2014-01-20 NOTE — Telephone Encounter (Signed)
, °

## 2014-01-20 NOTE — Progress Notes (Signed)
Per Dr Vista Mink, pt needs PICC flushes on Mondays and Thursdays with weekly lab work drawn.  Order sent through [facility] to [facility].  Will inform schedulers to cancel pt's flush and weekly lab work appts.  Informed pt of information.  SLJ

## 2014-01-21 ENCOUNTER — Telehealth: Payer: Self-pay | Admitting: *Deleted

## 2014-01-21 ENCOUNTER — Telehealth: Payer: Self-pay | Admitting: Internal Medicine

## 2014-01-21 NOTE — Progress Notes (Signed)
Confirmed with Malachy Mood at [facility] that pt's PICC is a power PICC and needs to be flushed on Mondays and Thursdays with 24ml Normal Saline and 2.5 ml  (250 units) heparin.  Malachy Mood verbalized understanding.  Ph # [phone].  SLJ

## 2014-01-21 NOTE — Telephone Encounter (Signed)
per staff message response from desk nurse wkly lb draw and flush appts after 3/5 to be cx'd. per nurse this will be done by adv home care but pt to keep 3/5 appt. per desk nurse pt is aware. other appts remain the same.

## 2014-01-21 NOTE — Telephone Encounter (Signed)
Per staff message and POF I have scheduled appts. I have also adjusted 3/19 appt JMW

## 2014-01-22 ENCOUNTER — Encounter (INDEPENDENT_AMBULATORY_CARE_PROVIDER_SITE_OTHER): Payer: Self-pay | Admitting: General Surgery

## 2014-01-22 ENCOUNTER — Ambulatory Visit (INDEPENDENT_AMBULATORY_CARE_PROVIDER_SITE_OTHER): Payer: BC Managed Care – PPO | Admitting: General Surgery

## 2014-01-22 VITALS — BP 136/76 | HR 136 | Temp 97.0°F | Resp 15 | Ht 70.0 in | Wt >= 6400 oz

## 2014-01-22 DIAGNOSIS — Z4889 Encounter for other specified surgical aftercare: Secondary | ICD-10-CM

## 2014-01-22 NOTE — Patient Instructions (Signed)
Shower before each dressing change.

## 2014-01-22 NOTE — Progress Notes (Signed)
He is here for a wound check following incision and drainage of a large left groin abscess. This is likely secondary to a necrotic large lymph node that was responding to chemotherapy.  On exam, the packing is very superficial. I probed the wound medially and a moderate amount of serous fluid was evacuated. The wound was then repacked with moistened gauze a bulky dressing was applied. The induration is about the same.  Assessment: Large left groin abscess status post incision and drainage.  Plan: Continue antibiotics and dressing changes. Return visit next week.

## 2014-01-25 ENCOUNTER — Other Ambulatory Visit: Payer: BC Managed Care – PPO

## 2014-01-26 ENCOUNTER — Telehealth: Payer: Self-pay | Admitting: *Deleted

## 2014-01-26 DIAGNOSIS — C8590 Non-Hodgkin lymphoma, unspecified, unspecified site: Secondary | ICD-10-CM

## 2014-01-26 NOTE — Telephone Encounter (Signed)
Weekly lab work drawn by [facility] given to Dr Vista Mink to review.  Hbg 8.2.  Will order type and hold for lab appt 02/03/14.  SLJ

## 2014-01-28 ENCOUNTER — Ambulatory Visit (INDEPENDENT_AMBULATORY_CARE_PROVIDER_SITE_OTHER): Payer: BC Managed Care – PPO | Admitting: General Surgery

## 2014-01-28 ENCOUNTER — Encounter (INDEPENDENT_AMBULATORY_CARE_PROVIDER_SITE_OTHER): Payer: Self-pay | Admitting: General Surgery

## 2014-01-28 ENCOUNTER — Telehealth (INDEPENDENT_AMBULATORY_CARE_PROVIDER_SITE_OTHER): Payer: Self-pay

## 2014-01-28 VITALS — BP 132/74 | HR 80 | Temp 98.7°F | Ht 70.0 in | Wt >= 6400 oz

## 2014-01-28 DIAGNOSIS — Z4889 Encounter for other specified surgical aftercare: Secondary | ICD-10-CM

## 2014-01-28 NOTE — Progress Notes (Signed)
He is here for aother wound check following incision and drainage of a large left groin abscess. His wife is doing the dressing changes. Apparently, he is due to have some chemotherapy next week.  PE:  Left groin wound is clean with less induration and no purulent drainage  Assessment: Large left groin abscess status post incision and drainage-slowly getting better.  Plan: Continue antibiotics and dressing changes. Return visit 2 weeks.  Would hold off on the chemotherapy a little longer.

## 2014-01-28 NOTE — Telephone Encounter (Signed)
Patient return call to the office.  Patient advised to arrive at 4:00 today with his appointment with Dr. Zella Richer.

## 2014-01-28 NOTE — Patient Instructions (Signed)
Continue current wound care

## 2014-02-01 ENCOUNTER — Telehealth: Payer: Self-pay | Admitting: *Deleted

## 2014-02-01 NOTE — Telephone Encounter (Signed)
[facility] called stating that labs shows hbg 7.7 and K of 5.9.  We received the CBC and part of the CMET, but did not receive the lab results stating K 5.9.  They will attempt to fax results again.  Per Dr Vista Mink, pt needs repeat lab work done tomorrow and to hold any K supplements if he is taking them.  Spoke to pt, he denies any K supplements.  Pt verbalized understanding.  SLJ

## 2014-02-01 NOTE — Telephone Encounter (Signed)
Per dr Vista Mink as noted below, onc tx schedule filled out.  Called pt and informed him that schedulers will be calling with new appts.  SLJ

## 2014-02-01 NOTE — Telephone Encounter (Signed)
Message copied by Britt Bottom on Mon Feb 01, 2014  9:53 AM ------      Message from: [name]      Created: Fri Jan 29, 2014  4:13 PM       Delay him by one more week with follow up.      ----- Message -----         From: Anders Grant, RN         Sent: 01/29/2014  12:09 PM           To: [name], MD            Pt has chemo on 3/18 and 3/19 and a f/u scheduled 3/19.  Do you still want to see him in clinic with lab work on 3/19 even if the chemo is held?                  ----- Message -----         From: [name], MD         Sent: 01/29/2014  11:38 AM           To: [name], MD, #            OK. Thank you.      ----- Message -----         From: [name], MD         Sent: 01/28/2014   4:22 PM           To: [name], MD            I saw him today. We are just getting the infection under control. He has a large open wound. I would hold off on his chemotherapy next week and give the wound a little longer to heal.            Sherren Mocha R.                   ------

## 2014-02-02 ENCOUNTER — Telehealth: Payer: Self-pay | Admitting: Medical Oncology

## 2014-02-02 ENCOUNTER — Inpatient Hospital Stay (HOSPITAL_COMMUNITY)
Admission: EM | Admit: 2014-02-02 | Discharge: 2014-02-05 | DRG: 683 | Disposition: A | Discharge status: Home Health | Payer: BC Managed Care – PPO | Attending: Internal Medicine | Admitting: Internal Medicine

## 2014-02-02 ENCOUNTER — Encounter (HOSPITAL_COMMUNITY): Payer: Self-pay | Admitting: Emergency Medicine

## 2014-02-02 ENCOUNTER — Other Ambulatory Visit: Payer: Self-pay

## 2014-02-02 DIAGNOSIS — Z6841 Body Mass Index (BMI) 40.0 and over, adult: Secondary | ICD-10-CM

## 2014-02-02 DIAGNOSIS — L02219 Cutaneous abscess of trunk, unspecified: Secondary | ICD-10-CM | POA: Diagnosis present

## 2014-02-02 DIAGNOSIS — I10 Essential (primary) hypertension: Secondary | ICD-10-CM | POA: Diagnosis present

## 2014-02-02 DIAGNOSIS — E785 Hyperlipidemia, unspecified: Secondary | ICD-10-CM | POA: Diagnosis present

## 2014-02-02 DIAGNOSIS — E875 Hyperkalemia: Secondary | ICD-10-CM | POA: Diagnosis present

## 2014-02-02 DIAGNOSIS — E119 Type 2 diabetes mellitus without complications: Secondary | ICD-10-CM | POA: Diagnosis present

## 2014-02-02 DIAGNOSIS — R2242 Localized swelling, mass and lump, left lower limb: Secondary | ICD-10-CM | POA: Diagnosis present

## 2014-02-02 DIAGNOSIS — N179 Acute kidney failure, unspecified: Principal | ICD-10-CM | POA: Diagnosis present

## 2014-02-02 DIAGNOSIS — Z79899 Other long term (current) drug therapy: Secondary | ICD-10-CM

## 2014-02-02 DIAGNOSIS — L03319 Cellulitis of trunk, unspecified: Secondary | ICD-10-CM

## 2014-02-02 DIAGNOSIS — Z9221 Personal history of antineoplastic chemotherapy: Secondary | ICD-10-CM

## 2014-02-02 DIAGNOSIS — D6481 Anemia due to antineoplastic chemotherapy: Secondary | ICD-10-CM | POA: Diagnosis present

## 2014-02-02 DIAGNOSIS — T82598A Other mechanical complication of other cardiac and vascular devices and implants, initial encounter: Secondary | ICD-10-CM | POA: Diagnosis present

## 2014-02-02 DIAGNOSIS — C8590 Non-Hodgkin lymphoma, unspecified, unspecified site: Secondary | ICD-10-CM | POA: Diagnosis present

## 2014-02-02 DIAGNOSIS — E871 Hypo-osmolality and hyponatremia: Secondary | ICD-10-CM | POA: Diagnosis present

## 2014-02-02 DIAGNOSIS — R229 Localized swelling, mass and lump, unspecified: Secondary | ICD-10-CM

## 2014-02-02 DIAGNOSIS — D638 Anemia in other chronic diseases classified elsewhere: Secondary | ICD-10-CM | POA: Diagnosis present

## 2014-02-02 DIAGNOSIS — G4733 Obstructive sleep apnea (adult) (pediatric): Secondary | ICD-10-CM | POA: Diagnosis present

## 2014-02-02 DIAGNOSIS — Y849 Medical procedure, unspecified as the cause of abnormal reaction of the patient, or of later complication, without mention of misadventure at the time of the procedure: Secondary | ICD-10-CM | POA: Diagnosis present

## 2014-02-02 DIAGNOSIS — C8589 Other specified types of non-Hodgkin lymphoma, extranodal and solid organ sites: Secondary | ICD-10-CM | POA: Diagnosis present

## 2014-02-02 DIAGNOSIS — T451X5A Adverse effect of antineoplastic and immunosuppressive drugs, initial encounter: Secondary | ICD-10-CM | POA: Diagnosis present

## 2014-02-02 LAB — URINALYSIS, ROUTINE W REFLEX MICROSCOPIC
BILIRUBIN URINE: NEGATIVE
GLUCOSE, UA: NEGATIVE mg/dL
Hgb urine dipstick: NEGATIVE
Ketones, ur: NEGATIVE mg/dL
Nitrite: NEGATIVE
PROTEIN: 30 mg/dL — AB
Specific Gravity, Urine: 1.016 (ref 1.005–1.030)
Urobilinogen, UA: 0.2 mg/dL (ref 0.0–1.0)
pH: 5 (ref 5.0–8.0)

## 2014-02-02 LAB — BASIC METABOLIC PANEL
BUN: 105 mg/dL — AB (ref 6–23)
CO2: 19 mEq/L (ref 19–32)
CREATININE: 5.42 mg/dL — AB (ref 0.50–1.35)
Calcium: 10.4 mg/dL (ref 8.4–10.5)
Chloride: 87 mEq/L — ABNORMAL LOW (ref 96–112)
GFR calc Af Amer: 12 mL/min — ABNORMAL LOW (ref >90)
GFR, EST NON AFRICAN AMERICAN: 11 mL/min — AB (ref >90)
GLUCOSE: 186 mg/dL — AB (ref 70–99)
Potassium: 6.1 mEq/L — ABNORMAL HIGH (ref 3.7–5.3)
SODIUM: 128 meq/L — AB (ref 137–147)

## 2014-02-02 LAB — GLUCOSE, CAPILLARY: GLUCOSE-CAPILLARY: 104 mg/dL — AB (ref 70–99)

## 2014-02-02 LAB — URINE MICROSCOPIC-ADD ON

## 2014-02-02 LAB — CBC
HEMATOCRIT: 24.1 % — AB (ref 39.0–52.0)
HEMOGLOBIN: 7.9 g/dL — AB (ref 13.0–17.0)
MCH: 27.9 pg (ref 26.0–34.0)
MCHC: 32.8 g/dL (ref 30.0–36.0)
MCV: 85.2 fL (ref 78.0–100.0)
Platelets: 301 10*3/uL (ref 150–400)
RBC: 2.83 MIL/uL — ABNORMAL LOW (ref 4.22–5.81)
RDW: 16.6 % — ABNORMAL HIGH (ref 11.5–15.5)
WBC: 9.7 10*3/uL (ref 4.0–10.5)

## 2014-02-02 LAB — URIC ACID: Uric Acid, Serum: 12.9 mg/dL — ABNORMAL HIGH (ref 4.0–7.8)

## 2014-02-02 MED ORDER — OXYCODONE-ACETAMINOPHEN 10-325 MG PO TABS: 1.0000 | ORAL_TABLET | Freq: Four times a day (QID) | ORAL | Status: DC | PRN

## 2014-02-02 MED ORDER — DEXTROSE 50 % IV SOLN
1.0000 | Freq: Once | INTRAVENOUS | Status: AC
  Administered 2014-02-02: 50 mL via INTRAVENOUS
  Filled 2014-02-02: qty 50

## 2014-02-02 MED ORDER — FERROUS SULFATE 325 (65 FE) MG PO TABS
325.0000 mg | ORAL_TABLET | Freq: Two times a day (BID) | ORAL | Status: DC
  Administered 2014-02-03 – 2014-02-05 (×6): 325 mg via ORAL
  Filled 2014-02-02 (×8): qty 1

## 2014-02-02 MED ORDER — SODIUM POLYSTYRENE SULFONATE 15 GM/60ML PO SUSP
45.0000 g | Freq: Once | ORAL | Status: AC
  Administered 2014-02-02: 45 g via ORAL
  Filled 2014-02-02: qty 180

## 2014-02-02 MED ORDER — ATORVASTATIN CALCIUM 20 MG PO TABS
20.0000 mg | ORAL_TABLET | Freq: Every day | ORAL | Status: DC
  Administered 2014-02-03 – 2014-02-05 (×3): 20 mg via ORAL
  Filled 2014-02-02 (×3): qty 1

## 2014-02-02 MED ORDER — INSULIN ASPART 100 UNIT/ML ~~LOC~~ SOLN
0.0000 [IU] | Freq: Three times a day (TID) | SUBCUTANEOUS | Status: DC
  Administered 2014-02-03 (×2): 2 [IU] via SUBCUTANEOUS
  Administered 2014-02-04: 3 [IU] via SUBCUTANEOUS
  Administered 2014-02-04 – 2014-02-05 (×5): 2 [IU] via SUBCUTANEOUS

## 2014-02-02 MED ORDER — DOCUSATE SODIUM 100 MG PO CAPS
100.0000 mg | ORAL_CAPSULE | Freq: Two times a day (BID) | ORAL | Status: DC
  Administered 2014-02-02 – 2014-02-05 (×6): 100 mg via ORAL
  Filled 2014-02-02 (×7): qty 1

## 2014-02-02 MED ORDER — MORPHINE SULFATE 2 MG/ML IJ SOLN: 2.0000 mg | INTRAMUSCULAR | Status: DC | PRN

## 2014-02-02 MED ORDER — OXYCODONE HCL 5 MG PO TABS
5.0000 mg | ORAL_TABLET | Freq: Four times a day (QID) | ORAL | Status: DC | PRN
  Administered 2014-02-02 – 2014-02-04 (×2): 5 mg via ORAL
  Filled 2014-02-02 (×3): qty 1

## 2014-02-02 MED ORDER — SODIUM CHLORIDE 0.9 % IJ SOLN
3.0000 mL | Freq: Two times a day (BID) | INTRAMUSCULAR | Status: DC
  Administered 2014-02-02 – 2014-02-05 (×3): 3 mL via INTRAVENOUS

## 2014-02-02 MED ORDER — ACETAMINOPHEN 650 MG RE SUPP: 650.0000 mg | Freq: Four times a day (QID) | RECTAL | Status: DC | PRN

## 2014-02-02 MED ORDER — POLYETHYLENE GLYCOL 3350 17 G PO PACK
17.0000 g | PACK | Freq: Every day | ORAL | Status: DC | PRN
  Filled 2014-02-02: qty 1

## 2014-02-02 MED ORDER — ACETAMINOPHEN 325 MG PO TABS: 650.0000 mg | ORAL_TABLET | Freq: Four times a day (QID) | ORAL | Status: DC | PRN

## 2014-02-02 MED ORDER — SODIUM CHLORIDE 0.9 % IV SOLN
1.0000 g | Freq: Once | INTRAVENOUS | Status: AC
  Administered 2014-02-02: 1 g via INTRAVENOUS
  Filled 2014-02-02: qty 10

## 2014-02-02 MED ORDER — PANTOPRAZOLE SODIUM 40 MG PO TBEC
40.0000 mg | DELAYED_RELEASE_TABLET | Freq: Every day | ORAL | Status: DC
  Administered 2014-02-03 – 2014-02-05 (×3): 40 mg via ORAL
  Filled 2014-02-02 (×3): qty 1

## 2014-02-02 MED ORDER — ONDANSETRON HCL 4 MG PO TABS: 4.0000 mg | ORAL_TABLET | Freq: Four times a day (QID) | ORAL | Status: DC | PRN

## 2014-02-02 MED ORDER — OXYCODONE-ACETAMINOPHEN 5-325 MG PO TABS
1.0000 | ORAL_TABLET | Freq: Four times a day (QID) | ORAL | Status: DC | PRN
  Administered 2014-02-04: 1 via ORAL
  Filled 2014-02-02: qty 1

## 2014-02-02 MED ORDER — DOXYCYCLINE HYCLATE 100 MG PO TABS
100.0000 mg | ORAL_TABLET | Freq: Two times a day (BID) | ORAL | Status: DC
  Administered 2014-02-02 – 2014-02-05 (×7): 100 mg via ORAL
  Filled 2014-02-02 (×8): qty 1

## 2014-02-02 MED ORDER — ENOXAPARIN SODIUM 40 MG/0.4ML ~~LOC~~ SOLN
40.0000 mg | SUBCUTANEOUS | Status: DC
  Administered 2014-02-02: 40 mg via SUBCUTANEOUS
  Filled 2014-02-02 (×2): qty 0.4

## 2014-02-02 MED ORDER — SODIUM CHLORIDE 0.9 % IV SOLN
INTRAVENOUS | Status: DC
  Administered 2014-02-02 – 2014-02-03 (×2): via INTRAVENOUS

## 2014-02-02 MED ORDER — ONDANSETRON HCL 4 MG/2ML IJ SOLN: 4.0000 mg | Freq: Four times a day (QID) | INTRAMUSCULAR | Status: DC | PRN

## 2014-02-02 MED ORDER — SODIUM CHLORIDE 0.9 % IV BOLUS (SEPSIS)
1000.0000 mL | Freq: Once | INTRAVENOUS | Status: AC
  Administered 2014-02-02: 1000 mL via INTRAVENOUS

## 2014-02-02 MED ORDER — HYDRALAZINE HCL 20 MG/ML IJ SOLN
10.0000 mg | Freq: Four times a day (QID) | INTRAMUSCULAR | Status: DC | PRN
  Filled 2014-02-02: qty 0.5

## 2014-02-02 MED ORDER — INSULIN ASPART 100 UNIT/ML ~~LOC~~ SOLN
10.0000 [IU] | Freq: Once | SUBCUTANEOUS | Status: AC
  Administered 2014-02-02: 10 [IU] via INTRAVENOUS
  Filled 2014-02-02: qty 1

## 2014-02-02 NOTE — Telephone Encounter (Signed)
Call from [facility] who reported  Repeat K+ today is 6.8. (Yesterday it was 5.9)  .other labs today reported as: hgb 7.9, na+ 130, Cl-90 BUN 101, creatinine 5.4 Per Adrena johnson i instructed pt to got to ED. He voices understanding.

## 2014-02-02 NOTE — ED Notes (Signed)
Pt states he is here because his home health nurse drew his blood yesterday and his K was 5.9.  Redrew it today and it was 6.8.  Sent here for eval.  Pt c/o fatigue.

## 2014-02-02 NOTE — Progress Notes (Signed)
CPAP set up for patient. Large mask. Settings of 11cm with humidity. Patient instructed on how to start and stop if needed and also to notify his nurse to call respiratory if any problems arose. Patient tolerating well at this time.

## 2014-02-02 NOTE — Progress Notes (Signed)
   CARE MANAGEMENT ED NOTE 02/02/2014  Patient:  Douglas [name],Douglas [name]   Account Number:  [id number]  Date Initiated:  02/02/2014  Documentation initiated by:  Livia Snellen  Subjective/Objective Assessment:   patient presents to Ed with elevated potassium level and fatigue.     Subjective/Objective Assessment Detail:   Patient with pmhx of Non Hodgkin's lymphoma.  Labs: NA 128, K6.1 H&H 7.9 & 24.1 BUN 105 and Creat 5.42.     Action/Plan:   Action/Plan Detail:   Patent to be admitted   Anticipated DC Date:       Status Recommendation to Physician:   Result of Recommendation:    Other ED Services  Consult Working [place]  Other    Choice offered to / List presented to:            Status of service:  Completed, signed off  ED Comments:   ED Comments Detail:  EDCM spoke to patient at bedside.  Patient confirms he lies at home at his girlfriend's house.  Patient confirms he receives home health services from [place]. Patient reports, "I have someone who comes out and takes my blood and someone who cahnges my dressings on my leg.  It has packing."  Patient has a picc line in right upper arm. Patient reports, "It gave the nurse a little trouble today."  Patient thinks they placed the PICC line in February, but he is unsure.  Patient reports he has gotten chemotherapy through the PICC line.  He reports it is very difficult to get IV access in him.  Patient reports he gets up and walks around "okay."  He reports he sometimes needs assistance dressing, especially his shoes as per patient. Patient reports his girlfriend assists him with dressing. Patient has a cane and a CPAP machine at home.  Patient reports a good number to call in case of an emergency is his sister [name] [phone].  Patient thankful for support provided by [facility].  EDCM placed text to Erasmo Downer, transition care specialist fo [facility] to make her aware of patient's admission.  No further EDCM needs  at this time.

## 2014-02-02 NOTE — ED Provider Notes (Signed)
CSN: [id number]     Arrival date & time 02/02/14  1446 History   First MD Initiated Contact with Patient 02/02/14 1517     Chief Complaint  Patient presents with  . K of 6.8      (Consider location/radiation/quality/duration/timing/severity/associated sxs/prior Treatment) HPI Pt presenting with c/o elevated potassium.  Pt has been having home health care nurse checking him at home was told his potassium was 5.9 yesterday and 6.8 this morning.  He denies having other symptoms.  He recently had groin wound I and Dd by surgery and wound is being packed.  No fever/chills,  No shortness of breath, no leg swelling.  There are no other associated systemic symptoms, there are no other alleviating or modifying factors.   Past Medical History  Diagnosis Date  . Non Hodgkin's lymphoma January 2009  . Diabetes mellitus without complication   . Hypertension   . Sleep apnea   . Lymphadenopathy, inguinal     left   Past Surgical History  Procedure Laterality Date  . Biopsy for lymphoma Left 11/02/13    groin  . Colonoscopy N/A 12/22/2013    Procedure: COLONOSCOPY;  Surgeon: Beryle Beams, MD;  Location: [facility];  Service: Endoscopy;  Laterality: N/A;  . Esophagogastroduodenoscopy N/A 12/22/2013    Procedure: ESOPHAGOGASTRODUODENOSCOPY (EGD);  Surgeon: Beryle Beams, MD;  Location: [facility];  Service: Endoscopy;  Laterality: N/A;  . Ercp N/A 12/24/2013    Procedure: UPPER ENDOSCOPY;  Surgeon: Beryle Beams, MD;  Location: [facility];  Service: Gastroenterology;  Laterality: N/A;  . Colonoscopy N/A 12/24/2013    Procedure: COLONOSCOPY;  Surgeon: Beryle Beams, MD;  Location: [facility];  Service: Gastroenterology;  Laterality: N/A;   History reviewed. No pertinent family history. History  Substance Use Topics  . Smoking status: Never Smoker   . Smokeless tobacco: Never Used  . Alcohol Use: No    Review of Systems ROS reviewed and all otherwise negative except for mentioned in  HPI    Allergies  Review of patient's allergies indicates no known allergies.  Home Medications   No current outpatient prescriptions on file. BP 110/52  Pulse 110  Temp(Src) 98.2 F (36.8 C) (Oral)  Resp 20  Ht 5\' 10"  (1.778 m)  Wt 425 lb 14.9 oz (193.2 kg)  BMI 61.11 kg/m2  SpO2 99% Vitals reviewed Physical Exam Physical Examination: General appearance - alert, obese, well appearing, and in no distress Mental status - alert, oriented to person, place, and time Eyes - no conjunctival injection, no scleral icterus Mouth - mucous membranes moist, pharynx normal without lesions Chest - clear to auscultation, no wheezes, rales or rhonchi, symmetric air entry Heart - normal rate, regular rhythm, normal S1, S2, no murmurs, rubs, clicks or gallops Abdomen - soft, nontender, nondistended, no masses or organomegaly Extremities - peripheral pulses normal, no pedal edema, no clubbing or cyanosis Skin - normal coloration and turgor, no rashes, large open wound in left groin region, no surrounding erythema, no prurulent drainage, copious clear/serous drainage  ED Course  Procedures (including critical care time) Labs Review Labs Reviewed  CBC - Abnormal; Notable for the following:    RBC 2.83 (*)    Hemoglobin 7.9 (*)    HCT 24.1 (*)    RDW 16.6 (*)    All other components within normal limits  BASIC METABOLIC PANEL - Abnormal; Notable for the following:    Sodium 128 (*)    Potassium 6.1 (*)    Chloride  87 (*)    Glucose, Bld 186 (*)    BUN 105 (*)    Creatinine, Ser 5.42 (*)    GFR calc non Af Amer 11 (*)    GFR calc Af Amer 12 (*)    All other components within normal limits  URINALYSIS, ROUTINE W REFLEX MICROSCOPIC - Abnormal; Notable for the following:    APPearance CLOUDY (*)    Protein, ur 30 (*)    Leukocytes, UA SMALL (*)    All other components within normal limits  URINE MICROSCOPIC-ADD ON - Abnormal; Notable for the following:    Squamous Epithelial / LPF MANY  (*)    Bacteria, UA MANY (*)    Casts HYALINE CASTS (*)    All other components within normal limits  BASIC METABOLIC PANEL - Abnormal; Notable for the following:    Sodium 130 (*)    Potassium 5.7 (*)    Chloride 90 (*)    CO2 18 (*)    Glucose, Bld 167 (*)    BUN 101 (*)    Creatinine, Ser 4.86 (*)    GFR calc non Af Amer 12 (*)    GFR calc Af Amer 14 (*)    All other components within normal limits  CBC - Abnormal; Notable for the following:    RBC 2.81 (*)    Hemoglobin 7.8 (*)    HCT 23.9 (*)    RDW 16.8 (*)    All other components within normal limits  URIC ACID - Abnormal; Notable for the following:    Uric Acid, Serum 12.9 (*)    All other components within normal limits  GLUCOSE, CAPILLARY - Abnormal; Notable for the following:    Glucose-Capillary 104 (*)    All other components within normal limits  BASIC METABOLIC PANEL - Abnormal; Notable for the following:    Sodium 133 (*)    Potassium 5.5 (*)    Chloride 94 (*)    Glucose, Bld 147 (*)    BUN 99 (*)    Creatinine, Ser 4.09 (*)    GFR calc non Af Amer 15 (*)    GFR calc Af Amer 17 (*)    All other components within normal limits  CBC - Abnormal; Notable for the following:    RBC 2.63 (*)    Hemoglobin 7.2 (*)    HCT 22.7 (*)    RDW 16.6 (*)    All other components within normal limits  CULTURE, ROUTINE-ABSCESS  CBC  BASIC METABOLIC PANEL  TYPE AND SCREEN   Imaging Review Dg Chest 1 View  02/03/2014   CLINICAL DATA:  Central catheter placement  EXAM: CHEST - 1 VIEW  COMPARISON:  Chest CT December 02, 2013  FINDINGS: Central catheter tip is in the right axillary vein region. There is again noted a 1.7 x 1.3 cm nodular opacity in the right midlung. There is no edema or consolidation. Heart is mildly enlarged with normal pulmonary vascularity. No apparent adenopathy. No pneumothorax. No bone lesions.  IMPRESSION: Central catheter tip in right axillary vein. Nodular opacity right mid lung. No edema or  consolidation.   Electronically Signed   By: Lowella Grip M.D.   On: 02/03/2014 07:06   US Renal  02/03/2014   CLINICAL DATA:  Acute kidney injury.  EXAM: RENAL/URINARY TRACT ULTRASOUND COMPLETE  COMPARISON:  CT ABD/PELVIS W CM dated 10/30/2013  FINDINGS: Right Kidney:  Length: 11.6 cm. Echogenicity within normal limits. No mass or hydronephrosis visualized.  Left Kidney:  Length: 11.8 cm. Echogenicity within normal limits. No mass or hydronephrosis visualized.  Bladder:  Appears normal for degree of bladder distention.  IMPRESSION: Unremarkable study.   Electronically Signed   By: Rolm Baptise M.D.   On: 02/03/2014 16:56   CRITICAL CARE Performed by: Threasa Beards Total critical care time: 40, hyperkalemia with EKG changes- treated with insulin, d50, calcium gluconate, kayexalate Critical care time was exclusive of separately billable procedures and treating other patients. Critical care was necessary to treat or prevent imminent or life-threatening deterioration. Critical care was time spent personally by me on the following activities: development of treatment plan with patient and/or surrogate as well as nursing, discussions with consultants, evaluation of patient's response to treatment, examination of patient, obtaining history from patient or surrogate, ordering and performing treatments and interventions, ordering and review of laboratory studies, ordering and review of radiographic studies, pulse oximetry and re-evaluation of patient's condition.  EKG Interpretation None      Date: 02/02/2014  Rate: 116  Rhythm: sinus tachycardia  QRS Axis: left  Intervals: normal  ST/T Wave abnormalities: nonspecific ST/T changes, peaked t waves  Conduction Disutrbances:none  Narrative Interpretation:   Old EKG Reviewed: none available  EKg not available in EPIC for interpretation in muse  MDM   Final diagnoses:  AKI (acute kidney injury)  Anemia of chronic disease  Diabetes mellitus   Dyslipidemia  HTN (hypertension)  Mass of left thigh  Morbid obesity  NHL (non-Hodgkin's lymphoma)  Hyperkalemia    Pt presenting with elevated potassium. He has new onset renal failure, creat up to 5 from 0.9 2 weeks ago.  Hyperkalemia with peaked t waves treated as above.  Pt admitted to [facility] for further workup and management   Threasa Beards, MD 02/03/14 [phone]

## 2014-02-02 NOTE — Progress Notes (Signed)
Utilization Review completed.  Brysten Reister RN CM  

## 2014-02-02 NOTE — H&P (Addendum)
[facility] History and Physical  Orlander Norwood CNO:[id number] DOB: Apr 14, 1956 DOA: 02/02/2014  Referring physician:  PCP: [name], MD  Specialists: Dr. Earlie Server, oncologist  Chief Complaint: High potassium  HPI: Douglas [name] is a 58 y.o. male  With a history of non-Hodgkin's lymphoma, hypertension, diabetes mellitus that presents to the emergency department for high potassium level. Patient has been monitored by wound care at home with home health. He was told he had high potassium level and was sent to the hospital. Patient has been receiving home health services and wound care for an open wound to the left groin after her surgery conducted a biopsy for non-Hodgkin's lymphoma. Patient has been on doxycycline.  Patient currently denies any shortness of breath, chest pain, abdominal pain, dizziness. He denies any recent trauma or sick contacts. Patient recently started chemotherapy for his non-Hodgkin's lipoma it is followed by Dr. Earlie Server.  Review of Systems:  Constitutional: Denies fever, chills, diaphoresis, appetite change and fatigue.  HEENT: Denies photophobia, eye pain, redness, hearing loss, ear pain, congestion, sore throat, rhinorrhea, sneezing, mouth sores, trouble swallowing, neck pain, neck stiffness and tinnitus.   Respiratory: Denies SOB, DOE, cough, chest tightness,  and wheezing.   Cardiovascular: Denies chest pain, palpitations and leg swelling.  Gastrointestinal: Denies nausea, vomiting, abdominal pain, diarrhea, blood in stool and abdominal distention.  Genitourinary: Denies dysuria, urgency, frequency, hematuria, flank pain and difficulty urinating.  Musculoskeletal: Denies myalgias, back pain, joint swelling, arthralgias and gait problem.  Skin: Denies pallor, rash and wound.  Neurological: Denies dizziness, seizures, syncope, weakness, light-headedness, numbness and headaches.  Hematological: Denies adenopathy. Easy bruising, personal or family  bleeding history  Psychiatric/Behavioral: Denies suicidal ideation, mood changes, confusion, nervousness, sleep disturbance and agitation  Past Medical History  Diagnosis Date  . Non Hodgkin's lymphoma January 2009  . Diabetes mellitus without complication   . Hypertension   . Sleep apnea   . Lymphadenopathy, inguinal     left   Past Surgical History  Procedure Laterality Date  . Biopsy for lymphoma Left 11/02/13    groin  . Colonoscopy N/A 12/22/2013    Procedure: COLONOSCOPY;  Surgeon: Beryle Beams, MD;  Location: [facility];  Service: Endoscopy;  Laterality: N/A;  . Esophagogastroduodenoscopy N/A 12/22/2013    Procedure: ESOPHAGOGASTRODUODENOSCOPY (EGD);  Surgeon: Beryle Beams, MD;  Location: [facility];  Service: Endoscopy;  Laterality: N/A;  . Ercp N/A 12/24/2013    Procedure: UPPER ENDOSCOPY;  Surgeon: Beryle Beams, MD;  Location: [facility];  Service: Gastroenterology;  Laterality: N/A;  . Colonoscopy N/A 12/24/2013    Procedure: COLONOSCOPY;  Surgeon: Beryle Beams, MD;  Location: [facility];  Service: Gastroenterology;  Laterality: N/A;   Social History:  reports that he has never smoked. He has never used smokeless tobacco. He reports that he does not drink alcohol or use illicit drugs. Has home health services.  No Known Allergies  History reviewed. No pertinent family history.   Prior to Admission medications   Medication Sig Start Date End Date Taking? Authorizing Provider  atorvastatin (LIPITOR) 20 MG tablet Take 20 mg by mouth daily.   Yes Historical Provider, MD  docusate sodium (COLACE) 100 MG capsule Take 100 mg by mouth 2 (two) times daily.   Yes Historical Provider, MD  doxycycline (VIBRA-TABS) 100 MG tablet Take 1 tablet (100 mg total) by mouth 2 (two) times daily. 01/19/14  Yes [name], MD  Ferrous Sulfate (IRON) 325 (65 FE) MG TABS Take 325 mg by  mouth 2 (two) times daily. 12/26/13  Yes Adeline Saralyn Pilar, MD  omeprazole (PRILOSEC) 40 MG capsule Take 1  capsule (40 mg total) by mouth 2 (two) times daily. 12/26/13  Yes Adeline Saralyn Pilar, MD  oxyCODONE-acetaminophen (PERCOCET) 10-325 MG per tablet Take 1 tablet by mouth every 6 (six) hours as needed for pain. 01/12/14  Yes Adrena E Johnson, PA-C  pioglitazone-metformin (ACTOPLUS MET) 15-500 MG per tablet Take 1 tablet by mouth 2 (two) times daily with a meal.   Yes Historical Provider, MD  polyethylene glycol (MIRALAX / GLYCOLAX) packet Take 17 g by mouth daily as needed for mild constipation.    Yes Historical Provider, MD  prochlorperazine (COMPAZINE) 10 MG tablet Take 1 tablet (10 mg total) by mouth every 6 (six) hours as needed for nausea or vomiting. 12/01/13  Yes [name], MD  valsartan-hydrochlorothiazide (DIOVAN-HCT) 320-25 MG per tablet Take 1 tablet by mouth daily.   Yes Historical Provider, MD   Physical Exam: Filed Vitals:   02/02/14 1730  BP: 106/48  Pulse: 97  Temp:   Resp:      General: Well developed, well nourished, NAD, appears stated age  HEENT: NCAT, PERRLA, EOMI, Anicteic Sclera, mucous membranes moist.   Neck: Supple, no JVD  Cardiovascular: S1 S2 auscultated, tachycardic.  Respiratory: Clear to auscultation bilaterally with equal chest rise  Abdomen: Soft, obese, nontender, nondistended, + bowel sounds  Extremities: warm dry without cyanosis clubbing. Trace edema in the left lower extremity. Bandages noted on left colon.  Neuro: AAOx3, cranial nerves grossly intact. Strength 5/5 in patient's upper and lower extremities bilaterally  Skin: Without rashes exudates or nodules  Psych: Normal affect and demeanor with intact judgement and insight  Labs on Admission:  Basic Metabolic Panel:  Recent Labs Lab 02/02/14 1525  NA 128*  K 6.1*  CL 87*  CO2 19  GLUCOSE 186*  BUN 105*  CREATININE 5.42*  CALCIUM 10.4   Liver Function Tests: No results found for this basename: AST, ALT, ALKPHOS, BILITOT, PROT, ALBUMIN,  in the last 168 hours No results  found for this basename: LIPASE, AMYLASE,  in the last 168 hours No results found for this basename: AMMONIA,  in the last 168 hours CBC:  Recent Labs Lab 02/02/14 1525  WBC 9.7  HGB 7.9*  HCT 24.1*  MCV 85.2  PLT 301   Cardiac Enzymes: No results found for this basename: CKTOTAL, CKMB, CKMBINDEX, TROPONINI,  in the last 168 hours  BNP (last 3 results) No results found for this basename: PROBNP,  in the last 8760 hours CBG: No results found for this basename: GLUCAP,  in the last 168 hours  Radiological Exams on Admission: No results found.  EKG: Independently reviewed. Sinus tachycardia, rate 116, left axis deviation, peaked T waves.  Assessment/Plan  Acute kidney injury with hyponatremia and hypochloremia Patient will be admitted to telemetry floor. Multifactorial in cause, including possible dehydration versus chemotherapy versus other medications. Baseline creatinine approximately 1, currently 5.42. Will hold any nephrotoxic agents at this time, including valsartan, HCTZ, and metformin. Will continue his doxycycline, after speaking with pharmacy. Will continue to give him IV hydration and monitor his BMP. Will also obtain a urine ultrasound. Will not obtain urine electrolytes at this time due to patient being on hydrochlorothiazide. Will monitor his intake and output as well as daily weights. May consider consulting nephrology if creatinine is not improve.   Hyerkalemia Secondary to acute kidney injury. Patient will be placed on telemetry. Patient  was given Kayexalate as well as calcium gluconate followed by insulin and D50 in the emergency department. Will repeat his potassium around 2300. Currently his potassium is 6.1.  Non-Hodgkin's lymphoma Will consult Dr. Earlie Server. Patient is due for his next round of chemotherapy on 02/03/2014 with Rituxan and Bendamustine.   Left groin abscess Secondary to surgical biopsy. Will continue doxycycline at this time. Will also consult  wound care to monitor.  Hypertension Will hold valsartan and HCTZ at this time. Will on hydralazine if needed.  Diabetes mellitus Will hold metformin and pioglitazone. Will place patient on insulin sliding scale, CBG monitoring.  Last hemoglobin A1c was 7.6 on 12/20/2013.  Dyslipidemia Will continue statin.  Anemia of chronic disease Hemoglobin is 7.9, currently around his baseline of 7.5-8. Will monitor CBC. We'll continue ferrous sulfate.  Moderate obesity Will need outpatient management. DVT prophylaxis: Lovenox  Code Status: Full  Condition:  Guarded  Family Communication: None at bedside. Admission, patients condition and plan of care including tests being ordered have been discussed with the patient, who indicates understanding and agrees with the plan and Code Status.  Disposition Plan: Admitted.   Time spent: 60 minutes  Ashwin Tibbs D.O. [facility] Pager [phone]  If 7PM-7AM, please contact night-coverage [email or web address] Password [facility] 02/02/2014, 6:39 PM

## 2014-02-03 ENCOUNTER — Telehealth: Payer: Self-pay | Admitting: Internal Medicine

## 2014-02-03 ENCOUNTER — Inpatient Hospital Stay (HOSPITAL_COMMUNITY)
Admission: RE | Admit: 2014-02-03 | Discharge: 2014-02-03 | Disposition: A | Discharge status: Home / Self Care | Payer: BC Managed Care – PPO | Source: Ambulatory Visit | Attending: Internal Medicine | Admitting: Internal Medicine

## 2014-02-03 ENCOUNTER — Ambulatory Visit: Payer: BC Managed Care – PPO

## 2014-02-03 ENCOUNTER — Inpatient Hospital Stay (HOSPITAL_COMMUNITY): Payer: BC Managed Care – PPO

## 2014-02-03 ENCOUNTER — Other Ambulatory Visit: Payer: BC Managed Care – PPO

## 2014-02-03 DIAGNOSIS — C8589 Other specified types of non-Hodgkin lymphoma, extranodal and solid organ sites: Secondary | ICD-10-CM

## 2014-02-03 DIAGNOSIS — R609 Edema, unspecified: Secondary | ICD-10-CM

## 2014-02-03 DIAGNOSIS — L03319 Cellulitis of trunk, unspecified: Secondary | ICD-10-CM

## 2014-02-03 DIAGNOSIS — E875 Hyperkalemia: Secondary | ICD-10-CM

## 2014-02-03 DIAGNOSIS — R599 Enlarged lymph nodes, unspecified: Secondary | ICD-10-CM

## 2014-02-03 DIAGNOSIS — N179 Acute kidney failure, unspecified: Principal | ICD-10-CM

## 2014-02-03 DIAGNOSIS — Z452 Encounter for adjustment and management of vascular access device: Secondary | ICD-10-CM

## 2014-02-03 DIAGNOSIS — L02219 Cutaneous abscess of trunk, unspecified: Secondary | ICD-10-CM

## 2014-02-03 LAB — BASIC METABOLIC PANEL
BUN: 101 mg/dL — AB (ref 6–23)
BUN: 99 mg/dL — ABNORMAL HIGH (ref 6–23)
CALCIUM: 10 mg/dL (ref 8.4–10.5)
CHLORIDE: 94 meq/L — AB (ref 96–112)
CO2: 18 mEq/L — ABNORMAL LOW (ref 19–32)
CO2: 21 meq/L (ref 19–32)
CREATININE: 4.86 mg/dL — AB (ref 0.50–1.35)
Calcium: 10.2 mg/dL (ref 8.4–10.5)
Chloride: 90 mEq/L — ABNORMAL LOW (ref 96–112)
Creatinine, Ser: 4.09 mg/dL — ABNORMAL HIGH (ref 0.50–1.35)
GFR calc Af Amer: 17 mL/min — ABNORMAL LOW (ref >90)
GFR calc non Af Amer: 12 mL/min — ABNORMAL LOW (ref >90)
GFR calc non Af Amer: 15 mL/min — ABNORMAL LOW (ref >90)
GFR, EST AFRICAN AMERICAN: 14 mL/min — AB (ref >90)
Glucose, Bld: 167 mg/dL — ABNORMAL HIGH (ref 70–99)
Glucose, Bld: 147 mg/dL — ABNORMAL HIGH (ref 70–99)
Potassium: 5.7 mEq/L — ABNORMAL HIGH (ref 3.7–5.3)
Potassium: 5.5 mEq/L — ABNORMAL HIGH (ref 3.7–5.3)
SODIUM: 133 meq/L — AB (ref 137–147)
Sodium: 130 mEq/L — ABNORMAL LOW (ref 137–147)

## 2014-02-03 LAB — CBC
HCT: 23.9 % — ABNORMAL LOW (ref 39.0–52.0)
HEMATOCRIT: 22.7 % — AB (ref 39.0–52.0)
Hemoglobin: 7.8 g/dL — ABNORMAL LOW (ref 13.0–17.0)
Hemoglobin: 7.2 g/dL — ABNORMAL LOW (ref 13.0–17.0)
MCH: 27.8 pg (ref 26.0–34.0)
MCH: 27.4 pg (ref 26.0–34.0)
MCHC: 32.6 g/dL (ref 30.0–36.0)
MCHC: 31.7 g/dL (ref 30.0–36.0)
MCV: 85.1 fL (ref 78.0–100.0)
MCV: 86.3 fL (ref 78.0–100.0)
PLATELETS: 254 10*3/uL (ref 150–400)
Platelets: 299 10*3/uL (ref 150–400)
RBC: 2.81 MIL/uL — ABNORMAL LOW (ref 4.22–5.81)
RBC: 2.63 MIL/uL — AB (ref 4.22–5.81)
RDW: 16.8 % — AB (ref 11.5–15.5)
RDW: 16.6 % — ABNORMAL HIGH (ref 11.5–15.5)
WBC: 9.1 10*3/uL (ref 4.0–10.5)
WBC: 7.8 10*3/uL (ref 4.0–10.5)

## 2014-02-03 LAB — GLUCOSE, CAPILLARY
GLUCOSE-CAPILLARY: 115 mg/dL — AB (ref 70–99)
GLUCOSE-CAPILLARY: 136 mg/dL — AB (ref 70–99)
Glucose-Capillary: 142 mg/dL — ABNORMAL HIGH (ref 70–99)
Glucose-Capillary: 157 mg/dL — ABNORMAL HIGH (ref 70–99)

## 2014-02-03 MED ORDER — SODIUM CHLORIDE 0.9 % IV SOLN
INTRAVENOUS | Status: DC
Start: 2014-02-03 — End: 2014-02-04
  Administered 2014-02-03 – 2014-02-04 (×2): via INTRAVENOUS

## 2014-02-03 MED ORDER — GI COCKTAIL ~~LOC~~
30.0000 mL | Freq: Once | ORAL | Status: AC
  Administered 2014-02-04: 30 mL via ORAL
  Filled 2014-02-03: qty 30

## 2014-02-03 MED ORDER — ALUM & MAG HYDROXIDE-SIMETH 200-200-20 MG/5ML PO SUSP: 30.0000 mL | Freq: Four times a day (QID) | ORAL | Status: DC | PRN

## 2014-02-03 MED ORDER — HEPARIN SODIUM (PORCINE) 5000 UNIT/ML IJ SOLN
5000.0000 [IU] | Freq: Three times a day (TID) | INTRAMUSCULAR | Status: DC
  Administered 2014-02-03 – 2014-02-05 (×6): 5000 [IU] via SUBCUTANEOUS
  Filled 2014-02-03 (×9): qty 1

## 2014-02-03 NOTE — Telephone Encounter (Signed)
, °

## 2014-02-03 NOTE — Progress Notes (Signed)
R PICC D?C per MD order 44cm intact. Vaseline pressure dsg applied/no bleeding. Lorri Frederick, RN

## 2014-02-03 NOTE — Progress Notes (Signed)
PROGRESS NOTE    Douglas [name] DZH:[id number] DOB: Sep 11, 1956 DOA: 02/02/2014 PCP: [name], MD  HPI/Brief narrative 58 year old male with history of NHL, DM 2, HTN, morbid obesity, nonhealing left inguinal wound after core biopsy 10/2014 and I&D 01/2014, follows at wound care center, was told to come to the hospital for hyperkalemia. He is on ARB at home.   Assessment/Plan:  1. Acute renal failure/hyperkalemia: Creatinine was normal recently.? ARB versus hemodynamics versus chemotherapy medications related. EKG without hyperkalemia changes. Nephrology consulted and input appreciated. Continue IV normal saline hydration at 100 mils an hour. Continue to hold metformin, ARB, HCTZ and Actos. Renal ultrasound negative. Trend daily BMP. Improving. 2. Nonhealing left groin wound: Status post I&D 3/ 2015. Surgical input appreciated-no intervention needed. Continue oral doxycycline. 3. Non-Hodgkin's lymphoma: Discussed with Dr. Berneta Sages chemotherapy for now. Outpatient followup. 4. Hypertension: Soft blood pressure/control. Holding above medications. When necessary IV hydralazine. 5. Type II DM: Hold oral medications. Last A1c: 7.6 on 12/20/13. SSI. 6. Dyslipidemia: Continue statins. 7. Anemia: Likely multifactorial from chronic disease and chemotherapy. Follow daily CBCs and transfuse for hemoglobin less than 7 g per DL. 8. Obesity: 9. Nonfunctioning PICC line: DC PICC line and do not insert new one for now. Patient has peripheral IV access. 10. OSA: Continue nightly CPAP   Code Status: Full Family Communication: Discussed with patient Disposition Plan: Home when medically stable   Consultants:  General surgery  Oncology  Nephrology  Procedures:  None  Antibiotics:  Doxycycline   Subjective: Patient states that he did not sleep well at night and feels sleepy this morning. Several BMs last night.  Objective: Filed Vitals:   02/02/14 1945 02/02/14 1959 02/02/14  2330 02/03/14 0503  BP: 113/46   93/43  Pulse: 109  101 103  Temp: 98.2 F (36.8 C)   98.3 F (36.8 C)  TempSrc: Oral   Oral  Resp: 20  20 18   Height: 5\' 10"  (1.778 m) 5\' 10"  (1.778 m)    Weight: 193.232 kg (426 lb) 193.2 kg (425 lb 14.9 oz)  193.2 kg (425 lb 14.9 oz)  SpO2: 100%  100% 100%    Intake/Output Summary (Last 24 hours) at 02/03/14 1700 Last data filed at 02/03/14 1500  Gross per 24 hour  Intake 2043.33 ml  Output    600 ml  Net 1443.33 ml   Filed Weights   02/02/14 1945 02/02/14 1959 02/03/14 0503  Weight: 193.232 kg (426 lb) 193.2 kg (425 lb 14.9 oz) 193.2 kg (425 lb 14.9 oz)     Exam:  General exam: Morbidly obese male lying comfortably in bed. Respiratory system: Clear. No increased work of breathing. Cardiovascular system: S1 & S2 heard, RRR. No JVD, murmurs, gallops, clicks or pedal edema. Telemetry: Sinus rhythm-sinus tachycardia in the 100s. Gastrointestinal system: Abdomen is nondistended, soft and nontender. Normal bowel sounds heard. Large lymph node in the left groin with overlying large open wound with pink granulation tissue in the base and not actively bleeding & no acute signs. Central nervous system: Alert and oriented. No focal neurological deficits. Extremities: Symmetric 5 x 5 power.   Data Reviewed: Basic Metabolic Panel:  Recent Labs Lab 02/02/14 1525 02/03/14 0119 02/03/14 1043  NA 128* 130* 133*  K 6.1* 5.7* 5.5*  CL 87* 90* 94*  CO2 19 18* 21  GLUCOSE 186* 167* 147*  BUN 105* 101* 99*  CREATININE 5.42* 4.86* 4.09*  CALCIUM 10.4 10.2 10.0   Liver Function Tests: No results found for  this basename: AST, ALT, ALKPHOS, BILITOT, PROT, ALBUMIN,  in the last 168 hours No results found for this basename: LIPASE, AMYLASE,  in the last 168 hours No results found for this basename: AMMONIA,  in the last 168 hours CBC:  Recent Labs Lab 02/02/14 1525 02/03/14 0119 02/03/14 1043  WBC 9.7 9.1 7.8  HGB 7.9* 7.8* 7.2*  HCT 24.1*  23.9* 22.7*  MCV 85.2 85.1 86.3  PLT 301 299 254   Cardiac Enzymes: No results found for this basename: CKTOTAL, CKMB, CKMBINDEX, TROPONINI,  in the last 168 hours BNP (last 3 results) No results found for this basename: PROBNP,  in the last 8760 hours CBG:  Recent Labs Lab 02/02/14 2058 02/03/14 0805 02/03/14 1225  GLUCAP 104* 115* 142*    No results found for this or any previous visit (from the past 240 hour(s)).    Studies: Dg Chest 1 View  02/03/2014   CLINICAL DATA:  Central catheter placement  EXAM: CHEST - 1 VIEW  COMPARISON:  Chest CT December 02, 2013  FINDINGS: Central catheter tip is in the right axillary vein region. There is again noted a 1.7 x 1.3 cm nodular opacity in the right midlung. There is no edema or consolidation. Heart is mildly enlarged with normal pulmonary vascularity. No apparent adenopathy. No pneumothorax. No bone lesions.  IMPRESSION: Central catheter tip in right axillary vein. Nodular opacity right mid lung. No edema or consolidation.   Electronically Signed   By: Lowella Grip M.D.   On: 02/03/2014 07:06   US Renal  02/03/2014   CLINICAL DATA:  Acute kidney injury.  EXAM: RENAL/URINARY TRACT ULTRASOUND COMPLETE  COMPARISON:  CT ABD/PELVIS W CM dated 10/30/2013  FINDINGS: Right Kidney:  Length: 11.6 cm. Echogenicity within normal limits. No mass or hydronephrosis visualized.  Left Kidney:  Length: 11.8 cm. Echogenicity within normal limits. No mass or hydronephrosis visualized.  Bladder:  Appears normal for degree of bladder distention.  IMPRESSION: Unremarkable study.   Electronically Signed   By: Rolm Baptise M.D.   On: 02/03/2014 16:56        Scheduled Meds: . atorvastatin  20 mg Oral Daily  . docusate sodium  100 mg Oral BID  . doxycycline  100 mg Oral BID AC  . enoxaparin (LOVENOX) injection  40 mg Subcutaneous Q24H  . ferrous sulfate  325 mg Oral BID PC  . insulin aspart  0-15 Units Subcutaneous TID WC  . pantoprazole  40 mg Oral  Daily  . sodium chloride  3 mL Intravenous Q12H   Continuous Infusions: . sodium chloride 100 mL/hr at 02/03/14 5102    Principal Problem:   AKI (acute kidney injury) Active Problems:   NHL (non-Hodgkin's lymphoma)   Mass of left thigh   HTN (hypertension)   Dyslipidemia   Anemia of chronic disease   Morbid obesity   Diabetes mellitus   Cellulitis and abscess of left groin   Hyperkalemia    Time spent: 26 minutes    Daleah Coulson, MD, FACP, FHM. [facility] Pager [phone]  If 7PM-7AM, please contact night-coverage [email or web address] Password [email or web address] 02/03/2014, 5:00 PM    LOS: 1 day

## 2014-02-03 NOTE — Progress Notes (Signed)
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   PRIOR THERAPY: None. He was followed by observation by his previous oncologist.   CURRENT THERAPY: Systemic chemotherapy with Bendamustine 90 mg/M2 on days 1 and 2 in addition to Rituxan 375 mg/M2 on day 1 every 4 weeks. First dose 12/08/2013. Status post 2 cycles  Subjective: The patient is seen and examined today. His wife was at the bedside. He was admitted yesterday with hyperkalemia and acute renal failure. He was started on IV hydration and feeling a little bit better today. The patient was found recently to have large left groin abscess secondary to a necrotic large lymph node that wasn't responding to chemotherapy. He has an incision and drainage under the care of Dr. Zella Richer. He is also undergoing treatment with doxycycline. He has poor by mouth intake recently and was dehydrated. He denied having any fever or chills. He denied having any chest pain, shortness breath, cough or hemoptysis. Renal ultrasound performed today showed unremarkable study.  Objective: Vital signs in last 24 hours: Temp:  [98.2 F (36.8 C)-98.3 F (36.8 C)] 98.3 F (36.8 C) (03/18 0503) Pulse Rate:  [97-109] 103 (03/18 0503) Resp:  [18-20] 18 (03/18 0503) BP: (93-113)/(43-48) 93/43 mmHg (03/18 0503) SpO2:  [100 %] 100 % (03/18 0503) Weight:  [425 lb 14.9 oz (193.2 kg)-426 lb (193.232 kg)] 425 lb 14.9 oz (193.2 kg) (03/18 0503)  Intake/Output from previous day: 03/17 0701 - 03/18 0700 In: 1243.3 [P.O.:480; I.V.:763.3] Out: 250 [Urine:250] Intake/Output this shift: Total I/O In: 800 [I.V.:800] Out: 350 [Urine:350]  General appearance: alert, cooperative, fatigued and no distress Resp: clear to auscultation bilaterally Cardio: regular rate and rhythm, S1, S2 normal, no murmur, click, rub or gallop GI: soft,  non-tender; bowel sounds normal; no masses,  no organomegaly Extremities: edema 2+  Lab Results:   Recent Labs  02/03/14 0119 02/03/14 1043  WBC 9.1 7.8  HGB 7.8* 7.2*  HCT 23.9* 22.7*  PLT 299 254   BMET  Recent Labs  02/03/14 0119 02/03/14 1043  NA 130* 133*  K 5.7* 5.5*  CL 90* 94*  CO2 18* 21  GLUCOSE 167* 147*  BUN 101* 99*  CREATININE 4.86* 4.09*  CALCIUM 10.2 10.0    Studies/Results: Dg Chest 1 View  02/03/2014   CLINICAL DATA:  Central catheter placement  EXAM: CHEST - 1 VIEW  COMPARISON:  Chest CT December 02, 2013  FINDINGS: Central catheter tip is in the right axillary vein region. There is again noted a 1.7 x 1.3 cm nodular opacity in the right midlung. There is no edema or consolidation. Heart is mildly enlarged with normal pulmonary vascularity. No apparent adenopathy. No pneumothorax. No bone lesions.  IMPRESSION: Central catheter tip in right axillary vein. Nodular opacity right mid lung. No edema or consolidation.   Electronically Signed   By: Lowella Grip M.D.   On: 02/03/2014 07:06    Medications: I have reviewed the patient's current medications.  CODE STATUS: Full code  Assessment/Plan: 1) follicular low-grade non-Hodgkin lymphoma with large left groin lymphadenopathy: status post 2 cycles of systemic chemotherapy with bendamustine and Rituxan. The patient has significant improvement in his lymphadenopathy with decrease in the edema of the left lower extremity. The large left inguinal lymph node became necrotic and transformed into a large abscess which was drained under the care of Dr. Zella Richer.  His chemotherapy is currently  on hold until improvement in his condition spatially the renal dysfunction and the left inguinal abscess. 2) acute renal failure: unclear etiology but most likely secondary to poor by mouth intake and dehydration. Continue IV fluid. Consult nephrology for evaluation. 3) Hyperkalemia: secondary to acute renal failure and  antihypertensive medication. Improving. Thank you so much for taking good care of Douglas [name]. I will continue to follow up the patient with you and assist in his management an as-needed basis.   LOS: 1 day    Ernestyne Caldwell K. 02/03/2014

## 2014-02-03 NOTE — Consult Note (Signed)
Reason for Consult:  Left groin draining nodular wound. Referring Physician: Dr. Vernell [name]  Douglas [name] is an 58 y.o. male.  HPI: This is a 56 y/o with non hodgkins lymphoma who underwent a core biopsy last year by IR 11/02/13.  He has been on chemotherapy for his lymphoma.  The site from the biopsy never really healed and started draining fluid with some skin necrosis back in the end of February or early March. He saw Dr. Zella [name] in the office on 01/19/14 and her performed an I&D of the site.  He has been on wet to dry dressing since.  His last office visit was 01/28/14.  He was showing slow improvement.  He is followed by home health and was admitted with findings of an elevated K+ 6.1 and a creatinine of 5.42.  We are ask to see to reevaluate his open groin wound and discuss further rx.Douglas [name] He remains on chemotherapy and his last treatment was 01/05/14.   Past Medical History  Diagnosis Date  Non Hodgkin's lymphoma January 2009  Diabetes mellitus without complication   Hypertension   Sleep apnea   Lymphadenopathy, inguinal left     Body mass index is 61     Past Surgical History  Procedure Laterality Date  . Biopsy for lymphoma Left 11/02/13    groin  . Colonoscopy N/A 12/22/2013    Procedure: COLONOSCOPY;  Surgeon: Douglas Beams, MD;  Location: [facility];  Service: Endoscopy;  Laterality: N/A;  . Esophagogastroduodenoscopy N/A 12/22/2013    Procedure: ESOPHAGOGASTRODUODENOSCOPY (EGD);  Surgeon: Douglas Beams, MD;  Location: [facility];  Service: Endoscopy;  Laterality: N/A;  . Ercp N/A 12/24/2013    Procedure: UPPER ENDOSCOPY;  Surgeon: Douglas Beams, MD;  Location: [facility];  Service: Gastroenterology;  Laterality: N/A;  . Colonoscopy N/A 12/24/2013    Procedure: COLONOSCOPY;  Surgeon: Douglas Beams, MD;  Location: [facility];  Service: Gastroenterology;  Laterality: N/A;    History reviewed. No pertinent family history.  Social History:  reports that he has never smoked.  He has never used smokeless tobacco. He reports that he does not drink alcohol or use illicit drugs.  Allergies: No Known Allergies  Medications:  Prior to Admission:  Prescriptions prior to admission  Medication Sig Dispense Refill  . atorvastatin (LIPITOR) 20 MG tablet Take 20 mg by mouth daily.      Douglas [name] docusate sodium (COLACE) 100 MG capsule Take 100 mg by mouth 2 (two) times daily.      Douglas [name] doxycycline (VIBRA-TABS) 100 MG tablet Take 1 tablet (100 mg total) by mouth 2 (two) times daily.  42 tablet  0  . Ferrous Sulfate (IRON) 325 (65 FE) MG TABS Take 325 mg by mouth 2 (two) times daily.  60 each  0  . omeprazole (PRILOSEC) 40 MG capsule Take 1 capsule (40 mg total) by mouth 2 (two) times daily.  60 capsule  0  . oxyCODONE-acetaminophen (PERCOCET) 10-325 MG per tablet Take 1 tablet by mouth every 6 (six) hours as needed for pain.  60 tablet  0  . pioglitazone-metformin (ACTOPLUS MET) 15-500 MG per tablet Take 1 tablet by mouth 2 (two) times daily with a meal.      . polyethylene glycol (MIRALAX / GLYCOLAX) packet Take 17 g by mouth daily as needed for mild constipation.       . prochlorperazine (COMPAZINE) 10 MG tablet Take 1 tablet (10 mg total) by mouth every 6 (six) hours as needed for  nausea or vomiting.  60 tablet  0  . valsartan-hydrochlorothiazide (DIOVAN-HCT) 320-25 MG per tablet Take 1 tablet by mouth daily.       Scheduled: . atorvastatin  20 mg Oral Daily  . docusate sodium  100 mg Oral BID  . doxycycline  100 mg Oral BID AC  . enoxaparin (LOVENOX) injection  40 mg Subcutaneous Q24H  . ferrous sulfate  325 mg Oral BID PC  . insulin aspart  0-15 Units Subcutaneous TID WC  . pantoprazole  40 mg Oral Daily  . sodium chloride  3 mL Intravenous Q12H   Continuous: . sodium chloride 100 mL/hr at 02/03/14 0846   [name]:[id number], acetaminophen, hydrALAZINE, morphine injection, ondansetron (ZOFRAN) IV, ondansetron, oxyCODONE, oxyCODONE-acetaminophen, polyethylene  glycol Anti-infectives   Start     Dose/Rate Route Frequency Ordered Stop   02/02/14 2030  doxycycline (VIBRA-TABS) tablet 100 mg     100 mg Oral 2 times daily before meals 02/02/14 1955        Results for orders placed during the hospital encounter of 02/02/14 (from the past 48 hour(s))  CBC     Status: Abnormal   Collection Time    02/02/14  3:25 PM      Result Value Ref Range   WBC 9.7  4.0 - 10.5 K/uL   RBC 2.83 (*) 4.22 - 5.81 MIL/uL   Hemoglobin 7.9 (*) 13.0 - 17.0 g/dL   HCT 24.1 (*) 39.0 - 52.0 %   MCV 85.2  78.0 - 100.0 fL   MCH 27.9  26.0 - 34.0 pg   MCHC 32.8  30.0 - 36.0 g/dL   RDW 16.6 (*) 11.5 - 15.5 %   Platelets 301  150 - 400 K/uL  BASIC METABOLIC PANEL     Status: Abnormal   Collection Time    02/02/14  3:25 PM      Result Value Ref Range   Sodium 128 (*) 137 - 147 mEq/L   Potassium 6.1 (*) 3.7 - 5.3 mEq/L   Chloride 87 (*) 96 - 112 mEq/L   CO2 19  19 - 32 mEq/L   Glucose, Bld 186 (*) 70 - 99 mg/dL   BUN 105 (*) 6 - 23 mg/dL   Creatinine, Ser 5.42 (*) 0.50 - 1.35 mg/dL   Calcium 10.4  8.4 - 10.5 mg/dL   GFR calc non Af Amer 11 (*) >90 mL/min   GFR calc Af Amer 12 (*) >90 mL/min   Comment: (NOTE)     The eGFR has been calculated using the CKD EPI equation.     This calculation has not been validated in all clinical situations.     eGFR's persistently <90 mL/min signify possible Chronic Kidney     Disease.  URINALYSIS, ROUTINE W REFLEX MICROSCOPIC     Status: Abnormal   Collection Time    02/02/14  5:04 PM      Result Value Ref Range   Color, Urine YELLOW  YELLOW   APPearance CLOUDY (*) CLEAR   Specific Gravity, Urine 1.016  1.005 - 1.030   pH 5.0  5.0 - 8.0   Glucose, UA NEGATIVE  NEGATIVE mg/dL   Hgb urine dipstick NEGATIVE  NEGATIVE   Bilirubin Urine NEGATIVE  NEGATIVE   Ketones, ur NEGATIVE  NEGATIVE mg/dL   Protein, ur 30 (*) NEGATIVE mg/dL   Urobilinogen, UA 0.2  0.0 - 1.0 mg/dL   Nitrite NEGATIVE  NEGATIVE   Leukocytes, UA SMALL (*)  NEGATIVE  URINE MICROSCOPIC-ADD ON  Status: Abnormal   Collection Time    02/02/14  5:04 PM      Result Value Ref Range   Squamous Epithelial / LPF MANY (*) RARE   WBC, UA 3-6  <3 WBC/hpf   RBC / HPF 0-2  <3 RBC/hpf   Bacteria, UA MANY (*) RARE   Casts HYALINE CASTS (*) NEGATIVE   Urine-Other MUCOUS PRESENT    URIC ACID     Status: Abnormal   Collection Time    02/02/14  8:05 PM      Result Value Ref Range   Uric Acid, Serum 12.9 (*) 4.0 - 7.8 mg/dL  GLUCOSE, CAPILLARY     Status: Abnormal   Collection Time    02/02/14  8:58 PM      Result Value Ref Range   Glucose-Capillary 104 (*) 70 - 99 mg/dL  BASIC METABOLIC PANEL     Status: Abnormal   Collection Time    02/03/14  1:19 AM      Result Value Ref Range   Sodium 130 (*) 137 - 147 mEq/L   Potassium 5.7 (*) 3.7 - 5.3 mEq/L   Chloride 90 (*) 96 - 112 mEq/L   CO2 18 (*) 19 - 32 mEq/L   Glucose, Bld 167 (*) 70 - 99 mg/dL   BUN 101 (*) 6 - 23 mg/dL   Creatinine, Ser 4.86 (*) 0.50 - 1.35 mg/dL   Calcium 10.2  8.4 - 10.5 mg/dL   GFR calc non Af Amer 12 (*) >90 mL/min   GFR calc Af Amer 14 (*) >90 mL/min   Comment: (NOTE)     The eGFR has been calculated using the CKD EPI equation.     This calculation has not been validated in all clinical situations.     eGFR's persistently <90 mL/min signify possible Chronic Kidney     Disease.  CBC     Status: Abnormal   Collection Time    02/03/14  1:19 AM      Result Value Ref Range   WBC 9.1  4.0 - 10.5 K/uL   RBC 2.81 (*) 4.22 - 5.81 MIL/uL   Hemoglobin 7.8 (*) 13.0 - 17.0 g/dL   HCT 23.9 (*) 39.0 - 52.0 %   MCV 85.1  78.0 - 100.0 fL   MCH 27.8  26.0 - 34.0 pg   MCHC 32.6  30.0 - 36.0 g/dL   RDW 16.8 (*) 11.5 - 15.5 %   Platelets 299  150 - 400 K/uL  BASIC METABOLIC PANEL     Status: Abnormal   Collection Time    02/03/14 10:43 AM      Result Value Ref Range   Sodium 133 (*) 137 - 147 mEq/L   Potassium 5.5 (*) 3.7 - 5.3 mEq/L   Chloride 94 (*) 96 - 112 mEq/L   CO2 21   19 - 32 mEq/L   Glucose, Bld 147 (*) 70 - 99 mg/dL   BUN 99 (*) 6 - 23 mg/dL   Creatinine, Ser 4.09 (*) 0.50 - 1.35 mg/dL   Calcium 10.0  8.4 - 10.5 mg/dL   GFR calc non Af Amer 15 (*) >90 mL/min   GFR calc Af Amer 17 (*) >90 mL/min   Comment: (NOTE)     The eGFR has been calculated using the CKD EPI equation.     This calculation has not been validated in all clinical situations.     eGFR's persistently <90 mL/min signify possible Chronic Kidney  Disease.  CBC     Status: Abnormal   Collection Time    02/03/14 10:43 AM      Result Value Ref Range   WBC 7.8  4.0 - 10.5 K/uL   RBC 2.63 (*) 4.22 - 5.81 MIL/uL   Hemoglobin 7.2 (*) 13.0 - 17.0 g/dL   HCT 22.7 (*) 39.0 - 52.0 %   MCV 86.3  78.0 - 100.0 fL   MCH 27.4  26.0 - 34.0 pg   MCHC 31.7  30.0 - 36.0 g/dL   RDW 16.6 (*) 11.5 - 15.5 %   Platelets 254  150 - 400 K/uL    Dg Chest 1 View  02/03/2014   CLINICAL DATA:  Central catheter placement  EXAM: CHEST - 1 VIEW  COMPARISON:  Chest CT December 02, 2013  FINDINGS: Central catheter tip is in the right axillary vein region. There is again noted a 1.7 x 1.3 cm nodular opacity in the right midlung. There is no edema or consolidation. Heart is mildly enlarged with normal pulmonary vascularity. No apparent adenopathy. No pneumothorax. No bone lesions.  IMPRESSION: Central catheter tip in right axillary vein. Nodular opacity right mid lung. No edema or consolidation.   Electronically Signed   By: Lowella Grip M.D.   On: 02/03/2014 07:06    Review of Systems  All other systems reviewed and are negative.   Blood pressure 93/43, pulse 103, temperature 98.3 F (36.8 C), temperature source Oral, resp. rate 18, height 5' 10"  (1.778 m), weight 193.2 kg (425 lb 14.9 oz), SpO2 100.00%. Physical Exam  Constitutional: He is oriented to person, place, and time. He appears well-developed and well-nourished. No distress.  HENT:  Head: Normocephalic and atraumatic.  Nose: Nose normal.   Eyes: Conjunctivae and EOM are normal. Pupils are equal, round, and reactive to light. Right eye exhibits no discharge. Left eye exhibits no discharge. No scleral icterus.  Neck: Normal range of motion. Neck supple. No JVD present. No tracheal deviation present. No thyromegaly present.  Cardiovascular: Normal rate, regular rhythm, normal heart sounds and intact distal pulses.  Exam reveals no gallop.   No murmur heard. Respiratory: Effort normal and breath sounds normal. No respiratory distress. He has no wheezes. He has no rales. He exhibits no tenderness.  GI: Soft. Bowel sounds are normal. He exhibits no distension and no mass. There is no tenderness. There is no rebound and no guarding.  Musculoskeletal: He exhibits edema.  Lymphadenopathy:    He has no cervical adenopathy.  Neurological: He is alert and oriented to person, place, and time. No cranial nerve deficit.  Skin: He is not diaphoretic.     His left groin is open.  It has swelling about 25 cm x 15 cm.  Within this large swollen area is an open site about 12 x 4 cm.  This has some purulent and allot of serous drainage.  I can probe to a depth of about 6-7 cm.  See pictures below.  Psychiatric: He has a normal mood and affect. His behavior is normal. Judgment and thought content normal.     This is looking from the abdomen down.    Looking from right side into left groin.   Assessment/Plan: Open biopsy site; left groin with Non Hodgkin's lymphoma Acute renal failure Hyperkalemia AODM Hypertension Sleep Apnea BMI 61  Plan:  I have culture the site and we are already doing wet to dry dressing.  He is on Doxycycline pre and post admission.  I will review with Dr. Zella [name] and we will discuss need for IV antibiotics.  Continue wet to dry dressings.  He has a good deal of drainage from the open site and he keeps a towel over it to absorb the fluid.  Avenell Sellers 02/03/2014, 12:12 PM

## 2014-02-03 NOTE — Progress Notes (Signed)
Pt c/o indigestion. Lamar Blinks, NP text paged. awaiting return call. Will continue to monitor.

## 2014-02-03 NOTE — Progress Notes (Signed)
[facility]  Patient Status: Active (receiving services up to time of hospitalization)  [facility] is providing the following services: RN  If patient discharges after hours, please call [phone].   Lurlean Leyden 02/03/2014, 10:51 AM

## 2014-02-03 NOTE — Consult Note (Signed)
He is well known to me, he has an infected, necrotic lymph node and likely has a lymph leak.  Nothing surgically to be done at this time.  Will wait for culture results.

## 2014-02-03 NOTE — Progress Notes (Signed)
Nutrition Brief Note  Patient identified on the Malnutrition Screening Tool (MST) Report  Wt Readings from Last 15 Encounters:  02/03/14 425 lb 14.9 oz (193.2 kg)  01/28/14 426 lb (193.232 kg)  01/22/14 425 lb (192.779 kg)  01/19/14 430 lb (195.047 kg)  01/12/14 444 lb 4.8 oz (201.533 kg)  12/26/13 474 lb 3.4 oz (215.1 kg)  12/26/13 474 lb 3.4 oz (215.1 kg)  12/26/13 474 lb 3.4 oz (215.1 kg)  12/26/13 474 lb 3.4 oz (215.1 kg)  12/04/13 481 lb 8 oz (218.407 kg)  12/01/13 481 lb 8 oz (218.407 kg)  11/10/13 463 lb 8 oz (210.242 kg)  10/31/13 472 lb 14.2 oz (214.5 kg)    Body mass index is 61.11 kg/(m^2). Patient meets criteria for Morbid Obesity based on current BMI.   Current diet order is Carb Modified/Heart healthy, patient is consuming approximately >50% of meals at this time. Labs and medications reviewed.   Pt reported an intentional wt loss of 20 lbs, some of which can be attributed to fluid loss. Has been trying to decreased portion sizes, cut back on high carbohydrate foods and eat more fruits and vegetables. Encouraged pt to continue with diet modifications. Offered "High Potassium Food List" handout as pt admitted with K of 6.7. Is receiving fluids and medications to lower potassium, recommended pt limit intake of high potassium foods during admit  No nutrition interventions warranted at this time. If nutrition issues arise, please consult RD.   Atlee Abide MS RD LDN Clinical Dietitian [phone]

## 2014-02-03 NOTE — Progress Notes (Signed)
Upon arrival patient has self administered CPAP, patient states he is comfortable and resting well. Verbalizes understanding to call if any additional assistance needed.

## 2014-02-03 NOTE — Consult Note (Signed)
Douglas [name] 02/03/2014 Rexene Agent Requesting Physician:  Ree Kida  Reason for Consult:  AKI, Hyperkalemia HPI:  21M w/ hx/o NHL (on active treatment -- bendamustine + rituximab), DM2, HTN, morbid obesity, and L inguina nonhealing wound after core biopsy 10/2014 and I/D 01/2014 admitted with AKI and mild hyperkalemia.  SCr trend below.  Pt on ARB at home.  Placed on NS @ 100/hr and SCr trending down, making UOP.   Pt denies preadmission diarrhea, poor PO, LH, inc thirst, change in UOP.  No tea colored urine. No NSAIDs, Sulfa ABX    Hyperkalemia (6.1) treated with IV Ca, IV Insulin, 45gm Kayexalate overnight and K now 5.5.  Pt did had clear peaked T waves.  Has had freq BMs today.    Creatinine (mg/dL)  Date Value  01/19/2014 0.9   01/12/2014 1.5*  01/05/2014 0.9   12/29/2013 0.9   12/15/2013 1.2   12/09/2013 1.1   12/01/2013 1.1   11/10/2013 1.0      Creatinine, Ser (mg/dL)  Date Value  02/03/2014 4.09*  02/03/2014 4.86*  02/02/2014 5.42*  12/23/2013 0.92   12/22/2013 0.93   12/21/2013 0.94   12/20/2013 0.94   11/02/2013 0.86   10/30/2013 0.83   09/12/2009 0.96   ] I/Os: I/O last 3 completed shifts: In: 1243.3 [P.O.:480; I.V.:763.3] Out: 250 [Urine:250]  ROS NSAIDS: no exposure IV Contrast no exposure TMP/SMX no exposure Hypotension no exposure Balance of 12 systems is negative w/ exceptions as above  PMH  Past Medical History  Diagnosis Date  . Non Hodgkin's lymphoma January 2009  . Diabetes mellitus without complication   . Hypertension   . Sleep apnea   . Lymphadenopathy, inguinal     left   PSH  Past Surgical History  Procedure Laterality Date  . Biopsy for lymphoma Left 11/02/13    groin  . Colonoscopy N/A 12/22/2013    Procedure: COLONOSCOPY;  Surgeon: Beryle Beams, MD;  Location: [facility];  Service: Endoscopy;  Laterality: N/A;  . Esophagogastroduodenoscopy N/A 12/22/2013    Procedure: ESOPHAGOGASTRODUODENOSCOPY (EGD);  Surgeon: Beryle Beams, MD;   Location: [facility];  Service: Endoscopy;  Laterality: N/A;  . Ercp N/A 12/24/2013    Procedure: UPPER ENDOSCOPY;  Surgeon: Beryle Beams, MD;  Location: [facility];  Service: Gastroenterology;  Laterality: N/A;  . Colonoscopy N/A 12/24/2013    Procedure: COLONOSCOPY;  Surgeon: Beryle Beams, MD;  Location: [facility];  Service: Gastroenterology;  Laterality: N/A;   FH History reviewed. No pertinent family history. SH  reports that he has never smoked. He has never used smokeless tobacco. He reports that he does not drink alcohol or use illicit drugs. Allergies No Known Allergies Home medications Prior to Admission medications   Medication Sig Start Date End Date Taking? Authorizing Provider  atorvastatin (LIPITOR) 20 MG tablet Take 20 mg by mouth daily.   Yes Historical Provider, MD  docusate sodium (COLACE) 100 MG capsule Take 100 mg by mouth 2 (two) times daily.   Yes Historical Provider, MD  doxycycline (VIBRA-TABS) 100 MG tablet Take 1 tablet (100 mg total) by mouth 2 (two) times daily. 01/19/14  Yes [name], MD  Ferrous Sulfate (IRON) 325 (65 FE) MG TABS Take 325 mg by mouth 2 (two) times daily. 12/26/13  Yes Adeline Saralyn Pilar, MD  omeprazole (PRILOSEC) 40 MG capsule Take 1 capsule (40 mg total) by mouth 2 (two) times daily. 12/26/13  Yes Sheila Oats, MD  oxyCODONE-acetaminophen (PERCOCET) 10-325  MG per tablet Take 1 tablet by mouth every 6 (six) hours as needed for pain. 01/12/14  Yes Adrena E Johnson, PA-C  pioglitazone-metformin (ACTOPLUS MET) 15-500 MG per tablet Take 1 tablet by mouth 2 (two) times daily with a meal.   Yes Historical Provider, MD  polyethylene glycol (MIRALAX / GLYCOLAX) packet Take 17 g by mouth daily as needed for mild constipation.    Yes Historical Provider, MD  prochlorperazine (COMPAZINE) 10 MG tablet Take 1 tablet (10 mg total) by mouth every 6 (six) hours as needed for nausea or vomiting. 12/01/13  Yes [name], MD  valsartan-hydrochlorothiazide  (DIOVAN-HCT) 320-25 MG per tablet Take 1 tablet by mouth daily.   Yes Historical Provider, MD    Current Medications Current Facility-Administered Medications  Medication Dose Route Frequency Provider Last Rate Last Dose  . 0.9 %  sodium chloride infusion   Intravenous Continuous Maryann Mikhail, DO 100 mL/hr at 02/03/14 0846    . acetaminophen (TYLENOL) tablet 650 mg  650 mg Oral Q6H PRN Maryann Mikhail, DO       Or  . acetaminophen (TYLENOL) suppository 650 mg  650 mg Rectal Q6H PRN Maryann Mikhail, DO      . atorvastatin (LIPITOR) tablet 20 mg  20 mg Oral Daily Maryann Mikhail, DO   20 mg at 02/03/14 1125  . docusate sodium (COLACE) capsule 100 mg  100 mg Oral BID Maryann Mikhail, DO   100 mg at 02/03/14 1125  . doxycycline (VIBRA-TABS) tablet 100 mg  100 mg Oral BID AC Maryann Mikhail, DO   100 mg at 02/03/14 0845  . enoxaparin (LOVENOX) injection 40 mg  40 mg Subcutaneous Q24H Maryann Mikhail, DO   40 mg at 02/02/14 2207  . ferrous sulfate tablet 325 mg  325 mg Oral BID PC Maryann Mikhail, DO   325 mg at 02/03/14 1125  . hydrALAZINE (APRESOLINE) injection 10 mg  10 mg Intravenous Q6H PRN Maryann Mikhail, DO      . insulin aspart (novoLOG) injection 0-15 Units  0-15 Units Subcutaneous TID WC Maryann Mikhail, DO   2 Units at 02/03/14 1411  . morphine 2 MG/ML injection 2 mg  2 mg Intravenous Q4H PRN Maryann Mikhail, DO      . ondansetron (ZOFRAN) tablet 4 mg  4 mg Oral Q6H PRN Maryann Mikhail, DO       Or  . ondansetron (ZOFRAN) injection 4 mg  4 mg Intravenous Q6H PRN Maryann Mikhail, DO      . oxyCODONE-acetaminophen (PERCOCET/ROXICET) 5-325 MG per tablet 1 tablet  1 tablet Oral Q6H PRN Maryann Mikhail, DO       And  . oxyCODONE (Oxy IR/ROXICODONE) immediate release tablet 5 mg  5 mg Oral Q6H PRN Maryann Mikhail, DO   5 mg at 02/02/14 2207  . pantoprazole (PROTONIX) EC tablet 40 mg  40 mg Oral Daily Maryann Mikhail, DO   40 mg at 02/03/14 1125  . polyethylene glycol (MIRALAX / GLYCOLAX)  packet 17 g  17 g Oral Daily PRN Maryann Mikhail, DO      . sodium chloride 0.9 % injection 3 mL  3 mL Intravenous Q12H Maryann Mikhail, DO   3 mL at 02/02/14 2210    CBC  Recent Labs Lab 02/02/14 1525 02/03/14 0119 02/03/14 1043  WBC 9.7 9.1 7.8  HGB 7.9* 7.8* 7.2*  HCT 24.1* 23.9* 22.7*  MCV 85.2 85.1 86.3  PLT 301 299 185   Basic Metabolic Panel  Recent Labs Lab 02/02/14  1525 02/03/14 0119 02/03/14 1043  NA 128* 130* 133*  K 6.1* 5.7* 5.5*  CL 87* 90* 94*  CO2 19 18* 21  GLUCOSE 186* 167* 147*  BUN 105* 101* 99*  CREATININE 5.42* 4.86* 4.09*  CALCIUM 10.4 10.2 10.0    Physical Exam  Blood pressure 93/43, pulse 103, temperature 98.3 F (36.8 C), temperature source Oral, resp. rate 18, height 5' 10"  (1.778 m), weight 193.2 kg (425 lb 14.9 oz), SpO2 100.00%. GEN: obese, in bed, nad ENT: NCAT, fair dentition EYES: EOMI CV: RRR, no rub PULM: CTAB, nl wob ABD: obese, soft, limited SKIN: L inguinal region bandaged EXT:L sided LEE 2-3+  UA reviewed   Assessment 32M with AKI in setting of ARB, nonhealing inguinal wound on doxycycline, and active treatment for NHL.    1. AKI, normal baseline GFR.  ? ARB induced.  Need to keep AIN in differential.  Improving 2. Hyperkalemia w/ EKG changes, improving 3. NHL on active therapy 4. Nonhealing L inguinal wound on doxycycline  PLAN 1. Cont Hydration with NS @ 100hr 2. Cont to hold Metfomrin, ARB, HCTZ, Actos 3. F/u renal US 4. Daily renal panels 5. Will follow along  [name] MD [phone] 02/03/2014, 2:21 PM

## 2014-02-03 NOTE — Consult Note (Signed)
WOC asked to evaluate groin wound, however noted surgical team consulted. Since the time of our consult [facility] has evaluated pt and has written wound care orders. For this reason WOC will not consult at this time.   Re consult if needed, will not follow at this time. Thanks  Douglas [name], [place] [phone])

## 2014-02-04 ENCOUNTER — Ambulatory Visit: Payer: BC Managed Care – PPO | Admitting: Internal Medicine

## 2014-02-04 ENCOUNTER — Ambulatory Visit: Payer: BC Managed Care – PPO

## 2014-02-04 LAB — BASIC METABOLIC PANEL
BUN: 79 mg/dL — AB (ref 6–23)
CALCIUM: 10 mg/dL (ref 8.4–10.5)
CHLORIDE: 99 meq/L (ref 96–112)
CO2: 22 mEq/L (ref 19–32)
Creatinine, Ser: 2.54 mg/dL — ABNORMAL HIGH (ref 0.50–1.35)
GFR calc Af Amer: 31 mL/min — ABNORMAL LOW (ref >90)
GFR, EST NON AFRICAN AMERICAN: 27 mL/min — AB (ref >90)
Glucose, Bld: 138 mg/dL — ABNORMAL HIGH (ref 70–99)
Potassium: 5.3 mEq/L (ref 3.7–5.3)
Sodium: 137 mEq/L (ref 137–147)

## 2014-02-04 LAB — GLUCOSE, CAPILLARY
GLUCOSE-CAPILLARY: 129 mg/dL — AB (ref 70–99)
GLUCOSE-CAPILLARY: 149 mg/dL — AB (ref 70–99)
Glucose-Capillary: 197 mg/dL — ABNORMAL HIGH (ref 70–99)
Glucose-Capillary: 128 mg/dL — ABNORMAL HIGH (ref 70–99)

## 2014-02-04 LAB — CBC
HCT: 25 % — ABNORMAL LOW (ref 39.0–52.0)
HEMOGLOBIN: 7.8 g/dL — AB (ref 13.0–17.0)
MCH: 27.2 pg (ref 26.0–34.0)
MCHC: 31.2 g/dL (ref 30.0–36.0)
MCV: 87.1 fL (ref 78.0–100.0)
Platelets: 281 10*3/uL (ref 150–400)
RBC: 2.87 MIL/uL — ABNORMAL LOW (ref 4.22–5.81)
RDW: 16.8 % — ABNORMAL HIGH (ref 11.5–15.5)
WBC: 7.3 10*3/uL (ref 4.0–10.5)

## 2014-02-04 MED ORDER — DIPHENHYDRAMINE HCL 25 MG PO CAPS
25.0000 mg | ORAL_CAPSULE | Freq: Four times a day (QID) | ORAL | Status: DC | PRN
  Administered 2014-02-04 – 2014-02-05 (×2): 25 mg via ORAL
  Filled 2014-02-04 (×2): qty 1

## 2014-02-04 NOTE — Progress Notes (Signed)
Foyil in wound.  Continue dressing changes.

## 2014-02-04 NOTE — Progress Notes (Signed)
  Subjective: Stable, no real change.  Dressing changes are ongoing.  Site is clean.  Objective: Vital signs in last 24 hours: Temp:  [98.2 F (36.8 C)-98.4 F (36.9 C)] 98.4 F (36.9 C) (03/19 0615) Pulse Rate:  [100-110] 100 (03/19 0615) Resp:  [18-20] 18 (03/19 0615) BP: (110)/(52-60) 110/60 mmHg (03/19 0615) SpO2:  [98 %-99 %] 98 % (03/19 0615) Last BM Date: 02/03/14 Afebrile, VSS Creatinine is improving Intake/Output from previous day: 03/18 0701 - 03/19 0700 In: 4140 [P.O.:960; I.V.:3180] Out: 3150 [Urine:3150] Intake/Output this shift:    General appearance: alert, cooperative and no distress Skin: lymphnode open site is clean  Lab Results:   Recent Labs  02/03/14 1043 02/04/14 0441  WBC 7.8 7.3  HGB 7.2* 7.8*  HCT 22.7* 25.0*  PLT 254 281    BMET  Recent Labs  02/03/14 1043 02/04/14 0441  NA 133* 137  K 5.5* 5.3  CL 94* 99  CO2 21 22  GLUCOSE 147* 138*  BUN 99* 79*  CREATININE 4.09* 2.54*  CALCIUM 10.0 10.0   PT/INR No results found for this basename: LABPROT, INR,  in the last 72 hours  No results found for this basename: AST, ALT, ALKPHOS, BILITOT, PROT, ALBUMIN,  in the last 168 hours   Lipase  No results found for this basename: lipase     Studies/Results: Dg Chest 1 View  02/03/2014   CLINICAL DATA:  Central catheter placement  EXAM: CHEST - 1 VIEW  COMPARISON:  Chest CT December 02, 2013  FINDINGS: Central catheter tip is in the right axillary vein region. There is again noted a 1.7 x 1.3 cm nodular opacity in the right midlung. There is no edema or consolidation. Heart is mildly enlarged with normal pulmonary vascularity. No apparent adenopathy. No pneumothorax. No bone lesions.  IMPRESSION: Central catheter tip in right axillary vein. Nodular opacity right mid lung. No edema or consolidation.   Electronically Signed   By: Lowella Grip M.D.   On: 02/03/2014 07:06   US Renal  02/03/2014   CLINICAL DATA:  Acute kidney injury.   EXAM: RENAL/URINARY TRACT ULTRASOUND COMPLETE  COMPARISON:  CT ABD/PELVIS W CM dated 10/30/2013  FINDINGS: Right Kidney:  Length: 11.6 cm. Echogenicity within normal limits. No mass or hydronephrosis visualized.  Left Kidney:  Length: 11.8 cm. Echogenicity within normal limits. No mass or hydronephrosis visualized.  Bladder:  Appears normal for degree of bladder distention.  IMPRESSION: Unremarkable study.   Electronically Signed   By: Rolm Baptise M.D.   On: 02/03/2014 16:56    Medications: . atorvastatin  20 mg Oral Daily  . docusate sodium  100 mg Oral BID  . doxycycline  100 mg Oral BID AC  . ferrous sulfate  325 mg Oral BID PC  . heparin subcutaneous  5,000 Units Subcutaneous 3 times per day  . insulin aspart  0-15 Units Subcutaneous TID WC  . pantoprazole  40 mg Oral Daily  . sodium chloride  3 mL Intravenous Q12H    Assessment/Plan Open biopsy site; left groin with Non Hodgkin's lymphoma  Acute renal failure  Hyperkalemia  AODM  Hypertension  Sleep Apnea  BMI 61   Plan:  Await culture, continue dressing changes   LOS: 2 days    Myer Bohlman 02/04/2014

## 2014-02-04 NOTE — Progress Notes (Signed)
PROGRESS NOTE    Douglas [name] JJK:[id number] DOB: 1956/08/02 DOA: 02/02/2014 PCP: [name], MD  HPI/Brief narrative 58 year old male with history of NHL, DM 2, HTN, morbid obesity, nonhealing left inguinal wound after core biopsy 10/2014 and I&D 01/2014, follows at wound care center, was told to come to the hospital for hyperkalemia. He is on ARB at home.   Assessment/Plan:  1. Acute renal failure/hyperkalemia: Creatinine was normal recently.? ARB versus hemodynamics versus chemotherapy medications related. EKG without hyperkalemia changes. Nephrology consulted and input appreciated. Improved after brief IV fluids-DC'd 3/19. Continue to hold metformin, ARB, HCTZ and Actos. Renal ultrasound negative. Trend daily BMP. Improving. 2. Nonhealing left groin wound: Status post I&D 3/ 11-Mar-2014. Surgical input appreciated-no intervention needed. Continue oral doxycycline. 3. Non-Hodgkin's lymphoma: Oncology input appreciated-outpatient followup 4. Hypertension: Soft blood pressure/control. Holding above medications. When necessary IV hydralazine. 5. Type II DM: Hold oral medications. Last A1c: 7.6 on 12/20/13. SSI. 6. Dyslipidemia: Continue statins. 7. Anemia: Likely multifactorial from chronic disease and chemotherapy. Follow daily CBCs and transfuse for hemoglobin less than 7 g per DL. 8. Obesity: 9. Nonfunctioning PICC line: DC'ed PICC line 3/18. Patient has peripheral IV access. 10. OSA: Continue nightly CPAP   Code Status: Full Family Communication: Discussed with patient Disposition Plan: Home when medically stable- possibly 3/20 creatinine continues to improve.   Consultants:  General surgery  Oncology  Nephrology  Procedures:  None  Antibiotics:  Doxycycline   Subjective: Denies complaints.  Objective: Filed Vitals:   02/03/14 2100 02/03/14 2203 02/04/14 0615 02/04/14 1441  BP: 110/55  110/60 102/45  Pulse: 107  100 104  Temp: 98.4 F (36.9 C)  98.4 F  (36.9 C) 97.6 F (36.4 C)  TempSrc: Oral  Oral Oral  Resp: 20 18 18 16   Height:      Weight:      SpO2: 99%  98% 99%    Intake/Output Summary (Last 24 hours) at 02/04/14 1555 Last data filed at 02/04/14 1524  Gross per 24 hour  Intake   3125 ml  Output   4300 ml  Net  -1175 ml   Filed Weights   02/02/14 1945 02/02/14 1959 02/03/14 0503  Weight: 193.232 kg (426 lb) 193.2 kg (425 lb 14.9 oz) 193.2 kg (425 lb 14.9 oz)     Exam:  General exam: Morbidly obese male lying comfortably in bed. Respiratory system: Clear. No increased work of breathing. Cardiovascular system: S1 & S2 heard, RRR. No JVD, murmurs, gallops, clicks or pedal edema. Telemetry: Sinus rhythm-sinus tachycardia in the 100s. Gastrointestinal system: Abdomen is nondistended, soft and nontender. Normal bowel sounds heard. Large lymph node in the left groin with overlying large open wound with pink granulation tissue in the base and not actively bleeding & no acute signs. Central nervous system: Alert and oriented. No focal neurological deficits. Extremities: Symmetric 5 x 5 power.   Data Reviewed: Basic Metabolic Panel:  Recent Labs Lab 02/02/14 1525 02/03/14 0119 02/03/14 1043 02/04/14 0441  NA 128* 130* 133* 137  K 6.1* 5.7* 5.5* 5.3  CL 87* 90* 94* 99  CO2 19 18* 21 22  GLUCOSE 186* 167* 147* 138*  BUN 105* 101* 99* 79*  CREATININE 5.42* 4.86* 4.09* 2.54*  CALCIUM 10.4 10.2 10.0 10.0   Liver Function Tests: No results found for this basename: AST, ALT, ALKPHOS, BILITOT, PROT, ALBUMIN,  in the last 168 hours No results found for this basename: LIPASE, AMYLASE,  in the last 168 hours No results found  for this basename: AMMONIA,  in the last 168 hours CBC:  Recent Labs Lab 02/02/14 1525 02/03/14 0119 02/03/14 1043 02/04/14 0441  WBC 9.7 9.1 7.8 7.3  HGB 7.9* 7.8* 7.2* 7.8*  HCT 24.1* 23.9* 22.7* 25.0*  MCV 85.2 85.1 86.3 87.1  PLT 301 299 254 281   Cardiac Enzymes: No results found for  this basename: CKTOTAL, CKMB, CKMBINDEX, TROPONINI,  in the last 168 hours BNP (last 3 results) No results found for this basename: PROBNP,  in the last 8760 hours CBG:  Recent Labs Lab 02/03/14 1225 02/03/14 1728 02/03/14 2105 02/04/14 0732 02/04/14 1156  GLUCAP 142* 136* 157* 129* 197*    Recent Results (from the past 240 hour(s))  CULTURE, ROUTINE-ABSCESS     Status: None   Collection Time    02/03/14 12:11 PM      Result Value Ref Range Status   Specimen Description ABSCESS LEFT GROIN   Final   Special Requests NONE   Final   Gram Stain     Final   Value: ABUNDANT WBC PRESENT,BOTH PMN AND MONONUCLEAR     NO SQUAMOUS EPITHELIAL CELLS SEEN     ABUNDANT GRAM POSITIVE COCCI IN PAIRS     MODERATE GRAM NEGATIVE RODS     Performed at [facility]     Final   Value: Culture reincubated for better growth     Performed at [facility]   Report Status PENDING   Incomplete      Studies: Dg Chest 1 View  02/03/2014   CLINICAL DATA:  Central catheter placement  EXAM: CHEST - 1 VIEW  COMPARISON:  Chest CT December 02, 2013  FINDINGS: Central catheter tip is in the right axillary vein region. There is again noted a 1.7 x 1.3 cm nodular opacity in the right midlung. There is no edema or consolidation. Heart is mildly enlarged with normal pulmonary vascularity. No apparent adenopathy. No pneumothorax. No bone lesions.  IMPRESSION: Central catheter tip in right axillary vein. Nodular opacity right mid lung. No edema or consolidation.   Electronically Signed   By: Lowella Grip M.D.   On: 02/03/2014 07:06   US Renal  02/03/2014   CLINICAL DATA:  Acute kidney injury.  EXAM: RENAL/URINARY TRACT ULTRASOUND COMPLETE  COMPARISON:  CT ABD/PELVIS W CM dated 10/30/2013  FINDINGS: Right Kidney:  Length: 11.6 cm. Echogenicity within normal limits. No mass or hydronephrosis visualized.  Left Kidney:  Length: 11.8 cm. Echogenicity within normal limits. No mass or  hydronephrosis visualized.  Bladder:  Appears normal for degree of bladder distention.  IMPRESSION: Unremarkable study.   Electronically Signed   By: Rolm Baptise M.D.   On: 02/03/2014 16:56        Scheduled Meds: . atorvastatin  20 mg Oral Daily  . docusate sodium  100 mg Oral BID  . doxycycline  100 mg Oral BID AC  . ferrous sulfate  325 mg Oral BID PC  . heparin subcutaneous  5,000 Units Subcutaneous 3 times per day  . insulin aspart  0-15 Units Subcutaneous TID WC  . pantoprazole  40 mg Oral Daily  . sodium chloride  3 mL Intravenous Q12H   Continuous Infusions:    Principal Problem:   AKI (acute kidney injury) Active Problems:   NHL (non-Hodgkin's lymphoma)   Mass of left thigh   HTN (hypertension)   Dyslipidemia   Anemia of chronic disease   Morbid obesity   Diabetes mellitus  Cellulitis and abscess of left groin   Hyperkalemia    Time spent: 25 minutes    Huckleberry Martinson, MD, FACP, FHM. [facility] Pager [phone]  If 7PM-7AM, please contact night-coverage [email or web address] Password [email or web address] 02/04/2014, 3:55 PM    LOS: 2 days

## 2014-02-04 NOTE — Progress Notes (Signed)
Admit: 02/02/2014 LOS: 2  40M with AKI in setting of ARB, nonhealing inguinal wound on doxycycline, and active treatment for NHL.   Subjective:  NAEON SCr quickly improving Excellent UOP K WNL -- 5.3 Renal US unremarkable Pt eating/drinking, feels well   03/18 0701 - 03/19 0700 In: 3016 [P.O.:960; I.V.:3180] Out: 3150 [Urine:3150]  Filed Weights   02/02/14 1945 02/02/14 1959 02/03/14 0503  Weight: 193.232 kg (426 lb) 193.2 kg (425 lb 14.9 oz) 193.2 kg (425 lb 14.9 oz)    Current meds: reviewed  Current Labs: reviewed    Physical Exam:  Blood pressure 110/60, pulse 100, temperature 98.4 F (36.9 C), temperature source Oral, resp. rate 18, height 5\' 10"  (1.778 m), weight 193.2 kg (425 lb 14.9 oz), SpO2 98.00%. GEN: obese, in bed, nad  ENT: NCAT, fair dentition  EYES: EOMI  CV: RRR, no rub  PULM: CTAB, nl wob  ABD: obese, soft, limited  SKIN: L inguinal region bandaged  EXT:L sided LEE 2-3+   Assessment   1. AKI, normal baseline GFR. ? ARB induced. Improving with hydration 2. Hyperkalemia w/ EKG changes, resolved 3. NHL on active therapy 4. Nonhealing L inguinal wound on doxycycline  PLAN  1. Stop IVFs, pt able to take in adequate PO 2. If SCr further improved tomorrow safe for dc and f/u with oncology / PMD 3. Would hold ARB / thiazide upon discharge  [name] MD 02/04/2014, 11:59 AM   Recent Labs Lab 02/03/14 0119 02/03/14 1043 02/04/14 0441  NA 130* 133* 137  K 5.7* 5.5* 5.3  CL 90* 94* 99  CO2 18* 21 22  GLUCOSE 167* 147* 138*  BUN 101* 99* 79*  CREATININE 4.86* 4.09* 2.54*  CALCIUM 10.2 10.0 10.0    Recent Labs Lab 02/03/14 0119 02/03/14 1043 02/04/14 0441  WBC 9.1 7.8 7.3  HGB 7.8* 7.2* 7.8*  HCT 23.9* 22.7* 25.0*  MCV 85.1 86.3 87.1  PLT 299 254 281

## 2014-02-05 ENCOUNTER — Telehealth: Payer: Self-pay | Admitting: Internal Medicine

## 2014-02-05 LAB — CBC
HCT: 23.6 % — ABNORMAL LOW (ref 39.0–52.0)
HEMOGLOBIN: 7.7 g/dL — AB (ref 13.0–17.0)
MCH: 28.2 pg (ref 26.0–34.0)
MCHC: 32.6 g/dL (ref 30.0–36.0)
MCV: 86.4 fL (ref 78.0–100.0)
Platelets: 287 10*3/uL (ref 150–400)
RBC: 2.73 MIL/uL — ABNORMAL LOW (ref 4.22–5.81)
RDW: 17 % — AB (ref 11.5–15.5)
WBC: 7.2 10*3/uL (ref 4.0–10.5)

## 2014-02-05 LAB — BASIC METABOLIC PANEL
BUN: 65 mg/dL — AB (ref 6–23)
CALCIUM: 9.7 mg/dL (ref 8.4–10.5)
CO2: 21 mEq/L (ref 19–32)
Chloride: 98 mEq/L (ref 96–112)
Creatinine, Ser: 1.95 mg/dL — ABNORMAL HIGH (ref 0.50–1.35)
GFR calc Af Amer: 43 mL/min — ABNORMAL LOW (ref >90)
GFR calc non Af Amer: 37 mL/min — ABNORMAL LOW (ref >90)
GLUCOSE: 128 mg/dL — AB (ref 70–99)
Potassium: 4.4 mEq/L (ref 3.7–5.3)
SODIUM: 136 meq/L — AB (ref 137–147)

## 2014-02-05 LAB — GLUCOSE, CAPILLARY
GLUCOSE-CAPILLARY: 131 mg/dL — AB (ref 70–99)
Glucose-Capillary: 121 mg/dL — ABNORMAL HIGH (ref 70–99)
Glucose-Capillary: 138 mg/dL — ABNORMAL HIGH (ref 70–99)

## 2014-02-05 LAB — HEMOGLOBIN AND HEMATOCRIT, BLOOD
HEMATOCRIT: 25.7 % — AB (ref 39.0–52.0)
Hemoglobin: 8.2 g/dL — ABNORMAL LOW (ref 13.0–17.0)

## 2014-02-05 LAB — PREPARE RBC (CROSSMATCH)

## 2014-02-05 NOTE — Progress Notes (Signed)
Patient is resting comfortably and still reports pain of 0. Patient's wound is still draining moderate/copious. Patient requested not to have the dressing changed until he is ready to get discharged today so it will be dry when he goes home. Patient is currently waiting for labs results to determine if he will still be discharged today, if not the dressing will need to be changed per orders. The patient stated he is on pills instead of insulin at home. Educated patient on how insulin can control his diabetes better than pills and he may want to discuss that with his physician. The patient expressed understanding, but does not seem to want to take insulin shots. The patient's lung sounds were somewhat diminished in the lower lobes bilaterally, but overall he still had positive lung sounds. The patient voided 700 ml this afternoon and is anticipating discharge. Lacretia Leigh, SN, RCC

## 2014-02-05 NOTE — Progress Notes (Signed)
Admit: 02/02/2014 LOS: 3  64M with AKI in setting of ARB, nonhealing inguinal wound on doxycycline, and active treatment for NHL.   Subjective:  NAEON Excellent UOP Further improved SCr Feels well No c/o, eating well  03/19 0701 - 03/20 0700 In: 1545 [P.O.:1080; I.V.:465] Out: 3000 [Urine:3000]  Filed Weights   02/02/14 1959 02/03/14 0503 02/05/14 0500  Weight: 193.2 kg (425 lb 14.9 oz) 193.2 kg (425 lb 14.9 oz) 183.435 kg (404 lb 6.4 oz)    Current meds: reviewed  Current Labs: reviewed    Physical Exam:  Blood pressure 110/55, pulse 103, temperature 98.1 F (36.7 C), temperature source Oral, resp. rate 18, height 5\' 10"  (1.778 m), weight 183.435 kg (404 lb 6.4 oz), SpO2 99.00%. GEN: obese, in bed, nad  ENT: NCAT, fair dentition  EYES: EOMI  CV: RRR, no rub  PULM: CTAB, nl wob  ABD: obese, soft, limited  SKIN: L inguinal region bandaged  EXT:L sided LEE 2-3+   Assessment   1. AKI, normal baseline GFR. ? ARB induced. Improving with hydration 2. Hyperkalemia w/ EKG changes, resolved  3. NHL on active therapy 4. Nonhealing L inguinal wound on doxycycline  PLAN  1. OK for dc from my perspective 2. Can f/u renal panel with PMD / Oncology 3. Unless persistenly abnl SCr, no need to f/u with nephrology as outpatient  [name] MD 02/05/2014, 1:06 PM   Recent Labs Lab 02/03/14 1043 02/04/14 0441 02/05/14 0343  NA 133* 137 136*  K 5.5* 5.3 4.4  CL 94* 99 98  CO2 21 22 21   GLUCOSE 147* 138* 128*  BUN 99* 79* 65*  CREATININE 4.09* 2.54* 1.95*  CALCIUM 10.0 10.0 9.7    Recent Labs Lab 02/03/14 1043 02/04/14 0441 02/05/14 0343  WBC 7.8 7.3 7.2  HGB 7.2* 7.8* 7.7*  HCT 22.7* 25.0* 23.6*  MCV 86.3 87.1 86.4  PLT 254 281 287

## 2014-02-05 NOTE — Progress Notes (Signed)
Discharge teaching completed. Pt waiting for friend for ride home.

## 2014-02-05 NOTE — Progress Notes (Signed)
Patient accidentally pulled IV out while administering blood.  Patient IV restarted at 1225p and blood administration continued.  Patient vitals stable and blood infusing.  Will continue to monitor patient.

## 2014-02-05 NOTE — Discharge Summary (Signed)
Physician Discharge Summary  Douglas [name] LZJ:[id number] DOB: [birth year] DOA: 02/02/2014  PCP: [name], MD  Admit date: 02/02/2014 Discharge date: 02/05/2014  Time spent: Greater than 30 minutes  Recommendations for Outpatient Follow-up:  1. Dr. [name], PCP in one week for review of labs and evaluation of DM & HTN medications. 2. [facility]: To draw labs (CBC and BMP) on 02/10/14 and send results to PCP and Dr. [name], Oncology. 3. Dr. [name], Surgery: Keep up prior appointment for 02/22/2014 at 10:40 AM. Followup out standing wound culture results that were sent from the hospital. 4. Dr. [name], oncology: Keep her prior appointment for 4/15 and 4/16    Discharge Diagnoses:  Principal Problem:   AKI (acute kidney injury) Active Problems:   NHL (non-Hodgkin's lymphoma)   Mass of left thigh   HTN (hypertension)   Dyslipidemia   Anemia of chronic disease   Morbid obesity   Diabetes mellitus   Cellulitis and abscess of left groin   Hyperkalemia   Discharge Condition: Improved & Stable  Diet recommendation: Heart healthy and diabetic diet.  Filed Weights   02/02/14 1959 02/03/14 0503 02/05/14 0500  Weight: 193.2 kg (425 lb 14.9 oz) 193.2 kg (425 lb 14.9 oz) 183.435 kg (404 lb 6.4 oz)    History of present illness:  58 year old male with history of NHL, DM 2, HTN, morbid obesity, nonhealing left inguinal wound after core biopsy 10/2014 and I&D 01/2014, follows at wound care center, was told to come to the hospital for hyperkalemia. He is on ARB at home.  Hospital Course:   1. Acute renal failure/hyperkalemia: Creatinine was 0.9 on 01/19/14. Possibly related to ARB use versus hemodynamics. EKG without hyperkalemia changes. Renal ultrasound negative. Nephrology consulted. ARB, HCTZ and metformin were held. He was hydrated with IV fluids and received Kayexalate. Hypokalemia resolved. Creatinine has steadily improved. His creatinine  peaked at 5.42. Recommend followup of labs in a few days. Will continue to hold above medications at discharge. 2. Nonhealing left groin wound: Patient had a necrotic, infected left inguinal lymph node leading to abscess that required incision and drainage 3/15 and is slow to heal. Surgeons consulted and recommended continuing dressing changes and no further surgical intervention. Continue oral doxycycline. 3. Follicular low-grade non-Hodgkin's lymphoma with large left groin lymphadenopathy: Oncology consulted. He status post 2 cycles of systemic chemotherapy and has made significant improvement in his lymphadenopathy with decrease in the edema of his left lower extremity. His chemotherapy is currently on hold until improvement in his condition, renal functions and left inguinal wound. 4. Hypertension: Patient's antihypertensives were held in the hospital despite which his blood pressures are controlled or even soft. Reevaluate during outpatient followup with PCP. 5. Type II DM: Last A1c: 7.6 on 12/20/13. Due to worsening renal functions, patient's oral hypoglycemics/Actos-metformin, have been held. He was treated with SSI in the hospital and required very small doses. He should be able to manage with diet control alone temporarily. This is to be reassessed during followup with PCP in the next few days. 6. Dyslipidemia: Continue statins. 7. Anemia: Likely multifactorial from chronic disease and chemotherapy. Patient's hemoglobin dropped to 7.7 g on 3/20. As per Dr. [name] recommendation, patient received 1 unit of PRBCs and hemoglobin improved to 8.2. Followup CBC in the next few days. 8. Obesity: 9. Nonfunctioning PICC line: DC'ed PICC line 3/18. This can be placed as outpatient when needed. 10. OSA: Continue nightly CPAP     Consultations:  General surgery  Medical oncology  Procedures:  Removed and nonfunctioning PICC line on 3/18    Discharge Exam:  Complaints:  Patient denies  complaints. Overall feels well.  Filed Vitals:   02/05/14 1231 02/05/14 1310 02/05/14 1355 02/05/14 1410  BP: 110/55 108/56 103/56   Pulse: 103 100 102 90  Temp: 98.1 F (36.7 C) 98.1 F (36.7 C) 98.2 F (36.8 C) 97.4 F (36.3 C)  TempSrc: Oral Oral Oral Oral  Resp: _0 Height:      Weight:      SpO2: 99% 100% 98% 97%    General exam: Morbidly obese male lying comfortably in bed.  Respiratory system: Clear. No increased work of breathing.  Cardiovascular system: S1 & S2 heard, RRR. No JVD, murmurs, gallops, clicks or pedal edema.  Gastrointestinal system: Abdomen is nondistended, soft and nontender. Normal bowel sounds heard. Large lymph node in the left groin with overlying large open wound with pink granulation tissue in the base and not actively bleeding & no acute signs.  Central nervous system: Alert and oriented. No focal neurological deficits.  Extremities: Symmetric 5 x 5 power.   Discharge Instructions      Discharge Orders   Future Appointments Provider Department Dept Phone   02/22/2014 10:40 AM [name], MD [facility], [place] [phone]   03/03/2014 10:00 AM [facility] [phone]   03/04/2014 10:15 AM [facility] [phone]   Future Orders Complete By Expires   Call MD for:  extreme fatigue  As directed    Call MD for:  persistant dizziness or light-headedness  As directed    Call MD for:  redness, tenderness, or signs of infection (pain, swelling, redness, odor or green/yellow discharge around incision site)  As directed    Call MD for:  temperature >100.4  As directed    Diet - low sodium heart healthy  As directed    Diet Carb Modified  As directed    Discharge wound care:  As directed    Comments:     Continue wound care as prior to admission.   Increase activity slowly  As directed        Medication List    STOP  taking these medications       pioglitazone-metformin 15-500 MG per tablet  Commonly known as:  ACTOPLUS MET     valsartan-hydrochlorothiazide 320-25 MG per tablet  Commonly known as:  DIOVAN-HCT      TAKE these medications       atorvastatin 20 MG tablet  Commonly known as:  LIPITOR  Take 20 mg by mouth daily.     docusate sodium 100 MG capsule  Commonly known as:  COLACE  Take 100 mg by mouth 2 (two) times daily.     doxycycline 100 MG tablet  Commonly known as:  VIBRA-TABS  Take 1 tablet (100 mg total) by mouth 2 (two) times daily.     Iron 325 (65 FE) MG Tabs  Take 325 mg by mouth 2 (two) times daily.     omeprazole 40 MG capsule  Commonly known as:  PRILOSEC  Take 1 capsule (40 mg total) by mouth 2 (two) times daily.     oxyCODONE-acetaminophen 10-325 MG per tablet  Commonly known as:  PERCOCET  Take 1 tablet by mouth every 6 (six) hours as needed for pain.     polyethylene glycol packet  Commonly known  as:  MIRALAX / GLYCOLAX  Take 17 g by mouth daily as needed for mild constipation.     prochlorperazine 10 MG tablet  Commonly known as:  COMPAZINE  Take 1 tablet (10 mg total) by mouth every 6 (six) hours as needed for nausea or vomiting.       Follow-up Information   Follow up with [name], MD. Schedule an appointment as soon as possible for a visit in 1 week. (review of labs sent by [facility], assess DM & HTN medications)    Specialty:  Family Medicine   Contact information:   [place] [address] [phone]       Follow up with [place]. (To draw labs (CBC & BMP) on 02/10/2014 and send results to PCP and Dr. [name].)       The results of significant diagnostics from this hospitalization (including imaging, microbiology, ancillary and laboratory) are listed below for reference.    Significant Diagnostic Studies: Dg Chest 1 View  02/03/2014   CLINICAL DATA:  Central catheter placement  EXAM: CHEST - 1 VIEW   COMPARISON:  Chest CT December 02, 2013  FINDINGS: Central catheter tip is in the right axillary vein region. There is again noted a 1.7 x 1.3 cm nodular opacity in the right midlung. There is no edema or consolidation. Heart is mildly enlarged with normal pulmonary vascularity. No apparent adenopathy. No pneumothorax. No bone lesions.  IMPRESSION: Central catheter tip in right axillary vein. Nodular opacity right mid lung. No edema or consolidation.   Electronically Signed   By: Lowella Grip M.D.   On: 02/03/2014 07:06   US Renal  02/03/2014   CLINICAL DATA:  Acute kidney injury.  EXAM: RENAL/URINARY TRACT ULTRASOUND COMPLETE  COMPARISON:  CT ABD/PELVIS W CM dated 10/30/2013  FINDINGS: Right Kidney:  Length: 11.6 cm. Echogenicity within normal limits. No mass or hydronephrosis visualized.  Left Kidney:  Length: 11.8 cm. Echogenicity within normal limits. No mass or hydronephrosis visualized.  Bladder:  Appears normal for degree of bladder distention.  IMPRESSION: Unremarkable study.   Electronically Signed   By: Rolm Baptise M.D.   On: 02/03/2014 16:56    Microbiology: Recent Results (from the past 240 hour(s))  CULTURE, ROUTINE-ABSCESS     Status: None   Collection Time    02/03/14 12:11 PM      Result Value Ref Range Status   Specimen Description ABSCESS LEFT GROIN   Final   Special Requests NONE   Final   Gram Stain     Final   Value: ABUNDANT WBC PRESENT,BOTH PMN AND MONONUCLEAR     NO SQUAMOUS EPITHELIAL CELLS SEEN     ABUNDANT GRAM POSITIVE COCCI IN PAIRS     MODERATE GRAM NEGATIVE RODS     Performed at [facility]   Culture     Final   Value: MODERATE GRAM NEGATIVE RODS     MODERATE STAPHYLOCOCCUS AUREUS     Performed at [facility]   Report Status PENDING   Incomplete     Labs: Basic Metabolic Panel:  Recent Labs Lab 02/02/14 1525 02/03/14 0119 02/03/14 1043 02/04/14 0441 02/05/14 0343  NA 128* 130* 133* 137 136*  K 6.1* 5.7* 5.5* 5.3 4.4  CL 87*  90* 94* 99 98  CO2 19 18* _0 GLUCOSE 186* 167* 147* 138* 128*  BUN 105* 101* 99* 79* 65*  CREATININE 5.42* 4.86* 4.09* 2.54* 1.95*  CALCIUM  10.4 10.2 10.0 10.0 9.7   Liver Function Tests: No results found for this basename: AST, ALT, ALKPHOS, BILITOT, PROT, ALBUMIN,  in the last 168 hours No results found for this basename: LIPASE, AMYLASE,  in the last 168 hours No results found for this basename: AMMONIA,  in the last 168 hours CBC:  Recent Labs Lab 02/02/14 1525 02/03/14 0119 02/03/14 1043 02/04/14 0441 02/05/14 0343 02/05/14 1551  WBC 9.7 9.1 7.8 7.3 7.2  --   HGB 7.9* 7.8* 7.2* 7.8* 7.7* 8.2*  HCT 24.1* 23.9* 22.7* 25.0* 23.6* 25.7*  MCV 85.2 85.1 86.3 87.1 86.4  --   PLT 301 299 254 281 287  --    Cardiac Enzymes: No results found for this basename: CKTOTAL, CKMB, CKMBINDEX, TROPONINI,  in the last 168 hours BNP: BNP (last 3 results) No results found for this basename: PROBNP,  in the last 8760 hours CBG:  Recent Labs Lab 02/04/14 1156 02/04/14 1713 02/04/14 2054 02/05/14 0730 02/05/14 1143  GLUCAP 197* 128* 149* 121* 138*    Additional labs: 1. Serum uric acid: 12.9   Signed:  HONGALGI,ANAND, MD, FACP, FHM. [facility] Pager [phone]  If 7PM-7AM, please contact night-coverage [email or web address] Password [facility] 02/05/2014, 4:58 PM

## 2014-02-05 NOTE — Progress Notes (Signed)
Changed leg wound dressing and educated patient how to complete the procedure. Pt verbalized understanding.

## 2014-02-05 NOTE — Progress Notes (Signed)
I have read and reviewed all documentation from student nurse.

## 2014-02-05 NOTE — Progress Notes (Signed)
Patient was sitting on the side of the bed while receiving blood in the left arm and accidentally pulled the iv line out. The nurse restarted the iv in the right arm after vein scan to locate a possible iv site. Restart of the IV was successful and the patient was able to continue the blood transfusion. The doctor came in to check the patients wound and advised the packing needed to be tunneled into the wound more. He pushed the packing deeper into the wound and I redressed the wound. The wound is continuing to have moderate to copious drainage. I ordered lunch for the patient, which he is now eating, his bedside table and call light are within reach. Lacretia Leigh, SN, RCC

## 2014-02-05 NOTE — Telephone Encounter (Signed)
, °

## 2014-02-05 NOTE — Progress Notes (Signed)
Changed dressing on patients wound. The dressing removed was soaked with yellow drainage and also had some red drainage present. The wound was moist, pink and red with about 10-15% slough. The wound had moderate to copious drainage within the time it took to remove the old packing and add new packing. The wound was packed with kerlex, wet to dry with normal saline and covered with an ABD pad. Lacretia Leigh, SN, RCC

## 2014-02-05 NOTE — Progress Notes (Signed)
Upon entering the room the patient was already awake but still in bed. The patient had eaten breakfast. He ate 100% of his breakfast and fluid intake was 325ml. His hair was even distributed, skin was appropriate for his race, with no bruises, scars or blemishes. Patient has a wound at the upper left groin region with moderate to copious drainage. Lower extremities were dry, cracked, scaly with discoloration and his feet were dry and peeling. Patient had positive upper body circulation, nails were short, clean and neat with capillary refill within 3 seconds. Patients inspiration and expirations were even and respirations were within normal limits. Patient had normal breath sounds and experienced no pain with breathing. Lacretia Leigh, SN, RCC

## 2014-02-05 NOTE — Progress Notes (Signed)
Patient has completed the blood transfusion, iv line is continuous with normal saline 0.9%. Patients family member is assiting him with using the urinal. The patient ate 100% of his lunch and drank 466ml of fluid. The patient has bedside table and call light within reach. Lacretia Leigh, SN, RCC.

## 2014-02-05 NOTE — Progress Notes (Signed)
He is afebrile and WBC is normal.  Renal function is improving.  PE:  Open left groin wound is not packed well as the 5 cm medial track is not packed.  I redressed the wound with RN present to demonstrate what need to be done.  Overall, the wound is closing up.  Assess:  Necrotic, infected left inguinal lymph node leading to abscess requiring incision and drainage-wound is slowly healing  Plan:  Continue current dressing changes.  Will recheck Monday if he is still here.

## 2014-02-06 LAB — TYPE AND SCREEN
ABO/RH(D): A POS
Antibody Screen: NEGATIVE
UNIT DIVISION: 0

## 2014-02-06 NOTE — Progress Notes (Signed)
   CARE MANAGEMENT NOTE 02/06/2014  Patient:  Douglas [name],Douglas [name]   Account Number:  [id number]  Date Initiated:  02/05/2014  Documentation initiated by:  McGIBBONEY,COOKIE  Subjective/Objective Assessment:   patient presents to [facility] with elevated potassium level and fatigue.     Action/Plan:   from home   Anticipated DC Date:  02/07/2014   Anticipated DC Plan:  HOME/SELF CARE         [facility] Choice  HOME HEALTH   Choice offered to / List presented to:  C-1 Patient        [place] arranged  HH-1 RN      [place].   Status of service:  Completed, signed off Medicare Important Message given?  NA - LOS <3 / Initial given by admissions (If response is "NO", the following Medicare IM given date fields will be blank) Date Medicare IM given:   Date Additional Medicare IM given:    Discharge Disposition:  [place]  Per UR Regulation:  Reviewed for med. necessity/level of care/duration of stay  If discussed at [place] of Stay Meetings, dates discussed:    Comments:  02/06/2014 1300 Received call from attending for [facility] RN for lab draws. Pt active with [facility]. NCM spoke to [facility] for resumption of care with labs ordered. Jonnie Finner RN CCM Case mgmt phone [phone]  02/05/14 MMCGIBBONEY, RN, BSN CHART REVIEWED.

## 2014-02-07 LAB — CULTURE, ROUTINE-ABSCESS

## 2014-02-08 ENCOUNTER — Other Ambulatory Visit: Payer: BC Managed Care – PPO

## 2014-02-09 ENCOUNTER — Telehealth: Payer: Self-pay | Admitting: Medical Oncology

## 2014-02-09 NOTE — Telephone Encounter (Signed)
I received a culture report from [facility]. I left voice message with [facility] to send the culture report from 02/03/14 to requesting  MD , Earnstine Regal and that this was not ordered by Dr Julien Nordmann.

## 2014-02-10 ENCOUNTER — Ambulatory Visit: Payer: BC Managed Care – PPO

## 2014-02-10 ENCOUNTER — Other Ambulatory Visit: Payer: BC Managed Care – PPO

## 2014-02-11 ENCOUNTER — Ambulatory Visit: Payer: BC Managed Care – PPO

## 2014-02-12 ENCOUNTER — Telehealth: Payer: Self-pay | Admitting: Medical Oncology

## 2014-02-12 NOTE — Telephone Encounter (Deleted)
Val in radiation called and said Dr Pablo Ledger recommended cancelling chemo 3/30. I left message for wife about this and to keep lab and [place] appointments on 3/3/0

## 2014-02-12 NOTE — Telephone Encounter (Signed)
Per dr Julien Nordmann I faxed labs to Dr Joelyn Oms office  . I called Dr Joelyn Oms nurse and asked MD to review pt creatinine and advise.

## 2014-02-15 ENCOUNTER — Telehealth (INDEPENDENT_AMBULATORY_CARE_PROVIDER_SITE_OTHER): Payer: Self-pay | Admitting: *Deleted

## 2014-02-15 ENCOUNTER — Other Ambulatory Visit: Payer: BC Managed Care – PPO

## 2014-02-15 NOTE — Telephone Encounter (Signed)
Alden Benjamin called regarding pt antibiotics. She was concerned that he is finishing it up and he didn't have another refill.  I advised her to have the pt finish the antibiotic and the pt has a f/u appt 4.6.15 and Dr. Zella Richer will advise at the appt.Marland KitchenMarland KitchenAnderson [place]

## 2014-02-16 ENCOUNTER — Ambulatory Visit (HOSPITAL_COMMUNITY)
Admission: RE | Admit: 2014-02-16 | Discharge: 2014-02-16 | Disposition: A | Discharge status: Home / Self Care | Payer: BC Managed Care – PPO | Source: Ambulatory Visit | Attending: Family Medicine | Admitting: Family Medicine

## 2014-02-16 ENCOUNTER — Other Ambulatory Visit (HOSPITAL_COMMUNITY): Payer: Self-pay | Admitting: Family Medicine

## 2014-02-16 DIAGNOSIS — R0600 Dyspnea, unspecified: Secondary | ICD-10-CM

## 2014-02-16 DIAGNOSIS — C8590 Non-Hodgkin lymphoma, unspecified, unspecified site: Secondary | ICD-10-CM

## 2014-02-16 DIAGNOSIS — R911 Solitary pulmonary nodule: Secondary | ICD-10-CM | POA: Insufficient documentation

## 2014-02-16 DIAGNOSIS — R0602 Shortness of breath: Secondary | ICD-10-CM | POA: Insufficient documentation

## 2014-02-16 DIAGNOSIS — C8589 Other specified types of non-Hodgkin lymphoma, extranodal and solid organ sites: Secondary | ICD-10-CM | POA: Insufficient documentation

## 2014-02-19 ENCOUNTER — Inpatient Hospital Stay (HOSPITAL_COMMUNITY): Payer: BC Managed Care – PPO

## 2014-02-19 ENCOUNTER — Encounter (HOSPITAL_COMMUNITY): Payer: Self-pay | Admitting: Emergency Medicine

## 2014-02-19 ENCOUNTER — Inpatient Hospital Stay (HOSPITAL_COMMUNITY)
Admission: EM | Admit: 2014-02-19 | Discharge: 2014-04-19 | DRG: 981 | Disposition: E | Payer: BC Managed Care – PPO | Attending: Pulmonary Disease | Admitting: Pulmonary Disease

## 2014-02-19 ENCOUNTER — Other Ambulatory Visit: Payer: Self-pay

## 2014-02-19 ENCOUNTER — Emergency Department (HOSPITAL_COMMUNITY): Payer: BC Managed Care – PPO

## 2014-02-19 DIAGNOSIS — E883 Tumor lysis syndrome: Secondary | ICD-10-CM

## 2014-02-19 DIAGNOSIS — Z860100 Personal history of colon polyps, unspecified: Secondary | ICD-10-CM

## 2014-02-19 DIAGNOSIS — L03319 Cellulitis of trunk, unspecified: Secondary | ICD-10-CM

## 2014-02-19 DIAGNOSIS — C8299 Follicular lymphoma, unspecified, extranodal and solid organ sites: Secondary | ICD-10-CM | POA: Diagnosis present

## 2014-02-19 DIAGNOSIS — I959 Hypotension, unspecified: Secondary | ICD-10-CM | POA: Diagnosis not present

## 2014-02-19 DIAGNOSIS — T8183XA Persistent postprocedural fistula, initial encounter: Secondary | ICD-10-CM | POA: Diagnosis present

## 2014-02-19 DIAGNOSIS — L02219 Cutaneous abscess of trunk, unspecified: Secondary | ICD-10-CM

## 2014-02-19 DIAGNOSIS — E119 Type 2 diabetes mellitus without complications: Secondary | ICD-10-CM | POA: Diagnosis present

## 2014-02-19 DIAGNOSIS — Z992 Dependence on renal dialysis: Secondary | ICD-10-CM

## 2014-02-19 DIAGNOSIS — N179 Acute kidney failure, unspecified: Principal | ICD-10-CM | POA: Diagnosis present

## 2014-02-19 DIAGNOSIS — I120 Hypertensive chronic kidney disease with stage 5 chronic kidney disease or end stage renal disease: Secondary | ICD-10-CM | POA: Diagnosis present

## 2014-02-19 DIAGNOSIS — C8590 Non-Hodgkin lymphoma, unspecified, unspecified site: Secondary | ICD-10-CM | POA: Diagnosis present

## 2014-02-19 DIAGNOSIS — N039 Chronic nephritic syndrome with unspecified morphologic changes: Secondary | ICD-10-CM

## 2014-02-19 DIAGNOSIS — R0989 Other specified symptoms and signs involving the circulatory and respiratory systems: Secondary | ICD-10-CM

## 2014-02-19 DIAGNOSIS — N186 End stage renal disease: Secondary | ICD-10-CM | POA: Diagnosis present

## 2014-02-19 DIAGNOSIS — E871 Hypo-osmolality and hyponatremia: Secondary | ICD-10-CM | POA: Diagnosis present

## 2014-02-19 DIAGNOSIS — D62 Acute posthemorrhagic anemia: Secondary | ICD-10-CM

## 2014-02-19 DIAGNOSIS — D638 Anemia in other chronic diseases classified elsewhere: Secondary | ICD-10-CM | POA: Diagnosis present

## 2014-02-19 DIAGNOSIS — A419 Sepsis, unspecified organism: Secondary | ICD-10-CM

## 2014-02-19 DIAGNOSIS — R2242 Localized swelling, mass and lump, left lower limb: Secondary | ICD-10-CM

## 2014-02-19 DIAGNOSIS — N19 Unspecified kidney failure: Secondary | ICD-10-CM | POA: Diagnosis not present

## 2014-02-19 DIAGNOSIS — E875 Hyperkalemia: Secondary | ICD-10-CM | POA: Diagnosis not present

## 2014-02-19 DIAGNOSIS — Y849 Medical procedure, unspecified as the cause of abnormal reaction of the patient, or of later complication, without mention of misadventure at the time of the procedure: Secondary | ICD-10-CM | POA: Diagnosis present

## 2014-02-19 DIAGNOSIS — I10 Essential (primary) hypertension: Secondary | ICD-10-CM | POA: Diagnosis present

## 2014-02-19 DIAGNOSIS — R6521 Severe sepsis with septic shock: Secondary | ICD-10-CM

## 2014-02-19 DIAGNOSIS — C8589 Other specified types of non-Hodgkin lymphoma, extranodal and solid organ sites: Secondary | ICD-10-CM

## 2014-02-19 DIAGNOSIS — A4902 Methicillin resistant Staphylococcus aureus infection, unspecified site: Secondary | ICD-10-CM | POA: Diagnosis present

## 2014-02-19 DIAGNOSIS — E785 Hyperlipidemia, unspecified: Secondary | ICD-10-CM | POA: Diagnosis present

## 2014-02-19 DIAGNOSIS — E872 Acidosis, unspecified: Secondary | ICD-10-CM | POA: Insufficient documentation

## 2014-02-19 DIAGNOSIS — K920 Hematemesis: Secondary | ICD-10-CM

## 2014-02-19 DIAGNOSIS — L280 Lichen simplex chronicus: Secondary | ICD-10-CM | POA: Diagnosis present

## 2014-02-19 DIAGNOSIS — D630 Anemia in neoplastic disease: Secondary | ICD-10-CM | POA: Diagnosis present

## 2014-02-19 DIAGNOSIS — K5900 Constipation, unspecified: Secondary | ICD-10-CM | POA: Diagnosis not present

## 2014-02-19 DIAGNOSIS — D631 Anemia in chronic kidney disease: Secondary | ICD-10-CM | POA: Diagnosis present

## 2014-02-19 DIAGNOSIS — S31104A Unspecified open wound of abdominal wall, left lower quadrant without penetration into peritoneal cavity, initial encounter: Secondary | ICD-10-CM | POA: Diagnosis present

## 2014-02-19 DIAGNOSIS — J690 Pneumonitis due to inhalation of food and vomit: Secondary | ICD-10-CM | POA: Diagnosis not present

## 2014-02-19 DIAGNOSIS — R34 Anuria and oliguria: Secondary | ICD-10-CM | POA: Diagnosis present

## 2014-02-19 DIAGNOSIS — D696 Thrombocytopenia, unspecified: Secondary | ICD-10-CM | POA: Diagnosis not present

## 2014-02-19 DIAGNOSIS — Z6841 Body Mass Index (BMI) 40.0 and over, adult: Secondary | ICD-10-CM

## 2014-02-19 DIAGNOSIS — R6520 Severe sepsis without septic shock: Secondary | ICD-10-CM

## 2014-02-19 DIAGNOSIS — IMO0002 Reserved for concepts with insufficient information to code with codable children: Secondary | ICD-10-CM | POA: Diagnosis present

## 2014-02-19 DIAGNOSIS — I2699 Other pulmonary embolism without acute cor pulmonale: Secondary | ICD-10-CM | POA: Diagnosis not present

## 2014-02-19 DIAGNOSIS — Z8249 Family history of ischemic heart disease and other diseases of the circulatory system: Secondary | ICD-10-CM

## 2014-02-19 DIAGNOSIS — G9340 Encephalopathy, unspecified: Secondary | ICD-10-CM | POA: Diagnosis not present

## 2014-02-19 DIAGNOSIS — G4733 Obstructive sleep apnea (adult) (pediatric): Secondary | ICD-10-CM | POA: Diagnosis present

## 2014-02-19 DIAGNOSIS — R0602 Shortness of breath: Secondary | ICD-10-CM

## 2014-02-19 DIAGNOSIS — L0390 Cellulitis, unspecified: Secondary | ICD-10-CM

## 2014-02-19 DIAGNOSIS — J9600 Acute respiratory failure, unspecified whether with hypoxia or hypercapnia: Secondary | ICD-10-CM

## 2014-02-19 DIAGNOSIS — K219 Gastro-esophageal reflux disease without esophagitis: Secondary | ICD-10-CM | POA: Diagnosis present

## 2014-02-19 DIAGNOSIS — Z515 Encounter for palliative care: Secondary | ICD-10-CM

## 2014-02-19 DIAGNOSIS — K922 Gastrointestinal hemorrhage, unspecified: Secondary | ICD-10-CM | POA: Diagnosis present

## 2014-02-19 LAB — BASIC METABOLIC PANEL
BUN: 158 mg/dL — ABNORMAL HIGH (ref 6–23)
BUN: 152 mg/dL — ABNORMAL HIGH (ref 6–23)
BUN: 151 mg/dL — ABNORMAL HIGH (ref 6–23)
CALCIUM: 11 mg/dL — AB (ref 8.4–10.5)
CHLORIDE: 82 meq/L — AB (ref 96–112)
CHLORIDE: 87 meq/L — AB (ref 96–112)
CO2: 11 meq/L — AB (ref 19–32)
CO2: 12 mEq/L — ABNORMAL LOW (ref 19–32)
CO2: 14 mEq/L — ABNORMAL LOW (ref 19–32)
CREATININE: 7.71 mg/dL — AB (ref 0.50–1.35)
Calcium: 11.5 mg/dL — ABNORMAL HIGH (ref 8.4–10.5)
Calcium: 10.5 mg/dL (ref 8.4–10.5)
Chloride: 85 mEq/L — ABNORMAL LOW (ref 96–112)
Creatinine, Ser: 7.9 mg/dL — ABNORMAL HIGH (ref 0.50–1.35)
Creatinine, Ser: 7.57 mg/dL — ABNORMAL HIGH (ref 0.50–1.35)
GFR calc Af Amer: 8 mL/min — ABNORMAL LOW (ref >90)
GFR calc Af Amer: 8 mL/min — ABNORMAL LOW (ref >90)
GFR calc non Af Amer: 7 mL/min — ABNORMAL LOW (ref >90)
GFR calc non Af Amer: 7 mL/min — ABNORMAL LOW (ref >90)
GFR calc non Af Amer: 7 mL/min — ABNORMAL LOW (ref >90)
GFR, EST AFRICAN AMERICAN: 8 mL/min — AB (ref >90)
Glucose, Bld: 187 mg/dL — ABNORMAL HIGH (ref 70–99)
Glucose, Bld: 104 mg/dL — ABNORMAL HIGH (ref 70–99)
Glucose, Bld: 151 mg/dL — ABNORMAL HIGH (ref 70–99)
POTASSIUM: 7.6 meq/L — AB (ref 3.7–5.3)
POTASSIUM: 7.2 meq/L — AB (ref 3.7–5.3)
Potassium: 7.1 mEq/L (ref 3.7–5.3)
SODIUM: 122 meq/L — AB (ref 137–147)
SODIUM: 124 meq/L — AB (ref 137–147)
Sodium: 127 mEq/L — ABNORMAL LOW (ref 137–147)

## 2014-02-19 LAB — CBC WITH DIFFERENTIAL/PLATELET
BASOS ABS: 0 10*3/uL (ref 0.0–0.1)
Basophils Relative: 0 % (ref 0–1)
Eosinophils Absolute: 0 10*3/uL (ref 0.0–0.7)
Eosinophils Relative: 0 % (ref 0–5)
HCT: 28.4 % — ABNORMAL LOW (ref 39.0–52.0)
Hemoglobin: 9.8 g/dL — ABNORMAL LOW (ref 13.0–17.0)
LYMPHS PCT: 7 % — AB (ref 12–46)
Lymphs Abs: 1.1 10*3/uL (ref 0.7–4.0)
MCH: 28.5 pg (ref 26.0–34.0)
MCHC: 34.5 g/dL (ref 30.0–36.0)
MCV: 82.6 fL (ref 78.0–100.0)
Monocytes Absolute: 0.7 10*3/uL (ref 0.1–1.0)
Monocytes Relative: 5 % (ref 3–12)
NEUTROS ABS: 12.8 10*3/uL — AB (ref 1.7–7.7)
NEUTROS PCT: 88 % — AB (ref 43–77)
PLATELETS: 256 10*3/uL (ref 150–400)
RBC: 3.44 MIL/uL — ABNORMAL LOW (ref 4.22–5.81)
RDW: 16.4 % — AB (ref 11.5–15.5)
WBC: 14.6 10*3/uL — ABNORMAL HIGH (ref 4.0–10.5)

## 2014-02-19 LAB — URIC ACID: URIC ACID, SERUM: 14.3 mg/dL — AB (ref 4.0–7.8)

## 2014-02-19 LAB — URINALYSIS, ROUTINE W REFLEX MICROSCOPIC
Bilirubin Urine: NEGATIVE
Glucose, UA: NEGATIVE mg/dL
HGB URINE DIPSTICK: NEGATIVE
Ketones, ur: NEGATIVE mg/dL
Nitrite: NEGATIVE
PROTEIN: 100 mg/dL — AB
Specific Gravity, Urine: 1.019 (ref 1.005–1.030)
UROBILINOGEN UA: 0.2 mg/dL (ref 0.0–1.0)
pH: 5 (ref 5.0–8.0)

## 2014-02-19 LAB — CREATININE, URINE, RANDOM: CREATININE, URINE: 292.3 mg/dL

## 2014-02-19 LAB — PROTIME-INR
INR: 1.21 (ref 0.00–1.49)
Prothrombin Time: 15 seconds (ref 11.6–15.2)

## 2014-02-19 LAB — HEPARIN LEVEL (UNFRACTIONATED): HEPARIN UNFRACTIONATED: 1.3 [IU]/mL — AB (ref 0.30–0.70)

## 2014-02-19 LAB — URINE MICROSCOPIC-ADD ON

## 2014-02-19 LAB — MRSA PCR SCREENING: MRSA by PCR: POSITIVE — AB

## 2014-02-19 LAB — GLUCOSE, CAPILLARY
Glucose-Capillary: 124 mg/dL — ABNORMAL HIGH (ref 70–99)
Glucose-Capillary: 133 mg/dL — ABNORMAL HIGH (ref 70–99)

## 2014-02-19 LAB — PHOSPHORUS: PHOSPHORUS: 8.4 mg/dL — AB (ref 2.3–4.6)

## 2014-02-19 LAB — D-DIMER, QUANTITATIVE (NOT AT ARMC): D DIMER QUANT: 13.9 ug{FEU}/mL — AB (ref 0.00–0.48)

## 2014-02-19 LAB — LACTATE DEHYDROGENASE: LDH: 444 U/L — ABNORMAL HIGH (ref 94–250)

## 2014-02-19 LAB — CK TOTAL AND CKMB (NOT AT ARMC)
CK, MB: 1.4 ng/mL (ref 0.3–4.0)
RELATIVE INDEX: INVALID (ref 0.0–2.5)
Total CK: 40 U/L (ref 7–232)

## 2014-02-19 LAB — TROPONIN I

## 2014-02-19 LAB — PRO B NATRIURETIC PEPTIDE: Pro B Natriuretic peptide (BNP): 937.1 pg/mL — ABNORMAL HIGH (ref 0–125)

## 2014-02-19 MED ORDER — MUPIROCIN 2 % EX OINT
1.0000 "application " | TOPICAL_OINTMENT | Freq: Two times a day (BID) | CUTANEOUS | Status: AC
  Administered 2014-02-20 – 2014-02-24 (×10): 1 via NASAL
  Filled 2014-02-19: qty 22

## 2014-02-19 MED ORDER — HEPARIN (PORCINE) IN NACL 100-0.45 UNIT/ML-% IJ SOLN
2300.0000 [IU]/h | INTRAMUSCULAR | Status: DC
  Administered 2014-02-19 (×2): 2300 [IU]/h via INTRAVENOUS
  Filled 2014-02-19 (×2): qty 250

## 2014-02-19 MED ORDER — PRISMASOL BGK 0/2.5 32-2.5 MEQ/L IV SOLN
INTRAVENOUS | Status: DC
  Administered 2014-02-19 – 2014-02-20 (×4): via INTRAVENOUS_CENTRAL
  Filled 2014-02-19 (×10): qty 5000

## 2014-02-19 MED ORDER — SODIUM CHLORIDE 0.9 % IJ SOLN
3.0000 mL | Freq: Two times a day (BID) | INTRAMUSCULAR | Status: DC
  Administered 2014-02-21 – 2014-03-24 (×40): 3 mL via INTRAVENOUS

## 2014-02-19 MED ORDER — VANCOMYCIN HCL 10 G IV SOLR
1500.0000 mg | INTRAVENOUS | Status: DC
  Administered 2014-02-20 – 2014-02-21 (×2): 1500 mg via INTRAVENOUS
  Filled 2014-02-19 (×3): qty 1500

## 2014-02-19 MED ORDER — HEPARIN SODIUM (PORCINE) 1000 UNIT/ML DIALYSIS
1000.0000 [IU] | INTRAMUSCULAR | Status: DC | PRN
  Administered 2014-02-19: 2400 [IU] via INTRAVENOUS_CENTRAL
  Filled 2014-02-19 (×2): qty 6

## 2014-02-19 MED ORDER — SODIUM CHLORIDE 0.9 % IV SOLN
1.0000 g | Freq: Once | INTRAVENOUS | Status: AC
  Administered 2014-02-19: 1 g via INTRAVENOUS
  Filled 2014-02-19: qty 10

## 2014-02-19 MED ORDER — SODIUM CHLORIDE 0.9 % IV SOLN
Freq: Once | INTRAVENOUS | Status: AC
  Administered 2014-02-19: 14:00:00 via INTRAVENOUS

## 2014-02-19 MED ORDER — PRISMASOL BGK 0/2.5 32-2.5 MEQ/L IV SOLN
INTRAVENOUS | Status: DC
  Administered 2014-02-19: 20:00:00 via INTRAVENOUS_CENTRAL
  Filled 2014-02-19 (×2): qty 5000

## 2014-02-19 MED ORDER — SODIUM CHLORIDE 0.9 % IV SOLN: 6.0000 mg | Freq: Once | INTRAVENOUS | Status: DC

## 2014-02-19 MED ORDER — STERILE WATER FOR INJECTION IV SOLN
INTRAVENOUS | Status: DC
  Administered 2014-02-19 – 2014-02-20 (×5): via INTRAVENOUS_CENTRAL
  Filled 2014-02-19 (×12): qty 150

## 2014-02-19 MED ORDER — DEXTROSE 50 % IV SOLN
1.0000 | Freq: Once | INTRAVENOUS | Status: AC
  Administered 2014-02-19: 50 mL via INTRAVENOUS
  Filled 2014-02-19: qty 50

## 2014-02-19 MED ORDER — HYDROCORTISONE NA SUCCINATE PF 100 MG IJ SOLR
50.0000 mg | Freq: Four times a day (QID) | INTRAMUSCULAR | Status: DC
  Administered 2014-02-19 – 2014-02-22 (×12): 50 mg via INTRAVENOUS
  Filled 2014-02-19 (×6): qty 1
  Filled 2014-02-19: qty 2
  Filled 2014-02-19 (×9): qty 1

## 2014-02-19 MED ORDER — HEPARIN (PORCINE) 2000 UNITS/L FOR CRRT
INTRAVENOUS_CENTRAL | Status: DC | PRN
  Filled 2014-02-19: qty 1000

## 2014-02-19 MED ORDER — SODIUM POLYSTYRENE SULFONATE 15 GM/60ML PO SUSP
30.0000 g | Freq: Four times a day (QID) | ORAL | Status: DC
  Administered 2014-02-19: 30 g via ORAL
  Filled 2014-02-19 (×2): qty 120

## 2014-02-19 MED ORDER — VANCOMYCIN HCL 10 G IV SOLR
2000.0000 mg | Freq: Once | INTRAVENOUS | Status: AC
  Administered 2014-02-19: 2000 mg via INTRAVENOUS
  Filled 2014-02-19: qty 2000

## 2014-02-19 MED ORDER — SODIUM POLYSTYRENE SULFONATE 15 GM/60ML PO SUSP
30.0000 g | Freq: Once | ORAL | Status: AC
  Administered 2014-02-19: 30 g via ORAL
  Filled 2014-02-19: qty 120

## 2014-02-19 MED ORDER — CHLORHEXIDINE GLUCONATE CLOTH 2 % EX PADS
6.0000 | MEDICATED_PAD | Freq: Every day | CUTANEOUS | Status: AC
  Administered 2014-02-20 – 2014-02-24 (×5): 6 via TOPICAL

## 2014-02-19 MED ORDER — HEPARIN BOLUS VIA INFUSION
4000.0000 [IU] | Freq: Once | INTRAVENOUS | Status: AC
  Administered 2014-02-19: 4000 [IU] via INTRAVENOUS
  Filled 2014-02-19: qty 4000

## 2014-02-19 MED ORDER — SODIUM CHLORIDE 0.9 % IV SOLN
INTRAVENOUS | Status: DC
  Administered 2014-02-19 (×2): via INTRAVENOUS

## 2014-02-19 MED ORDER — INSULIN ASPART 100 UNIT/ML ~~LOC~~ SOLN
10.0000 [IU] | Freq: Once | SUBCUTANEOUS | Status: AC
  Administered 2014-02-19: 10 [IU] via INTRAVENOUS
  Filled 2014-02-19: qty 1

## 2014-02-19 NOTE — Progress Notes (Signed)
ANTIBIOTIC CONSULT NOTE - INITIAL  Pharmacy Consult for vanc Indication: Draining wound  No Known Allergies  Patient Measurements:   Adjusted Body Weight:   Vital Signs: Temp: 98.1 F (36.7 C) (04/03 1900) Temp src: Oral (04/03 1900) BP: 148/78 mmHg (04/03 2100) Pulse Rate: 88 (04/03 2100) Intake/Output from previous day:   Intake/Output from this shift: Total I/O In: 296 [I.V.:296] Out: 77 [Urine:13; Other:64]  Labs:  Recent Labs  03/13/2014 1220 02/23/2014 1500 02/27/2014 1545  WBC 14.6*  --   --   HGB 9.8*  --   --   PLT 256  --   --   LABCREA  --   --  292.3  CREATININE 7.90* 7.71*  --    The CrCl is unknown because both a height and weight (above a minimum accepted value) are required for this calculation. No results found for this basename: VANCOTROUGH, VANCOPEAK, VANCORANDOM, GENTTROUGH, GENTPEAK, GENTRANDOM, TOBRATROUGH, TOBRAPEAK, TOBRARND, AMIKACINPEAK, AMIKACINTROU, AMIKACIN,  in the last 72 hours   Microbiology: Recent Results (from the past 720 hour(s))  CULTURE, ROUTINE-ABSCESS     Status: None   Collection Time    02/03/14 12:11 PM      Result Value Ref Range Status   Specimen Description ABSCESS LEFT GROIN   Final   Special Requests NONE   Final   Gram Stain     Final   Value: ABUNDANT WBC PRESENT,BOTH PMN AND MONONUCLEAR     NO SQUAMOUS EPITHELIAL CELLS SEEN     ABUNDANT GRAM POSITIVE COCCI IN PAIRS     MODERATE GRAM NEGATIVE RODS     Performed at [facility]   Culture     Final   Value: MODERATE ESCHERICHIA COLI     MODERATE METHICILLIN RESISTANT STAPHYLOCOCCUS AUREUS     Note: RIFAMPIN AND GENTAMICIN SHOULD NOT BE USED AS SINGLE DRUGS FOR TREATMENT OF STAPH INFECTIONS.     Performed at [facility]   Report Status 02/07/2014 FINAL   Final   Organism ID, Bacteria ESCHERICHIA COLI   Final   Organism ID, Bacteria METHICILLIN RESISTANT STAPHYLOCOCCUS AUREUS   Final    Medical History: Past Medical History  Diagnosis Date  .  Non Hodgkin's lymphoma January 2009  . Diabetes mellitus without complication   . Hypertension   . Sleep apnea   . Lymphadenopathy, inguinal     left    Medications:  Prescriptions prior to admission  Medication Sig Dispense Refill  . Alogliptin-Pioglitazone (OSENI) 25-15 MG TABS Take 1 tablet by mouth daily.      [name] docusate sodium (COLACE) 100 MG capsule Take 100 mg by mouth 2 (two) times daily.      . Ferrous Sulfate (IRON) 325 (65 FE) MG TABS Take 325 mg by mouth 2 (two) times daily.  60 each  0  . Hydrocortisone-Aloe Vera (CORTIZONE-10 INTENSIVE HEALING) 1 % CREA Apply 1 application topically as needed (For itching).      [name] omeprazole (PRILOSEC) 40 MG capsule Take 1 capsule (40 mg total) by mouth 2 (two) times daily.  60 capsule  0  . oxyCODONE-acetaminophen (PERCOCET) 10-325 MG per tablet Take 1 tablet by mouth every 6 (six) hours as needed for pain.  60 tablet  0  . polyethylene glycol (MIRALAX / GLYCOLAX) packet Take 17 g by mouth daily as needed for mild constipation.       . prochlorperazine (COMPAZINE) 10 MG tablet Take 1 tablet (10 mg total) by mouth every 6 (six) hours  as needed for nausea or vomiting.  60 tablet  0   Scheduled:  . hydrocortisone sod succinate (SOLU-CORTEF) inj  50 mg Intravenous Q6H  . sodium chloride  3 mL Intravenous Q12H  . sodium polystyrene  30 g Oral Q6H   Infusions:  . sodium chloride 125 mL/hr at 02/18/2014 1452  . heparin 2,300 Units (03/14/2014 1900)  . dialysis replacement fluid (prismasate) 200 mL/hr at 03/18/2014 2011  . dialysate (PRISMASATE) 1,500 mL/hr at 03/15/2014 2041  . 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 He is in ARF right now. According to note pt has been on abx for several weeks but not sure what. Vanc is starting for draining wound. He is on CRRT now. Will give him a load and start q24h dosing.   Goal of Therapy:  Vancomycin  trough level 15-20 mcg/ml  Plan:   Vanc 2g IV x1 then 1.5g IV q24 Will get level if needed

## 2014-02-19 NOTE — Progress Notes (Addendum)
ANTICOAGULATION CONSULT NOTE - Initial Consult  Pharmacy Consult for:  IV Heparin Indication:  Pulmonary embolism  No Known Allergies  Patient Measurements: 02/05/14 - Height 70 inches   Weight 183.4 kg   Vital Signs: Temp: 97.7 F (36.5 C) (04/03 1200) Temp src: Oral (04/03 1200) BP: 105/63 mmHg (04/03 1200) Pulse Rate: 108 (04/03 1200)  Labs:  Recent Labs  02/22/2014 1220  HGB 9.8*  HCT 28.4*  PLT 256  LABPROT 15.0  INR 1.21  CREATININE 7.90*  TROPONINI <0.30    Medical History: Past Medical History  Diagnosis Date  . Non Hodgkin's lymphoma January 2009  . Diabetes mellitus without complication   . Hypertension   . Sleep apnea   . Lymphadenopathy, inguinal     left    Medications:  Pending  Assessment:  Asked to assist with IV Heparin therapy for this 58 year-old male with elevated D-dimer and presumed pulmonary embolism.  Admission PT/INR is within the normal range at 15/1.21.  Problem list includes elevated serum K,  non-Hodgkin's lymphoma (last chemotherapy March 2015), left inguinal wound, and acute kidney injury.  Goal of Therapy:   Heparin level 0.3-0.7 units/ml  Monitor platelets by anticoagulation protocol: Yes   Plan:   Heparin 4000 units IV as loading dose, then infusion at 2300 units/hr.  Heparin level 8 hours after starting the infusion.  Heparin level and CBC daily.  ClaytonPh. 03/16/2014 2:46 PM

## 2014-02-19 NOTE — ED Provider Notes (Signed)
CSN: [id number]     Arrival date & time 21-Feb-2014  1116 History   First MD Initiated Contact with Patient 02/21/2014 1145     Chief Complaint  Patient presents with  . Shortness of Breath     (Consider location/radiation/quality/duration/timing/severity/associated sxs/prior Treatment) HPI  This is a 58 year old male with a history of morbid obesity, non-Hodgkin's lymphoma, diabetes, hypertension, and recent admission for hyperkalemia and acute kidney injury who presents with shortness of breath. Patient reports 10 days of progressive shortness of breath. He reports dyspnea on exertion. He denies any chest pain. He was seen by his primary physician today and referred to the ER. He denies any fevers or cough. He denies any history of blood clots. He has history of bilateral lower extremity edema left greater than right. He reports worsening edema and left lower extremity pain. Patient also reports healing left inguinal wound. He states that he thinks "that's getting better." He is currently on doxycycline.  Patient also reports decreased urine output with 2-3 voids daily.  Past Medical History  Diagnosis Date  . Non Hodgkin's lymphoma January 2009  . Diabetes mellitus without complication   . Hypertension   . Sleep apnea   . Lymphadenopathy, inguinal     left   Past Surgical History  Procedure Laterality Date  . Biopsy for lymphoma Left 11/02/13    groin  . Colonoscopy N/A 12/22/2013    Procedure: COLONOSCOPY;  Surgeon: Beryle Beams, MD;  Location: [facility];  Service: Endoscopy;  Laterality: N/A;  . Esophagogastroduodenoscopy N/A 12/22/2013    Procedure: ESOPHAGOGASTRODUODENOSCOPY (EGD);  Surgeon: Beryle Beams, MD;  Location: [facility];  Service: Endoscopy;  Laterality: N/A;  . Ercp N/A 12/24/2013    Procedure: UPPER ENDOSCOPY;  Surgeon: Beryle Beams, MD;  Location: [facility];  Service: Gastroenterology;  Laterality: N/A;  . Colonoscopy N/A 12/24/2013    Procedure: COLONOSCOPY;   Surgeon: Beryle Beams, MD;  Location: [facility];  Service: Gastroenterology;  Laterality: N/A;   No family history on file. History  Substance Use Topics  . Smoking status: Never Smoker   . Smokeless tobacco: Never Used  . Alcohol Use: No    Review of Systems  Constitutional: Negative.  Negative for fever and chills.  Respiratory: Positive for shortness of breath. Negative for cough and chest tightness.   Cardiovascular: Positive for leg swelling. Negative for chest pain.  Gastrointestinal: Negative.  Negative for nausea, vomiting and abdominal pain.  Genitourinary: Negative.  Negative for dysuria.       Decreased urine output  Musculoskeletal: Negative for back pain.  Skin: Positive for wound. Negative for color change.  Neurological: Negative for headaches.  All other systems reviewed and are negative.      Allergies  Review of patient's allergies indicates no known allergies.  Home Medications   Current Outpatient Rx  Name  Route  Sig  Dispense  Refill  . Alogliptin-Pioglitazone (OSENI) 25-15 MG TABS   Oral   Take 1 tablet by mouth daily.         [name] atorvastatin (LIPITOR) 20 MG tablet   Oral   Take 20 mg by mouth daily.         [name] docusate sodium (COLACE) 100 MG capsule   Oral   Take 100 mg by mouth 2 (two) times daily.         . Ferrous Sulfate (IRON) 325 (65 FE) MG TABS   Oral   Take 325 mg by mouth 2 (two)  times daily.   60 each   0   . Hydrocortisone-Aloe Vera (CORTIZONE-10 INTENSIVE HEALING) 1 % CREA   Apply externally   Apply 1 application topically as needed (For itching).         [name] omeprazole (PRILOSEC) 40 MG capsule   Oral   Take 1 capsule (40 mg total) by mouth 2 (two) times daily.   60 capsule   0   . oxyCODONE-acetaminophen (PERCOCET) 10-325 MG per tablet   Oral   Take 1 tablet by mouth every 6 (six) hours as needed for pain.   60 tablet   0   . polyethylene glycol (MIRALAX / GLYCOLAX) packet   Oral   Take 17 g by mouth daily as  needed for mild constipation.          . prochlorperazine (COMPAZINE) 10 MG tablet   Oral   Take 1 tablet (10 mg total) by mouth every 6 (six) hours as needed for nausea or vomiting.   60 tablet   0    BP 85/43  Pulse 105  Temp(Src) 97.7 F (36.5 C) (Oral)  Resp 22  SpO2 99% Physical Exam  Nursing note and vitals reviewed. Constitutional: He is oriented to person, place, and time. No distress.  Obese  HENT:  Head: Normocephalic and atraumatic.  Mouth/Throat: Oropharynx is clear and moist.  Eyes: Pupils are equal, round, and reactive to light.  Neck: Neck supple. No JVD present.  Cardiovascular: Regular rhythm and normal heart sounds.   No murmur heard. Tachycardia  Pulmonary/Chest: Effort normal and breath sounds normal. No respiratory distress. He has no wheezes.  Limited by body habitus  Abdominal: Soft. Bowel sounds are normal. There is no tenderness. There is no rebound.  Musculoskeletal: He exhibits edema.  3+ bilateral lower extremity pitting edema, no significant overlying erythema or warmth, venous stasis changes bilaterally  Neurological: He is alert and oriented to person, place, and time.  Skin: Skin is warm and dry.  Left inguinal wound with small amount of drainage noted, no surrounding erythema, granulation tissue noted  Psychiatric: He has a normal mood and affect.    ED Course  Procedures (including critical care time)  CRITICAL CARE Performed by: Thayer Jew, F   Total critical care time: 55 min  Critical care time was exclusive of separately billable procedures and treating other patients.  Critical care was necessary to treat or prevent imminent or life-threatening deterioration.  Critical care was time spent personally by me on the following activities: development of treatment plan with patient and/or surrogate as well as nursing, discussions with consultants, evaluation of patient's response to treatment, examination of patient,  obtaining history from patient or surrogate, ordering and performing treatments and interventions, ordering and review of laboratory studies, ordering and review of radiographic studies, pulse oximetry and re-evaluation of patient's condition.  Angiocath insertion Performed by: Thayer Jew, F  Consent: Verbal consent obtained. Risks and benefits: risks, benefits and alternatives were discussed Time out: Immediately prior to procedure a "time out" was called to verify the correct patient, procedure, equipment, support staff and site/side marked as required.  Preparation: Patient was prepped and draped in the usual sterile fashion.  Vein Location: l basilic  Ultrasound Guided  Gauge: 20  Normal blood return and flush without difficulty Patient tolerance: Patient tolerated the procedure well with no immediate complications.     Labs Review Labs Reviewed  CBC WITH DIFFERENTIAL - Abnormal; Notable for the following:    WBC 14.6 (*)  RBC 3.44 (*)    Hemoglobin 9.8 (*)    HCT 28.4 (*)    RDW 16.4 (*)    Neutrophils Relative % 88 (*)    Neutro Abs 12.8 (*)    Lymphocytes Relative 7 (*)    All other components within normal limits  BASIC METABOLIC PANEL - Abnormal; Notable for the following:    Sodium 122 (*)    Potassium 7.6 (*)    Chloride 82 (*)    CO2 11 (*)    Glucose, Bld 187 (*)    BUN 158 (*)    Creatinine, Ser 7.90 (*)    Calcium 11.5 (*)    GFR calc non Af Amer 7 (*)    GFR calc Af Amer 8 (*)    All other components within normal limits  D-DIMER, QUANTITATIVE - Abnormal; Notable for the following:    D-Dimer, Quant 13.90 (*)    All other components within normal limits  PRO B NATRIURETIC PEPTIDE - Abnormal; Notable for the following:    Pro B Natriuretic peptide (BNP) 937.1 (*)    All other components within normal limits  URINALYSIS, ROUTINE W REFLEX MICROSCOPIC - Abnormal; Notable for the following:    APPearance CLOUDY (*)    Protein, ur 100 (*)     Leukocytes, UA SMALL (*)    All other components within normal limits  URINE MICROSCOPIC-ADD ON - Abnormal; Notable for the following:    Squamous Epithelial / LPF FEW (*)    Casts HYALINE CASTS (*)    All other components within normal limits  TROPONIN I  PROTIME-INR  LACTATE DEHYDROGENASE  URIC ACID  PHOSPHORUS  BASIC METABOLIC PANEL  HEPARIN LEVEL (UNFRACTIONATED)   Imaging Review Dg Chest Portable 1 View  03-11-2014   CLINICAL DATA:  Shortness of breath. Non-Hodgkin's lymphoma. Diabetes and hypertension.  EXAM: PORTABLE CHEST - 1 VIEW  COMPARISON:  DG CHEST 2 VIEW dated 02/16/2014; DG CHEST 1 VIEW dated 02/03/2014; CT CHEST W/CM dated 12/02/2013  FINDINGS: The heart is mildly enlarged. Right middle lobe nodule appears larger, now measuring 2.7 cm. Probable right upper lobe nodule is only partially seen, measuring approximately 1 cm in diameter radiographically. Lesion was larger based on prior CT evaluation. There is slight prominence the left hilar region but this is felt to be stable compared with prior radiographs and CT. No new consolidation or pleural effusion.  IMPRESSION: Persistent right lung nodules. Right middle lobe nodule is larger ring compared prior studies.   Electronically Signed   By: Shon Hale M.D.   On: 11-Mar-2014 13:08     EKG Interpretation   Date/Time:  March 11, 2014 11:38:53 EDT Ventricular Rate:  121 PR Interval:  175 QRS Duration: 95 QT Interval:  290 QTC Calculation: 411 R Axis:   -58 Text Interpretation:  Sinus tachycardia Left anterior fascicular block  Abnormal R-wave progression, early transition Prominent T waves Confirmed  by HORTON  MD, COURTNEY ([id number]) on 03/11/14 12:07:58 PM      MDM   Final diagnoses:  Hyperkalemia  SOB (shortness of breath)  AKI (acute kidney injury)  Suspected pulmonary embolism    Patient presents with acute shortness of breath. He is morbidly obese and has diffuse lower extremity edema. Noted to be  tachycardic to 120 with a blood pressure of 100/55 on initial evaluation. Patient has a history of lymphoma. Concern for acute DVT. Dimer sent. Lab work initiated as well.  Workup notable for leukocytosis to 14.6, hyponatremia  with a sodium of 122, hyperkalemia with a potassium of 7.6, and acute kidney injury with a creatinine of 7.9 (1.9 at d/c), elevated BNP 937 and a d-dimer of 13.9. Patient given calcium gluconate, insulin, glucose, and Kayexalate for hyperkalemia. Discuss with Dr. Lorrene Reid.  Patient will not sit on VQ scan table at [facility] long.  Given possible need for dialysis and need for further evaluation for PE, will transfer to [facility]. Patient was empirically started on heparin. Tumor lysis labs were sent given acute rising creatinine. Foley catheter was placed. IV access was obtained. Patient was given rehydration giving concern for tumor lysis syndrome.  Discussed with Dr. Verlon Au.    Merryl Hacker, MD 03/10/2014 1520

## 2014-02-19 NOTE — H&P (Signed)
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   SIGNIFICANT EVENTS / STUDIES:  4/3 - Hyperkalemia, hyperuricemia, hyperphosphatemia and volume overload with ARF requiring emergent HD. UA with urate crystals. Renal US normal. No pulmonary edema on CXR. Heparin gtt started due to concern for PE.    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 He reports dyspnea is worse with activity and can occur at rest with chest pain and palpitations. Pt reports receiving chemotherapy about 1-2 months ago (rituximab and bendamustine) with no complications. He was recently hospitalized from 3/17-3/20 for AKI/hyperkalemia thought to be due ARB use versus hemodynamics that improved with IV fluids (Cr on discharge was 1.95).   He reports good urine output until today. He reports left LE pain and edema since December. He denies fever, chills, nausea, or emesis. He does reports lower abdominal pain, night sweating, hiccups (today),  constipation, weight change, decreased appetite, weakness, and fatigue. He denies NSAID usage.    Pt was found to be hyperkalemic (7.6) with Cr of 7.9 on admission. Nephrology was consulted and pt to undergo emergent HD (CRRT) due to anuric acute renal failure with volume overload and hyperkalemia.  Renal US was normal. Etiology thought to be due to ATN vs tumor lysis syndrome. Pt received chemotherapy with Bendamustine 90 mg/M2 on days 1 and 2 in addition to Rituxan 375 mg/M2 on day 1 every 4 weeks, first dose was on 12/08/2013 (status post 3 cycles). Last session was March 18.  Heparin drip started due to concern for PE in setting of malignancy and unilateral left LE edema and pain.      PAST MEDICAL HISTORY :  Past Medical History  Diagnosis Date  . Non Hodgkin's lymphoma January 2009  . Diabetes mellitus without complication   . Hypertension   . Sleep apnea   . Lymphadenopathy, inguinal     left   Past Surgical History  Procedure Laterality Date  . Biopsy for lymphoma Left 11/02/13    groin  . Colonoscopy N/A 12/22/2013    Procedure: COLONOSCOPY;  Surgeon: Beryle Beams, MD;  Location: [facility];  Service: Endoscopy;  Laterality: N/A;  . Esophagogastroduodenoscopy N/A 12/22/2013    Procedure: ESOPHAGOGASTRODUODENOSCOPY (EGD);  Surgeon: Beryle Beams, MD;  Location: [facility];  Service: Endoscopy;  Laterality: N/A;  . Ercp N/A 12/24/2013    Procedure: UPPER ENDOSCOPY;  Surgeon: Beryle Beams, MD;  Location: [facility];  Service: Gastroenterology;  Laterality: N/A;  . Colonoscopy N/A 12/24/2013    Procedure: COLONOSCOPY;  Surgeon: Saralyn Pilar  Renee Ramus, MD;  Location: [facility];  Service: Gastroenterology;  Laterality: N/A;   Prior to Admission medications   Medication Sig Start Date End Date Taking? Authorizing Provider  Alogliptin-Pioglitazone (OSENI) 25-15 MG TABS Take 1 tablet by mouth daily.   Yes Historical Provider, MD  docusate sodium (COLACE) 100 MG capsule Take 100 mg by mouth 2 (two)  times daily.   Yes Historical Provider, MD  Ferrous Sulfate (IRON) 325 (65 FE) MG TABS Take 325 mg by mouth 2 (two) times daily. 12/26/13  Yes Adeline Saralyn Pilar, MD  Hydrocortisone-Aloe Vera (CORTIZONE-10 INTENSIVE HEALING) 1 % CREA Apply 1 application topically as needed (For itching).   Yes Historical Provider, MD  omeprazole (PRILOSEC) 40 MG capsule Take 1 capsule (40 mg total) by mouth 2 (two) times daily. 12/26/13  Yes Adeline Saralyn Pilar, MD  oxyCODONE-acetaminophen (PERCOCET) 10-325 MG per tablet Take 1 tablet by mouth every 6 (six) hours as needed for pain. 01/12/14  Yes Adrena E Johnson, PA-C  polyethylene glycol (MIRALAX / GLYCOLAX) packet Take 17 g by mouth daily as needed for mild constipation.    Yes Historical Provider, MD  prochlorperazine (COMPAZINE) 10 MG tablet Take 1 tablet (10 mg total) by mouth every 6 (six) hours as needed for nausea or vomiting. 12/01/13  Yes [name], MD   No Known Allergies  FAMILY HISTORY:  Family History  Problem Relation Age of Onset  . CAD Mother 84    Died at age 71  . CAD Father    SOCIAL HISTORY:  reports that he has never smoked. He has never used smokeless tobacco. He reports that he does not drink alcohol or use illicit drugs.  REVIEW OF SYSTEMS:    Constitutional: +fatigue + weight change .  Respiratory: + dyspnea + hiccups Cardiovascular: + chest pain + palpitations + left unilateral swelling   Gastrointestinal: + lower abdomina pain  + dysphagia  +constipation   Genitourinary: + decreased urine output   Skin: + left inguinal node drainage     VITAL SIGNS: Temp:  [97.7 F (36.5 C)-98.4 F (36.9 C)] 97.7 F (36.5 C) (04/03 1200) Pulse Rate:  [96-120] 96 (04/03 1730) Resp:  [18-27] 27 (04/03 1730) BP: (85-116)/(43-63) 116/58 mmHg (04/03 1730) SpO2:  [98 %-100 %] 100 % (04/03 1730) HEMODYNAMICS:   VENTILATOR SETTINGS:   INTAKE / OUTPUT: Intake/Output   None     PHYSICAL EXAMINATION: General: NAD, morbidly obese Neuro:  Moving extremities, normal strength   HEENT:  Normocephalic  Cardiovascular:  Tachycardic  Lungs: clear to ausculation anteriorly  Abdomen: lower abdominal tenderness, normal BS, no guarding Musculoskeletal:  Left +3 LE pitting edema up to thigh Skin:  Left groin wound   LABS:  CBC  Recent Labs Lab 03/07/2014 1220  WBC 14.6*  HGB 9.8*  HCT 28.4*  PLT 256   Coag's  Recent Labs Lab 03/04/2014 1220  INR 1.21   BMET  Recent Labs Lab 02/24/2014 1220 03/12/2014 1500  NA 122* 127*  K 7.6* 7.1*  CL 82* 87*  CO2 11* 12*  BUN 158* 152*  CREATININE 7.90* 7.71*  GLUCOSE 187* 104*   Electrolytes  Recent Labs Lab 02/22/2014 1220 03/14/2014 1500  CALCIUM 11.5* 11.0*  PHOS  --  8.4*   Cardiac Enzymes  Recent Labs Lab 03/11/2014 1220  TROPONINI <0.30  PROBNP 937.1*   Glucose  Recent Labs Lab 02/21/2014 1707  GLUCAP 124*    Imaging US Renal  02/26/2014   CLINICAL DATA:  Evaluate  for hydronephrosis  EXAM: RENAL/URINARY TRACT ULTRASOUND COMPLETE  COMPARISON:  US RENAL dated 02/03/2014; [place] ART/VEN FLOW ABD PELV DOPPLER dated 11/27/2013; US SCROTUM dated 11/27/2013  FINDINGS: Right Kidney:  Length: 11.5 cm. Echogenicity within normal limits. No mass or hydronephrosis visualized.  Left Kidney:  Length: 12.2 cm. Echogenicity within normal limits. No mass or hydronephrosis visualized.  Bladder:  Urinary bladder is decompressed, patient is status post Foley catheter insertion.  IMPRESSION: Negative renal ultrasound.   Electronically Signed   By: Margaree Mackintosh M.D.   On: 03/05/2014 16:09   Dg Chest Portable 1 View  02/23/2014   CLINICAL DATA:  Shortness of breath. Non-Hodgkin's lymphoma. Diabetes and hypertension.  EXAM: PORTABLE CHEST - 1 VIEW  COMPARISON:  DG CHEST 2 VIEW dated 02/16/2014; DG CHEST 1 VIEW dated 02/03/2014; CT CHEST W/CM dated 12/02/2013  FINDINGS: The heart is mildly enlarged. Right middle lobe nodule appears larger, now measuring 2.7 cm. Probable right upper lobe nodule is only  partially seen, measuring approximately 1 cm in diameter radiographically. Lesion was larger based on prior CT evaluation. There is slight prominence the left hilar region but this is felt to be stable compared with prior radiographs and CT. No new consolidation or pleural effusion.  IMPRESSION: Persistent right lung nodules. Right middle lobe nodule is larger ring compared prior studies.   Electronically Signed   By: Shon Hale M.D.   On: 02/24/2014 13:08      ASSESSMENT / PLAN:  PULMONARY A: Dyspnea -  No pulmonary edema/effusions on CXR to suggest volume overload. Concern for PE due to malignancy, elevated -dimer, and left unilateral pain/swelling Right Pulmonary Nodules  P:   F/U b/l LE DUS (performed at [facility]) F/U V/Q scan F/U 2-D Echo Cont heparin gtt  CARDIOVASCULAR A: Tachycardia - possibly due to PE Hypotension - possibly due to PE  P:  Monitor on telemetry   RENAL A:  Anuric/Oliguric AKI - Etiology ? tumor lysis syndrome (TLS) considering hyperkalemia, hyperphosphatemia, and hyperuricemia but chemo timing does not fit in with TLS Hyperkalemia  Hyponatremia Hypercalcemia  Hyperphosphatemia Metabolic acidosis P:   Emergent HD (CRRT), place HD catheter Consider bicarbonate drip  Consider Rasburicase, IVF's, diuretic ; awiat oncology consult Consider CT w/o cont abdomen to assess tumor Continue Solu-cortef  Monitor BMP Renal following  GASTROINTESTINAL A: History of GI bleed - currently stable  P:  Continue to monitor   HEMATOLOGIC A:  Follicular low-grade non-Hodgkin's lymphoma with large left groin lymphadenopathy  s/p chemotherapy Possible tumor lysis syndrome Hyperuricemia  Normocytic Anemia  P:  Oncology following F/U G6PD Consider Rasburicase, IVF's, diuretic   INFECTIOUS A:  Left groin wound with drainage Leukocytosis  P:   Wound care consult   ENDOCRINE A: Type II Diabetes Mellitus  P:   ISS if CBG > 180  NEUROLOGIC A: No acute issues P:    Monitor closely   TODAY'S SUMMARY:   I have personally obtained a history, examined the patient, evaluated laboratory and imaging results, formulated the assessment and plan and placed orders. CRITICAL CARE: The patient is critically ill with multiple organ systems failure and requires high complexity decision making for assessment and support, frequent evaluation and titration of therapies, application of advanced monitoring technologies and extensive interpretation of multiple databases. Critical Care Time devoted to patient care services described in this note is 60 minutes.     [facility] [place] Pager: [phone]  03/17/2014, 5:55 PM  RABBANI, MARJAN PGY1-IMTS  STAFF NOTE: I, Dr Ann Lions have personally reviewed patient's available data, including medical history, events of note, physical examination and test results as part of my evaluation. I have discussed with resident/NP and other care providers such as pharmacist, RN and RRT.  In addition,  I personally evaluated patient and elicited key findings of acute renal failure, hyperkalemia, Needs emergent HD. So admit to ICU. There is some debate if this is tumor lysis syndrome; defer to onc and renal.  Rest per NP/medical resident whose note is outlined above and that I agree with  The patient is critically ill with multiple organ systems failure and requires high complexity decision making for assessment and support, frequent evaluation and titration of therapies, application of advanced monitoring technologies and extensive interpretation of multiple databases.   Critical Care Time devoted to patient care services described in this note is  35  Minutes.  Dr. Brand Males, M.D., [facility].C.P Pulmonary and Critical Care Medicine Staff Physician [place] [facility] Pager: [phone], If no answer or between  15:00h - 7:00h: call [phone]   [phone]  03/04/2014 7:53 PM

## 2014-02-19 NOTE — ED Notes (Signed)
[facility] here to provide safe transport to [facility], NAD upon leaving dept.

## 2014-02-19 NOTE — Progress Notes (Signed)
Hemodialysis Catheter Insertion Procedure Note Douglas [name] [id number] July 16, 1956  Procedure: Insertion of Hemodialysis Catheter Indications: Dialysis Access   Procedure Details Consent: Risks of procedure as well as the alternatives and risks of each were explained to the (patient/caregiver).  Consent for procedure obtained. Time Out: Verified patient identification, verified procedure, site/side was marked, verified correct patient position, special equipment/implants available, medications/allergies/relevent history reviewed, required imaging and test results available.   Maximum sterile technique was used including antiseptics, cap, gloves, gown, hand hygiene, mask and sheet. Skin prep: Chlorhexidine; local anesthetic administered Triple lumen hemodialysis catheter was inserted into right internal jugular vein using the Seldinger technique. The vein was surprisingly deep (likely compression from hypovolemia as well). Certainly need a CXR but no dyspnea and good breath sounds on the right.  Evaluation Complications: No apparent complications Patient did tolerate procedure well.    Douglas Melena, MD 03/02/2014 Hemodialysis Catheter Insertion Procedure Note Douglas [name] [id number] December 31, 1955 03/06/2014

## 2014-02-19 NOTE — Consult Note (Signed)
This is a consult note for [employer]. We're asked to see him for the possibility of tumor lysis syndrome. He presents with renal failure with very elevated potassium. His uric acid is 14. His phosphorus is also elevated. His calcium is also elevated.  He has a history of a non-Hodgkin's lymphoma. This history has been known for 5 years. He recently had progressive disease. He subsequently had a biopsy done in December. The biopsy was suspicious for non-Hodgkin lymphoma. It was not a follicular lymphoma. It did not appear to be high grade.  He was started on therapy with bendamustine and Rituxan. He has had 3 cycles to date. In reviewing his labs over this time period, I have never seen an elevated potassium, or elevated uric acid, or renal insufficiency until he was admitted in March.  When he first started his therapy, his LDH was 1100 so we'll was suspect that if tumor lysis was going to occur this was when it would have happened.  Complicating all this is a large groin infection of the left inguinal region. He had this excised and in March this grew out both MRSA and Escherichia coli. He is not sure as to what antibiotic he's been on.  It appears that starting on March 17, and this was before his third cycle of chemotherapy, he was admitted with renal failure and hyperkalemia.  His third cycle of chemotherapy was given on March 18. At that time, his BUN was 99 the creatinine was 4.86. His potassium was 5.5. His uric acid was 12.9.  Back on February 17, his uric acid was 3.9.  He has a chronic anemia. With his third cycle of chemotherapy, his hemoglobin was 7.2 and hematocrit 22.7. In January, his LDH was approximately 1100. On admission today, and it was only 444.  He said that he had been on antibiotics for several weeks. Again he is not sure what he was on. He said he ran out a few days ago.  I noted that his sodium was very low. That in combination with his high potassium makes me  wonder about adrenal insufficiency. I suppose he also could have been incredibly dehydrated.  He still has this huge draining abscess in his left side. It looks like this might have to ultimately require surgery.  On is physical exam, he is morbidly obese. I do not find any palpable lymph glands. His lungs are relatively clear. Cardiac exam is slightly tachycardic but regular. It is hard to hear any kind of rub. Abdomen is obese. His left thigh is quite enlarged.  I just do not believe that he has tumor lysis syndrome. Again, going through all the labs it seems as if he acutely developed this renal failure about 3 weeks ago. He had already had 2 cycles of chemotherapy without any obvious complications of tumor lysis from lab studies done. I have to believe that this renal failure is a byproduct of another process. I don't know if it could be from the draining abscess and products from the MRSA and Escherichia coli. I don't know if it could be from antibiotics.  I don't see that we have to give him Rasburicase. I just don't believe that the mechanism of his renal failure  is uric acid nephropathy from tumor lysis.  I think it would be interesting to get a noncontrast CT of his pelvis to look at this lymphoma mass and to see what type of response has been obtained.  This is a very  complicated case. We will certainly follow along. If I can come up with any other suggestions that would suggest that his malignancy or treatment of his malignancy is the etiology of the renal injury, I will certainly let you know.  I will continue pray about this.  Frederich Cha 1: 5

## 2014-02-19 NOTE — ED Notes (Signed)
Call rec'd from nuc med that pt will need to go to [facility] for scan because of his weight, pt unable to be scanned here.  Dr. Dina Rich made aware.

## 2014-02-19 NOTE — ED Notes (Signed)
hospitalist at bs for eval

## 2014-02-19 NOTE — ED Notes (Signed)
U/S tech at bs for exam.  

## 2014-02-19 NOTE — Progress Notes (Signed)
ANTICOAGULATION CONSULT NOTE - Follow Up Consult  Pharmacy Consult for Heparin  Indication: pulmonary embolus, presumed  No Known Allergies  Patient Measurements: Height: 5' 10.08" (178 cm) Weight: 403 lb 7.1 oz (183 kg) IBW/kg (Calculated) : 73.18 Heparin Dosing Weight: ~120kg  Vital Signs: Temp: 98.1 F (36.7 C) (04/03 1900) Temp src: Oral (04/03 1900) BP: 99/62 mmHg (04/03 2300) Pulse Rate: 94 (04/03 2300)  Labs:  Recent Labs  02/23/2014 1220 02/27/2014 1500 03/02/2014 2200  HGB 9.8*  --   --   HCT 28.4*  --   --   PLT 256  --   --   LABPROT 15.0  --   --   INR 1.21  --   --   HEPARINUNFRC  --  >1.10* 1.30*  CREATININE 7.90* 7.71* 7.57*  CKTOTAL  --  40  --   CKMB  --  1.4  --   TROPONINI <0.30  --   --     Estimated Creatinine Clearance: 18 ml/min (by C-G formula based on Cr of 7.57).   Medications:  Heparin 2300 units/hr  Assessment: 58 y/o M tx from [facility] ED, on heparin for possible PE given elevated D-Dimer, started on CRRT for ARF, HL was >1.10 from HL drawn at [facility] (not sure why heparin wasn't adjusted at this point). Repeat HL here tonight is 1.30, no current bleeding per RN. Heparin drip is only source of heparin (no heparin syringe with CRRT).   Goal of Therapy:  Heparin level 0.3-0.7 units/ml Monitor platelets by anticoagulation protocol: Yes   Plan:  -Hold heparin x 1 hour -Restart heparin at 1800 units/hr at 0100 -HL at 0900 -Daily CBC/HL -Monitor for bleeding  Douglas [name], Douglas [name] 03/13/2014,11:51 PM

## 2014-02-19 NOTE — H&P (Signed)
[facility] History and Physical  Unknown Schleyer GGY:[id number] DOB: 10/21/1956 DOA: 03-20-2014  Referring physician: ED PCP: [name], MD  Specialists: oncology Dr. Marin Olp consultedby me and will see Renal Dr. Lorrene Reid consulted by ED  Chief Complaint: SOB  HPI: Douglas [name] is a 58 y.o. male known h/o diabetes mellitus, hypertension, sleep apnea, dyslipidemia Morbid obesity, There is no weight on file to calculate BMI.  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 Alonna Minium to follow up herafter -noted disease progression c Left groin lymphadenopathy [path inconclusive-supposed progression] 10/2013-s/p Bendamustine/Rituxan 12/08/13-supposed to have palliative XRT, known Colon polyps 12/2103 and ?UGIB at that time, recently admitted 02/02/14 for AKI/Hyperkalemiaand slow healing L groin wound came to [facility] ed 03-20-2014 with Increasing SOB  Family states that ever since /dc he has had difficulty ambulating more than 4-5 feet, where prior to that hospital stay he was more functional, had no SOB with ambulation and was less sednetary He tells me he has notcied overall swelling in his LLE moreso than prior and has minimal discomfort behind his L knee.  He has had no fever, chills rigors cough or cold  He has had occaisonal CP with this SOB but no radiation of pain down his arm nor diaphoresis.  He has no symptoms suggestive reflux.  He has not had consitutional weight loss, night sweats or lethargy.  He however has not been eating very well and only drinking but for the past 2-3 days has been passing less urine than usual He reports no prior h/o nephrolithiasis and denies overt abd pain or discomfort or blood in his urine. His woudn in his L groin hads been draining maily serous fluid with occasioal blood but  comparitively seems much better when compared to pictures taken during last admit by Gen surgery   Emergency room workup revealed sodium 122, potassium 7.6, BUN 158,  creatinine 7.9, GFR 7, glucose 187, propionate 937 WBC 14.6 hemoglobin 9.8 hematocrit 28.4 platelets 256 predominant neutrophilia 88% on differential D-dimer 13.9 urine analysis = cloudy, specific gravity 1 point 019 small leukocytes negative nitrites hyaline casts noted  Initially tachycardic in the emergency room 115 to 120 range, blood pressure below 100 ranges however mentating well able to give full history, no fever no chills EKG = sinus tachycardia PR interval 0 0.12, QRS axis left anterior fascicular block minus 30 we related changes with T wave elevations V3 through V6 without any ST segment depressions  Portable chest x-ray Heart enlarged, right middle nodule = 2.7, larger right upper lobe nodule partially seen   Review of Systems: c above  Past Medical History  Diagnosis Date  . Non Hodgkin's lymphoma January 2009  . Diabetes mellitus without complication   . Hypertension   . Sleep apnea   . Lymphadenopathy, inguinal     left   Past Surgical History  Procedure Laterality Date  . Biopsy for lymphoma Left 11/02/13    groin  . Colonoscopy N/A 12/22/2013    Procedure: COLONOSCOPY;  Surgeon: Beryle Beams, MD;  Location: [facility] ENDOSCOPY;  Service: Endoscopy;  Laterality: N/A;  . Esophagogastroduodenoscopy N/A 12/22/2013    Procedure: ESOPHAGOGASTRODUODENOSCOPY (EGD);  Surgeon: Beryle Beams, MD;  Location: [facility];  Service: Endoscopy;  Laterality: N/A;  . Ercp N/A 12/24/2013    Procedure: UPPER ENDOSCOPY;  Surgeon: Beryle Beams, MD;  Location: [facility] ORS;  Service: Gastroenterology;  Laterality: N/A;  . Colonoscopy N/A 12/24/2013    Procedure: COLONOSCOPY;  Surgeon: Beryle Beams, MD;  Location: [facility] ORS;  Service: Gastroenterology;  Laterality: N/A;   Social History:  History   Social History Narrative  . No narrative on file    No Known Allergies  Family History  Problem Relation Age of Onset  . CAD Mother 68    Died at age 26  . CAD Father    Prior to Admission  medications   Medication Sig Start Date End Date Taking? Authorizing Provider  Alogliptin-Pioglitazone (OSENI) 25-15 MG TABS Take 1 tablet by mouth daily.   Yes Historical Provider, MD  atorvastatin (LIPITOR) 20 MG tablet Take 20 mg by mouth daily.   Yes Historical Provider, MD  docusate sodium (COLACE) 100 MG capsule Take 100 mg by mouth 2 (two) times daily.   Yes Historical Provider, MD  Ferrous Sulfate (IRON) 325 (65 FE) MG TABS Take 325 mg by mouth 2 (two) times daily. 12/26/13  Yes Adeline Saralyn Pilar, MD  Hydrocortisone-Aloe Vera (CORTIZONE-10 INTENSIVE HEALING) 1 % CREA Apply 1 application topically as needed (For itching).   Yes Historical Provider, MD  omeprazole (PRILOSEC) 40 MG capsule Take 1 capsule (40 mg total) by mouth 2 (two) times daily. 12/26/13  Yes Adeline Saralyn Pilar, MD  oxyCODONE-acetaminophen (PERCOCET) 10-325 MG per tablet Take 1 tablet by mouth every 6 (six) hours as needed for pain. 01/12/14  Yes Adrena E Johnson, PA-C  polyethylene glycol (MIRALAX / GLYCOLAX) packet Take 17 g by mouth daily as needed for mild constipation.    Yes Historical Provider, MD  prochlorperazine (COMPAZINE) 10 MG tablet Take 1 tablet (10 mg total) by mouth every 6 (six) hours as needed for nausea or vomiting. 12/01/13  Yes [name], MD   Physical Exam: Filed Vitals:   02/17/2014 1139 03/05/2014 1200  BP: 100/55 105/63  Pulse: 120 108  Temp: 98.4 F (36.9 C) 97.7 F (36.5 C)  TempSrc: Oral Oral  Resp: 18 22  SpO2: 98% 99%     General:  Morbidly obese, pleasant seems tired but otherwise not in cardiopulmonary distress   Eyes: EOMI, pallor noted  nonicteric  ENT: Moderate dentition   Neck: Soft supple no murmur or bruit noted no JVD   Cardiovascular: S1-S2 tachycardic no murmur and appreciated   Respiratory: Clinically clear   Abdomen: Nontender nondistended no rebound   Skin: Lower 20s bilaterally are swollen left slightly larger than the right however no cord appreciated . Groin  wound paced to have serous discharge with mild older however induration is present . On pressure there is no pain or exudation of any fluid   Musculoskeletal: Grossly intact moving all 4 limbs   Psychiatric: Euthymic   Neurologic: Grossly intact moving all 4 limbs equally 5/5 power bilaterally reflexes within normal limits, rest seems normal  Labs on Admission:  Basic Metabolic Panel:  Recent Labs Lab 03/10/2014 1220  NA 122*  K 7.6*  CL 82*  CO2 11*  GLUCOSE 187*  BUN 158*  CREATININE 7.90*  CALCIUM 11.5*   Liver Function Tests: No results found for this basename: AST, ALT, ALKPHOS, BILITOT, PROT, ALBUMIN,  in the last 168 hours No results found for this basename: LIPASE, AMYLASE,  in the last 168 hours No results found for this basename: AMMONIA,  in the last 168 hours CBC:  Recent Labs Lab 02/24/2014 1220  WBC 14.6*  NEUTROABS 12.8*  HGB 9.8*  HCT 28.4*  MCV 82.6  PLT 256   Cardiac Enzymes:  Recent Labs Lab 03/07/2014 1220  TROPONINI <0.30  BNP (last 3 results)  Recent Labs  03/05/2014 1220  PROBNP 937.1*   CBG: No results found for this basename: GLUCAP,  in the last 168 hours  Radiological Exams on Admission: Dg Chest Portable 1 View  03/07/2014   CLINICAL DATA:  Shortness of breath. Non-Hodgkin's lymphoma. Diabetes and hypertension.  EXAM: PORTABLE CHEST - 1 VIEW  COMPARISON:  DG CHEST 2 VIEW dated 02/16/2014; DG CHEST 1 VIEW dated 02/03/2014; CT CHEST W/CM dated 12/02/2013  FINDINGS: The heart is mildly enlarged. Right middle lobe nodule appears larger, now measuring 2.7 cm. Probable right upper lobe nodule is only partially seen, measuring approximately 1 cm in diameter radiographically. Lesion was larger based on prior CT evaluation. There is slight prominence the left hilar region but this is felt to be stable compared with prior radiographs and CT. No new consolidation or pleural effusion.  IMPRESSION: Persistent right lung nodules. Right middle lobe nodule  is larger ring compared prior studies.   Electronically Signed   By: Shon Hale M.D.   On: 02/20/2014 13:08    EKG: Independently reviewed. See above  Assessment/Plan Principal Problem:   Acute kidney injury-potentially related to tumor lysis-nephrology has been consulted, please inform Dr. Lorrene Reid at patient's admission when patient reaches the floor. Although he's had a renal ultrasound recently, I feel it would beneficial for him to get a full abdominal ultrasound and repeat renal ultrasound to ensure that there is no blockage. We cannot get a CT scan at this time because of his prohibitive kidney function.  I will obtain urine sodium, urine creatinine to determine fractional excretion of sodium. I think he would want dialysis if thought neessary.  Foley has been placed to collect urine    Shortness of breath-DDX = CHF vs. PE-it is very unlikely he has a septic cause, proBNP as well as d-dimer may be altered by his chemotherapy vs. acute kidney injury vs. primary disease process of non-Hodgkin's lymphoma. Nevertheless because he is having this degree of shortness of breath in addition to some chest discomfort, I feel it is prudent to get a V/Q scan as soon as possible. He is been placed appropriately on IV heparin GTT for presumptive treatment of the same. I will get also a stat echocardiogram [as well as bilateral lower extremity Dopplers] to rule out wall motion abnormality vs. right ventricular strain although the EKG is not suggestive of the same.  It would seem unusual that he would develop CHF so rapidly after being discharged just one half weeks ago   NHL (non-Hodgkin's lymphoma)-have consulted oncology who will weigh in on potential for tumor lysis syndrome. Appreciate input in advance.  Recommends stress dose steroids as this may be Addisonian in nature.  I started Cortef 50 mg every 6 hours   HTN (hypertension)-patient has borderline hypotension at present time.  I am not clear whether he  has actual hypotension because of his habitus and difficulty obtaining blood pressures with the standard cuff. At this stage we will defer central access as he is hemodynamically stable. If this changes we will need to involve critical care    Dyslipidemia-hold Lipitor daily for now-because of risk for myopathy, I will get a Ck and if this is above 2 or 3000 we may need to alkalinize his urine   Anemia of chronic disease-stable currently   Leukocytosis-unclear etiology. Could be related to primary disease state with NHL vs. his wound in his groin. He is afebrile at present time alcohol that external  Morbid obesity-needs outpatient followup   Lower GI bleed-currently stable   Diabetes mellitus-will discontinue his oral medication. Every 6 hourly coverage sliding scale insulin   Hyperkalemia-probably related to either acute kidney injury or tumor lysis syndrome. Emergency physician has administered Kayexalate. We'll repeat labs every 8 hours to denote end of creatinine as well as potassium   Hyponatremia-we will obtain urine sodium.  We have started IV saline repletion at 125 cc per hour which may need to be cut back to prevent complications such as central pontine myelinolysis however he does be volume as he has some mild hypotension.   Tumor lysis syndrome, possibility of-see above discussion  Patient will be transferred to [facility] given need for potential dialysis, in addition he cannot get the VQ scan here as he was noted on the table her. I have informed my colleague Dr. Thereasa Solo who will look in on the patient to insure stability however I am available for questions or concerns about admission orders  90 minutes spent in patient care, Full CODE STATUS confirmed Next of kin is Douglas [name] who should be informed of any changes   Nita Sells [facility] Pager [phone]  If 7PM-7AM, please contact night-coverage [email or web address] Password [email or web address] 03/04/2014, 2:50  PM

## 2014-02-19 NOTE — Consult Note (Addendum)
Reason for Consult: Acute renal failure, hyperkalemia, metabolic acidosis Referring Physician: Verneita Griffes MD [facility])  HPI: 58 year old African American man with past medical history significant for recurrent (v/s progression) non-Hodgkin's lymphoma (chemotherapy with rituximab and bendamustine) that he last apparently received >1 month ago. Recently discharged from the hospital with acute renal failure suspected to be prerenal exacerbated by ongoing hydrochlorothiazide and ARB. Medical management with intravenous fluids as well as Kayexalate for hyperkalemia. He also has type 2 diabetes mellitus, hypertension, morbid obesity and a nonhealing left inguinal wound following biopsy in December, 2014 and incision and drainage in March, 2015.  Presents today with a 3-4 day history of worsening shortness of breath is especially worse on ambulation. Had also noticed occasional chest pain that was exertional but without any diaphoresis or radiation down the arm. Has been noticing some poor appetite, increased hiccups and poor urine output over the past few days-no urine output until catheter placed today. Denies any dysuria cough urgency, frequency, hematuria or flank pain. Was treating shortness of breath as a upper respiratory tract infection with over-the-counter relief. Denies any nonsteroidal anti-inflammatory drug use, denies any intravenous contrast exposure.  In the emergency room, noted to be hyperkalemic 7.6 (repeat aftertreatment 7.1), hyponatremic and with elevated creatinine of 7.9. His uric acid level is also elevated-14.3 while LDH is 444. His renal function appears to be normal in early March-creatinine 0.9, rose to maximum of 5.4 on 02/02/2049 and at the time of discharge on 02/05/2014, was 1.95.    01/19/2014  02/02/2014  02/03/2014  02/04/2014  02/05/2014  02/21/2014   BUN 15.8 105 (H) 101 (H) 79 (H) 65 (H) 158 (H)  Creatinine 0.9 5.42 (H) 4.86 (H) 2.54 (H) 1.95 (H) 7.90 (H)     Past Medical  History  Diagnosis Date  . Non Hodgkin's lymphoma January 2009  . Diabetes mellitus without complication   . Hypertension   . Sleep apnea   . Lymphadenopathy, inguinal     left    Past Surgical History  Procedure Laterality Date  . Biopsy for lymphoma Left 11/02/13    groin  . Colonoscopy N/A 12/22/2013    Procedure: COLONOSCOPY;  Surgeon: Beryle Beams, MD;  Location: [facility]COPY;  Service: Endoscopy;  Laterality: N/A;  . Esophagogastroduodenoscopy N/A 12/22/2013    Procedure: ESOPHAGOGASTRODUODENOSCOPY (EGD);  Surgeon: Beryle Beams, MD;  Location: [facility];  Service: Endoscopy;  Laterality: N/A;  . Ercp N/A 12/24/2013    Procedure: UPPER ENDOSCOPY;  Surgeon: Beryle Beams, MD;  Location: [facility];  Service: Gastroenterology;  Laterality: N/A;  . Colonoscopy N/A 12/24/2013    Procedure: COLONOSCOPY;  Surgeon: Beryle Beams, MD;  Location: [facility];  Service: Gastroenterology;  Laterality: N/A;    Family History  Problem Relation Age of Onset  . CAD Mother 77    Died at age 34  . CAD Father     Social History:  reports that he has never smoked. He has never used smokeless tobacco. He reports that he does not drink alcohol or use illicit drugs.  Allergies: No Known Allergies  Medications:  Scheduled: . hydrocortisone sod succinate (SOLU-CORTEF) inj  50 mg Intravenous Q6H  . sodium polystyrene  30 g Oral Q6H    Results for orders placed during the hospital encounter of 02/22/2014 (from the past 48 hour(s))  CBC WITH DIFFERENTIAL     Status: Abnormal   Collection Time    02/24/2014 12:20 PM      Result Value Ref  Range   WBC 14.6 (*) 4.0 - 10.5 K/uL   RBC 3.44 (*) 4.22 - 5.81 MIL/uL   Hemoglobin 9.8 (*) 13.0 - 17.0 g/dL   HCT 28.4 (*) 39.0 - 52.0 %   MCV 82.6  78.0 - 100.0 fL   MCH 28.5  26.0 - 34.0 pg   MCHC 34.5  30.0 - 36.0 g/dL   RDW 16.4 (*) 11.5 - 15.5 %   Platelets 256  150 - 400 K/uL   Neutrophils Relative % 88 (*) 43 - 77 %   Neutro Abs 12.8 (*) 1.7 - 7.7 K/uL    Lymphocytes Relative 7 (*) 12 - 46 %   Lymphs Abs 1.1  0.7 - 4.0 K/uL   Monocytes Relative 5  3 - 12 %   Monocytes Absolute 0.7  0.1 - 1.0 K/uL   Eosinophils Relative 0  0 - 5 %   Eosinophils Absolute 0.0  0.0 - 0.7 K/uL   Basophils Relative 0  0 - 1 %   Basophils Absolute 0.0  0.0 - 0.1 K/uL  BASIC METABOLIC PANEL     Status: Abnormal   Collection Time    02/25/2014 12:20 PM      Result Value Ref Range   Sodium 122 (*) 137 - 147 mEq/L   Comment: REPEATED TO VERIFY   Potassium 7.6 (*) 3.7 - 5.3 mEq/L   Comment: NO VISIBLE HEMOLYSIS     REPEATED TO VERIFY     CRITICAL RESULT CALLED TO, READ BACK BY AND VERIFIED WITH:     Isaias Cowman RN AT 3888 ON 02/26/2014 BY BOVELL, TERSA.   Chloride 82 (*) 96 - 112 mEq/L   Comment: REPEATED TO VERIFY   CO2 11 (*) 19 - 32 mEq/L   Comment: REPEATED TO VERIFY   Glucose, Bld 187 (*) 70 - 99 mg/dL   BUN 158 (*) 6 - 23 mg/dL   Creatinine, Ser 7.90 (*) 0.50 - 1.35 mg/dL   Calcium 11.5 (*) 8.4 - 10.5 mg/dL   GFR calc non Af Amer 7 (*) >90 mL/min   GFR calc Af Amer 8 (*) >90 mL/min   Comment: (NOTE)     The eGFR has been calculated using the CKD EPI equation.     This calculation has not been validated in all clinical situations.     eGFR's persistently <90 mL/min signify possible Chronic Kidney     Disease.  TROPONIN I     Status: None   Collection Time    02/26/2014 12:20 PM      Result Value Ref Range   Troponin I <0.30  <0.30 ng/mL   Comment:            Due to the release kinetics of cTnI,     a negative result within the first hours     of the onset of symptoms does not rule out     myocardial infarction with certainty.     If myocardial infarction is still suspected,     repeat the test at appropriate intervals.  D-DIMER, QUANTITATIVE     Status: Abnormal   Collection Time    03/12/2014 12:20 PM      Result Value Ref Range   D-Dimer, Quant 13.90 (*) 0.00 - 0.48 ug/mL-FEU   Comment:            AT THE INHOUSE ESTABLISHED CUTOFF     VALUE  OF 0.48 ug/mL FEU,     THIS ASSAY HAS BEEN  DOCUMENTED     IN THE LITERATURE TO HAVE     A SENSITIVITY AND NEGATIVE     PREDICTIVE VALUE OF AT LEAST     98 TO 99%.  THE TEST RESULT     SHOULD BE CORRELATED WITH     AN ASSESSMENT OF THE CLINICAL     PROBABILITY OF DVT / VTE.  PRO B NATRIURETIC PEPTIDE     Status: Abnormal   Collection Time    03/17/2014 12:20 PM      Result Value Ref Range   Pro B Natriuretic peptide (BNP) 937.1 (*) 0 - 125 pg/mL  PROTIME-INR     Status: None   Collection Time    02/27/2014 12:20 PM      Result Value Ref Range   Prothrombin Time 15.0  11.6 - 15.2 seconds   INR 1.21  0.00 - 1.49  URINALYSIS, ROUTINE W REFLEX MICROSCOPIC     Status: Abnormal   Collection Time    02/25/2014  1:58 PM      Result Value Ref Range   Color, Urine YELLOW  YELLOW   APPearance CLOUDY (*) CLEAR   Specific Gravity, Urine 1.019  1.005 - 1.030   pH 5.0  5.0 - 8.0   Glucose, UA NEGATIVE  NEGATIVE mg/dL   Hgb urine dipstick NEGATIVE  NEGATIVE   Bilirubin Urine NEGATIVE  NEGATIVE   Ketones, ur NEGATIVE  NEGATIVE mg/dL   Protein, ur 100 (*) NEGATIVE mg/dL   Urobilinogen, UA 0.2  0.0 - 1.0 mg/dL   Nitrite NEGATIVE  NEGATIVE   Leukocytes, UA SMALL (*) NEGATIVE  URINE MICROSCOPIC-ADD ON     Status: Abnormal   Collection Time    03/13/2014  1:58 PM      Result Value Ref Range   Squamous Epithelial / LPF FEW (*) RARE   WBC, UA 3-6  <3 WBC/hpf   Bacteria, UA RARE  RARE   Casts HYALINE CASTS (*) NEGATIVE   Urine-Other AMORPHOUS URATES/PHOSPHATES    LACTATE DEHYDROGENASE     Status: Abnormal   Collection Time    02/22/2014  3:00 PM      Result Value Ref Range   LDH 444 (*) 94 - 250 U/L  URIC ACID     Status: Abnormal   Collection Time    02/22/2014  3:00 PM      Result Value Ref Range   Uric Acid, Serum 14.3 (*) 4.0 - 7.8 mg/dL  PHOSPHORUS     Status: Abnormal   Collection Time    02/28/2014  3:00 PM      Result Value Ref Range   Phosphorus 8.4 (*) 2.3 - 4.6 mg/dL  BASIC METABOLIC  PANEL     Status: Abnormal   Collection Time    03/03/2014  3:00 PM      Result Value Ref Range   Sodium 127 (*) 137 - 147 mEq/L   Comment: REPEATED TO VERIFY   Potassium 7.1 (*) 3.7 - 5.3 mEq/L   Comment: NO VISIBLE HEMOLYSIS     REPEATED TO VERIFY     RESULTS VERIFIED VIA RECOLLECT     CRITICAL RESULT CALLED TO, READ BACK BY AND VERIFIED WITH:     CARNEAL,C RN AT 1604 04.03.2015 BY LINEBERRY,R   Chloride 87 (*) 96 - 112 mEq/L   Comment: REPEATED TO VERIFY   CO2 12 (*) 19 - 32 mEq/L   Comment: REPEATED TO VERIFY   Glucose, Bld 104 (*) 70 - 99  mg/dL   BUN 152 (*) 6 - 23 mg/dL   Creatinine, Ser 7.71 (*) 0.50 - 1.35 mg/dL   Calcium 11.0 (*) 8.4 - 10.5 mg/dL   GFR calc non Af Amer 7 (*) >90 mL/min   GFR calc Af Amer 8 (*) >90 mL/min   Comment: (NOTE)     The eGFR has been calculated using the CKD EPI equation.     This calculation has not been validated in all clinical situations.     eGFR's persistently <90 mL/min signify possible Chronic Kidney     Disease.  HEPARIN LEVEL (UNFRACTIONATED)     Status: Abnormal   Collection Time    03/11/2014  3:00 PM      Result Value Ref Range   Heparin Unfractionated >1.10 (*) 0.30 - 0.70 IU/mL   Comment:            IF HEPARIN RESULTS ARE BELOW     EXPECTED VALUES, AND PATIENT     DOSAGE HAS BEEN CONFIRMED,     SUGGEST FOLLOW UP TESTING     OF ANTITHROMBIN III LEVELS.    US Renal  02/25/2014   CLINICAL DATA:  Evaluate for hydronephrosis  EXAM: RENAL/URINARY TRACT ULTRASOUND COMPLETE  COMPARISON:  US RENAL dated 02/03/2014; [place] ART/VEN FLOW ABD PELV DOPPLER dated 11/27/2013; US SCROTUM dated 11/27/2013  FINDINGS: Right Kidney:  Length: 11.5 cm. Echogenicity within normal limits. No mass or hydronephrosis visualized.  Left Kidney:  Length: 12.2 cm. Echogenicity within normal limits. No mass or hydronephrosis visualized.  Bladder:  Urinary bladder is decompressed, patient is status post Foley catheter insertion.  IMPRESSION: Negative renal ultrasound.    Electronically Signed   By: Margaree Mackintosh M.D.   On: 03/05/2014 16:09   Dg Chest Portable 1 View  03/03/2014   CLINICAL DATA:  Shortness of breath. Non-Hodgkin's lymphoma. Diabetes and hypertension.  EXAM: PORTABLE CHEST - 1 VIEW  COMPARISON:  DG CHEST 2 VIEW dated 02/16/2014; DG CHEST 1 VIEW dated 02/03/2014; CT CHEST W/CM dated 12/02/2013  FINDINGS: The heart is mildly enlarged. Right middle lobe nodule appears larger, now measuring 2.7 cm. Probable right upper lobe nodule is only partially seen, measuring approximately 1 cm in diameter radiographically. Lesion was larger based on prior CT evaluation. There is slight prominence the left hilar region but this is felt to be stable compared with prior radiographs and CT. No new consolidation or pleural effusion.  IMPRESSION: Persistent right lung nodules. Right middle lobe nodule is larger ring compared prior studies.   Electronically Signed   By: Shon Hale M.D.   On: 03/18/2014 13:08    Review of Systems  Constitutional: Positive for chills and malaise/fatigue. Negative for fever, weight loss and diaphoresis.  HENT: Positive for congestion. Negative for ear pain, hearing loss, sore throat and tinnitus.   Eyes: Negative.   Respiratory: Positive for cough, sputum production and shortness of breath. Negative for hemoptysis, wheezing and stridor.        Clear sputum, no hemoptysis  Cardiovascular: Positive for chest pain and leg swelling. Negative for palpitations, orthopnea and claudication.  Gastrointestinal: Positive for heartburn and nausea. Negative for vomiting, diarrhea, constipation and blood in stool.       Hiccups  Genitourinary: Negative.   Musculoskeletal: Negative for back pain, joint pain, myalgias and neck pain.       Left groin pain over non-healing wound  Skin: Negative.   Neurological: Positive for weakness. Negative for dizziness, tingling, tremors, sensory change and headaches.  Psychiatric/Behavioral: Negative for depression,  suicidal ideas and substance abuse. The patient is nervous/anxious.   All other systems reviewed and are negative.   Blood pressure 90/51, pulse 105, temperature 97.7 F (36.5 C), temperature source Oral, resp. rate 22, SpO2 100.00%. Physical Exam  Nursing note and vitals reviewed. Constitutional: He is oriented to person, place, and time. He appears well-nourished. No distress.  Morbidly obese  HENT:  Head: Normocephalic and atraumatic.  Nose: Nose normal.  Mouth/Throat: No oropharyngeal exudate.  Eyes: Conjunctivae and EOM are normal. Pupils are equal, round, and reactive to light. No scleral icterus.  Neck: Normal range of motion. Neck supple. No JVD present. No tracheal deviation present. No thyromegaly present.  Cardiovascular: Regular rhythm.   Murmur heard. Regular tachycardia ESM over apex  Respiratory: Effort normal. No respiratory distress. He has no wheezes.  Coarse, distant breath sounds bilaterally  GI: Soft. Bowel sounds are normal. He exhibits no distension. There is no tenderness. There is no rebound and no guarding.  Musculoskeletal: He exhibits edema. He exhibits no tenderness.  Asymmetric pedal edema-lymphedematous-appearing left lower extremity with chronic venous dermatitis. One plus right lower extremity edema  Neurological: He is alert and oriented to person, place, and time. No cranial nerve deficit. Coordination normal.  Skin: Skin is warm and dry. No rash noted. No erythema.  Nonhealing left groin wound noted    Assessment/Plan: 1. Acute renal failure: Appears consistent with ATN (unclear cause) versus tumor lysis syndrome (amorphous urate crystals suggestive of the latter). He appears to be anuric at this point and with significant volume overload and hyperkalemia. Renal ultrasound negative for obstruction. Emergent dialysis to be undertaken-given the fact that he is borderline blood pressures, CRRT would be the preferred modality for renal replacement.  Appreciate assistance from critical care in placing a dialysis catheter. Recommend Rasburicase for TLS. 2. Hyperkalemia: Appears to be from acute renal as well as tumor lysis syndrome-status post attempted medical management with limited benefit. Emergent hemodialysis. 3. Metabolic acidosis: Mixed anion gap and non-anion gap Metabolic acidosis from acute renal failure and organic anion release from TLS. Anticipate to improve with dialysis. 4. Hyponatremia: Secondary to acute renal failure and impaired water handling. To be corrected with dialysis. 5. Non-Hodgkin's lymphoma: With TLS, recommend Rasburicase and consult with hematology for further assistance with management.[name]  Yevonne Yokum K. 03/18/2014, 5:09 PM

## 2014-02-19 NOTE — ED Notes (Signed)
Pt aware of need for urine specimen, sts unable to provide at this time.   

## 2014-02-19 NOTE — Progress Notes (Signed)
Lab unable to obtain blood cultures peripherally after 2 phlebotomists attempted. Will hold on blood cultures now per Dr. Wanda Plump resident). Also, since pt has just been started on continuous dialysis, will delay CT scan(oncology order) until tomorrow. Also discussed with Dr. Nico[facility].

## 2014-02-19 NOTE — ED Notes (Addendum)
Pt A+Ox4, reports SOB x2 weeks, worsening over the last week.  Seen by PCP "but I can't take it anymore".  Pt speaking full/clear sentences, lsctab, dim, difficult to assess 2/2 body habitus.  Pt reports +orthopnea, +DOE.  Pt denies cough, fevers/chills.  Skin PWD with packed wound noted to L groin area, hard, swollen.  Pt reports VNA comes in to change packing/dressing.  Pt reports site improving.  Pt denies CP/palpitations.  ST on monitor.  Pt reports inc swelling to BLE, hx lymphadema LLE but swelling is worse.  +bsx4 quads.  abd s/nt/nd, morbidly obese.  NAD.  Changed to hospital gown, placed to cardiac/02 monitor.

## 2014-02-20 ENCOUNTER — Inpatient Hospital Stay (HOSPITAL_COMMUNITY): Payer: BC Managed Care – PPO

## 2014-02-20 DIAGNOSIS — N179 Acute kidney failure, unspecified: Secondary | ICD-10-CM | POA: Diagnosis not present

## 2014-02-20 DIAGNOSIS — L02219 Cutaneous abscess of trunk, unspecified: Secondary | ICD-10-CM | POA: Diagnosis not present

## 2014-02-20 DIAGNOSIS — I517 Cardiomegaly: Secondary | ICD-10-CM

## 2014-02-20 DIAGNOSIS — N19 Unspecified kidney failure: Secondary | ICD-10-CM

## 2014-02-20 DIAGNOSIS — C8299 Follicular lymphoma, unspecified, extranodal and solid organ sites: Secondary | ICD-10-CM | POA: Diagnosis not present

## 2014-02-20 DIAGNOSIS — E119 Type 2 diabetes mellitus without complications: Secondary | ICD-10-CM

## 2014-02-20 DIAGNOSIS — E876 Hypokalemia: Secondary | ICD-10-CM

## 2014-02-20 DIAGNOSIS — L03319 Cellulitis of trunk, unspecified: Secondary | ICD-10-CM

## 2014-02-20 LAB — BASIC METABOLIC PANEL
BUN: 135 mg/dL — ABNORMAL HIGH (ref 6–23)
CHLORIDE: 88 meq/L — AB (ref 96–112)
CO2: 17 meq/L — AB (ref 19–32)
Calcium: 9.8 mg/dL (ref 8.4–10.5)
Creatinine, Ser: 6.65 mg/dL — ABNORMAL HIGH (ref 0.50–1.35)
GFR calc Af Amer: 10 mL/min — ABNORMAL LOW (ref >90)
GFR calc non Af Amer: 8 mL/min — ABNORMAL LOW (ref >90)
Glucose, Bld: 158 mg/dL — ABNORMAL HIGH (ref 70–99)
POTASSIUM: 5.9 meq/L — AB (ref 3.7–5.3)
SODIUM: 131 meq/L — AB (ref 137–147)

## 2014-02-20 LAB — RENAL FUNCTION PANEL
Albumin: 2.6 g/dL — ABNORMAL LOW (ref 3.5–5.2)
Albumin: 2.5 g/dL — ABNORMAL LOW (ref 3.5–5.2)
BUN: 118 mg/dL — ABNORMAL HIGH (ref 6–23)
BUN: 91 mg/dL — ABNORMAL HIGH (ref 6–23)
CALCIUM: 9.5 mg/dL (ref 8.4–10.5)
CO2: 18 mEq/L — ABNORMAL LOW (ref 19–32)
CO2: 21 meq/L (ref 19–32)
CREATININE: 6.14 mg/dL — AB (ref 0.50–1.35)
Calcium: 9.1 mg/dL (ref 8.4–10.5)
Chloride: 84 mEq/L — ABNORMAL LOW (ref 96–112)
Chloride: 87 mEq/L — ABNORMAL LOW (ref 96–112)
Creatinine, Ser: 5.04 mg/dL — ABNORMAL HIGH (ref 0.50–1.35)
GFR calc Af Amer: 11 mL/min — ABNORMAL LOW (ref >90)
GFR calc Af Amer: 13 mL/min — ABNORMAL LOW (ref >90)
GFR calc non Af Amer: 9 mL/min — ABNORMAL LOW (ref >90)
GFR calc non Af Amer: 12 mL/min — ABNORMAL LOW (ref >90)
GLUCOSE: 163 mg/dL — AB (ref 70–99)
GLUCOSE: 162 mg/dL — AB (ref 70–99)
Phosphorus: 7.5 mg/dL — ABNORMAL HIGH (ref 2.3–4.6)
Phosphorus: 5.8 mg/dL — ABNORMAL HIGH (ref 2.3–4.6)
Potassium: 4.9 mEq/L (ref 3.7–5.3)
Potassium: 4.6 mEq/L (ref 3.7–5.3)
SODIUM: 133 meq/L — AB (ref 137–147)
Sodium: 132 mEq/L — ABNORMAL LOW (ref 137–147)

## 2014-02-20 LAB — MAGNESIUM: Magnesium: 2.6 mg/dL — ABNORMAL HIGH (ref 1.5–2.5)

## 2014-02-20 LAB — PROCALCITONIN: PROCALCITONIN: 2.58 ng/mL

## 2014-02-20 LAB — CBC
HCT: 26.4 % — ABNORMAL LOW (ref 39.0–52.0)
Hemoglobin: 9.1 g/dL — ABNORMAL LOW (ref 13.0–17.0)
MCH: 28.2 pg (ref 26.0–34.0)
MCHC: 34.5 g/dL (ref 30.0–36.0)
MCV: 81.7 fL (ref 78.0–100.0)
Platelets: 231 10*3/uL (ref 150–400)
RBC: 3.23 MIL/uL — ABNORMAL LOW (ref 4.22–5.81)
RDW: 16.6 % — AB (ref 11.5–15.5)
WBC: 12.7 10*3/uL — AB (ref 4.0–10.5)

## 2014-02-20 LAB — LACTIC ACID, PLASMA: Lactic Acid, Venous: 4.8 mmol/L — ABNORMAL HIGH (ref 0.5–2.2)

## 2014-02-20 LAB — PROTIME-INR
INR: 1.19 (ref 0.00–1.49)
Prothrombin Time: 14.8 seconds (ref 11.6–15.2)

## 2014-02-20 LAB — HEPARIN LEVEL (UNFRACTIONATED): HEPARIN UNFRACTIONATED: 1.26 [IU]/mL — AB (ref 0.30–0.70)

## 2014-02-20 LAB — URIC ACID: Uric Acid, Serum: 10.2 mg/dL — ABNORMAL HIGH (ref 4.0–7.8)

## 2014-02-20 MED ORDER — PRISMASOL BGK 4/2.5 32-4-2.5 MEQ/L IV SOLN
INTRAVENOUS | Status: DC
  Administered 2014-02-20 – 2014-02-21 (×6): via INTRAVENOUS_CENTRAL
  Filled 2014-02-20 (×18): qty 5000

## 2014-02-20 MED ORDER — HEPARIN SODIUM (PORCINE) 5000 UNIT/ML IJ SOLN
5000.0000 [IU] | Freq: Three times a day (TID) | INTRAMUSCULAR | Status: DC
  Administered 2014-02-20 – 2014-03-26 (×90): 5000 [IU] via SUBCUTANEOUS
  Filled 2014-02-20 (×105): qty 1

## 2014-02-20 MED ORDER — PRISMASOL BGK 4/2.5 32-4-2.5 MEQ/L IV SOLN
INTRAVENOUS | Status: DC
  Administered 2014-02-20 – 2014-02-21 (×4): via INTRAVENOUS_CENTRAL
  Filled 2014-02-20 (×6): qty 5000

## 2014-02-20 MED ORDER — HEPARIN (PORCINE) IN NACL 100-0.45 UNIT/ML-% IJ SOLN
1800.0000 [IU]/h | INTRAMUSCULAR | Status: DC
  Filled 2014-02-20 (×2): qty 250

## 2014-02-20 MED ORDER — PRISMASOL BGK 4/2.5 32-4-2.5 MEQ/L IV SOLN
INTRAVENOUS | Status: DC
  Administered 2014-02-20 – 2014-02-21 (×2): via INTRAVENOUS_CENTRAL
  Filled 2014-02-20 (×4): qty 5000

## 2014-02-20 NOTE — Progress Notes (Signed)
Douglas [name]   DOB:09-06-1956   QI#:[id number]   XLK#:[id number]  Subjective: patient mildly lethargic, confirms no chemo given 3/18, tolerating CRRT well   Objective: middle aged [place] American man examined in bed Filed Vitals:   02/20/14 0731  BP:   Pulse:   Temp: 98.1 F (36.7 C)  Resp:     Body mass index is 64.85 kg/(m^2).  Intake/Output Summary (Last 24 hours) at 02/20/14 0933 Last data filed at 02/20/14 0800  Gross per 24 hour  Intake 2805.67 ml  Output   2786 ml  Net  19.67 ml     Oropharynx no thrush  Lungs clear -- auscultated anterolaterally  Heart regular rate and rhythm  Abdomen obese, NT, +BS  Neuro nonfocal  MSK: Left groin abscess bandaged over  CBG (last 3)   Recent Labs  03/05/2014 1707 03/17/2014 1906  GLUCAP 124* 133*     Labs:  Lab Results  Component Value Date   WBC 12.7* 02/20/2014   HGB 9.1* 02/20/2014   HCT 26.4* 02/20/2014   MCV 81.7 02/20/2014   PLT 231 02/20/2014   NEUTROABS 12.8* 03/09/2014    @LASTCHEMISTRY @  Urine Studies No results found for this basename: UACOL, UAPR, USPG, UPH, UTP, UGL, UKET, UBIL, UHGB, UNIT, UROB, ULEU, UEPI, UWBC, URBC, UBAC, CAST, CRYS, UCOM, BILUA,  in the last 72 hours  Basic Metabolic Panel:  Recent Labs Lab 02/23/2014 1220 02/25/2014 1500 03/10/2014 2200 02/20/14 0030 02/20/14 0500  NA 122* 127* 124* 131* 132*  K 7.6* 7.1* 7.2* 5.9* 4.9  CL 82* 87* 85* 88* 84*  CO2 11* 12* 14* 17* 18*  GLUCOSE 187* 104* 151* 158* 163*  BUN 158* 152* 151* 135* 118*  CREATININE 7.90* 7.71* 7.57* 6.65* 6.14*  CALCIUM 11.5* 11.0* 10.5 9.8 9.5  MG  --   --   --   --  2.6*  PHOS  --  8.4*  --   --  7.5*   GFR Estimated Creatinine Clearance: 24 ml/min (by C-G formula based on Cr of 6.14). Liver Function Tests:  Recent Labs Lab 02/20/14 0500  ALBUMIN 2.6*   No results found for this basename: LIPASE, AMYLASE,  in the last 168 hours No results found for this basename: AMMONIA,  in the last 168 hours Coagulation  profile  Recent Labs Lab 02/24/2014 1220 02/20/14 0615  INR 1.21 1.19    CBC:  Recent Labs Lab 02/26/2014 1220 02/20/14 0500  WBC 14.6* 12.7*  NEUTROABS 12.8*  --   HGB 9.8* 9.1*  HCT 28.4* 26.4*  MCV 82.6 81.7  PLT 256 231   Cardiac Enzymes:  Recent Labs Lab 02/18/2014 1220 02/23/2014 1500  CKTOTAL  --  40  CKMB  --  1.4  TROPONINI <0.30  --    BNP: No components found with this basename: POCBNP,  CBG:  Recent Labs Lab 03/13/2014 1707 03/01/2014 1906  GLUCAP 124* 133*   D-Dimer  Recent Labs  02/20/2014 1220  DDIMER 13.90*   Hgb A1c No results found for this basename: HGBA1C,  in the last 72 hours Lipid Profile No results found for this basename: CHOL, HDL, LDLCALC, TRIG, CHOLHDL, LDLDIRECT,  in the last 72 hours Thyroid function studies No results found for this basename: TSH, T4TOTAL, FREET3, T3FREE, THYROIDAB,  in the last 72 hours Anemia work up No results found for this basename: VITAMINB12, FOLATE, FERRITIN, TIBC, IRON, RETICCTPCT,  in the last 72 hours Microbiology Recent Results (from the past 240 hour(s))  MRSA PCR SCREENING  Status: Abnormal   Collection Time    03/13/2014  5:27 PM      Result Value Ref Range Status   MRSA by PCR POSITIVE (*) NEGATIVE Final   Comment:            The GeneXpert MRSA Assay (FDA     approved for NASAL specimens     only), is one component of a     comprehensive MRSA colonization     surveillance program. It is not     intended to diagnose MRSA     infection nor to guide or     monitor treatment for     MRSA infections.     RESULT CALLED TO, READ BACK BY AND VERIFIED WITH:     Linda Hedges RN 2154 03/15/2014 A BROWNING  WOUND CULTURE     Status: None   Collection Time    02/28/2014  5:27 PM      Result Value Ref Range Status   Specimen Description WOUND   Final   Special Requests IMMUNOCOMPROMISED LEFT INGUINAL AREA   Final   Gram Stain     Final   Value: NO WBC SEEN     NO SQUAMOUS EPITHELIAL CELLS SEEN     RARE  GRAM NEGATIVE RODS     Performed at [facility]   Culture     Final   Value: Culture reincubated for better growth     Performed at [facility]   Report Status PENDING   Incomplete      Studies:  US Renal  03/04/2014   CLINICAL DATA:  Evaluate for hydronephrosis  EXAM: RENAL/URINARY TRACT ULTRASOUND COMPLETE  COMPARISON:  US RENAL dated 02/03/2014; [place] ART/VEN FLOW ABD PELV DOPPLER dated 11/27/2013; US SCROTUM dated 11/27/2013  FINDINGS: Right Kidney:  Length: 11.5 cm. Echogenicity within normal limits. No mass or hydronephrosis visualized.  Left Kidney:  Length: 12.2 cm. Echogenicity within normal limits. No mass or hydronephrosis visualized.  Bladder:  Urinary bladder is decompressed, patient is status post Foley catheter insertion.  IMPRESSION: Negative renal ultrasound.   Electronically Signed   By: Margaree Mackintosh M.D.   On: 03/14/2014 16:09   Dg Chest Port 1 View  02/20/2014   CLINICAL DATA:  Shortness of breath  EXAM: PORTABLE CHEST - 1 VIEW  COMPARISON:  02/18/2014  FINDINGS: A temporary dialysis catheter is again noted on the right. The cardiac shadow is stable. The lungs are well aerated bilaterally without focal infiltrate or sizable effusion.  IMPRESSION: No acute abnormality noted.   Electronically Signed   By: Inez Catalina M.D.   On: 02/20/2014 08:08   Dg Chest Port 1 View  02/26/2014   CLINICAL DATA:  Status post central line placement  EXAM: PORTABLE CHEST - 1 VIEW  COMPARISON:  02/28/2014 1251 hrs  FINDINGS: The cardiac shadow is stable. A temporary dialysis catheter is now seen in the distal superior vena cava. No pneumothorax is noted. Mild atelectatic changes are noted in the right base. This may be related to the poor inspiratory effort.  IMPRESSION: Status post temporary dialysis catheter placement without pneumothorax.   Electronically Signed   By: Inez Catalina M.D.   On: 03/18/2014 19:31   Dg Chest Portable 1 View  03/01/2014   CLINICAL DATA:  Shortness of breath.  Non-Hodgkin's lymphoma. Diabetes and hypertension.  EXAM: PORTABLE CHEST - 1 VIEW  COMPARISON:  DG CHEST 2 VIEW dated 02/16/2014; DG CHEST 1 VIEW dated 02/03/2014; CT  CHEST W/CM dated 12/02/2013  FINDINGS: The heart is mildly enlarged. Right middle lobe nodule appears larger, now measuring 2.7 cm. Probable right upper lobe nodule is only partially seen, measuring approximately 1 cm in diameter radiographically. Lesion was larger based on prior CT evaluation. There is slight prominence the left hilar region but this is felt to be stable compared with prior radiographs and CT. No new consolidation or pleural effusion.  IMPRESSION: Persistent right lung nodules. Right middle lobe nodule is larger ring compared prior studies.   Electronically Signed   By: Shon Hale M.D.   On: 02/21/2014 13:08    Assessment: 58 y.o. 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 I will alert surgery to the patient's admission in case they can provide improved drainage or debridement. I see no urgent need for a restaging CT, which can be done when the patient's condition is more stable--have postponed that test.  Will follow with you   Chauncey Cruel, MD 02/20/2014  9:33 AM

## 2014-02-20 NOTE — Plan of Care (Signed)
Problem: Phase I Progression Outcomes Goal: OOB as tolerated unless otherwise ordered Outcome: Not Met (add Reason) General weakness, esp left leg, pt on crrt

## 2014-02-20 NOTE — Progress Notes (Signed)
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  No need for surgery right now, can manage with vac.  If concerned there is infection around this could ct.  Will follow up Monday.

## 2014-02-20 NOTE — Progress Notes (Signed)
Subjective: This is a 58 y/o with non hodgkins lymphoma who underwent a core biopsy last year by IR 11/02/13. He has been on chemotherapy for his lymphoma. The site from the biopsy never really healed and started draining fluid with some skin necrosis back in the end of February or early March. He saw Dr. Zella Richer in the office on 01/19/14 and her performed an I&D of the site. He has been on wet to dry dressing since. His last office visit was 01/28/14. He was showing slow improvement. He is followed by home health and was admitted with findings of an elevated K+ 6.1 and a creatinine of 5.42. We are ask to see to reevaluate his open groin wound and discuss further rx.[name]  He remains on chemotherapy and his last treatment was 01/05/14, on his last admission, I cannot tell if he has had another since.   He presented to the ED with worsening SOB, BP 85, K+ 7.6 and creatinine of 7.9.  WBC 14.6.  He is now in the ICU, on CRRT and will transition to HD.  We are ask to see. He thinks it's a little smaller and just looking at it so do I. It is so irregular it is difficult to measure and get a really accurate measure.  Objective: Vital signs in last 24 hours: Temp:  [97.6 F (36.4 C)-98.4 F (36.9 C)] 98.1 F (36.7 C) (04/04 0731) Pulse Rate:  [88-120] 93 (04/04 0700) Resp:  [13-32] 14 (04/04 0700) BP: (85-148)/(43-99) 132/73 mmHg (04/04 0700) SpO2:  [96 %-100 %] 100 % (04/04 0700) Weight:  [183 kg (403 lb 7.1 oz)-205.479 kg (453 lb)] 205.479 kg (453 lb) (04/04 0454) Last BM Date: 02/16/14 (per pt)  Intake/Output from previous day: 04/03 0701 - 04/04 0700 In: 2805.7 [I.V.:2305.7; IV Piggyback:500] Out: 2610 [Urine:118] Intake/Output this shift: Total I/O In: -  Out: 737 [Other:737]  General appearance: alert, cooperative and no distress Skin: Skin color, texture, turgor normal. No rashes or lesions or He has a huge lymphnode left groin about 20 x 12 cm.  In the mid portion where the cored biopsy  was performed there is a 8cm x 5 cm open area that is clean and pink. there is one area that goes about 6-7 deep, and it is 1-1.5 cm in diameter.   It produces a very large amout of fluid that is essentially clear.  The wound is packed dry becasue it soaks thru in a few minutes .  Distal thigh                                     Abdomen is being held by                                                          Hand in picture. Picture is taken from the left side.    This picture is from his [facility] admission taken on 02/03/14. The lymphnode looks smaller but the opening is about the same. Lab Results:   Recent Labs  02/18/2014 1220 02/20/14 0500  WBC 14.6* 12.7*  HGB 9.8* 9.1*  HCT 28.4* 26.4*  PLT 256 231    BMET  Recent Labs  02/20/14 0030 02/20/14 0500  NA 131* 132*  K 5.9* 4.9  CL 88* 84*  CO2 17* 18*  GLUCOSE 158* 163*  BUN 135* 118*  CREATININE 6.65* 6.14*  CALCIUM 9.8 9.5   PT/INR  Recent Labs  03/06/2014 1220 02/20/14 0615  LABPROT 15.0 14.8  INR 1.21 1.19     Recent Labs Lab 02/20/14 0500  ALBUMIN 2.6*     Lipase  No results found for this basename: lipase     Studies/Results: US Renal  03/11/2014   CLINICAL DATA:  Evaluate for hydronephrosis  EXAM: RENAL/URINARY TRACT ULTRASOUND COMPLETE  COMPARISON:  US RENAL dated 02/03/2014; [place] ART/VEN FLOW ABD PELV DOPPLER dated 11/27/2013; US SCROTUM dated 11/27/2013  FINDINGS: Right Kidney:  Length: 11.5 cm. Echogenicity within normal limits. No mass or hydronephrosis visualized.  Left Kidney:  Length: 12.2 cm. Echogenicity within normal limits. No mass or hydronephrosis visualized.  Bladder:  Urinary bladder is decompressed, patient is status post Foley catheter insertion.  IMPRESSION: Negative renal ultrasound.   Electronically Signed   By: Margaree Mackintosh M.D.   On: 02/17/2014 16:09   Dg Chest Port 1 View  02/20/2014   CLINICAL DATA:  Shortness of breath  EXAM: PORTABLE CHEST - 1 VIEW  COMPARISON:  02/18/2014  FINDINGS: A  temporary dialysis catheter is again noted on the right. The cardiac shadow is stable. The lungs are well aerated bilaterally without focal infiltrate or sizable effusion.  IMPRESSION: No acute abnormality noted.   Electronically Signed   By: Inez Catalina M.D.   On: 02/20/2014 08:08   Dg Chest Port 1 View  02/18/2014   CLINICAL DATA:  Status post central line placement  EXAM: PORTABLE CHEST - 1 VIEW  COMPARISON:  03/09/2014 1251 hrs  FINDINGS: The cardiac shadow is stable. A temporary dialysis catheter is now seen in the distal superior vena cava. No pneumothorax is noted. Mild atelectatic changes are noted in the right base. This may be related to the poor inspiratory effort.  IMPRESSION: Status post temporary dialysis catheter placement without pneumothorax.   Electronically Signed   By: Inez Catalina M.D.   On: 03/04/2014 19:31   Dg Chest Portable 1 View  02/21/2014   CLINICAL DATA:  Shortness of breath. Non-Hodgkin's lymphoma. Diabetes and hypertension.  EXAM: PORTABLE CHEST - 1 VIEW  COMPARISON:  DG CHEST 2 VIEW dated 02/16/2014; DG CHEST 1 VIEW dated 02/03/2014; CT CHEST W/CM dated 12/02/2013  FINDINGS: The heart is mildly enlarged. Right middle lobe nodule appears larger, now measuring 2.7 cm. Probable right upper lobe nodule is only partially seen, measuring approximately 1 cm in diameter radiographically. Lesion was larger based on prior CT evaluation. There is slight prominence the left hilar region but this is felt to be stable compared with prior radiographs and CT. No new consolidation or pleural effusion.  IMPRESSION: Persistent right lung nodules. Right middle lobe nodule is larger ring compared prior studies.   Electronically Signed   By: Shon Hale M.D.   On: 02/23/2014 13:08    Medications: . Chlorhexidine Gluconate Cloth  6 each Topical Q0600  . hydrocortisone sod succinate (SOLU-CORTEF) inj  50 mg Intravenous Q6H  . mupirocin ointment  1 application Nasal BID  . sodium chloride  3 mL  Intravenous Q12H  . vancomycin  1,500 mg Intravenous Q24H    Assessment/Plan 1.  Open biopsy site; left groin with Non Hodgkin's lymphoma.  Ongoing primarily clear drainage.  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 Culture yesterday shows just a few  Gm Negative rods 2. Hypotension 3.  Acute renal failure- creatinine up to 7.57 4.  Hyperkalemia - K+7.2 5.  AODM  6.  Hypertension  7.  Sleep Apnea  8.  BMI 61    Plan:  I think the lymph node is smaller the open area that is leaking is about the same.  I have discussed with Dr. [name] and we will ask wound care to place a wound vac over the open lymph node.   LOS: 1 day    Bradden Tadros 02/20/2014

## 2014-02-20 NOTE — Progress Notes (Signed)
CRITICAL VALUE ALERT  Critical value received:  K 7.2  Date of notification:  03/03/2014   Time of notification:  2350   Critical value read back:yes  Nurse who received alert:  Wynetta Fines   MD notified (1st page):  Dr. Nicoletta Dress   Time of first page:  NA(spoke with MD on unit)  MD notified (2nd page):  Time of second page:  Responding MD:  Dr. Nicoletta Dress  Time MD responded:  2350

## 2014-02-20 NOTE — Progress Notes (Signed)
VASCULAR LAB PRELIMINARY  PRELIMINARY  PRELIMINARY  PRELIMINARY  Bilateral lower extremity venous Dopplers completed.    Preliminary report:  There is no obvious evidence of DVT or SVT noted in the bilateral lower extremities.  Limited views of the left leg secondary to lymphedema and skin texture.  Leston Schueller, RVT 02/20/2014, 11:52 AM

## 2014-02-20 NOTE — Progress Notes (Signed)
Patient ID: Douglas [name], male   DOB: 11-29-1955, 58 y.o.   MRN: [id number]   [facility] Progress Note    Assessment/ Plan:   1. Acute renal failure: Possible ATN (SIRS from left groin wound source) vs TLS (felt unlikely by hematology).  He remains anuric and with a high catabolic rate- will continue CRRT to provide clearance for the next 24 hours and then consider stopping/transition to HD. Will need wound care/surgical input on management of draining left groin wound. 2. Hyperkalemia: Corrected with HD- switch dialysate and replacement fluids to 4K.  3. Metabolic acidosis: Improving on CRRT, DC HCO3 replacement.  4. Hyponatremia: Secondary to acute renal failure and impaired water handling. Improving with dialysis.  5. Non-Hodgkin's lymphoma: On stress dose steroids, further management per hematology.  Subjective:   No acute events overnight- on CRRT   Objective:   BP 132/73  Pulse 93  Temp(Src) 98.1 F (36.7 C) (Oral)  Resp 14  Ht 5' 10.08" (1.78 m)  Wt 205.479 kg (453 lb)  BMI 64.85 kg/m2  SpO2 100%  Intake/Output Summary (Last 24 hours) at 02/20/14 0804 Last data filed at 02/20/14 0700  Gross per 24 hour  Intake 2805.67 ml  Output   2610 ml  Net 195.67 ml   Weight change:   Physical Exam: QPY:[id number] sleeping- awakens on exam WPY:KDXIP RRR, normal S1 and S2 Resp:Coarse/distant breath sounds, no rales Abd: soft, obese, NT, BS normal JAS:[id number] LLE and 1+ edema RLE. Dressing over left groin wound   Imaging: US Renal  03/05/2014   CLINICAL DATA:  Evaluate for hydronephrosis  EXAM: RENAL/URINARY TRACT ULTRASOUND COMPLETE  COMPARISON:  US RENAL dated 02/03/2014; [place] ART/VEN FLOW ABD PELV DOPPLER dated 11/27/2013; US SCROTUM dated 11/27/2013  FINDINGS: Right Kidney:  Length: 11.5 cm. Echogenicity within normal limits. No mass or hydronephrosis visualized.  Left Kidney:  Length: 12.2 cm. Echogenicity within normal limits. No mass or hydronephrosis  visualized.  Bladder:  Urinary bladder is decompressed, patient is status post Foley catheter insertion.  IMPRESSION: Negative renal ultrasound.   Electronically Signed   By: Margaree Mackintosh M.D.   On: 03/16/2014 16:09   Dg Chest Port 1 View  03/16/2014   CLINICAL DATA:  Status post central line placement  EXAM: PORTABLE CHEST - 1 VIEW  COMPARISON:  03/01/2014 1251 hrs  FINDINGS: The cardiac shadow is stable. A temporary dialysis catheter is now seen in the distal superior vena cava. No pneumothorax is noted. Mild atelectatic changes are noted in the right base. This may be related to the poor inspiratory effort.  IMPRESSION: Status post temporary dialysis catheter placement without pneumothorax.   Electronically Signed   By: Inez Catalina M.D.   On: 02/25/2014 19:31   Dg Chest Portable 1 View  02/17/2014   CLINICAL DATA:  Shortness of breath. Non-Hodgkin's lymphoma. Diabetes and hypertension.  EXAM: PORTABLE CHEST - 1 VIEW  COMPARISON:  DG CHEST 2 VIEW dated 02/16/2014; DG CHEST 1 VIEW dated 02/03/2014; CT CHEST W/CM dated 12/02/2013  FINDINGS: The heart is mildly enlarged. Right middle lobe nodule appears larger, now measuring 2.7 cm. Probable right upper lobe nodule is only partially seen, measuring approximately 1 cm in diameter radiographically. Lesion was larger based on prior CT evaluation. There is slight prominence the left hilar region but this is felt to be stable compared with prior radiographs and CT. No new consolidation or pleural effusion.  IMPRESSION: Persistent right lung nodules. Right middle lobe nodule is larger ring compared prior  studies.   Electronically Signed   By: Shon Hale M.D.   On: 02/23/2014 13:08    Labs: BMET  Recent Labs Lab 02/20/2014 1220 03/15/2014 1500 02/23/2014 2200 02/20/14 0030 02/20/14 0500  NA 122* 127* 124* 131* 132*  K 7.6* 7.1* 7.2* 5.9* 4.9  CL 82* 87* 85* 88* 84*  CO2 11* 12* 14* 17* 18*  GLUCOSE 187* 104* 151* 158* 163*  BUN 158* 152* 151* 135* 118*   CREATININE 7.90* 7.71* 7.57* 6.65* 6.14*  CALCIUM 11.5* 11.0* 10.5 9.8 9.5  PHOS  --  8.4*  --   --  7.5*   CBC  Recent Labs Lab 02/22/2014 1220 02/20/14 0500  WBC 14.6* 12.7*  NEUTROABS 12.8*  --   HGB 9.8* 9.1*  HCT 28.4* 26.4*  MCV 82.6 81.7  PLT 256 231   Medications:    . Chlorhexidine Gluconate Cloth  6 each Topical Q0600  . hydrocortisone sod succinate (SOLU-CORTEF) inj  50 mg Intravenous Q6H  . mupirocin ointment  1 application Nasal BID  . sodium chloride  3 mL Intravenous Q12H  . vancomycin  1,500 mg Intravenous Q24H   Elmarie Shiley, MD 02/20/2014, 8:04 AM

## 2014-02-20 NOTE — Progress Notes (Addendum)
PULMONARY / CRITICAL CARE MEDICINE   Name: Douglas [name] MRN: [id number] DOB: 1956-10-20    ADMISSION DATE:  03/15/2014 CONSULTATION DATE:  03/06/2014  REFERRING MD : Verneita Griffes  PRIMARY SERVICE: Family Medicine   CHIEF COMPLAINT: worsening shortness of breath  BRIEF PATIENT DESCRIPTION:   58 yo M with pmh of follicular low-grade non-Hodgkin's lymphoma with large left groin lymphadenopathy diagnosed in 2009 with recent chemotherapy and admission for AKI 3/17-3/20 who presented as a transfer from [place] with 1.5 week history of shortness of breath found to be volume overloaded with hyperkalemia and acute renal failure requiring emergent HD.   SIGNIFICANT EVENTS / STUDIES:  4/3 - Hyperkalemia, hyperuricemia, hyperphosphatemia and volume overload with ARF requiring emergent HD. UA with urate crystals. Renal US normal. No pulmonary edema on CXR. Heparin gtt started due to concern for PE.    LINES / TUBES: None  CULTURES: MRSA PCR 4/3 Wound 4/3   ANTIBIOTICS: None    SUBJECTIVE/OVERNIGHT/INTERVAL HX  4./4/15: Continues on HD. onc does not think it is tumor lysis. Renal plans CVVH. He finds room cold. hungy and wants to eat.   Per [facility] PA: Has LOT SIGNIFICANT DRAINAGE FROM WOUND and ? Is CAUSE OF RENAL FAILURE  VITAL SIGNS: Temp:  [97.5 F (36.4 C)-98.1 F (36.7 C)] 97.5 F (36.4 C) (04/04 1203) Pulse Rate:  [88-119] 93 (04/04 0700) Resp:  [13-32] 14 (04/04 0700) BP: (85-148)/(43-99) 132/73 mmHg (04/04 0700) SpO2:  [96 %-100 %] 100 % (04/04 0700) Weight:  [183 kg (403 lb 7.1 oz)-205.479 kg (453 lb)] 205.479 kg (453 lb) (04/04 0454) HEMODYNAMICS:   VENTILATOR SETTINGS:   INTAKE / OUTPUT: Intake/Output     04/03 0701 - 04/04 0700 04/04 0701 - 04/05 0700   I.V. (mL/kg) 2305.7 (11.2)    IV Piggyback 500    Total Intake(mL/kg) 2805.7 (13.7)    Urine (mL/kg/hr) 118    Other 2492 737 (0.7)   Total Output 2610 737   Net +195.7 -737        Stool Occurrence 3 x       PHYSICAL EXAMINATION: General: NAD, morbidly obese Neuro: Moving extremities, normal strength   HEENT:  Normocephalic  Cardiovascular:  Tachycardic  Lungs: clear to ausculation anteriorly  Abdomen: lower abdominal tenderness, normal BS, no guarding Musculoskeletal:  Left +3 LE pitting edema up to thigh Skin:  Left groin wound - non healing, foul smelling ([facility] does not think needs current surgery, feels lymphatic fluid leaking)  LABS:   PULMONARY No results found for this basename: PHART, PCO2, PCO2ART, PO2, PO2ART, HCO3, TCO2, O2SAT,  in the last 168 hours  CBC  Recent Labs Lab 03/07/2014 1220 02/20/14 0500  HGB 9.8* 9.1*  HCT 28.4* 26.4*  WBC 14.6* 12.7*  PLT 256 231    COAGULATION  Recent Labs Lab 03/09/2014 1220 02/20/14 0615  INR 1.21 1.19    CARDIAC   Recent Labs Lab 02/25/2014 1220  TROPONINI <0.30    Recent Labs Lab 02/18/2014 1220  PROBNP 937.1*     CHEMISTRY  Recent Labs Lab 03/08/2014 1220 03/17/2014 1500 03/09/2014 2200 02/20/14 0030 02/20/14 0500  NA 122* 127* 124* 131* 132*  K 7.6* 7.1* 7.2* 5.9* 4.9  CL 82* 87* 85* 88* 84*  CO2 11* 12* 14* 17* 18*  GLUCOSE 187* 104* 151* 158* 163*  BUN 158* 152* 151* 135* 118*  CREATININE 7.90* 7.71* 7.57* 6.65* 6.14*  CALCIUM 11.5* 11.0* 10.5 9.8 9.5  MG  --   --   --   --  2.6*  PHOS  --  8.4*  --   --  7.5*   Estimated Creatinine Clearance: 24 ml/min (by C-G formula based on Cr of 6.14).   LIVER  Recent Labs Lab 03/01/2014 1220 02/20/14 0500 02/20/14 0615  ALBUMIN  --  2.6*  --   INR 1.21  --  1.19     INFECTIOUS  Recent Labs Lab 02/20/14 0500  LATICACIDVEN 4.8*  PROCALCITON 2.58     ENDOCRINE CBG (last 3)   Recent Labs  02/28/2014 1707 02/27/2014 1906  GLUCAP 124* 133*         IMAGING x48h  US Renal  03/05/2014   CLINICAL DATA:  Evaluate for hydronephrosis  EXAM: RENAL/URINARY TRACT ULTRASOUND COMPLETE  COMPARISON:  US RENAL dated 02/03/2014; [place] ART/VEN FLOW ABD PELV  DOPPLER dated 11/27/2013; US SCROTUM dated 11/27/2013  FINDINGS: Right Kidney:  Length: 11.5 cm. Echogenicity within normal limits. No mass or hydronephrosis visualized.  Left Kidney:  Length: 12.2 cm. Echogenicity within normal limits. No mass or hydronephrosis visualized.  Bladder:  Urinary bladder is decompressed, patient is status post Foley catheter insertion.  IMPRESSION: Negative renal ultrasound.   Electronically Signed   By: Margaree Mackintosh M.D.   On: 02/27/2014 16:09   Dg Chest Port 1 View  02/20/2014   CLINICAL DATA:  Shortness of breath  EXAM: PORTABLE CHEST - 1 VIEW  COMPARISON:  02/22/2014  FINDINGS: A temporary dialysis catheter is again noted on the right. The cardiac shadow is stable. The lungs are well aerated bilaterally without focal infiltrate or sizable effusion.  IMPRESSION: No acute abnormality noted.   Electronically Signed   By: Inez Catalina M.D.   On: 02/20/2014 08:08   Dg Chest Port 1 View  03/04/2014   CLINICAL DATA:  Status post central line placement  EXAM: PORTABLE CHEST - 1 VIEW  COMPARISON:  02/25/2014 1251 hrs  FINDINGS: The cardiac shadow is stable. A temporary dialysis catheter is now seen in the distal superior vena cava. No pneumothorax is noted. Mild atelectatic changes are noted in the right base. This may be related to the poor inspiratory effort.  IMPRESSION: Status post temporary dialysis catheter placement without pneumothorax.   Electronically Signed   By: Inez Catalina M.D.   On: 02/21/2014 19:31   Dg Chest Portable 1 View  03/01/2014   CLINICAL DATA:  Shortness of breath. Non-Hodgkin's lymphoma. Diabetes and hypertension.  EXAM: PORTABLE CHEST - 1 VIEW  COMPARISON:  DG CHEST 2 VIEW dated 02/16/2014; DG CHEST 1 VIEW dated 02/03/2014; CT CHEST W/CM dated 12/02/2013  FINDINGS: The heart is mildly enlarged. Right middle lobe nodule appears larger, now measuring 2.7 cm. Probable right upper lobe nodule is only partially seen, measuring approximately 1 cm in diameter  radiographically. Lesion was larger based on prior CT evaluation. There is slight prominence the left hilar region but this is felt to be stable compared with prior radiographs and CT. No new consolidation or pleural effusion.  IMPRESSION: Persistent right lung nodules. Right middle lobe nodule is larger ring compared prior studies.   Electronically Signed   By: Shon Hale M.D.   On: 03/11/2014 13:08      ASSESSMENT / PLAN:  PULMONARY A: Dyspnea -  No pulmonary edema/effusions on CXR to suggest volume overload.  Right Pulmonary Nodules  Duplex LE - negative for DVT. Normoxic. Low Concern for PE  P:   DC V/Q scan F/U 2-D Echo DC  heparin gtt  CARDIOVASCULAR A:  Normotensive  P:  Monitor on telemetry   RENAL A:  Anuric/Oliguric Acute renal failure - stage 4  - ? Due to volume loss from wound   - Onc does not feel is TLS   P:   Per renal and onc   GASTROINTESTINAL A: History of GI bleed - currently stable  P:  Continue to monitor   HEMATOLOGIC A:  Follicular low-grade non-Hodgkin's lymphoma with large left groin lymphadenopathy  s/p chemotherapy Possible tumor lysis syndrome Hyperuricemia  Normocytic Anemia  P:  Oncology following F/U G6PD in case rasbiuricase needed     INFECTIOUS A:  Left groin wound with drainage , non healing s/p biopsy  - per [facility] conservative care  P:   Wound care consult  Rest per [facility]  ENDOCRINE A: Type II Diabetes Mellitus  P:   ISS if CBG > 180  NEUROLOGIC A: No acute issues P:   Monitor closely   TODAY'S SUMMARY:   CCM time 35 minutes  02/20/14: keep one more day in ICU. No family around   Dr. Brand Males, M.D., [facility].C.P Pulmonary and Critical Care Medicine Staff Physician [facility] Pager: [phone], If no answer or between  15:00h - 7:00h: call [phone]  02/20/2014 12:16 PM

## 2014-02-20 NOTE — Progress Notes (Signed)
*  PRELIMINARY RESULTS* Echocardiogram 2D Echocardiogram has been performed.  Leavy Cella 02/20/2014, 10:50 AM

## 2014-02-21 ENCOUNTER — Other Ambulatory Visit: Payer: Self-pay

## 2014-02-21 LAB — CBC WITH DIFFERENTIAL/PLATELET
BASOS PCT: 0 % (ref 0–1)
Basophils Absolute: 0 10*3/uL (ref 0.0–0.1)
EOS ABS: 0 10*3/uL (ref 0.0–0.7)
EOS PCT: 0 % (ref 0–5)
HCT: 26.3 % — ABNORMAL LOW (ref 39.0–52.0)
Hemoglobin: 8.9 g/dL — ABNORMAL LOW (ref 13.0–17.0)
Lymphocytes Relative: 12 % (ref 12–46)
Lymphs Abs: 1.6 10*3/uL (ref 0.7–4.0)
MCH: 28.2 pg (ref 26.0–34.0)
MCHC: 33.8 g/dL (ref 30.0–36.0)
MCV: 83.2 fL (ref 78.0–100.0)
Monocytes Absolute: 0.4 10*3/uL (ref 0.1–1.0)
Monocytes Relative: 3 % (ref 3–12)
Neutro Abs: 11.4 10*3/uL — ABNORMAL HIGH (ref 1.7–7.7)
Neutrophils Relative %: 85 % — ABNORMAL HIGH (ref 43–77)
Platelets: 181 10*3/uL (ref 150–400)
RBC: 3.16 MIL/uL — ABNORMAL LOW (ref 4.22–5.81)
RDW: 17 % — ABNORMAL HIGH (ref 11.5–15.5)
WBC: 13.4 10*3/uL — AB (ref 4.0–10.5)

## 2014-02-21 LAB — BASIC METABOLIC PANEL
BUN: 55 mg/dL — ABNORMAL HIGH (ref 6–23)
CHLORIDE: 87 meq/L — AB (ref 96–112)
CO2: 21 meq/L (ref 19–32)
CREATININE: 3.98 mg/dL — AB (ref 0.50–1.35)
Calcium: 8.3 mg/dL — ABNORMAL LOW (ref 8.4–10.5)
GFR calc non Af Amer: 15 mL/min — ABNORMAL LOW (ref >90)
GFR, EST AFRICAN AMERICAN: 18 mL/min — AB (ref >90)
Glucose, Bld: 275 mg/dL — ABNORMAL HIGH (ref 70–99)
Potassium: 4.3 mEq/L (ref 3.7–5.3)
Sodium: 128 mEq/L — ABNORMAL LOW (ref 137–147)

## 2014-02-21 LAB — RENAL FUNCTION PANEL
Albumin: 2.4 g/dL — ABNORMAL LOW (ref 3.5–5.2)
BUN: 56 mg/dL — AB (ref 6–23)
CALCIUM: 8.5 mg/dL (ref 8.4–10.5)
CO2: 20 mEq/L (ref 19–32)
CREATININE: 3.56 mg/dL — AB (ref 0.50–1.35)
Chloride: 88 mEq/L — ABNORMAL LOW (ref 96–112)
GFR calc Af Amer: 21 mL/min — ABNORMAL LOW (ref >90)
GFR calc non Af Amer: 18 mL/min — ABNORMAL LOW (ref >90)
GLUCOSE: 197 mg/dL — AB (ref 70–99)
PHOSPHORUS: 4.1 mg/dL (ref 2.3–4.6)
Potassium: 5.8 mEq/L — ABNORMAL HIGH (ref 3.7–5.3)
Sodium: 131 mEq/L — ABNORMAL LOW (ref 137–147)

## 2014-02-21 LAB — COMPREHENSIVE METABOLIC PANEL
ALBUMIN: 2.4 g/dL — AB (ref 3.5–5.2)
ALK PHOS: 85 U/L (ref 39–117)
ALT: 9 U/L (ref 0–53)
AST: 28 U/L (ref 0–37)
BILIRUBIN TOTAL: 0.4 mg/dL (ref 0.3–1.2)
BUN: 70 mg/dL — AB (ref 6–23)
CHLORIDE: 88 meq/L — AB (ref 96–112)
CO2: 21 meq/L (ref 19–32)
Calcium: 8.8 mg/dL (ref 8.4–10.5)
Creatinine, Ser: 4.26 mg/dL — ABNORMAL HIGH (ref 0.50–1.35)
GFR calc Af Amer: 17 mL/min — ABNORMAL LOW (ref >90)
GFR, EST NON AFRICAN AMERICAN: 14 mL/min — AB (ref >90)
Glucose, Bld: 177 mg/dL — ABNORMAL HIGH (ref 70–99)
Potassium: 4.5 mEq/L (ref 3.7–5.3)
Sodium: 132 mEq/L — ABNORMAL LOW (ref 137–147)
Total Protein: 7 g/dL (ref 6.0–8.3)

## 2014-02-21 LAB — LACTATE DEHYDROGENASE: LDH: 440 U/L — AB (ref 94–250)

## 2014-02-21 LAB — MAGNESIUM: Magnesium: 2.5 mg/dL (ref 1.5–2.5)

## 2014-02-21 LAB — PHOSPHORUS: Phosphorus: 4.5 mg/dL (ref 2.3–4.6)

## 2014-02-21 LAB — PROCALCITONIN: Procalcitonin: 2.19 ng/mL

## 2014-02-21 LAB — LACTIC ACID, PLASMA: LACTIC ACID, VENOUS: 3.1 mmol/L — AB (ref 0.5–2.2)

## 2014-02-21 LAB — URIC ACID: Uric Acid, Serum: 5.5 mg/dL (ref 4.0–7.8)

## 2014-02-21 MED ORDER — DIPHENHYDRAMINE HCL 25 MG PO CAPS
25.0000 mg | ORAL_CAPSULE | Freq: Four times a day (QID) | ORAL | Status: DC | PRN
  Administered 2014-02-21 – 2014-02-25 (×7): 25 mg via ORAL
  Filled 2014-02-21 (×7): qty 1

## 2014-02-21 NOTE — Progress Notes (Signed)
Patient ID: Douglas [name], male   DOB: [birth year], 57 y.o.   MRN: [id number]   [facility] Progress Note    Assessment/ Plan:   1. Acute renal failure: Possible ATN (SIRS from left groin wound source) vs TLS (felt unlikely by hematology). He remains anuric and with a high catabolic rate- will continue CRRT to provide clearance (highly catabolic) and then consider stopping/transition to HD. No evidence of renal recovery yet.  2. Hyperkalemia: Corrected with HD-monitor BID labs for adjustment of dialysis replacement solutions 3. Left groin wound: Plans noted to place wound-Vac. On IV vancomycin for associated cellulitis/drainage. 4. Hyponatremia: Secondary to acute renal failure and impaired water handling. Improving with dialysis.  5. Non-Hodgkin's lymphoma: On stress dose steroids, further management per hematology- staging CT postponed until patient more stable.  Subjective:   No acute events overnight- reports that he is comfortable and struggling with appetite   Objective:   BP 116/61  Pulse 93  Temp(Src) 97.6 F (36.4 C) (Oral)  Resp 18  Ht 5' 10.08" (1.78 m)  Wt 205.933 kg (454 lb)  BMI 65.00 kg/m2  SpO2 98%  Intake/Output Summary (Last 24 hours) at 02/21/14 0801 Last data filed at 02/21/14 0600  Gross per 24 hour  Intake   1453 ml  Output   2563 ml  Net  -1110 ml   Weight change: 22.933 kg (50 lb 8.9 oz)  Physical Exam: SJG:GEZMOQHUTML resting in bed- on CRRT YYT:KPTWS RRR, normal S1 and S2 Resp:CTA bilaterally, no rales/rhonchi FKC:LEXN, obese, NT Ext:RLE 1+ edema, LLE lymphedema  Imaging: US Renal  2014-03-02   CLINICAL DATA:  Evaluate for hydronephrosis  EXAM: RENAL/URINARY TRACT ULTRASOUND COMPLETE  COMPARISON:  US RENAL dated 02/03/2014; [place] ART/VEN FLOW ABD PELV DOPPLER dated 11/27/2013; US SCROTUM dated 11/27/2013  FINDINGS: Right Kidney:  Length: 11.5 cm. Echogenicity within normal limits. No mass or hydronephrosis visualized.  Left Kidney:   Length: 12.2 cm. Echogenicity within normal limits. No mass or hydronephrosis visualized.  Bladder:  Urinary bladder is decompressed, patient is status post Foley catheter insertion.  IMPRESSION: Negative renal ultrasound.   Electronically Signed   By: Margaree Mackintosh M.D.   On: 2014/03/02 16:09   Dg Chest Port 1 View  02/20/2014   CLINICAL DATA:  Shortness of breath  EXAM: PORTABLE CHEST - 1 VIEW  COMPARISON:  02-Mar-2014  FINDINGS: A temporary dialysis catheter is again noted on the right. The cardiac shadow is stable. The lungs are well aerated bilaterally without focal infiltrate or sizable effusion.  IMPRESSION: No acute abnormality noted.   Electronically Signed   By: Inez Catalina M.D.   On: 02/20/2014 08:08   Dg Chest Port 1 View  03/02/14   CLINICAL DATA:  Status post central line placement  EXAM: PORTABLE CHEST - 1 VIEW  COMPARISON:  02-Mar-2014 1251 hrs  FINDINGS: The cardiac shadow is stable. A temporary dialysis catheter is now seen in the distal superior vena cava. No pneumothorax is noted. Mild atelectatic changes are noted in the right base. This may be related to the poor inspiratory effort.  IMPRESSION: Status post temporary dialysis catheter placement without pneumothorax.   Electronically Signed   By: Inez Catalina M.D.   On: March 02, 2014 19:31   Dg Chest Portable 1 View  03-02-2014   CLINICAL DATA:  Shortness of breath. Non-Hodgkin's lymphoma. Diabetes and hypertension.  EXAM: PORTABLE CHEST - 1 VIEW  COMPARISON:  DG CHEST 2 VIEW dated 02/16/2014; DG CHEST 1 VIEW dated 02/03/2014; CT  CHEST W/CM dated 12/02/2013  FINDINGS: The heart is mildly enlarged. Right middle lobe nodule appears larger, now measuring 2.7 cm. Probable right upper lobe nodule is only partially seen, measuring approximately 1 cm in diameter radiographically. Lesion was larger based on prior CT evaluation. There is slight prominence the left hilar region but this is felt to be stable compared with prior radiographs and CT. No new  consolidation or pleural effusion.  IMPRESSION: Persistent right lung nodules. Right middle lobe nodule is larger ring compared prior studies.   Electronically Signed   By: Shon Hale M.D.   On: 11-Mar-2014 13:08    Labs: BMET  Recent Labs Lab 11-Mar-2014 1220 2014-03-11 1500 03-11-2014 2200 02/20/14 0030 02/20/14 0500 02/20/14 1720 02/21/14 0400  NA 122* 127* 124* 131* 132* 133* 132*  K 7.6* 7.1* 7.2* 5.9* 4.9 4.6 4.5  CL 82* 87* 85* 88* 84* 87* 88*  CO2 11* 12* 14* 17* 18* 21 21  GLUCOSE 187* 104* 151* 158* 163* 162* 177*  BUN 158* 152* 151* 135* 118* 91* 70*  CREATININE 7.90* 7.71* 7.57* 6.65* 6.14* 5.04* 4.26*  CALCIUM 11.5* 11.0* 10.5 9.8 9.5 9.1 8.8  PHOS  --  8.4*  --   --  7.5* 5.8* 4.5   CBC  Recent Labs Lab 2014-03-11 1220 02/20/14 0500 02/21/14 0400  WBC 14.6* 12.7* 13.4*  NEUTROABS 12.8*  --  11.4*  HGB 9.8* 9.1* 8.9*  HCT 28.4* 26.4* 26.3*  MCV 82.6 81.7 83.2  PLT 256 231 181   Medications:    . Chlorhexidine Gluconate Cloth  6 each Topical Q0600  . heparin subcutaneous  5,000 Units Subcutaneous 3 times per day  . hydrocortisone sod succinate (SOLU-CORTEF) inj  50 mg Intravenous Q6H  . mupirocin ointment  1 application Nasal BID  . sodium chloride  3 mL Intravenous Q12H  . vancomycin  1,500 mg Intravenous Q24H   Elmarie Shiley, MD 02/21/2014, 8:01 AM

## 2014-02-21 NOTE — Progress Notes (Signed)
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   SIGNIFICANT EVENTS / STUDIES:  4/3  Hyperkalemia, hyperuricemia, hyperphosphatemia, with ARF requiring emergent CRRT. UA with urate crystals. Renal US normal. No pulmonary edema on CXR. Heparin gtt started due to concern for PE. Started IV vanc for left groin wound. 4/4  ARF thought to be due to ATN from severe sepsis, per oncology not tumor lysis syndrome. No DVT, heparin gtt stopped. 2D-echo normal.  4/5 Continue CRRT, to transition to HD,  no renal recovery yet. To have wound vac placed today (?). Hyperuricemia resolved.  LINES / TUBES: HD catheter 4/3 >>  CULTURES: MRSA PCR 4/3 Postive Wound 4/3>> BC 4/5>>  ANTIBIOTICS: Vanc 4/3 >>   SUBJECTIVE:    No complaints. Reports shortness of breath is better and wound drainage and odor is improved.   sTaff MD: CCS thinking if volume from groin fistula caused ATN. Not on pressors. Staying in ICU due to CRRT. Still anuric  VITAL SIGNS: Temp:  [97.4 F (36.3 C)-98.1 F (36.7 C)] 97.5 F (36.4 C) (04/05 0400) Pulse Rate:  [87-105] 93 (04/05 0700) Resp:  [12-26] 18 (04/05 0700) BP: (78-130)/(44-85) 116/61 mmHg (04/05 0700) SpO2:  [97 %-100 %] 98 % (04/05 0700) Weight:  [205.933 kg (454 lb)] 205.933 kg (454 lb) (04/05 0500) HEMODYNAMICS:   VENTILATOR SETTINGS:   INTAKE / OUTPUT: Intake/Output     04/04  0701 - 04/05 0700 04/05 0701 - 04/06 0700   P.O. 640    I.V. (mL/kg) 456 (2.2)    IV Piggyback 500    Total Intake(mL/kg) 1596 (7.8)    Urine (mL/kg/hr) 13 (0)    Other 2726 (0.6)    Total Output 2739     Net -1143            PHYSICAL EXAMINATION: General: NAD, morbidly obese Neuro: Moving extremities, normal strength   HEENT:  Normocephalic  Cardiovascular:  Normal rate and rhythm, distant heart sounds Lungs: clear to ausculation anteriorly  Abdomen: lower abdominal tenderness, normal BS, no guarding Musculoskeletal:  Left +3 LE pitting edema up to thigh Skin:  Left groin wound with drainage of lymph  LABS:   PULMONARY No results found for this basename: PHART, PCO2, PCO2ART, PO2, PO2ART, HCO3, TCO2, O2SAT,  in the last 168 hours  CBC  Recent Labs Lab 02/28/2014 1220 02/20/14 0500 02/21/14 0400  HGB 9.8* 9.1* 8.9*  HCT 28.4* 26.4* 26.3*  WBC 14.6* 12.7* 13.4*  PLT 256 231 181    COAGULATION  Recent Labs Lab 02/22/2014 1220 02/20/14 0615  INR 1.21 1.19    CARDIAC    Recent Labs Lab 02/23/2014 1220  TROPONINI <0.30    Recent Labs Lab 03/02/2014 1220  PROBNP 937.1*     CHEMISTRY  Recent Labs Lab 02/26/2014 1220 02/24/2014 1500 03/12/2014 2200 02/20/14 0030 02/20/14 0500 02/20/14 1720 02/21/14 0400  NA 122* 127* 124* 131* 132* 133* 132*  K 7.6* 7.1*  7.2* 5.9* 4.9 4.6 4.5  CL 82* 87* 85* 88* 84* 87* 88*  CO2 11* 12* 14* 17* 18* 21 21  GLUCOSE 187* 104* 151* 158* 163* 162* 177*  BUN 158* 152* 151* 135* 118* 91* 70*  CREATININE 7.90* 7.71* 7.57* 6.65* 6.14* 5.04* 4.26*  CALCIUM 11.5* 11.0* 10.5 9.8 9.5 9.1 8.8  MG  --   --   --   --  2.6*  --  2.5  PHOS  --  8.4*  --   --  7.5* 5.8* 4.5   Estimated Creatinine Clearance: 34.6 ml/min (by C-G formula based on Cr of 4.26).   LIVER  Recent Labs Lab 03/17/2014 1220 02/20/14 0500 02/20/14 0615 02/20/14 1720 02/21/14 0400  AST  --   --   --   --  28  ALT  --   --   --   --  9  ALKPHOS  --   --    --   --  85  BILITOT  --   --   --   --  0.4  PROT  --   --   --   --  7.0  ALBUMIN  --  2.6*  --  2.5* 2.4*  INR 1.21  --  1.19  --   --      INFECTIOUS  Recent Labs Lab 02/20/14 0500  LATICACIDVEN 4.8*  PROCALCITON 2.58     ENDOCRINE CBG (last 3)   Recent Labs  02/27/2014 1707 03/09/2014 1906  GLUCAP 124* 133*        IMAGING x48h  US Renal  03/09/2014   CLINICAL DATA:  Evaluate for hydronephrosis  EXAM: RENAL/URINARY TRACT ULTRASOUND COMPLETE  COMPARISON:  US RENAL dated 02/03/2014; [place] ART/VEN FLOW ABD PELV DOPPLER dated 11/27/2013; US SCROTUM dated 11/27/2013  FINDINGS: Right Kidney:  Length: 11.5 cm. Echogenicity within normal limits. No mass or hydronephrosis visualized.  Left Kidney:  Length: 12.2 cm. Echogenicity within normal limits. No mass or hydronephrosis visualized.  Bladder:  Urinary bladder is decompressed, patient is status post Foley catheter insertion.  IMPRESSION: Negative renal ultrasound.   Electronically Signed   By: Margaree Mackintosh M.D.   On: 02/20/2014 16:09   Dg Chest Port 1 View  02/20/2014   CLINICAL DATA:  Shortness of breath  EXAM: PORTABLE CHEST - 1 VIEW  COMPARISON:  02/28/2014  FINDINGS: A temporary dialysis catheter is again noted on the right. The cardiac shadow is stable. The lungs are well aerated bilaterally without focal infiltrate or sizable effusion.  IMPRESSION: No acute abnormality noted.   Electronically Signed   By: Inez Catalina M.D.   On: 02/20/2014 08:08   Dg Chest Port 1 View  02/20/2014   CLINICAL DATA:  Status post central line placement  EXAM: PORTABLE CHEST - 1 VIEW  COMPARISON:  02/22/2014 1251 hrs  FINDINGS: The cardiac shadow is stable. A temporary dialysis catheter is now seen in the distal superior vena cava. No pneumothorax is noted. Mild atelectatic changes are noted in the right base. This may be related to the poor inspiratory effort.  IMPRESSION: Status post temporary dialysis catheter placement without pneumothorax.    Electronically Signed   By: Inez Catalina M.D.   On: 03/17/2014 19:31   Dg Chest Portable 1 View  02/25/2014   CLINICAL DATA:  Shortness of breath. Non-Hodgkin's lymphoma. Diabetes and hypertension.  EXAM: PORTABLE CHEST - 1 VIEW  COMPARISON:  DG CHEST 2 VIEW dated 02/16/2014; DG CHEST 1 VIEW  dated 02/03/2014; CT CHEST W/CM dated 12/02/2013  FINDINGS: The heart is mildly enlarged. Right middle lobe nodule appears larger, now measuring 2.7 cm. Probable right upper lobe nodule is only partially seen, measuring approximately 1 cm in diameter radiographically. Lesion was larger based on prior CT evaluation. There is slight prominence the left hilar region but this is felt to be stable compared with prior radiographs and CT. No new consolidation or pleural effusion.  IMPRESSION: Persistent right lung nodules. Right middle lobe nodule is larger ring compared prior studies.   Electronically Signed   By: Shon Hale M.D.   On: 03/08/2014 13:08      ASSESSMENT / PLAN:  PULMONARY A: Dyspnea - improved, currently on normal SpO2 on RA Pulmonary Nodules  OSA  P:   -O2 to keep SpO2> 92% -CPAP at night -F/u nodules as outpatient  CARDIOVASCULAR A: No active issues P:  -Monitor on telemetry  -SQ Heparin for DVT ppx  RENAL A:  Oliguric Acute renal failure - stage 4 - etiology thought to be due  to ATN from ? Volume loss from groin fistula v  severe sepsis vs pre-renal from hypovolemia. TLS less likely per oncology.      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  - no surgery for now  Leukocytosis with neutrophilia - most likely secondary to above MRSA colonization P:   -Wound care and surgery following  -Wound vac over open lymph node today (?) -Cont IV vanc -Follow procalcitonin and lactic acid -F/u wound and BC -MRSA protocol  -Contact precautions   ENDOCRINE A: Type II Diabetes Mellitus  P:   -ISS if CBG > 180  NEUROLOGIC A: No acute issues P:   -Continue to monitor   Juluis Mire PGY-1 IMTS 02/21/2014 7:29 AM  STAFF NOTE: I, Dr Ann Lions have personally reviewed patient's available data, including medical history, events of note, physical examination and test results as part of my evaluation. I have discussed with resident/NP and other care providers such as pharmacist, RN and RRT.  In addition,  I personally evaluated patient and elicited key findings of acute renal failure ? Due to volume loss from groin fistula v sepsis. TLS thought less likely. In ICU for CRRT.  Rest per NP/medical resident whose note is outlined above and that I agree with  The patient is critically ill with multiple organ systems failure and requires high complexity decision making for assessment and support, frequent evaluation and titration of therapies, application of advanced monitoring technologies and extensive interpretation of multiple databases.   Critical Care Time devoted to patient care services described in this note is  30  Minutes.  Dr. Brand Males, M.D., [facility].C.P Pulmonary and Critical Care Medicine Staff Physician [facility] Pager: [phone], If no answer or between  15:00h - 7:00h: call [phone]  02/21/2014 10:53 AM

## 2014-02-22 ENCOUNTER — Other Ambulatory Visit: Payer: BC Managed Care – PPO

## 2014-02-22 ENCOUNTER — Encounter (INDEPENDENT_AMBULATORY_CARE_PROVIDER_SITE_OTHER): Payer: BC Managed Care – PPO | Admitting: General Surgery

## 2014-02-22 DIAGNOSIS — D62 Acute posthemorrhagic anemia: Secondary | ICD-10-CM

## 2014-02-22 LAB — SODIUM, URINE, RANDOM

## 2014-02-22 LAB — CBC WITH DIFFERENTIAL/PLATELET
BASOS PCT: 0 % (ref 0–1)
Basophils Absolute: 0 10*3/uL (ref 0.0–0.1)
Eosinophils Absolute: 0 10*3/uL (ref 0.0–0.7)
Eosinophils Relative: 0 % (ref 0–5)
HEMATOCRIT: 24.5 % — AB (ref 39.0–52.0)
HEMOGLOBIN: 8.1 g/dL — AB (ref 13.0–17.0)
Lymphocytes Relative: 7 % — ABNORMAL LOW (ref 12–46)
Lymphs Abs: 1 10*3/uL (ref 0.7–4.0)
MCH: 28.1 pg (ref 26.0–34.0)
MCHC: 33.1 g/dL (ref 30.0–36.0)
MCV: 85.1 fL (ref 78.0–100.0)
MONOS PCT: 4 % (ref 3–12)
Monocytes Absolute: 0.6 10*3/uL (ref 0.1–1.0)
NEUTROS ABS: 12.6 10*3/uL — AB (ref 1.7–7.7)
NEUTROS PCT: 89 % — AB (ref 43–77)
Platelets: 156 10*3/uL (ref 150–400)
RBC: 2.88 MIL/uL — ABNORMAL LOW (ref 4.22–5.81)
RDW: 17.1 % — ABNORMAL HIGH (ref 11.5–15.5)
WBC: 14.3 10*3/uL — ABNORMAL HIGH (ref 4.0–10.5)

## 2014-02-22 LAB — URIC ACID: Uric Acid, Serum: 5.2 mg/dL (ref 4.0–7.8)

## 2014-02-22 LAB — LACTATE DEHYDROGENASE: LDH: 434 U/L — ABNORMAL HIGH (ref 94–250)

## 2014-02-22 LAB — COMPREHENSIVE METABOLIC PANEL
ALT: 9 U/L (ref 0–53)
AST: 26 U/L (ref 0–37)
Albumin: 2.3 g/dL — ABNORMAL LOW (ref 3.5–5.2)
Alkaline Phosphatase: 78 U/L (ref 39–117)
BUN: 62 mg/dL — ABNORMAL HIGH (ref 6–23)
CO2: 20 mEq/L (ref 19–32)
CREATININE: 4.48 mg/dL — AB (ref 0.50–1.35)
Calcium: 8.7 mg/dL (ref 8.4–10.5)
Chloride: 88 mEq/L — ABNORMAL LOW (ref 96–112)
GFR calc non Af Amer: 13 mL/min — ABNORMAL LOW (ref >90)
GFR, EST AFRICAN AMERICAN: 16 mL/min — AB (ref >90)
Glucose, Bld: 203 mg/dL — ABNORMAL HIGH (ref 70–99)
Potassium: 4.3 mEq/L (ref 3.7–5.3)
SODIUM: 130 meq/L — AB (ref 137–147)
TOTAL PROTEIN: 6.5 g/dL (ref 6.0–8.3)
Total Bilirubin: 0.3 mg/dL (ref 0.3–1.2)

## 2014-02-22 LAB — WOUND CULTURE: Gram Stain: NONE SEEN

## 2014-02-22 LAB — RENAL FUNCTION PANEL
ALBUMIN: 2.4 g/dL — AB (ref 3.5–5.2)
BUN: 67 mg/dL — ABNORMAL HIGH (ref 6–23)
CHLORIDE: 87 meq/L — AB (ref 96–112)
CO2: 19 meq/L (ref 19–32)
Calcium: 9.1 mg/dL (ref 8.4–10.5)
Creatinine, Ser: 4.92 mg/dL — ABNORMAL HIGH (ref 0.50–1.35)
GFR, EST AFRICAN AMERICAN: 14 mL/min — AB (ref >90)
GFR, EST NON AFRICAN AMERICAN: 12 mL/min — AB (ref >90)
Glucose, Bld: 172 mg/dL — ABNORMAL HIGH (ref 70–99)
POTASSIUM: 4.3 meq/L (ref 3.7–5.3)
Phosphorus: 4 mg/dL (ref 2.3–4.6)
SODIUM: 131 meq/L — AB (ref 137–147)

## 2014-02-22 LAB — LACTIC ACID, PLASMA: Lactic Acid, Venous: 4.3 mmol/L — ABNORMAL HIGH (ref 0.5–2.2)

## 2014-02-22 LAB — GLUCOSE, CAPILLARY
GLUCOSE-CAPILLARY: 186 mg/dL — AB (ref 70–99)
Glucose-Capillary: 145 mg/dL — ABNORMAL HIGH (ref 70–99)

## 2014-02-22 LAB — HEPATITIS B SURFACE ANTIGEN: Hepatitis B Surface Ag: NEGATIVE

## 2014-02-22 LAB — PROCALCITONIN: Procalcitonin: 2.42 ng/mL

## 2014-02-22 LAB — VANCOMYCIN, RANDOM: Vancomycin Rm: 34 ug/mL

## 2014-02-22 LAB — MAGNESIUM: Magnesium: 2.5 mg/dL (ref 1.5–2.5)

## 2014-02-22 LAB — GLUCOSE 6 PHOSPHATE DEHYDROGENASE: G6PDH: 19 U/g{Hb} (ref 7.0–20.5)

## 2014-02-22 LAB — HEPATITIS B SURFACE ANTIBODY,QUALITATIVE: Hep B S Ab: NEGATIVE

## 2014-02-22 LAB — HEPATITIS B CORE ANTIBODY, IGM: HEP B C IGM: NONREACTIVE

## 2014-02-22 MED ORDER — OXYCODONE-ACETAMINOPHEN 5-325 MG PO TABS
1.0000 | ORAL_TABLET | Freq: Four times a day (QID) | ORAL | Status: DC | PRN
  Administered 2014-02-22 (×3): 1 via ORAL
  Administered 2014-02-23 – 2014-02-27 (×5): 2 via ORAL
  Administered 2014-03-03: 1 via ORAL
  Administered 2014-03-04 – 2014-03-12 (×9): 2 via ORAL
  Administered 2014-03-14: 1 via ORAL
  Administered 2014-03-15 – 2014-03-20 (×8): 2 via ORAL
  Administered 2014-03-23: 1 via ORAL
  Administered 2014-03-24 – 2014-03-25 (×3): 2 via ORAL
  Filled 2014-02-22: qty 1
  Filled 2014-02-22 (×20): qty 2
  Filled 2014-02-22: qty 1
  Filled 2014-02-22: qty 2
  Filled 2014-02-22: qty 1
  Filled 2014-02-22 (×7): qty 2

## 2014-02-22 MED ORDER — PANTOPRAZOLE SODIUM 40 MG PO TBEC
40.0000 mg | DELAYED_RELEASE_TABLET | Freq: Every day | ORAL | Status: DC
  Administered 2014-02-22 – 2014-03-26 (×30): 40 mg via ORAL
  Filled 2014-02-22 (×26): qty 1

## 2014-02-22 MED ORDER — OXYCODONE-ACETAMINOPHEN 5-325 MG PO TABS
ORAL_TABLET | ORAL | Status: AC
  Filled 2014-02-22: qty 1

## 2014-02-22 MED ORDER — INSULIN ASPART 100 UNIT/ML ~~LOC~~ SOLN
0.0000 [IU] | SUBCUTANEOUS | Status: DC
  Administered 2014-02-22 – 2014-02-23 (×2): 2 [IU] via SUBCUTANEOUS
  Administered 2014-02-23: 1 [IU] via SUBCUTANEOUS
  Administered 2014-02-23: 2 [IU] via SUBCUTANEOUS
  Administered 2014-02-23 (×2): 1 [IU] via SUBCUTANEOUS
  Administered 2014-02-24 (×2): 2 [IU] via SUBCUTANEOUS
  Administered 2014-02-24: 1 [IU] via SUBCUTANEOUS
  Administered 2014-02-24: 3 [IU] via SUBCUTANEOUS
  Administered 2014-02-24: 1 [IU] via SUBCUTANEOUS
  Administered 2014-02-25: 2 [IU] via SUBCUTANEOUS
  Administered 2014-02-25 (×3): 1 [IU] via SUBCUTANEOUS
  Administered 2014-02-25 (×2): 2 [IU] via SUBCUTANEOUS
  Administered 2014-02-26: 1 [IU] via SUBCUTANEOUS
  Administered 2014-02-26: 2 [IU] via SUBCUTANEOUS
  Administered 2014-02-26: 1 [IU] via SUBCUTANEOUS
  Administered 2014-02-26: 3 [IU] via SUBCUTANEOUS
  Administered 2014-02-27: 2 [IU] via SUBCUTANEOUS
  Administered 2014-02-27 (×2): 1 [IU] via SUBCUTANEOUS
  Administered 2014-02-27: 2 [IU] via SUBCUTANEOUS
  Administered 2014-02-27 – 2014-02-28 (×2): 1 [IU] via SUBCUTANEOUS
  Administered 2014-02-28: 2 [IU] via SUBCUTANEOUS
  Administered 2014-02-28 (×2): 1 [IU] via SUBCUTANEOUS
  Administered 2014-02-28: 2 [IU] via SUBCUTANEOUS
  Administered 2014-03-01 – 2014-03-02 (×5): 1 [IU] via SUBCUTANEOUS
  Administered 2014-03-02 (×2): 2 [IU] via SUBCUTANEOUS
  Administered 2014-03-02 – 2014-03-05 (×9): 1 [IU] via SUBCUTANEOUS
  Administered 2014-03-06: 2 [IU] via SUBCUTANEOUS
  Administered 2014-03-06 – 2014-03-07 (×5): 1 [IU] via SUBCUTANEOUS
  Administered 2014-03-07: 16:00:00 via SUBCUTANEOUS
  Administered 2014-03-07: 1 [IU] via SUBCUTANEOUS
  Administered 2014-03-09: 2 [IU] via SUBCUTANEOUS
  Administered 2014-03-09 (×2): 1 [IU] via SUBCUTANEOUS
  Administered 2014-03-09: 2 [IU] via SUBCUTANEOUS
  Administered 2014-03-10 (×2): 1 [IU] via SUBCUTANEOUS
  Administered 2014-03-10: 3 [IU] via SUBCUTANEOUS
  Administered 2014-03-10 – 2014-03-11 (×3): 1 [IU] via SUBCUTANEOUS
  Administered 2014-03-11: 2 [IU] via SUBCUTANEOUS
  Administered 2014-03-11: 1 [IU] via SUBCUTANEOUS
  Administered 2014-03-11: 3 [IU] via SUBCUTANEOUS

## 2014-02-22 MED ORDER — INSULIN ASPART 100 UNIT/ML ~~LOC~~ SOLN: 0.0000 [IU] | SUBCUTANEOUS | Status: DC

## 2014-02-22 MED ORDER — HYDROCORTISONE NA SUCCINATE PF 100 MG IJ SOLR
25.0000 mg | Freq: Two times a day (BID) | INTRAMUSCULAR | Status: DC
  Administered 2014-02-23 – 2014-02-24 (×3): 25 mg via INTRAVENOUS
  Filled 2014-02-22 (×6): qty 0.5

## 2014-02-22 MED ORDER — HYDROCORTISONE NA SUCCINATE PF 100 MG IJ SOLR: 50.0000 mg | Freq: Two times a day (BID) | INTRAMUSCULAR | Status: DC

## 2014-02-22 NOTE — Progress Notes (Signed)
Patient's CPAP machine is set up, ready for use in patient room. Patient confirms that he knows how to use the machine and will go on and off as he wishes. His current settings are a CPAP pressure of 9.5 (per home regimen) with 3 lpm o2 bleed in. Patient is currently comfortable,in no apparent distress and is aware to call RT if he does need any help at all with the machine. RT will continue to assist as needed.

## 2014-02-22 NOTE — Telephone Encounter (Signed)
Agree 

## 2014-02-22 NOTE — Progress Notes (Signed)
ANTIBIOTIC CONSULT NOTE - INITIAL  Pharmacy Consult for Vancomycin Indication: Draining wound  No Known Allergies  Patient Measurements: Height: 5' 10.08" (178 cm) Weight: 467 lb (211.83 kg) IBW/kg (Calculated) : 73.18  Vital Signs: Temp: 98.8 F (37.1 C) (04/06 0800) Temp src: Oral (04/06 1400) BP: 132/77 mmHg (04/06 1400) Pulse Rate: 96 (04/06 1400) Intake/Output from previous day: 04/05 0701 - 04/06 0700 In: 2453 [P.O.:1450; I.V.:3; IV Piggyback:500] Out: 594  Intake/Output from this shift: Total I/O In: 360 [P.O.:360] Out: 30 [Urine:30]  Labs:  Recent Labs  02/20/14 0500  02/21/14 0400 02/21/14 1729 02/21/14 2203 02/22/14 0500  WBC 12.7*  --  13.4*  --   --  14.3*  HGB 9.1*  --  8.9*  --   --  8.1*  PLT 231  --  181  --   --  156  CREATININE 6.14*  < > 4.26* 3.56* 3.98* 4.48*  < > = values in this interval not displayed. Estimated Creatinine Clearance: 33.5 ml/min (by C-G formula based on Cr of 4.48).  Recent Labs  02/22/14 1218  VANCORANDOM 34.0     Microbiology: Recent Results (from the past 720 hour(s))  CULTURE, ROUTINE-ABSCESS     Status: None   Collection Time    02/03/14 12:11 PM      Result Value Ref Range Status   Specimen Description ABSCESS LEFT GROIN   Final   Special Requests NONE   Final   Gram Stain     Final   Value: ABUNDANT WBC PRESENT,BOTH PMN AND MONONUCLEAR     NO SQUAMOUS EPITHELIAL CELLS SEEN     ABUNDANT GRAM POSITIVE COCCI IN PAIRS     MODERATE GRAM NEGATIVE RODS     Performed at [facility]   Culture     Final   Value: MODERATE ESCHERICHIA COLI     MODERATE METHICILLIN RESISTANT STAPHYLOCOCCUS AUREUS     Note: RIFAMPIN AND GENTAMICIN SHOULD NOT BE USED AS SINGLE DRUGS FOR TREATMENT OF STAPH INFECTIONS.     Performed at [facility]   Report Status 02/07/2014 FINAL   Final   Organism ID, Bacteria ESCHERICHIA COLI   Final   Organism ID, Bacteria METHICILLIN RESISTANT STAPHYLOCOCCUS AUREUS   Final  MRSA  PCR SCREENING     Status: Abnormal   Collection Time    03/04/2014  5:27 PM      Result Value Ref Range Status   MRSA by PCR POSITIVE (*) NEGATIVE Final   Comment:            The GeneXpert MRSA Assay (FDA     approved for NASAL specimens     only), is one component of a     comprehensive MRSA colonization     surveillance program. It is not     intended to diagnose MRSA     infection nor to guide or     monitor treatment for     MRSA infections.     RESULT CALLED TO, READ BACK BY AND VERIFIED WITH:     Linda Hedges RN 2154 03/10/2014 A BROWNING  WOUND CULTURE     Status: None   Collection Time    03/18/2014  5:27 PM      Result Value Ref Range Status   Specimen Description WOUND   Final   Special Requests IMMUNOCOMPROMISED LEFT INGUINAL AREA   Final   Gram Stain     Final   Value: NO WBC SEEN  NO SQUAMOUS EPITHELIAL CELLS SEEN     RARE GRAM NEGATIVE RODS     Performed at [facility]   Culture     Final   Value: MULTIPLE ORGANISMS PRESENT, NONE PREDOMINANT     Note: NO STAPHYLOCOCCUS AUREUS ISOLATED NO GROUP A STREP (S.PYOGENES) ISOLATED     Performed at [facility]   Report Status 02/22/2014 FINAL   Final    Medical History: Past Medical History  Diagnosis Date  . Non Hodgkin's lymphoma January 2009  . Diabetes mellitus without complication   . Hypertension   . Sleep apnea   . Lymphadenopathy, inguinal     left    Medications:  Prescriptions prior to admission  Medication Sig Dispense Refill  . Alogliptin-Pioglitazone (OSENI) 25-15 MG TABS Take 1 tablet by mouth daily.      [name] docusate sodium (COLACE) 100 MG capsule Take 100 mg by mouth 2 (two) times daily.      . Ferrous Sulfate (IRON) 325 (65 FE) MG TABS Take 325 mg by mouth 2 (two) times daily.  60 each  0  . Hydrocortisone-Aloe Vera (CORTIZONE-10 INTENSIVE HEALING) 1 % CREA Apply 1 application topically as needed (For itching).      [name] omeprazole (PRILOSEC) 40 MG capsule Take 1 capsule (40 mg total) by  mouth 2 (two) times daily.  60 capsule  0  . oxyCODONE-acetaminophen (PERCOCET) 10-325 MG per tablet Take 1 tablet by mouth every 6 (six) hours as needed for pain.  60 tablet  0  . polyethylene glycol (MIRALAX / GLYCOLAX) packet Take 17 g by mouth daily as needed for mild constipation.       . prochlorperazine (COMPAZINE) 10 MG tablet Take 1 tablet (10 mg total) by mouth every 6 (six) hours as needed for nausea or vomiting.  60 tablet  0   Scheduled:  . Chlorhexidine Gluconate Cloth  6 each Topical Q0600  . heparin subcutaneous  5,000 Units Subcutaneous 3 times per day  . hydrocortisone sod succinate (SOLU-CORTEF) inj  25 mg Intravenous Q12H  . insulin aspart  0-9 Units Subcutaneous 6 times per day  . mupirocin ointment  1 application Nasal BID  . pantoprazole  40 mg Oral Q1200  . sodium chloride  3 mL Intravenous Q12H     Assessment: 58 yo M who was tx from [facility] on 4/3 with SOB, hyperkalemia and ARF requiring emergent CRRT. Vancomycin was started for left groin draining wound on 4/3. CRRT discontinued on 4/5, for iHD today. Currently afebrile, WBC elevated, Pct and LA elevated. Random vanc level today elevated to 34 mcg/mL.  4/3 Wound >> mult, none predominant 4/3 MRSA PCR + 4/5 BCx2>>  Goal of Therapy:  Pre-HD level 15-25 mcg/mL Post-HD level 5-15 mcg/mL  Plan:  - Hold vancomycin today - Will re-dose based on tolerance of HD and random level tomorrow am  - Continue to monitor temp, WBC, C&S, clinical improvement  Harolyn Rutherford, PharmD Clinical Pharmacist - Resident Pager: [phone] Pharmacy: [phone] 02/22/2014 4:00 PM

## 2014-02-22 NOTE — Progress Notes (Signed)
Not much more for [place] to do in surgery.  Kathryne Eriksson. Dahlia Bailiff, MD, [place] [phone] [facility] Surgery

## 2014-02-22 NOTE — Procedures (Signed)
I was present at this session.  I have reviewed the session itself and made appropriate changes.  HD via temp cath bp ok.    Mykaila Blunck,Friend L 4/6/20154:10 PM

## 2014-02-22 NOTE — Consult Note (Signed)
WOC wound consult note Reason for Consult: placement of NPWT VAC dressing to the left groin site Wound type:surgical Measurement: 3.0 cm x 6.0 cm x 3.0cm ; tunnel at 9 o'clock (8cm); tunnel at 3 o'clock (2cm)  Wound bed: clean, pink, moist, 100% granulation tissue Drainage (amount, consistency, odor) minimal noted on dressing at the time of the [facility] placement today Periwound:induration surrounds the entire wound bed, but this is unchanged.  Dressing procedure/placement/frequency: 3pc of black granufoam placed in the wound 1 in each tunnel and the last to fill the wound bed. Seal at 141mmHG. Pt tolerated well.  [facility] team will continue to maintain VAC dressing due to tunnels   Discussed POC with patient and bedside nurse.   Thanks  Zaydin Billey Kellogg, [place] [phone])

## 2014-02-22 NOTE — Progress Notes (Signed)
  Subjective: No complaints, off dialysis right now.    Objective: Vital signs in last 24 hours: Temp:  [98.7 F (37.1 C)-98.8 F (37.1 C)] 98.8 F (37.1 C) (04/06 0800) Pulse Rate:  [87-106] 91 (04/06 0900) Resp:  [12-22] 15 (04/06 0900) BP: (87-125)/(36-70) 118/70 mmHg (04/06 0900) SpO2:  [90 %-100 %] 98 % (04/06 0900) Weight:  [211.83 kg (467 lb)] 211.83 kg (467 lb) (04/06 0404) Last BM Date: 02/20/14 Afebrile, VSS Creatinine is dow to 4.48  Intake/Output from previous day: 04/05 0701 - 04/06 0700 In: 2453 [P.O.:1450; I.V.:3; IV Piggyback:500] Out: 594  Intake/Output this shift: Total I/O In: 240 [P.O.:240] Out: 30 [Urine:30]  General appearance: alert, cooperative and no distress Open site looks fine, we are placing a wound vac, there is less drainage right now than what he has had in past viewings of the wound.  Lab Results:   Recent Labs  02/21/14 0400 02/22/14 0500  WBC 13.4* 14.3*  HGB 8.9* 8.1*  HCT 26.3* 24.5*  PLT 181 156    BMET  Recent Labs  02/21/14 2203 02/22/14 0500  NA 128* 130*  K 4.3 4.3  CL 87* 88*  CO2 21 20  GLUCOSE 275* 203*  BUN 55* 62*  CREATININE 3.98* 4.48*  CALCIUM 8.3* 8.7   PT/INR  Recent Labs  03/11/2014 1220 02/20/14 0615  LABPROT 15.0 14.8  INR 1.21 1.19     Recent Labs Lab 02/20/14 0500 02/20/14 1720 02/21/14 0400 02/21/14 1729 02/22/14 0500  AST  --   --  28  --  26  ALT  --   --  9  --  9  ALKPHOS  --   --  85  --  78  BILITOT  --   --  0.4  --  0.3  PROT  --   --  7.0  --  6.5  ALBUMIN 2.6* 2.5* 2.4* 2.4* 2.3*     Lipase  No results found for this basename: lipase     Studies/Results: No results found.  Medications: . Chlorhexidine Gluconate Cloth  6 each Topical Q0600  . heparin subcutaneous  5,000 Units Subcutaneous 3 times per day  . hydrocortisone sod succinate (SOLU-CORTEF) inj  50 mg Intravenous Q6H  . mupirocin ointment  1 application Nasal BID  . sodium chloride  3 mL  Intravenous Q12H  . vancomycin  1,500 mg Intravenous Q24H    Assessment/Plan . Open biopsy site; left groin with Non Hodgkin's lymphoma. Ongoing primarily clear drainage. 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 Culture yesterday shows just a few Gm Negative rods  2. Hypotension  3. Acute renal failure- creatinine up to 7.57  4. Hyperkalemia - K+7.2  5. AODM  6. Hypertension  7. Sleep Apnea  8. BMI 61   Plan:  Wound vac  LOS: 3 days    Jawanza Zambito 02/22/2014

## 2014-02-22 NOTE — Progress Notes (Signed)
Pt admitted to the unit from HD, transfer from 31M. Pt is alert and oriented. Pt oriented to room, staff, and call bell. Educated on fall safety plan. Bed in lowest position. Full assessment to Epic. Call bell with in reach. Educated to call for assists. Will continue to monitor. Mady Gemma, RN

## 2014-02-22 NOTE — Care Management Note (Signed)
    Page 1 of 1   02/22/2014     3:02:50 PM   CARE MANAGEMENT NOTE 02/22/2014  Patient:  Douglas [name],Douglas [name]   Account Number:  [id number]  Date Initiated:  02/22/2014  Documentation initiated by:  Luz Lex  Subjective/Objective Assessment:   AKI - hyperkalemia requiring emergent CRRT.     Action/Plan:   Anticipated DC Date:  02/25/2014   Anticipated DC Plan:  [place]  CM consult      Choice offered to / List presented to:             Status of service:  In process, will continue to follow Medicare Important Message given?   (If response is "NO", the following Medicare IM given date fields will be blank) Date Medicare IM given:   Date Additional Medicare IM given:    Discharge Disposition:    Per UR Regulation:  Reviewed for med. necessity/level of care/duration of stay  If discussed at [place] of Stay Meetings, dates discussed:    Comments:  ContactBarbaraann Cao Sister [place] [phone]                  [name],Lucy Sister [phone]  02-22-14 2:45pm Luz Lex, RNBSN - [phone] hyperkalemia in non [facility] patient.  Started on CRRT.  Has been home with [facility] - RN following.  Will need resumption of HH RN on discharge to continue this service.    Talked with patient - lives at home with GF - Donia Guiles.  Does have HH with [facility] which he would like to continue.  At this time has a wound VAC to groin - changing M-W-F.  VAC form placed on chart.

## 2014-02-22 NOTE — Progress Notes (Signed)
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   SIGNIFICANT EVENTS / STUDIES:  4/3  Hyperkalemia, hyperuricemia, hyperphosphatemia, with ARF requiring emergent CRRT. UA with urate crystals. Renal US normal. No pulmonary edema on CXR. Heparin gtt started due to concern for PE. Started IV vanc for left groin wound. 4/4  ARF thought to be due to ATN from severe sepsis, per oncology not tumor lysis syndrome. No DVT, heparin gtt stopped. 2D-echo normal.  4/5 Continue CRRT, to transition to HD,  no renal recovery yet. To have wound vac placed today (?). Hyperuricemia resolved. 4/6 Anuric. Wound vac to be placed today (?)  LINES / TUBES: HD RIJ catheter 4/3 >>  CULTURES: MRSA PCR 4/3 Postive Wound 4/3>> BC 4/5>>  ANTIBIOTICS: Vanc 4/3 >>   SUBJECTIVE:  Pt reports feeling well with no new complaints. To hopefully have wound vac placed today.   VITAL SIGNS: Temp:  [97.6 F (36.4 C)-98.8 F (37.1 C)] 98.7 F (37.1 C) (04/06 0404) Pulse Rate:  [87-106] 89 (04/06 0700) Resp:  [12-28] 14 (04/06 0700) BP: (68-125)/(36-82) 110/36 mmHg (04/06 0700) SpO2:  [90 %-100 %] 99 % (04/06 0700) Weight:  [211.83 kg (467 lb)] 211.83 kg (467 lb) (04/06 0404) HEMODYNAMICS:   VENTILATOR SETTINGS:   INTAKE / OUTPUT: Intake/Output     04/05 0701 - 04/06 0700 04/06 0701 - 04/07 0700   P.O. 1450    I.V. (mL/kg) 3 (0)     Other 500    IV Piggyback 500    Total Intake(mL/kg) 2453 (11.6)    Urine (mL/kg/hr)     Other 594 (0.1)    Total Output 594     Net +1859            PHYSICAL EXAMINATION: General: NAD, morbidly obese Neuro: Moving extremities, normal strength   HEENT:  Normocephalic  Cardiovascular:  Normal rate and rhythm, distant heart sounds Lungs: clear to ausculation anteriorly  Abdomen: lower abdominal tenderness, normal BS, no guarding Musculoskeletal:  Left +3 LE pitting edema up to thigh Skin:  Left groin wound with drainage  LABS:  CBC  Recent Labs Lab 02/20/14 0500 02/21/14 0400 02/22/14 0500  HGB 9.1* 8.9* 8.1*  HCT 26.4* 26.3* 24.5*  WBC 12.7* 13.4* 14.3*  PLT 231 181 156    COAGULATION  Recent Labs Lab 03/02/14 1220 02/20/14 0615  INR 1.21 1.19    CARDIAC    Recent Labs Lab 03-02-2014 1220  TROPONINI <0.30    Recent Labs Lab 2014-03-02 1220  PROBNP 937.1*     CHEMISTRY  Recent Labs Lab Mar 02, 2014 1500  02/20/14 0500 02/20/14 1720 02/21/14 0400 02/21/14 1729 02/21/14 2203 02/22/14 0500  NA 127*  < > 132* 133* 132* 131* 128* 130*  K 7.1*  < > 4.9 4.6 4.5 5.8* 4.3 4.3  CL 87*  < > 84* 87* 88* 88* 87* 88*  CO2 12*  < > 18* 21 21 20 21  20  GLUCOSE 104*  < > 163* 162* 177* 197* 275* 203*  BUN 152*  < > 118* 91* 70* 56* 55* 62*  CREATININE 7.71*  < > 6.14* 5.04* 4.26* 3.56* 3.98* 4.48*  CALCIUM 11.0*  < > 9.5 9.1 8.8 8.5 8.3* 8.7  MG  --   --  2.6*  --  2.5  --   --  2.5  PHOS 8.4*  --  7.5* 5.8* 4.5 4.1  --   --   < > = values in this interval not displayed. Estimated Creatinine Clearance: 33.5 ml/min (by C-G formula based on Cr of 4.48).   LIVER  Recent Labs Lab 02/18/2014 1220 02/20/14 0500 02/20/14 0615 02/20/14 1720 02/21/14 0400 02/21/14 1729 02/22/14 0500  AST  --   --   --   --  28  --  26  ALT  --   --   --   --  9  --  9  ALKPHOS  --   --   --   --  85  --  78  BILITOT  --   --   --   --  0.4  --  0.3  PROT  --   --   --    --  7.0  --  6.5  ALBUMIN  --  2.6*  --  2.5* 2.4* 2.4* 2.3*  INR 1.21  --  1.19  --   --   --   --      INFECTIOUS  Recent Labs Lab 02/20/14 0500 02/21/14 1729 02/22/14 0500  LATICACIDVEN 4.8* 3.1* 4.3*  PROCALCITON 2.58 2.19 2.42     ENDOCRINE CBG (last 3)   Recent Labs  03/07/2014 1707 02/20/2014 1906  GLUCAP 124* 133*      ASSESSMENT / PLAN:  PULMONARY A: Dyspnea - improved, currently on normal SpO2 on RA Pulmonary Nodules  OSA  P:   -O2 to keep SpO2> 92% -CPAP at night -F/u nodules as outpatient -pcxr in am for volume status  CARDIOVASCULAR A: No active issues P:  -Monitor on telemetry  -SQ Heparin for DVT ppx  RENAL A:  Anuric/Oliguric Acute renal failure - stage 4 -Anuric. Etiology pre-renal from volume loss from groin fistula vs severe sepsis from left groin infection. TLS less likely per oncology.     P:   -Renal following -Daily weight and strict I&O's (foley) -Monitor volume status  -Per renal whether to continue CRRP vs transition to HD, off today -Follow renal function panel in am   GASTROINTESTINAL A: History of GI bleed - currently stable< GERD? 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    And edited in [place]. Titus Mould, MD, [place] Pgr: [facility]

## 2014-02-22 NOTE — Progress Notes (Signed)
Wound is better and drainage is significantly less.  CPM.  Nothing left for surgery to do at this point.Douglas [name] Dahlia Bailiff, MD, [place] [phone] [facility]

## 2014-02-23 ENCOUNTER — Inpatient Hospital Stay (HOSPITAL_COMMUNITY): Payer: BC Managed Care – PPO

## 2014-02-23 LAB — COMPREHENSIVE METABOLIC PANEL
ALBUMIN: 2.5 g/dL — AB (ref 3.5–5.2)
ALK PHOS: 87 U/L (ref 39–117)
ALT: 12 U/L (ref 0–53)
AST: 36 U/L (ref 0–37)
BILIRUBIN TOTAL: 0.3 mg/dL (ref 0.3–1.2)
BUN: 38 mg/dL — AB (ref 6–23)
CHLORIDE: 92 meq/L — AB (ref 96–112)
CO2: 21 mEq/L (ref 19–32)
Calcium: 9.2 mg/dL (ref 8.4–10.5)
Creatinine, Ser: 3.63 mg/dL — ABNORMAL HIGH (ref 0.50–1.35)
GFR calc non Af Amer: 17 mL/min — ABNORMAL LOW (ref >90)
GFR, EST AFRICAN AMERICAN: 20 mL/min — AB (ref >90)
GLUCOSE: 134 mg/dL — AB (ref 70–99)
POTASSIUM: 3.9 meq/L (ref 3.7–5.3)
SODIUM: 135 meq/L — AB (ref 137–147)
Total Protein: 7.1 g/dL (ref 6.0–8.3)

## 2014-02-23 LAB — CBC WITH DIFFERENTIAL/PLATELET
BASOS ABS: 0 10*3/uL (ref 0.0–0.1)
Basophils Relative: 0 % (ref 0–1)
EOS PCT: 0 % (ref 0–5)
Eosinophils Absolute: 0 10*3/uL (ref 0.0–0.7)
HCT: 28.3 % — ABNORMAL LOW (ref 39.0–52.0)
Hemoglobin: 9.2 g/dL — ABNORMAL LOW (ref 13.0–17.0)
Lymphocytes Relative: 10 % — ABNORMAL LOW (ref 12–46)
Lymphs Abs: 1.3 10*3/uL (ref 0.7–4.0)
MCH: 28 pg (ref 26.0–34.0)
MCHC: 32.5 g/dL (ref 30.0–36.0)
MCV: 86.3 fL (ref 78.0–100.0)
MONO ABS: 0.6 10*3/uL (ref 0.1–1.0)
Monocytes Relative: 5 % (ref 3–12)
Neutro Abs: 11.1 10*3/uL — ABNORMAL HIGH (ref 1.7–7.7)
Neutrophils Relative %: 85 % — ABNORMAL HIGH (ref 43–77)
PLATELETS: 158 10*3/uL (ref 150–400)
RBC: 3.28 MIL/uL — ABNORMAL LOW (ref 4.22–5.81)
RDW: 17.1 % — AB (ref 11.5–15.5)
WBC: 13.1 10*3/uL — ABNORMAL HIGH (ref 4.0–10.5)

## 2014-02-23 LAB — GLUCOSE, CAPILLARY
Glucose-Capillary: 160 mg/dL — ABNORMAL HIGH (ref 70–99)
Glucose-Capillary: 133 mg/dL — ABNORMAL HIGH (ref 70–99)
Glucose-Capillary: 190 mg/dL — ABNORMAL HIGH (ref 70–99)
Glucose-Capillary: 139 mg/dL — ABNORMAL HIGH (ref 70–99)

## 2014-02-23 LAB — VANCOMYCIN, RANDOM: Vancomycin Rm: 23.5 ug/mL

## 2014-02-23 LAB — PHOSPHORUS: Phosphorus: 3 mg/dL (ref 2.3–4.6)

## 2014-02-23 LAB — LACTATE DEHYDROGENASE: LDH: 536 U/L — ABNORMAL HIGH (ref 94–250)

## 2014-02-23 LAB — HEPATITIS B CORE ANTIBODY, TOTAL
HEP B C TOTAL AB: NONREACTIVE
Hep B Core Total Ab: NONREACTIVE

## 2014-02-23 LAB — URIC ACID: Uric Acid, Serum: 3.8 mg/dL — ABNORMAL LOW (ref 4.0–7.8)

## 2014-02-23 LAB — LACTIC ACID, PLASMA: Lactic Acid, Venous: 4.1 mmol/L — ABNORMAL HIGH (ref 0.5–2.2)

## 2014-02-23 MED ORDER — VANCOMYCIN HCL 1000 MG IV SOLR
1500.0000 mg | Freq: Once | INTRAVENOUS | Status: DC
  Administered 2014-02-24: 1500 mg via INTRAVENOUS
  Filled 2014-02-23 (×2): qty 1500

## 2014-02-23 MED ORDER — RENA-VITE PO TABS
1.0000 | ORAL_TABLET | Freq: Every day | ORAL | Status: DC
  Administered 2014-02-23 – 2014-02-25 (×3): 1 via ORAL
  Administered 2014-02-26 – 2014-02-28 (×3): via ORAL
  Administered 2014-03-01 – 2014-03-15 (×15): 1 via ORAL
  Administered 2014-03-16: 23:00:00 via ORAL
  Administered 2014-03-17 – 2014-03-18 (×2): 1 via ORAL
  Administered 2014-03-19 – 2014-03-21 (×3): via ORAL
  Administered 2014-03-22: 1 via ORAL
  Administered 2014-03-23 – 2014-03-24 (×2): via ORAL
  Administered 2014-03-25: 1 via ORAL
  Filled 2014-02-23 (×35): qty 1

## 2014-02-23 NOTE — Progress Notes (Signed)
Patient ID: Douglas [name]  male  PNT:[id number]    DOB: 06-24-1956    DOA: 03/16/2014  PCP: Alice Reichert, MD  Brief interim history  Patient is a 58 year old male with history of follicular low-grade non-Hodgkin lymphoma, large left groin lymphadenopathy, diagnosed in 2000 and was on recent chemotherapy, had admission for acute renal failure 3/17- 3/20. He was suspected to have acute renal failure de to HCTZ and ARB.  On 4/3 admission, patient was noted to be hyperkalemic 7.6 , hyponatremic and a creatinine of 7.9, uric acid level of 14.3 with LDH 444.  On 3/20 cr was 1.9. He required emergent hemodialysis and was admitted by critical care service. He was transferred from [facility] to [facility] by renal service recommendations.  SIGNIFICANT EVENTS / STUDIES:  4/3 Hyperkalemia, hyperuricemia, hyperphosphatemia, with ARF requiring emergent CRRT. UA with urate crystals. Renal US normal. No pulmonary edema on CXR. Heparin gtt started due to concern for PE. Started IV vanc for left groin wound.  4/4 ARF thought to be due to ATN from severe sepsis, per oncology not tumor lysis syndrome. No DVT, heparin gtt stopped. 2D-echo normal.  4/5 Continue CRRT, to transition to HD, no renal recovery yet. Hyperuricemia resolved.  4/6 Anuric. Wound vac placed   Assessment/Plan: Principal Problem:   Acute kidney injury-potentially related to tumor lysis, stage IV:  - Etiology prerenal from volume loss from her fistula versus severe sepsis from left groin infection.  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 - Surgery following, wound VAC placed - Continue IV vancomycin   Type 2 diabetes mellitus - Continue sliding scale insulin   GERD with history of GI bleed: Continue PPI   DVT Prophylaxis: Heparin subcutaneous  Code Status: Full CODE STATUS  Family Communication:  Disposition:  Consultants:  Surgery  Nephrology  Oncology  Procedures:  Dialysis  Wound VAC  Antibiotics: Vanc 4/3 >>  Subjective: Denies any specific complaints, alert and oriented  Objective: Weight change:   Intake/Output Summary (Last 24 hours) at 02/23/14 1148 Last data filed at 02/23/14 0900  Gross per 24 hour  Intake    480 ml  Output   2075 ml  Net  -1595 ml   Blood pressure 101/52, pulse 96, temperature 98.6 F (37 C), temperature source Oral, resp. rate 18, height 5' 10.08" (1.78 m), weight 211.83 kg (467 lb), SpO2 100.00%.  Physical Exam: General: Alert and awake, oriented x3, not in any acute distress. CVS: S1-S2 clear, no murmur rubs or gallops Chest: clear to auscultation bilaterally, no wheezing, rales or rhonchi Abdomen: soft  nondistended, normal bowel sounds  Extremities: no cyanosis, clubbing. 3+ edema noted bilaterally   Lab Results: Basic Metabolic Panel:  Recent Labs Lab 02/22/14 0500 02/22/14 1600 02/23/14 0647  NA 130* 131* 135*  K 4.3 4.3 3.9  CL 88* 87* 92*  CO2 20 19 21   GLUCOSE 203* 172* 134*  BUN 62* 67* 38*  CREATININE 4.48* 4.92* 3.63*  CALCIUM 8.7 9.1 9.2  MG 2.5  --   --   PHOS  --  4.0 3.0  Liver Function Tests:  Recent Labs Lab 02/22/14 0500 02/22/14 1600 02/23/14 0647  AST 26  --  36  ALT 9  --  12  ALKPHOS 78  --  87  BILITOT 0.3  --  0.3  PROT 6.5  --  7.1  ALBUMIN 2.3* 2.4* 2.5*   No results found for this basename: LIPASE, AMYLASE,  in the last 168 hours No results found for this basename: AMMONIA,  in the last 168 hours CBC:  Recent Labs Lab  02/22/14 0500 02/23/14 0647  WBC 14.3* 13.1*  NEUTROABS 12.6* 11.1*  HGB 8.1* 9.2*  HCT 24.5* 28.3*  MCV 85.1 86.3  PLT 156 158   Cardiac Enzymes:  Recent Labs Lab 02/21/2014 1220 03/01/2014 1500  CKTOTAL  --  40  CKMB  --  1.4  TROPONINI <0.30  --    BNP: No components found with this basename: POCBNP,  CBG:  Recent Labs Lab 03/02/2014 1906 02/22/14 1324 02/22/14 2209 02/23/14 0430 02/23/14 0752  GLUCAP 133* 186* 145* 160* 133*     Micro Results: Recent Results (from the past 240 hour(s))  MRSA PCR SCREENING     Status: Abnormal   Collection Time    03/06/2014  5:27 PM      Result Value Ref Range Status   MRSA by PCR POSITIVE (*) NEGATIVE Final   Comment:            The GeneXpert MRSA Assay (FDA     approved for NASAL specimens     only), is one component of a     comprehensive MRSA colonization     surveillance program. It is not     intended to diagnose MRSA     infection nor to guide or     monitor treatment for     MRSA infections.     RESULT CALLED TO, READ BACK BY AND VERIFIED WITH:     Linda Hedges RN [id number] 03/13/2014 A BROWNING  WOUND CULTURE     Status: None   Collection Time    03/18/2014  5:27 PM      Result Value Ref Range Status   Specimen Description WOUND   Final   Special Requests IMMUNOCOMPROMISED LEFT INGUINAL AREA   Final   Gram Stain     Final   Value: NO WBC SEEN     NO SQUAMOUS EPITHELIAL CELLS SEEN     RARE GRAM NEGATIVE RODS     Performed at [facility]   Culture     Final   Value: MULTIPLE ORGANISMS PRESENT, NONE PREDOMINANT     Note: NO STAPHYLOCOCCUS AUREUS ISOLATED NO GROUP A STREP (S.PYOGENES) ISOLATED     Performed at [facility]   Report Status 02/22/2014 FINAL   Final  CULTURE, BLOOD (ROUTINE X 2)     Status: None   Collection Time    02/21/14  5:31 PM      Result Value Ref Range Status   Specimen Description BLOOD LEFT HAND   Final   Special Requests BOTTLES DRAWN AEROBIC ONLY 8CC   Final   Culture  Setup  Time     Final   Value: 02/22/2014 02:22     Performed at [facility]   Culture     Final   Value:        BLOOD CULTURE RECEIVED NO GROWTH TO DATE CULTURE WILL BE HELD FOR 5 DAYS BEFORE ISSUING A FINAL NEGATIVE REPORT  Performed at [facility]   Report Status PENDING   Incomplete  CULTURE, BLOOD (ROUTINE X 2)     Status: None   Collection Time    02/21/14  5:40 PM      Result Value Ref Range Status   Specimen Description BLOOD LEFT HAND   Final   Special Requests BOTTLES DRAWN AEROBIC ONLY 10 CC   Final   Culture  Setup Time     Final   Value: 02/22/2014 02:23     Performed at [facility]   Culture     Final   Value:        BLOOD CULTURE RECEIVED NO GROWTH TO DATE CULTURE WILL BE HELD FOR 5 DAYS BEFORE ISSUING A FINAL NEGATIVE REPORT     Performed at [facility]   Report Status PENDING   Incomplete    Studies/Results: Dg Chest 1 View  02/03/2014   CLINICAL DATA:  Central catheter placement  EXAM: CHEST - 1 VIEW  COMPARISON:  Chest CT December 02, 2013  FINDINGS: Central catheter tip is in the right axillary vein region. There is again noted a 1.7 x 1.3 cm nodular opacity in the right midlung. There is no edema or consolidation. Heart is mildly enlarged with normal pulmonary vascularity. No apparent adenopathy. No pneumothorax. No bone lesions.  IMPRESSION: Central catheter tip in right axillary vein. Nodular opacity right mid lung. No edema or consolidation.   Electronically Signed   By: Lowella Grip M.D.   On: 02/03/2014 07:06   Dg Chest 2 View  02/16/2014   CLINICAL DATA:  2 week history shortness of breath ; the patient has a history of non-Hodgkin's lymphoma  EXAM: CHEST  2 VIEW  COMPARISON:  DG CHEST 1 VIEW dated 02/03/2014  FINDINGS: The lungs are adequately inflated. There is an abnormal soft tissue density nodule that projects in the lower 1/3 of the right hemithorax on the frontal view. It measures 1.4 x 1.9 cm in diameter. It may lie  anteriorly in the right middle lobe. This is similar in appearance to that demonstrated on the previous study. Elsewhere no definite pulmonary nodules are demonstrated. There is no alveolar infiltrate. The cardiac silhouette is normal in size. The pulmonary vascularity is not engorged. There is mild degenerative change of the thoracic spine with calcification of the anterior longitudinal ligament.  IMPRESSION: 1. There is persistent nodularity likely in the right middle lobe. There is no definite evidence of pneumonia. 2. There is no evidence of CHF. 3. Further evaluation of the thorax with chest CT scanning is recommended. This would allow further evaluation of the mediastinum and hilar regions as well as the pulmonary parenchyma given the patient's symptoms of shortness of breath.   Electronically Signed   By: David  Martinique   On: 02/16/2014 10:56   US Renal  2014/03/21   CLINICAL DATA:  Evaluate for hydronephrosis  EXAM: RENAL/URINARY TRACT ULTRASOUND COMPLETE  COMPARISON:  US RENAL dated 02/03/2014; [place] ART/VEN FLOW ABD PELV DOPPLER dated 11/27/2013; US SCROTUM dated 11/27/2013  FINDINGS: Right Kidney:  Length: 11.5 cm. Echogenicity within normal limits. No mass or hydronephrosis visualized.  Left Kidney:  Length: 12.2 cm. Echogenicity within normal limits. No mass or hydronephrosis visualized.  Bladder:  Urinary bladder is decompressed, patient is status post Foley catheter insertion.  IMPRESSION: Negative renal ultrasound.   Electronically Signed   By: Margaree Mackintosh M.D.   On: 21-Mar-2014 16:09   US Renal  02/03/2014  CLINICAL DATA:  Acute kidney injury.  EXAM: RENAL/URINARY TRACT ULTRASOUND COMPLETE  COMPARISON:  CT ABD/PELVIS W CM dated 10/30/2013  FINDINGS: Right Kidney:  Length: 11.6 cm. Echogenicity within normal limits. No mass or hydronephrosis visualized.  Left Kidney:  Length: 11.8 cm. Echogenicity within normal limits. No mass or hydronephrosis visualized.  Bladder:  Appears normal for degree of  bladder distention.  IMPRESSION: Unremarkable study.   Electronically Signed   By: Rolm Baptise M.D.   On: 02/03/2014 16:56   Dg Chest Port 1 View  02/23/2014   CLINICAL DATA:  Respiratory distress.  EXAM: PORTABLE CHEST - 1 VIEW  COMPARISON:  DG CHEST 1V PORT dated 02/20/2014; CT CHEST W/CM dated 12/02/2013; DG CHEST 1V PORT dated 03/02/2014; DG CHEST 1V PORT dated 03/17/2014  FINDINGS: Cardiomediastinal silhouette remains stable and normal. Dialysis catheter tips SVC RA junction, unchanged. No focal infiltrates, although right lung nodular densities persist. Negative osseous structures.  IMPRESSION: No active infiltrates.   Electronically Signed   By: Rolla Flatten M.D.   On: 02/23/2014 08:10   Dg Chest Port 1 View  02/20/2014   CLINICAL DATA:  Shortness of breath  EXAM: PORTABLE CHEST - 1 VIEW  COMPARISON:  03/18/2014  FINDINGS: A temporary dialysis catheter is again noted on the right. The cardiac shadow is stable. The lungs are well aerated bilaterally without focal infiltrate or sizable effusion.  IMPRESSION: No acute abnormality noted.   Electronically Signed   By: Inez Catalina M.D.   On: 02/20/2014 08:08   Dg Chest Port 1 View  02/28/2014   CLINICAL DATA:  Status post central line placement  EXAM: PORTABLE CHEST - 1 VIEW  COMPARISON:  02/24/2014 1251 hrs  FINDINGS: The cardiac shadow is stable. A temporary dialysis catheter is now seen in the distal superior vena cava. No pneumothorax is noted. Mild atelectatic changes are noted in the right base. This may be related to the poor inspiratory effort.  IMPRESSION: Status post temporary dialysis catheter placement without pneumothorax.   Electronically Signed   By: Inez Catalina M.D.   On: 02/25/2014 19:31   Dg Chest Portable 1 View  02/26/2014   CLINICAL DATA:  Shortness of breath. Non-Hodgkin's lymphoma. Diabetes and hypertension.  EXAM: PORTABLE CHEST - 1 VIEW  COMPARISON:  DG CHEST 2 VIEW dated 02/16/2014; DG CHEST 1 VIEW dated 02/03/2014; CT CHEST W/CM dated  12/02/2013  FINDINGS: The heart is mildly enlarged. Right middle lobe nodule appears larger, now measuring 2.7 cm. Probable right upper lobe nodule is only partially seen, measuring approximately 1 cm in diameter radiographically. Lesion was larger based on prior CT evaluation. There is slight prominence the left hilar region but this is felt to be stable compared with prior radiographs and CT. No new consolidation or pleural effusion.  IMPRESSION: Persistent right lung nodules. Right middle lobe nodule is larger ring compared prior studies.   Electronically Signed   By: Shon Hale M.D.   On: 02/17/2014 13:08    Medications: Scheduled Meds: . Chlorhexidine Gluconate Cloth  6 each Topical Q0600  . heparin subcutaneous  5,000 Units Subcutaneous 3 times per day  . hydrocortisone sod succinate (SOLU-CORTEF) inj  25 mg Intravenous Q12H  . insulin aspart  0-9 Units Subcutaneous 6 times per day  . mupirocin ointment  1 application Nasal BID  . pantoprazole  40 mg Oral Q1200  . sodium chloride  3 mL Intravenous Q12H      LOS: 4 days   RAI,RIPUDEEP M.D.  [facility] 02/23/2014, 11:48 AM Pager: [phone]  If 7PM-7AM, please contact night-coverage [email or web address] Password [email or web address]

## 2014-02-23 NOTE — Progress Notes (Signed)
Pt stated that he will self administer the CPAP when ready for bed,  RT to monitor and assess as needed.

## 2014-02-23 NOTE — Progress Notes (Signed)
Subjective: Interval History: has complaints [name] leg has been all the problem.  Objective: Vital signs in last 24 hours: Temp:  [97.8 F (36.6 C)-98.9 F (37.2 C)] 98.6 F (37 C) (04/07 0942) Pulse Rate:  [88-106] 96 (04/07 0942) Resp:  [13-33] 18 (04/07 0942) BP: (97-133)/(52-77) 101/52 mmHg (04/07 0942) SpO2:  [97 %-100 %] 100 % (04/07 0942) Weight change:   Intake/Output from previous day: 04/06 0701 - 04/07 0700 In: 600 [P.O.:600] Out: 2105 [Urine:30; Drains:675] Intake/Output this shift: Total I/O In: 240 [P.O.:240] Out: -   General appearance: alert, cooperative and morbidly obese Resp: diminished breath sounds bilaterally Cardio: S1, S2 normal and systolic murmur: holosystolic 2/6, blowing at apex GI: obese, pos bs, striae, liver down 7 cm Extremities: Vac [name] groin, large amount swelling, [name] leg swollen  Lab Results:  Recent Labs  02/22/14 0500 02/23/14 0647  WBC 14.3* 13.1*  HGB 8.1* 9.2*  HCT 24.5* 28.3*  PLT 156 158   BMET:  Recent Labs  02/22/14 1600 02/23/14 0647  NA 131* 135*  K 4.3 3.9  CL 87* 92*  CO2 19 21  GLUCOSE 172* 134*  BUN 67* 38*  CREATININE 4.92* 3.63*  CALCIUM 9.1 9.2   No results found for this basename: PTH,  in the last 72 hours Iron Studies: No results found for this basename: IRON, TIBC, TRANSFERRIN, FERRITIN,  in the last 72 hours  Studies/Results: Dg Chest Port 1 View  02/23/2014   CLINICAL DATA:  Respiratory distress.  EXAM: PORTABLE CHEST - 1 VIEW  COMPARISON:  DG CHEST 1V PORT dated 02/20/2014; CT CHEST W/CM dated 12/02/2013; DG CHEST 1V PORT dated 03/16/2014; DG CHEST 1V PORT dated 03/06/2014  FINDINGS: Cardiomediastinal silhouette remains stable and normal. Dialysis catheter tips SVC RA junction, unchanged. No focal infiltrates, although right lung nodular densities persist. Negative osseous structures.  IMPRESSION: No active infiltrates.   Electronically Signed   By: Rolla Flatten M.D.   On: 02/23/2014 08:10    I have reviewed  the patient's current medications.  Assessment/Plan: 1 AKI  presumeably groin sepsis.  Tol HD well. Not making urine , will do HD in am. Follow. Will need PC soon.   2 Anemia stable 3 Groin wound on AB and local care via surgery 4 massive obesity 5 NHL per onc 6 DM controlled. 7 Lower GIB resolved P HD, follow urine, Hb , Cont local care.    LOS: 4 days   Douglas [name],Douglas [name] 02/23/2014,12:17 PM

## 2014-02-23 NOTE — Progress Notes (Signed)
Patient ID: Douglas [name], male   DOB: Sep 06, 1956, 58 y.o.   MRN: [id number]  Subjective: No complaints.  VAC placed yesterday.    Objective:  Vital signs:  Filed Vitals:   02/22/14 2041 02/22/14 2204 02/23/14 0431 02/23/14 0942  BP: 107/71 109/61 112/57 101/52  Pulse: 99 105 98 96  Temp: 97.8 F (36.6 C) 98.5 F (36.9 C) 98.9 F (37.2 C) 98.6 F (37 C)  TempSrc: Oral Oral Oral Oral  Resp: _0 Height:      Weight:      SpO2: 100% 100% 100% 100%    Last BM Date: 02/21/14  Intake/Output   Yesterday:  04/06 0701 - 04/07 0700 In: 600 [P.O.:600] Out: 2105 [Urine:30; Drains:675] This shift:    I/O last 3 completed shifts: In: 2333 [P.O.:1330; I.V.:3; Other:500; IV Piggyback:500] Out: 2105 [Urine:30; Drains:675; Other:1400]    Physical Exam: General: Pt awake/alert/oriented x4 in no acute distress Skin: area surrounding the vac is intact.  VAC in place with serous drainage.  636m recorded.     Problem List:   Principal Problem:   Acute kidney injury-potentially related to tumor lysis Active Problems:   NHL (non-Hodgkin's lymphoma)   HTN (hypertension)   Dyslipidemia   Anemia of chronic disease   Morbid obesity   Lower GI bleed   Diabetes mellitus   Hyperkalemia   Tumor lysis syndrome, possibility of   Uremia   SOB (shortness of breath) DDX = CHF vs. PE   Acute renal failure   Acidosis    Results:   Labs: Results for orders placed during the hospital encounter of 02/25/2014 (from the past 48 hour(s))  RENAL FUNCTION PANEL     Status: Abnormal   Collection Time    02/21/14  5:29 PM      Result Value Ref Range   Sodium 131 (*) 137 - 147 mEq/L   Potassium 5.8 (*) 3.7 - 5.3 mEq/L   Comment: HEMOLYSIS AT THIS LEVEL MAY AFFECT RESULT   Chloride 88 (*) 96 - 112 mEq/L   CO2 20  19 - 32 mEq/L   Glucose, Bld 197 (*) 70 - 99 mg/dL   BUN 56 (*) 6 - 23 mg/dL   Creatinine, Ser 3.56 (*) 0.50 - 1.35 mg/dL   Calcium 8.5  8.4 - 10.5 mg/dL    Phosphorus 4.1  2.3 - 4.6 mg/dL   Albumin 2.4 (*) 3.5 - 5.2 g/dL   GFR calc non Af Amer 18 (*) >90 mL/min   GFR calc Af Amer 21 (*) >90 mL/min   Comment: (NOTE)     The eGFR has been calculated using the CKD EPI equation.     This calculation has not been validated in all clinical situations.     eGFR's persistently <90 mL/min signify possible Chronic Kidney     Disease.  LACTIC ACID, PLASMA     Status: Abnormal   Collection Time    02/21/14  5:29 PM      Result Value Ref Range   Lactic Acid, Venous 3.1 (*) 0.5 - 2.2 mmol/L  PROCALCITONIN     Status: None   Collection Time    02/21/14  5:29 PM      Result Value Ref Range   Procalcitonin 2.19     Comment:            Interpretation:     PCT > 2 ng/mL:     Systemic infection (sepsis) is likely,  unless other causes are known.     (NOTE)             ICU PCT Algorithm               Non ICU PCT Algorithm        ----------------------------     ------------------------------             PCT < 0.25 ng/mL                 PCT < 0.1 ng/mL         Stopping of antibiotics            Stopping of antibiotics           strongly encouraged.               strongly encouraged.        ----------------------------     ------------------------------           PCT level decrease by               PCT < 0.25 ng/mL           >= 80% from peak PCT           OR PCT 0.25 - 0.5 ng/mL          Stopping of antibiotics                                                 encouraged.         Stopping of antibiotics               encouraged.        ----------------------------     ------------------------------           PCT level decrease by              PCT >= 0.25 ng/mL           < 80% from peak PCT            AND PCT >= 0.5 ng/mL            Continuing antibiotics                                                  encouraged.           Continuing antibiotics                encouraged.        ----------------------------     ------------------------------          PCT level increase compared          PCT > 0.5 ng/mL             with peak PCT AND              PCT >= 0.5 ng/mL             Escalation of antibiotics  strongly encouraged.          Escalation of antibiotics            strongly encouraged.  CULTURE, BLOOD (ROUTINE X 2)     Status: None   Collection Time    02/21/14  5:31 PM      Result Value Ref Range   Specimen Description BLOOD LEFT HAND     Special Requests BOTTLES DRAWN AEROBIC ONLY 8CC     Culture  Setup Time       Value: 02/22/2014 02:22     Performed at [facility]   Culture       Value:        BLOOD CULTURE RECEIVED NO GROWTH TO DATE CULTURE WILL BE HELD FOR 5 DAYS BEFORE ISSUING A FINAL NEGATIVE REPORT     Performed at [facility]   Report Status PENDING    CULTURE, BLOOD (ROUTINE X 2)     Status: None   Collection Time    02/21/14  5:40 PM      Result Value Ref Range   Specimen Description BLOOD LEFT HAND     Special Requests BOTTLES DRAWN AEROBIC ONLY 10 CC     Culture  Setup Time       Value: 02/22/2014 02:23     Performed at [facility]   Culture       Value:        BLOOD CULTURE RECEIVED NO GROWTH TO DATE CULTURE WILL BE HELD FOR 5 DAYS BEFORE ISSUING A FINAL NEGATIVE REPORT     Performed at [facility]   Report Status PENDING    BASIC METABOLIC PANEL     Status: Abnormal   Collection Time    02/21/14 10:03 PM      Result Value Ref Range   Sodium 128 (*) 137 - 147 mEq/L   Potassium 4.3  3.7 - 5.3 mEq/L   Comment: DELTA CHECK NOTED   Chloride 87 (*) 96 - 112 mEq/L   CO2 21  19 - 32 mEq/L   Glucose, Bld 275 (*) 70 - 99 mg/dL   BUN 55 (*) 6 - 23 mg/dL   Creatinine, Ser 3.98 (*) 0.50 - 1.35 mg/dL   Calcium 8.3 (*) 8.4 - 10.5 mg/dL   GFR calc non Af Amer 15 (*) >90 mL/min   GFR calc Af Amer 18 (*) >90 mL/min   Comment: (NOTE)     The eGFR has been calculated using the CKD EPI equation.     This calculation has not been  validated in all clinical situations.     eGFR's persistently <90 mL/min signify possible Chronic Kidney     Disease.  MAGNESIUM     Status: None   Collection Time    02/22/14  5:00 AM      Result Value Ref Range   Magnesium 2.5  1.5 - 2.5 mg/dL  COMPREHENSIVE METABOLIC PANEL     Status: Abnormal   Collection Time    02/22/14  5:00 AM      Result Value Ref Range   Sodium 130 (*) 137 - 147 mEq/L   Potassium 4.3  3.7 - 5.3 mEq/L   Chloride 88 (*) 96 - 112 mEq/L   CO2 20  19 - 32 mEq/L   Glucose, Bld 203 (*) 70 - 99 mg/dL   BUN 62 (*) 6 - 23 mg/dL   Creatinine, Ser 4.48 (*) 0.50 - 1.35 mg/dL  Calcium 8.7  8.4 - 10.5 mg/dL   Total Protein 6.5  6.0 - 8.3 g/dL   Albumin 2.3 (*) 3.5 - 5.2 g/dL   AST 26  0 - 37 U/L   ALT 9  0 - 53 U/L   Alkaline Phosphatase 78  39 - 117 U/L   Total Bilirubin 0.3  0.3 - 1.2 mg/dL   GFR calc non Af Amer 13 (*) >90 mL/min   GFR calc Af Amer 16 (*) >90 mL/min   Comment: (NOTE)     The eGFR has been calculated using the CKD EPI equation.     This calculation has not been validated in all clinical situations.     eGFR's persistently <90 mL/min signify possible Chronic Kidney     Disease.  CBC WITH DIFFERENTIAL     Status: Abnormal   Collection Time    02/22/14  5:00 AM      Result Value Ref Range   WBC 14.3 (*) 4.0 - 10.5 K/uL   RBC 2.88 (*) 4.22 - 5.81 MIL/uL   Hemoglobin 8.1 (*) 13.0 - 17.0 g/dL   HCT 24.5 (*) 39.0 - 52.0 %   MCV 85.1  78.0 - 100.0 fL   MCH 28.1  26.0 - 34.0 pg   MCHC 33.1  30.0 - 36.0 g/dL   RDW 17.1 (*) 11.5 - 15.5 %   Platelets 156  150 - 400 K/uL   Neutrophils Relative % 89 (*) 43 - 77 %   Neutro Abs 12.6 (*) 1.7 - 7.7 K/uL   Lymphocytes Relative 7 (*) 12 - 46 %   Lymphs Abs 1.0  0.7 - 4.0 K/uL   Monocytes Relative 4  3 - 12 %   Monocytes Absolute 0.6  0.1 - 1.0 K/uL   Eosinophils Relative 0  0 - 5 %   Eosinophils Absolute 0.0  0.0 - 0.7 K/uL   Basophils Relative 0  0 - 1 %   Basophils Absolute 0.0  0.0 - 0.1 K/uL   LACTATE DEHYDROGENASE     Status: Abnormal   Collection Time    02/22/14  5:00 AM      Result Value Ref Range   LDH 434 (*) 94 - 250 U/L  URIC ACID     Status: None   Collection Time    02/22/14  5:00 AM      Result Value Ref Range   Uric Acid, Serum 5.2  4.0 - 7.8 mg/dL  LACTIC ACID, PLASMA     Status: Abnormal   Collection Time    02/22/14  5:00 AM      Result Value Ref Range   Lactic Acid, Venous 4.3 (*) 0.5 - 2.2 mmol/L  PROCALCITONIN     Status: None   Collection Time    02/22/14  5:00 AM      Result Value Ref Range   Procalcitonin 2.42     Comment:            Interpretation:     PCT > 2 ng/mL:     Systemic infection (sepsis) is likely,     unless other causes are known.     (NOTE)             ICU PCT Algorithm               Non ICU PCT Algorithm        ----------------------------     ------------------------------  PCT < 0.25 ng/mL                 PCT < 0.1 ng/mL         Stopping of antibiotics            Stopping of antibiotics           strongly encouraged.               strongly encouraged.        ----------------------------     ------------------------------           PCT level decrease by               PCT < 0.25 ng/mL           >= 80% from peak PCT           OR PCT 0.25 - 0.5 ng/mL          Stopping of antibiotics                                                 encouraged.         Stopping of antibiotics               encouraged.        ----------------------------     ------------------------------           PCT level decrease by              PCT >= 0.25 ng/mL           < 80% from peak PCT            AND PCT >= 0.5 ng/mL            Continuing antibiotics                                                  encouraged.           Continuing antibiotics                encouraged.        ----------------------------     ------------------------------         PCT level increase compared          PCT > 0.5 ng/mL             with peak PCT AND               PCT >= 0.5 ng/mL             Escalation of antibiotics                                              strongly encouraged.          Escalation of antibiotics            strongly encouraged.  HEPATITIS B SURFACE ANTIGEN     Status: None   Collection Time    02/22/14  7:27 AM      Result Value Ref Range   Hepatitis B  Surface Ag NEGATIVE  NEGATIVE   Comment: Performed at [place], [facility]     Status: None   Collection Time    02/22/14  7:27 AM      Result Value Ref Range   Hep B C IgM NON REACTIVE  NON REACTIVE   Comment: (NOTE)     High levels of Hepatitis B Core IgM antibody are detectable     during the acute stage of Hepatitis B. This antibody is used     to differentiate current from past HBV infection.     Performed at [facility] ANTIBODY     Status: None   Collection Time    02/22/14  7:27 AM      Result Value Ref Range   Hep B S Ab NEGATIVE  NEGATIVE   Comment: Performed at [facility], [facility]OM     Status: None   Collection Time    02/22/14 12:18 PM      Result Value Ref Range   Vancomycin Rm 34.0     Comment:            Random Vancomycin therapeutic     range is dependent on dosage and     time of specimen collection.     A peak range is 20.0-40.0 ug/mL     A trough range is 5.0-15.0 ug/mL             GLUCOSE, CAPILLARY     Status: Abnormal   Collection Time    02/22/14  1:24 PM      Result Value Ref Range   Glucose-Capillary 186 (*) 70 - 99 mg/dL  RENAL FUNCTION PANEL     Status: Abnormal   Collection Time    02/22/14  4:00 PM      Result Value Ref Range   Sodium 131 (*) 137 - 147 mEq/L   Potassium 4.3  3.7 - 5.3 mEq/L   Chloride 87 (*) 96 - 112 mEq/L   CO2 19  19 - 32 mEq/L   Glucose, Bld 172 (*) 70 - 99 mg/dL   BUN 67 (*) 6 - 23 mg/dL   Creatinine, Ser 4.92 (*) 0.50 - 1.35 mg/dL   Calcium 9.1  8.4 - 10.5 mg/dL   Phosphorus 4.0  2.3 - 4.6 mg/dL   Albumin 2.4 (*) 3.5 -  5.2 g/dL   GFR calc non Af Amer 12 (*) >90 mL/min   GFR calc Af Amer 14 (*) >90 mL/min   Comment: (NOTE)     The eGFR has been calculated using the CKD EPI equation.     This calculation has not been validated in all clinical situations.     eGFR's persistently <90 mL/min signify possible Chronic Kidney     Disease.  HEPATITIS B CORE ANTIBODY, TOTAL     Status: None   Collection Time    02/22/14  5:16 PM      Result Value Ref Range   Hep B Core Total Ab NON REACTIVE  NON REACTIVE   Comment: Performed at [place], CAPILLARY     Status: Abnormal   Collection Time    02/22/14 10:09 PM      Result Value Ref Range   Glucose-Capillary 145 (*) 70 - 99 mg/dL  GLUCOSE, CAPILLARY     Status: Abnormal   Collection Time    02/23/14  4:30 AM  Result Value Ref Range   Glucose-Capillary 160 (*) 70 - 99 mg/dL  COMPREHENSIVE METABOLIC PANEL     Status: Abnormal   Collection Time    02/23/14  6:47 AM      Result Value Ref Range   Sodium 135 (*) 137 - 147 mEq/L   Potassium 3.9  3.7 - 5.3 mEq/L   Chloride 92 (*) 96 - 112 mEq/L   CO2 21  19 - 32 mEq/L   Glucose, Bld 134 (*) 70 - 99 mg/dL   BUN 38 (*) 6 - 23 mg/dL   Creatinine, Ser 3.63 (*) 0.50 - 1.35 mg/dL   Calcium 9.2  8.4 - 10.5 mg/dL   Total Protein 7.1  6.0 - 8.3 g/dL   Albumin 2.5 (*) 3.5 - 5.2 g/dL   AST 36  0 - 37 U/L   ALT 12  0 - 53 U/L   Alkaline Phosphatase 87  39 - 117 U/L   Total Bilirubin 0.3  0.3 - 1.2 mg/dL   GFR calc non Af Amer 17 (*) >90 mL/min   GFR calc Af Amer 20 (*) >90 mL/min   Comment: (NOTE)     The eGFR has been calculated using the CKD EPI equation.     This calculation has not been validated in all clinical situations.     eGFR's persistently <90 mL/min signify possible Chronic Kidney     Disease.  CBC WITH DIFFERENTIAL     Status: Abnormal   Collection Time    02/23/14  6:47 AM      Result Value Ref Range   WBC 13.1 (*) 4.0 - 10.5 K/uL   RBC 3.28 (*) 4.22 - 5.81 MIL/uL    Hemoglobin 9.2 (*) 13.0 - 17.0 g/dL   HCT 28.3 (*) 39.0 - 52.0 %   MCV 86.3  78.0 - 100.0 fL   MCH 28.0  26.0 - 34.0 pg   MCHC 32.5  30.0 - 36.0 g/dL   RDW 17.1 (*) 11.5 - 15.5 %   Platelets 158  150 - 400 K/uL   Neutrophils Relative % 85 (*) 43 - 77 %   Neutro Abs 11.1 (*) 1.7 - 7.7 K/uL   Lymphocytes Relative 10 (*) 12 - 46 %   Lymphs Abs 1.3  0.7 - 4.0 K/uL   Monocytes Relative 5  3 - 12 %   Monocytes Absolute 0.6  0.1 - 1.0 K/uL   Eosinophils Relative 0  0 - 5 %   Eosinophils Absolute 0.0  0.0 - 0.7 K/uL   Basophils Relative 0  0 - 1 %   Basophils Absolute 0.0  0.0 - 0.1 K/uL  LACTATE DEHYDROGENASE     Status: Abnormal   Collection Time    02/23/14  6:47 AM      Result Value Ref Range   LDH 536 (*) 94 - 250 U/L  URIC ACID     Status: Abnormal   Collection Time    02/23/14  6:47 AM      Result Value Ref Range   Uric Acid, Serum 3.8 (*) 4.0 - 7.8 mg/dL  PHOSPHORUS     Status: None   Collection Time    02/23/14  6:47 AM      Result Value Ref Range   Phosphorus 3.0  2.3 - 4.6 mg/dL  LACTIC ACID, PLASMA     Status: Abnormal   Collection Time    02/23/14  6:47 AM      Result Value  Ref Range   Lactic Acid, Venous 4.1 (*) 0.5 - 2.2 mmol/L  GLUCOSE, CAPILLARY     Status: Abnormal   Collection Time    02/23/14  7:52 AM      Result Value Ref Range   Glucose-Capillary 133 (*) 70 - 99 mg/dL    Imaging / Studies: Dg Chest Port 1 View  02/23/2014   CLINICAL DATA:  Respiratory distress.  EXAM: PORTABLE CHEST - 1 VIEW  COMPARISON:  DG CHEST 1V PORT dated 02/20/2014; CT CHEST W/CM dated 12/02/2013; DG CHEST 1V PORT dated 03/16/2014; DG CHEST 1V PORT dated 03/18/2014  FINDINGS: Cardiomediastinal silhouette remains stable and normal. Dialysis catheter tips SVC RA junction, unchanged. No focal infiltrates, although right lung nodular densities persist. Negative osseous structures.  IMPRESSION: No active infiltrates.   Electronically Signed   By: Rolla Flatten M.D.   On: 02/23/2014 08:10     Medications / Allergies: per chart  Antibiotics: Anti-infectives   Start     Dose/Rate Route Frequency Ordered Stop   02/20/14 2200  vancomycin (VANCOCIN) 1,500 mg in sodium chloride 0.9 % 500 mL IVPB  Status:  Discontinued     1,500 mg 250 mL/hr over 120 Minutes Intravenous Every 24 hours 03/10/2014 2119 02/22/14 1116   02/23/2014 2200  vancomycin (VANCOCIN) 2,000 mg in sodium chloride 0.9 % 500 mL IVPB     2,000 mg 250 mL/hr over 120 Minutes Intravenous  Once 02/26/2014 2119 02/20/14 0005        Patient Active Problem List   Diagnosis Date Noted  . Tumor lysis syndrome, possibility of 03/04/2014  . Acute kidney injury-potentially related to tumor lysis 03/01/2014  . Uremia 03/07/2014  . SOB (shortness of breath) DDX = CHF vs. PE 02/23/2014  . Acute renal failure 02/28/2014  . Acidosis 02/28/2014  . AKI (acute kidney injury) 02/02/2014  . Hyperkalemia 02/02/2014  . Cellulitis and abscess of left groin 01/19/2014  . Lower GI bleed 12/20/2013  . Acute blood loss anemia 12/20/2013  . Diabetes mellitus 12/20/2013  . NHL (non-Hodgkin's lymphoma) 10/31/2013  . Mass of left thigh 10/31/2013  . Cellulitis 10/31/2013  . HTN (hypertension) 10/31/2013  . Dyslipidemia 10/31/2013  . Anemia of chronic disease 10/31/2013  . Morbid obesity 10/31/2013  . Non Hodgkin's lymphoma 10/31/2013    Assessment/Plan  1. Open biopsy site; left groin with Non Hodgkin's lymphoma. Ongoing primarily clear drainage. 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 Culture 4/3 showed multiple organisms, non predominant   Not much to do surgically VAC change MWF Can follow up in wound center Surgery signing off, please do not hesitate to call [facility] with questions or concerns.     Erby Pian, [facility] Surgery Pager [phone] Office [phone]  02/23/2014 10:23 AM

## 2014-02-23 NOTE — Progress Notes (Signed)
ANTIBIOTIC CONSULT NOTE   Pharmacy Consult for Vancomycin Indication: Draining wound  No Known Allergies  Patient Measurements: Height: 5' 10.08" (178 cm) Weight: 467 lb (211.83 kg) IBW/kg (Calculated) : 73.18  Vital Signs: Temp: 98.2 F (36.8 C) (04/07 1344) Temp src: Oral (04/07 1344) BP: 100/54 mmHg (04/07 1344) Pulse Rate: 88 (04/07 1344) Intake/Output from previous day: 04/06 0701 - 04/07 0700 In: 600 [P.O.:600] Out: 2105 [Urine:30; Drains:675] Intake/Output from this shift: Total I/O In: 480 [P.O.:480] Out: -   Labs:  Recent Labs  02/21/14 0400  02/22/14 0500 02/22/14 1600 02/23/14 0647  WBC 13.4*  --  14.3*  --  13.1*  HGB 8.9*  --  8.1*  --  9.2*  PLT 181  --  156  --  158  CREATININE 4.26*  < > 4.48* 4.92* 3.63*  < > = values in this interval not displayed. Estimated Creatinine Clearance: 41.3 ml/min (by C-G formula based on Cr of 3.63).  Recent Labs  02/22/14 1218 02/23/14 1100  VANCORANDOM 34.0 23.5     Microbiology: Recent Results (from the past 720 hour(s))  CULTURE, ROUTINE-ABSCESS     Status: None   Collection Time    02/03/14 12:11 PM      Result Value Ref Range Status   Specimen Description ABSCESS LEFT GROIN   Final   Special Requests NONE   Final   Gram Stain     Final   Value: ABUNDANT WBC PRESENT,BOTH PMN AND MONONUCLEAR     NO SQUAMOUS EPITHELIAL CELLS SEEN     ABUNDANT GRAM POSITIVE COCCI IN PAIRS     MODERATE GRAM NEGATIVE RODS     Performed at [facility]   Culture     Final   Value: MODERATE ESCHERICHIA COLI     MODERATE METHICILLIN RESISTANT STAPHYLOCOCCUS AUREUS     Note: RIFAMPIN AND GENTAMICIN SHOULD NOT BE USED AS SINGLE DRUGS FOR TREATMENT OF STAPH INFECTIONS.     Performed at [facility]   Report Status 02/07/2014 FINAL   Final   Organism ID, Bacteria ESCHERICHIA COLI   Final   Organism ID, Bacteria METHICILLIN RESISTANT STAPHYLOCOCCUS AUREUS   Final  MRSA PCR SCREENING     Status: Abnormal   Collection Time    17-Mar-2014  5:27 PM      Result Value Ref Range Status   MRSA by PCR POSITIVE (*) NEGATIVE Final   Comment:            The GeneXpert MRSA Assay (FDA     approved for NASAL specimens     only), is one component of a     comprehensive MRSA colonization     surveillance program. It is not     intended to diagnose MRSA     infection nor to guide or     monitor treatment for     MRSA infections.     RESULT CALLED TO, READ BACK BY AND VERIFIED WITH:     Linda Hedges RN 2154 03/17/14 A BROWNING  WOUND CULTURE     Status: None   Collection Time    03/17/14  5:27 PM      Result Value Ref Range Status   Specimen Description WOUND   Final   Special Requests IMMUNOCOMPROMISED LEFT INGUINAL AREA   Final   Gram Stain     Final   Value: NO WBC SEEN     NO SQUAMOUS EPITHELIAL CELLS SEEN     RARE Lonell Grandchild  NEGATIVE RODS     Performed at [facility]   Culture     Final   Value: MULTIPLE ORGANISMS PRESENT, NONE PREDOMINANT     Note: NO STAPHYLOCOCCUS AUREUS ISOLATED NO GROUP A STREP (S.PYOGENES) ISOLATED     Performed at [facility]   Report Status 02/22/2014 FINAL   Final  CULTURE, BLOOD (ROUTINE X 2)     Status: None   Collection Time    02/21/14  5:31 PM      Result Value Ref Range Status   Specimen Description BLOOD LEFT HAND   Final   Special Requests BOTTLES DRAWN AEROBIC ONLY 8CC   Final   Culture  Setup Time     Final   Value: 02/22/2014 02:22     Performed at [facility]   Culture     Final   Value:        BLOOD CULTURE RECEIVED NO GROWTH TO DATE CULTURE WILL BE HELD FOR 5 DAYS BEFORE ISSUING A FINAL NEGATIVE REPORT     Performed at [facility]   Report Status PENDING   Incomplete  CULTURE, BLOOD (ROUTINE X 2)     Status: None   Collection Time    02/21/14  5:40 PM      Result Value Ref Range Status   Specimen Description BLOOD LEFT HAND   Final   Special Requests BOTTLES DRAWN AEROBIC ONLY 10 CC   Final   Culture  Setup Time      Final   Value: 02/22/2014 02:23     Performed at [facility]   Culture     Final   Value:        BLOOD CULTURE RECEIVED NO GROWTH TO DATE CULTURE WILL BE HELD FOR 5 DAYS BEFORE ISSUING A FINAL NEGATIVE REPORT     Performed at [facility]   Report Status PENDING   Incomplete    Medical History: Past Medical History  Diagnosis Date  . Non Hodgkin's lymphoma January 2009  . Diabetes mellitus without complication   . Hypertension   . Sleep apnea   . Lymphadenopathy, inguinal     left    Medications:  Prescriptions prior to admission  Medication Sig Dispense Refill  . Alogliptin-Pioglitazone (OSENI) 25-15 MG TABS Take 1 tablet by mouth daily.      [name] docusate sodium (COLACE) 100 MG capsule Take 100 mg by mouth 2 (two) times daily.      . Ferrous Sulfate (IRON) 325 (65 FE) MG TABS Take 325 mg by mouth 2 (two) times daily.  60 each  0  . Hydrocortisone-Aloe Vera (CORTIZONE-10 INTENSIVE HEALING) 1 % CREA Apply 1 application topically as needed (For itching).      [name] omeprazole (PRILOSEC) 40 MG capsule Take 1 capsule (40 mg total) by mouth 2 (two) times daily.  60 capsule  0  . oxyCODONE-acetaminophen (PERCOCET) 10-325 MG per tablet Take 1 tablet by mouth every 6 (six) hours as needed for pain.  60 tablet  0  . polyethylene glycol (MIRALAX / GLYCOLAX) packet Take 17 g by mouth daily as needed for mild constipation.       . prochlorperazine (COMPAZINE) 10 MG tablet Take 1 tablet (10 mg total) by mouth every 6 (six) hours as needed for nausea or vomiting.  60 tablet  0   Scheduled:  . Chlorhexidine Gluconate Cloth  6 each Topical Q0600  . heparin subcutaneous  5,000 Units Subcutaneous 3 times  per day  . hydrocortisone sod succinate (SOLU-CORTEF) inj  25 mg Intravenous Q12H  . insulin aspart  0-9 Units Subcutaneous 6 times per day  . multivitamin  1 tablet Oral QHS  . mupirocin ointment  1 application Nasal BID  . pantoprazole  40 mg Oral Q1200  . sodium chloride  3 mL  Intravenous Q12H  . [START ON 02/24/2014] vancomycin  1,500 mg Intravenous Once     Assessment: 58 yo M who was tx from [facility] on 4/3 with SOB, hyperkalemia and ARF requiring emergent CRRT. Vancomycin was started for left groin draining wound on 4/3. CRRT discontinued on 4/5, for iHD . Currently afebrile, WBC 13.1, Random vanc level today 23.5 mcg/mL.  4/3 Wound >> mult, none predominant 4/3 MRSA PCR + 4/5 BCx2>>  Goal of Therapy:  Pre-HD level 15-25 mcg/mL Post-HD level 5-15 mcg/mL  Plan:  - Vanc 1500 mg Iv X 1 tomorrow after HD - Will re-dose based on tolerance of HD - Continue to monitor temp, WBC, C&S, clinical improvement  Excell Seltzer, PharmD Clinical Pharmacist  Pager: [phone] Pharmacy: [phone] 02/23/2014 2:23 PM

## 2014-02-23 NOTE — Progress Notes (Signed)
Just needs VAC.  Contact us if further advice necessary.  Kathryne Eriksson. Dahlia Bailiff, MD, [place] [phone] [facility] [place] [facility]

## 2014-02-24 ENCOUNTER — Inpatient Hospital Stay (HOSPITAL_COMMUNITY): Payer: BC Managed Care – PPO

## 2014-02-24 LAB — CBC WITH DIFFERENTIAL/PLATELET
BASOS PCT: 0 % (ref 0–1)
Basophils Absolute: 0 10*3/uL (ref 0.0–0.1)
Eosinophils Absolute: 0 10*3/uL (ref 0.0–0.7)
Eosinophils Relative: 0 % (ref 0–5)
HEMATOCRIT: 28.8 % — AB (ref 39.0–52.0)
HEMOGLOBIN: 9.2 g/dL — AB (ref 13.0–17.0)
Lymphocytes Relative: 9 % — ABNORMAL LOW (ref 12–46)
Lymphs Abs: 1.4 10*3/uL (ref 0.7–4.0)
MCH: 27.7 pg (ref 26.0–34.0)
MCHC: 31.9 g/dL (ref 30.0–36.0)
MCV: 86.7 fL (ref 78.0–100.0)
MONO ABS: 0.6 10*3/uL (ref 0.1–1.0)
MONOS PCT: 4 % (ref 3–12)
NEUTROS PCT: 87 % — AB (ref 43–77)
Neutro Abs: 13 10*3/uL — ABNORMAL HIGH (ref 1.7–7.7)
Platelets: 157 10*3/uL (ref 150–400)
RBC: 3.32 MIL/uL — AB (ref 4.22–5.81)
RDW: 17.5 % — ABNORMAL HIGH (ref 11.5–15.5)
WBC: 15.1 10*3/uL — ABNORMAL HIGH (ref 4.0–10.5)

## 2014-02-24 LAB — COMPREHENSIVE METABOLIC PANEL
ALK PHOS: 85 U/L (ref 39–117)
ALT: 11 U/L (ref 0–53)
AST: 33 U/L (ref 0–37)
Albumin: 2.4 g/dL — ABNORMAL LOW (ref 3.5–5.2)
BUN: 51 mg/dL — AB (ref 6–23)
CHLORIDE: 90 meq/L — AB (ref 96–112)
CO2: 21 meq/L (ref 19–32)
Calcium: 9.4 mg/dL (ref 8.4–10.5)
Creatinine, Ser: 5 mg/dL — ABNORMAL HIGH (ref 0.50–1.35)
GFR, EST AFRICAN AMERICAN: 14 mL/min — AB (ref >90)
GFR, EST NON AFRICAN AMERICAN: 12 mL/min — AB (ref >90)
GLUCOSE: 125 mg/dL — AB (ref 70–99)
POTASSIUM: 4.4 meq/L (ref 3.7–5.3)
Sodium: 132 mEq/L — ABNORMAL LOW (ref 137–147)
TOTAL PROTEIN: 6.8 g/dL (ref 6.0–8.3)
Total Bilirubin: 0.3 mg/dL (ref 0.3–1.2)

## 2014-02-24 LAB — GLUCOSE, CAPILLARY
GLUCOSE-CAPILLARY: 133 mg/dL — AB (ref 70–99)
GLUCOSE-CAPILLARY: 125 mg/dL — AB (ref 70–99)
Glucose-Capillary: 182 mg/dL — ABNORMAL HIGH (ref 70–99)
Glucose-Capillary: 122 mg/dL — ABNORMAL HIGH (ref 70–99)
Glucose-Capillary: 193 mg/dL — ABNORMAL HIGH (ref 70–99)
Glucose-Capillary: 204 mg/dL — ABNORMAL HIGH (ref 70–99)

## 2014-02-24 LAB — LACTATE DEHYDROGENASE: LDH: 595 U/L — AB (ref 94–250)

## 2014-02-24 LAB — LACTIC ACID, PLASMA: LACTIC ACID, VENOUS: 5.9 mmol/L — AB (ref 0.5–2.2)

## 2014-02-24 LAB — URIC ACID: Uric Acid, Serum: 5.2 mg/dL (ref 4.0–7.8)

## 2014-02-24 MED ORDER — SODIUM CHLORIDE 0.9 % IV BOLUS (SEPSIS)
250.0000 mL | Freq: Once | INTRAVENOUS | Status: AC
  Administered 2014-02-24: 250 mL via INTRAVENOUS

## 2014-02-24 MED ORDER — HYDROCORTISONE NA SUCCINATE PF 100 MG IJ SOLR
50.0000 mg | Freq: Three times a day (TID) | INTRAMUSCULAR | Status: DC
  Administered 2014-02-24 – 2014-02-27 (×8): 50 mg via INTRAVENOUS
  Filled 2014-02-24 (×11): qty 1

## 2014-02-24 MED ORDER — DOXYCYCLINE HYCLATE 100 MG IV SOLR
100.0000 mg | Freq: Two times a day (BID) | INTRAVENOUS | Status: DC
  Administered 2014-02-24 – 2014-03-01 (×10): 100 mg via INTRAVENOUS
  Filled 2014-02-24 (×13): qty 100

## 2014-02-24 MED ORDER — DEXTROSE 5 % IV SOLN
1.0000 g | INTRAVENOUS | Status: DC
  Filled 2014-02-24 (×2): qty 10

## 2014-02-24 MED ORDER — SODIUM CHLORIDE 0.9 % IV BOLUS (SEPSIS)
500.0000 mL | INTRAVENOUS | Status: AC | PRN
  Administered 2014-02-28: 500 mL via INTRAVENOUS

## 2014-02-24 NOTE — Progress Notes (Addendum)
Patient ID: Douglas [name]  male  [id number]    DOB: March 16, 1956    DOA: 02/27/2014  PCP: [name], MD   Brief interim history  Patient is a 58 year old male with history of follicular low-grade non-Hodgkin lymphoma, large left groin lymphadenopathy, diagnosed in 2000 and was on recent chemotherapy, had admission for acute renal failure 3/17- 3/20. He was suspected to have acute renal failure de to HCTZ and ARB.  On 4/3 admission, patient was noted to be hyperkalemic 7.6 , hyponatremic and a creatinine of 7.9, uric acid level of 14.3 with LDH 444.  On 3/20 cr was 1.9. He required emergent hemodialysis and was admitted by critical care service. He was transferred from [facility] to [facility] by renal service recommendations.    SIGNIFICANT EVENTS / STUDIES:  4/3 Hyperkalemia, hyperuricemia, hyperphosphatemia, with ARF requiring emergent CRRT. UA with urate crystals. Renal US normal. No pulmonary edema on CXR. Heparin gtt started due to concern for PE. Started IV vanc for left groin wound.  4/4 ARF thought to be due to ATN from severe sepsis, per oncology not tumor lysis syndrome. No DVT, heparin gtt stopped. 2D-echo normal.  4/5 Continue CRRT, to transition to HD, no renal recovery yet. Hyperuricemia resolved.  4/6  . 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 Hulen Skains on 02/24/2014, patient to be discharged with home health and wound VAC, will follow with Dr. Barkley Bruns her in the office post discharge. General surgeon no antibiotics needed from this standpoint.  -Noted wound growing gram-negative rods we'll switch to doxycycline on 02/24/2014 and monitor temperature curve and CBC. Although CBC could be misleading due to underlying history of non-Hodgkin's lymphoma.     Postdialysis tachycardic and hypotensive on 02-24-2014  Likely due to fluid removal -ve 2.5 lis today, normal saline bolus, increase Solu-Cortef which was started in ICU, check lactic acid level, repeat chest x-ray and UA, monitor temperature. And monitor clinically. Currently nontoxic appearing.        Type 2 diabetes mellitus - Continue sliding scale insulin  CBG (last 3)   Recent Labs  02/23/14 2038 02/24/14 0012 02/24/14 0418  GLUCAP 133* 182* 125*      GERD with history of GI bleed: Continue PPI         DVT Prophylaxis: Heparin subcutaneous  Code Status: Full CODE STATUS  Family Communication:  Disposition: Home with wound nursing and with wound VAC  Consultants:  Surgery  Nephrology  Oncology  Procedures:  Dialysis  Wound VAC  Antibiotics:  Antibiotics Given (last 72 hours)   Date/Time Action Medication Dose Rate   02/21/14 2207 Given   vancomycin (VANCOCIN) 1,500 mg in sodium chloride 0.9 % 500 mL IVPB 1,500 mg 250 mL/hr  Subjective: Denies any specific complaints, alert and oriented  Objective: Weight change:   Intake/Output Summary (Last 24 hours) at 02/24/14 1231 Last data filed at 02/24/14 0600  Gross per 24 hour  Intake    600 ml  Output    675 ml  Net    -75 ml   Blood pressure 102/67, pulse 105, temperature 97.7 F (36.5 C),  temperature source Oral, resp. rate 17, height 5' 10.08" (1.78 m), weight 211.83 kg (467 lb), SpO2 99.00%.  Physical Exam: General: Alert and awake, oriented x3, not in any acute distress. CVS: S1-S2 clear, no murmur rubs or gallops Chest: clear to auscultation bilaterally, no wheezing, rales or rhonchi Abdomen: soft  nondistended, normal bowel sounds  Extremities: no cyanosis, clubbing. 3+ edema noted bilaterally   Lab Results: Basic Metabolic Panel:  Recent Labs Lab 02/22/14 0500  02/23/14 0647 02/24/14 0545  NA 130*  < > 135* 132*  K 4.3  < > 3.9 4.4  CL 88*  < > 92* 90*  CO2 20  < > 21 21  GLUCOSE 203*  < > 134* 125*  BUN 62*  < > 38* 51*  CREATININE 4.48*  < > 3.63* 5.00*  CALCIUM 8.7  < > 9.2 9.4  MG 2.5  --   --   --   PHOS  --   < > 3.0  --   < > = values in this interval not displayed. Liver Function Tests:  Recent Labs Lab 02/23/14 0647 02/24/14 0545  AST 36 33  ALT 12 11  ALKPHOS 87 85  BILITOT 0.3 0.3  PROT 7.1 6.8  ALBUMIN 2.5* 2.4*   No results found for this basename: LIPASE, AMYLASE,  in the last 168 hours No results found for this basename: AMMONIA,  in the last 168 hours CBC:  Recent Labs Lab 02/23/14 0647 02/24/14 0640  WBC 13.1* 15.1*  NEUTROABS 11.1* 13.0*  HGB 9.2* 9.2*  HCT 28.3* 28.8*  MCV 86.3 86.7  PLT 158 157   Cardiac Enzymes:  Recent Labs Lab 2014/02/20 1220 2014-02-20 1500  CKTOTAL  --  40  CKMB  --  1.4  TROPONINI <0.30  --    BNP: No components found with this basename: POCBNP,  CBG:  Recent Labs Lab 02/23/14 1159 02/23/14 1701 02/23/14 2038 02/24/14 0012 02/24/14 0418  GLUCAP 190* 139* 133* 182* 125*     Micro Results: Recent Results (from the past 240 hour(s))  MRSA PCR SCREENING     Status: Abnormal   Collection Time    2014-02-20  5:27 PM      Result Value Ref Range Status   MRSA by PCR POSITIVE (*) NEGATIVE Final   Comment:            The GeneXpert MRSA Assay (FDA     approved for NASAL specimens      only), is one component of a     comprehensive MRSA colonization     surveillance program. It is not     intended to diagnose MRSA     infection nor to guide or     monitor treatment for     MRSA infections.     RESULT CALLED TO, READ BACK BY AND VERIFIED WITH:     Linda Hedges RN 2154 02/20/14 A BROWNING  WOUND CULTURE     Status: None   Collection Time    Feb 20, 2014  5:27 PM      Result Value Ref Range Status  Specimen Description WOUND   Final   Special Requests IMMUNOCOMPROMISED LEFT INGUINAL AREA   Final   Gram Stain     Final   Value: NO WBC SEEN     NO SQUAMOUS EPITHELIAL CELLS SEEN     RARE GRAM NEGATIVE RODS     Performed at [facility]   Culture     Final   Value: MULTIPLE ORGANISMS PRESENT, NONE PREDOMINANT     Note: NO STAPHYLOCOCCUS AUREUS ISOLATED NO GROUP A STREP (S.PYOGENES) ISOLATED     Performed at [facility]   Report Status 02/22/2014 FINAL   Final  CULTURE, BLOOD (ROUTINE X 2)     Status: None   Collection Time    02/21/14  5:31 PM      Result Value Ref Range Status   Specimen Description BLOOD LEFT HAND   Final   Special Requests BOTTLES DRAWN AEROBIC ONLY 8CC   Final   Culture  Setup Time     Final   Value: 02/22/2014 02:22     Performed at [facility]   Culture     Final   Value:        BLOOD CULTURE RECEIVED NO GROWTH TO DATE CULTURE WILL BE HELD FOR 5 DAYS BEFORE ISSUING A FINAL NEGATIVE REPORT     Performed at [facility]   Report Status PENDING   Incomplete  CULTURE, BLOOD (ROUTINE X 2)     Status: None   Collection Time    02/21/14  5:40 PM      Result Value Ref Range Status   Specimen Description BLOOD LEFT HAND   Final   Special Requests BOTTLES DRAWN AEROBIC ONLY 10 CC   Final   Culture  Setup Time     Final   Value: 02/22/2014 02:23     Performed at [facility]   Culture     Final   Value:        BLOOD CULTURE RECEIVED NO GROWTH TO DATE CULTURE WILL BE HELD FOR 5 DAYS BEFORE ISSUING A FINAL  NEGATIVE REPORT     Performed at [facility]   Report Status PENDING   Incomplete    Studies/Results: Dg Chest 1 View  02/03/2014   CLINICAL DATA:  Central catheter placement  EXAM: CHEST - 1 VIEW  COMPARISON:  Chest CT December 02, 2013  FINDINGS: Central catheter tip is in the right axillary vein region. There is again noted a 1.7 x 1.3 cm nodular opacity in the right midlung. There is no edema or consolidation. Heart is mildly enlarged with normal pulmonary vascularity. No apparent adenopathy. No pneumothorax. No bone lesions.  IMPRESSION: Central catheter tip in right axillary vein. Nodular opacity right mid lung. No edema or consolidation.   Electronically Signed   By: Lowella Grip M.D.   On: 02/03/2014 07:06   Dg Chest 2 View  02/16/2014   CLINICAL DATA:  2 week history shortness of breath ; the patient has a history of non-Hodgkin's lymphoma  EXAM: CHEST  2 VIEW  COMPARISON:  DG CHEST 1 VIEW dated 02/03/2014  FINDINGS: The lungs are adequately inflated. There is an abnormal soft tissue density nodule that projects in the lower 1/3 of the right hemithorax on the frontal view. It measures 1.4 x 1.9 cm in diameter. It may lie anteriorly in the right middle lobe. This is similar in appearance to that demonstrated on the previous study. Elsewhere no definite pulmonary nodules  are demonstrated. There is no alveolar infiltrate. The cardiac silhouette is normal in size. The pulmonary vascularity is not engorged. There is mild degenerative change of the thoracic spine with calcification of the anterior longitudinal ligament.  IMPRESSION: 1. There is persistent nodularity likely in the right middle lobe. There is no definite evidence of pneumonia. 2. There is no evidence of CHF. 3. Further evaluation of the thorax with chest CT scanning is recommended. This would allow further evaluation of the mediastinum and hilar regions as well as the pulmonary parenchyma given the patient's symptoms of  shortness of breath.   Electronically Signed   By: David  Martinique   On: 02/16/2014 10:56   US Renal  03/14/2014   CLINICAL DATA:  Evaluate for hydronephrosis  EXAM: RENAL/URINARY TRACT ULTRASOUND COMPLETE  COMPARISON:  US RENAL dated 02/03/2014; [place] ART/VEN FLOW ABD PELV DOPPLER dated 11/27/2013; US SCROTUM dated 11/27/2013  FINDINGS: Right Kidney:  Length: 11.5 cm. Echogenicity within normal limits. No mass or hydronephrosis visualized.  Left Kidney:  Length: 12.2 cm. Echogenicity within normal limits. No mass or hydronephrosis visualized.  Bladder:  Urinary bladder is decompressed, patient is status post Foley catheter insertion.  IMPRESSION: Negative renal ultrasound.   Electronically Signed   By: Margaree Mackintosh M.D.   On: 03/18/2014 16:09   US Renal  02/03/2014   CLINICAL DATA:  Acute kidney injury.  EXAM: RENAL/URINARY TRACT ULTRASOUND COMPLETE  COMPARISON:  CT ABD/PELVIS W CM dated 10/30/2013  FINDINGS: Right Kidney:  Length: 11.6 cm. Echogenicity within normal limits. No mass or hydronephrosis visualized.  Left Kidney:  Length: 11.8 cm. Echogenicity within normal limits. No mass or hydronephrosis visualized.  Bladder:  Appears normal for degree of bladder distention.  IMPRESSION: Unremarkable study.   Electronically Signed   By: Rolm Baptise M.D.   On: 02/03/2014 16:56   Dg Chest Port 1 View  02/23/2014   CLINICAL DATA:  Respiratory distress.  EXAM: PORTABLE CHEST - 1 VIEW  COMPARISON:  DG CHEST 1V PORT dated 02/20/2014; CT CHEST W/CM dated 12/02/2013; DG CHEST 1V PORT dated 02/25/2014; DG CHEST 1V PORT dated 02/23/2014  FINDINGS: Cardiomediastinal silhouette remains stable and normal. Dialysis catheter tips SVC RA junction, unchanged. No focal infiltrates, although right lung nodular densities persist. Negative osseous structures.  IMPRESSION: No active infiltrates.   Electronically Signed   By: Rolla Flatten M.D.   On: 02/23/2014 08:10   Dg Chest Port 1 View  02/20/2014   CLINICAL DATA:  Shortness of breath   EXAM: PORTABLE CHEST - 1 VIEW  COMPARISON:  03/12/2014  FINDINGS: A temporary dialysis catheter is again noted on the right. The cardiac shadow is stable. The lungs are well aerated bilaterally without focal infiltrate or sizable effusion.  IMPRESSION: No acute abnormality noted.   Electronically Signed   By: Inez Catalina M.D.   On: 02/20/2014 08:08   Dg Chest Port 1 View  03/11/2014   CLINICAL DATA:  Status post central line placement  EXAM: PORTABLE CHEST - 1 VIEW  COMPARISON:  02/24/2014 1251 hrs  FINDINGS: The cardiac shadow is stable. A temporary dialysis catheter is now seen in the distal superior vena cava. No pneumothorax is noted. Mild atelectatic changes are noted in the right base. This may be related to the poor inspiratory effort.  IMPRESSION: Status post temporary dialysis catheter placement without pneumothorax.   Electronically Signed   By: Inez Catalina M.D.   On: 03/06/2014 19:31   Dg Chest Portable 1 View  03/02/2014   CLINICAL DATA:  Shortness of breath. Non-Hodgkin's lymphoma. Diabetes and hypertension.  EXAM: PORTABLE CHEST - 1 VIEW  COMPARISON:  DG CHEST 2 VIEW dated 02/16/2014; DG CHEST 1 VIEW dated 02/03/2014; CT CHEST W/CM dated 12/02/2013  FINDINGS: The heart is mildly enlarged. Right middle lobe nodule appears larger, now measuring 2.7 cm. Probable right upper lobe nodule is only partially seen, measuring approximately 1 cm in diameter radiographically. Lesion was larger based on prior CT evaluation. There is slight prominence the left hilar region but this is felt to be stable compared with prior radiographs and CT. No new consolidation or pleural effusion.  IMPRESSION: Persistent right lung nodules. Right middle lobe nodule is larger ring compared prior studies.   Electronically Signed   By: Shon Hale M.D.   On: 03/12/2014 13:08    Medications: Scheduled Meds: . cefTRIAXone (ROCEPHIN)  IV  1 g Intravenous Q24H  . heparin subcutaneous  5,000 Units Subcutaneous 3 times per day  .  hydrocortisone sod succinate (SOLU-CORTEF) inj  25 mg Intravenous Q12H  . insulin aspart  0-9 Units Subcutaneous 6 times per day  . multivitamin  1 tablet Oral QHS  . mupirocin ointment  1 application Nasal BID  . pantoprazole  40 mg Oral Q1200  . sodium chloride  3 mL Intravenous Q12H      LOS: 5 days   Thurnell Lose M.D. [facility] 02/24/2014, 12:31 PM Pager: [phone]  If 7PM-7AM, please contact night-coverage [email or web address] Password [email or web address]

## 2014-02-24 NOTE — Consult Note (Signed)
WOC wound follow up Wound type: NPWT VAC dressing change with tunnels  Measurement: 2cm x 6cm x 3cm with tunnel at 9 o'clock (it now measures 12 + cm) I can not reach the base with the sterile applicator.  Tunnel at 3 o'clock 2cm  Wound HYQ:MVHQI red, granulation tissue. Today it is filing up briskly with lymphatic fluid and the canister was full at the time of my visit.   Drainage (amount, consistency, odor) lymphatic fluid in canister (full) and filled the wound bed immediately.  Periwound:intact  Dressing procedure/placement/frequency: White foam used to fill the tunnel  @ 9 o'clock , however I now can not reach the base of the tunnel, used white foam also to fill the tunnel at 3 o'clock. Black foam used to placed to fill the remainder of the wound bed, seal at 172mmHG, pt tolerated without problems.     [place] team will follow along with you for VAC changes due to complexity of tunneling and need to pack Deyanira Fesler [facility] RN,CWOCN

## 2014-02-24 NOTE — Progress Notes (Signed)
Subjective: Interval History: has complaints, worried about not being out of bed. Weak.  .  Objective: Vital signs in last 24 hours: Temp:  [97.7 F (36.5 C)-98.7 F (37.1 C)] 97.7 F (36.5 C) (04/08 0800) Pulse Rate:  [80-105] 105 (04/08 0930) Resp:  [15-20] 16 (04/08 0930) BP: (100-114)/(54-69) 109/67 mmHg (04/08 0930) SpO2:  [99 %-100 %] 99 % (04/08 0800) Weight change:   Intake/Output from previous day: 04/07 0701 - 04/08 0700 In: 840 [P.O.:840] Out: 675 [Drains:675] Intake/Output this shift:    General appearance: alert, cooperative and morbidly obese Resp: diminished breath sounds bilaterally Cardio: regular rate and rhythm and systolic murmur: holosystolic 2/6, blowing at apex GI: obese, pos bs, soft.[name] liver down 5 cm Extremities: presacral edema, chronic changes in legs with lichenification  Lab Results:  Recent Labs  02/23/14 0647 02/24/14 0640  WBC 13.1* 15.1*  HGB 9.2* 9.2*  HCT 28.3* 28.8*  PLT 158 157   BMET:  Recent Labs  02/23/14 0647 02/24/14 0545  NA 135* 132*  K 3.9 4.4  CL 92* 90*  CO2 21 21  GLUCOSE 134* 125*  BUN 38* 51*  CREATININE 3.63* 5.00*  CALCIUM 9.2 9.4   No results found for this basename: PTH,  in the last 72 hours Iron Studies: No results found for this basename: IRON, TIBC, TRANSFERRIN, FERRITIN,  in the last 72 hours  Studies/Results: Dg Chest Port 1 View  02/23/2014   CLINICAL DATA:  Respiratory distress.  EXAM: PORTABLE CHEST - 1 VIEW  COMPARISON:  DG CHEST 1V PORT dated 02/20/2014; CT CHEST W/CM dated 12/02/2013; DG CHEST 1V PORT dated 02/26/2014; DG CHEST 1V PORT dated 03/03/2014  FINDINGS: Cardiomediastinal silhouette remains stable and normal. Dialysis catheter tips SVC RA junction, unchanged. No focal infiltrates, although right lung nodular densities persist. Negative osseous structures.  IMPRESSION: No active infiltrates.   Electronically Signed   By: Rolla Flatten M.D.   On: 02/23/2014 08:10    I have reviewed the  patient's current medications.  Assessment/Plan: 1 AKI  Had urine x1 yest.  No function yet.  Should get recovery.  Sepsis vs TLS as cause 2 Anemia stable 3 obesity 4 lymphoma per ONC 5 Cellulitis/wound on AB and vac 6 DM stable 7 Immobility mobilize 8 GIB stable 9 ^ Lipids P HD, follow Hb, follow urine, mobilize,  AB , VAC    LOS: 5 days   Garreth L Piper Albro 02/24/2014,10:03 AM

## 2014-02-24 NOTE — Procedures (Signed)
I was present at this session.  I have reviewed the session itself and made appropriate changes.  Olc green. Cath flow 350. Some vol xs but bp low 100s  Hugh L Adriena Manfre 4/8/201510:01 AM

## 2014-02-25 LAB — RENAL FUNCTION PANEL
Albumin: 2.3 g/dL — ABNORMAL LOW (ref 3.5–5.2)
BUN: 35 mg/dL — ABNORMAL HIGH (ref 6–23)
CO2: 19 meq/L (ref 19–32)
CREATININE: 3.87 mg/dL — AB (ref 0.50–1.35)
Calcium: 9.2 mg/dL (ref 8.4–10.5)
Chloride: 91 mEq/L — ABNORMAL LOW (ref 96–112)
GFR calc Af Amer: 19 mL/min — ABNORMAL LOW (ref >90)
GFR, EST NON AFRICAN AMERICAN: 16 mL/min — AB (ref >90)
GLUCOSE: 112 mg/dL — AB (ref 70–99)
Phosphorus: 3.7 mg/dL (ref 2.3–4.6)
Potassium: 4.2 mEq/L (ref 3.7–5.3)
Sodium: 135 mEq/L — ABNORMAL LOW (ref 137–147)

## 2014-02-25 LAB — CBC WITH DIFFERENTIAL/PLATELET
Basophils Absolute: 0 10*3/uL (ref 0.0–0.1)
Basophils Relative: 0 % (ref 0–1)
EOS PCT: 1 % (ref 0–5)
Eosinophils Absolute: 0.1 10*3/uL (ref 0.0–0.7)
HEMATOCRIT: 26.2 % — AB (ref 39.0–52.0)
Hemoglobin: 8.6 g/dL — ABNORMAL LOW (ref 13.0–17.0)
LYMPHS ABS: 0.8 10*3/uL (ref 0.7–4.0)
Lymphocytes Relative: 6 % — ABNORMAL LOW (ref 12–46)
MCH: 28.4 pg (ref 26.0–34.0)
MCHC: 32.8 g/dL (ref 30.0–36.0)
MCV: 86.5 fL (ref 78.0–100.0)
Monocytes Absolute: 1.1 10*3/uL — ABNORMAL HIGH (ref 0.1–1.0)
Monocytes Relative: 9 % (ref 3–12)
Neutro Abs: 10.5 10*3/uL — ABNORMAL HIGH (ref 1.7–7.7)
Neutrophils Relative %: 84 % — ABNORMAL HIGH (ref 43–77)
PLATELETS: 161 10*3/uL (ref 150–400)
RBC: 3.03 MIL/uL — AB (ref 4.22–5.81)
RDW: 18.2 % — ABNORMAL HIGH (ref 11.5–15.5)
WBC: 12.4 10*3/uL — AB (ref 4.0–10.5)

## 2014-02-25 LAB — GLUCOSE, CAPILLARY
GLUCOSE-CAPILLARY: 128 mg/dL — AB (ref 70–99)
GLUCOSE-CAPILLARY: 139 mg/dL — AB (ref 70–99)
Glucose-Capillary: 136 mg/dL — ABNORMAL HIGH (ref 70–99)
Glucose-Capillary: 166 mg/dL — ABNORMAL HIGH (ref 70–99)
Glucose-Capillary: 182 mg/dL — ABNORMAL HIGH (ref 70–99)
Glucose-Capillary: 160 mg/dL — ABNORMAL HIGH (ref 70–99)

## 2014-02-25 LAB — URIC ACID: URIC ACID, SERUM: 4.2 mg/dL (ref 4.0–7.8)

## 2014-02-25 MED ORDER — CIPROFLOXACIN IN D5W 400 MG/200ML IV SOLN
400.0000 mg | Freq: Two times a day (BID) | INTRAVENOUS | Status: DC
  Administered 2014-02-25 – 2014-02-28 (×7): 400 mg via INTRAVENOUS
  Filled 2014-02-25 (×9): qty 200

## 2014-02-25 NOTE — Progress Notes (Signed)
Patient ID: Douglas [name]  male  ZOX:[id number]    DOB: 1956-08-09    DOA: 03/18/2014  PCP: [name], MD   Brief interim history  Patient is a 58 year old male with history of follicular low-grade non-Hodgkin lymphoma, large left groin lymphadenopathy, diagnosed in 2000 and was on recent chemotherapy, had admission for acute renal failure 3/17- 3/20. He was suspected to have acute renal failure de to HCTZ and ARB.  On 4/3 admission, patient was noted to be hyperkalemic 7.6 , hyponatremic and a creatinine of 7.9, uric acid level of 14.3 with LDH 444.  On 3/20 cr was 1.9. He required emergent hemodialysis and was admitted by critical care service. He was transferred from [facility] to [facility] by renal service recommendations.    SIGNIFICANT EVENTS / STUDIES:  4/3 Hyperkalemia, hyperuricemia, hyperphosphatemia, with ARF requiring emergent CRRT. UA with urate crystals. Renal US normal. No pulmonary edema on CXR. Heparin gtt started due to concern for PE. Started IV vanc for left groin wound.  4/4 ARF thought to be due to ATN from severe sepsis, per oncology not tumor lysis syndrome. No DVT, heparin gtt stopped. 2D-echo normal.  4/5 Continue CRRT, to transition to HD, no renal recovery yet. Hyperuricemia resolved.  4/6  . 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 Hulen Skains on 02/24/2014, patient to be discharged with home health and wound VAC, will follow with Dr. Barkley Bruns her in the office post discharge. General surgeon no antibiotics needed from this standpoint.  -Noted wound growing gram-negative rods we'll switch to doxycycline + cipro on 02/25/2014 and monitor temperature curve, leukocytosis improving. Although CBC could be misleading due to underlying history of non-Hodgkin's lymphoma.     Postdialysis tachycardic and hypotensive on 02-24-2014  Likely due to fluid removal -ve 2.5 lis today, normal saline bolus, increase Solu-Cortef which was started in ICU, check lactic acid level, repeat chest x-ray and UA, monitor temperature. And monitor clinically. Currently nontoxic appearing.        Type 2 diabetes mellitus - Continue sliding scale insulin  CBG (last 3)   Recent Labs  02/25/14 0110 02/25/14 0411 02/25/14 0758  GLUCAP 128* 136* 139*      GERD with history of GI bleed: Continue PPI         DVT Prophylaxis: Heparin subcutaneous  Code Status: Full CODE STATUS  Family Communication:  Disposition: Home with wound nursing and with wound VAC  Consultants:  Surgery  Nephrology  Oncology  Procedures:  Dialysis  Wound VAC  Antibiotics:  Antibiotics Given (last 72 hours)   Date/Time Action Medication Dose Rate   02/24/14 1150 Given   vancomycin (VANCOCIN) 1,500 mg in sodium chloride 0.9 % 250 mL IVPB 1,500 mg 250 mL/hr  02/24/14 2200 Given   doxycycline (VIBRAMYCIN) 100 mg in dextrose 5 % 250 mL IVPB 100 mg 125 mL/hr   02/25/14 0604 Given   doxycycline (VIBRAMYCIN) 100 mg in dextrose 5 % 250 mL IVPB 100 mg 125 mL/hr      Subjective:  Patient in bed, no fever chills, no headache chest abdominal pain, no shortness of breath,  generalized weakness.  Objective: Weight change:   Intake/Output Summary (Last 24 hours) at 02/25/14 1048 Last data filed at 02/25/14 0900  Gross per 24 hour  Intake   1080 ml  Output   2500 ml  Net  -1420 ml   Blood pressure 101/60, pulse 103, temperature 98.8 F (37.1 C), temperature source Oral, resp. rate 21, height 5' 10.08" (1.78 m), weight 211.83 kg (467 lb), SpO2 100.00%.  Physical Exam: General: Alert and awake, oriented x3, not in any acute distress. CVS: S1-S2 clear, no murmur rubs or gallops Chest: clear to auscultation bilaterally, no wheezing, rales or rhonchi Abdomen: soft  nondistended, normal bowel sounds  Extremities: no cyanosis, clubbing. 3+ edema noted bilaterally   Lab Results: Basic Metabolic Panel:  Recent Labs Lab 02/22/14 0500  02/23/14 0647 02/24/14 0545  NA 130*  < > 135* 132*  K 4.3  < > 3.9 4.4  CL 88*  < > 92* 90*  CO2 20  < > 21 21  GLUCOSE 203*  < > 134* 125*  BUN 62*  < > 38* 51*  CREATININE 4.48*  < > 3.63* 5.00*  CALCIUM 8.7  < > 9.2 9.4  MG 2.5  --   --   --   PHOS  --   < > 3.0  --   < > = values in this interval not displayed. Liver Function Tests:  Recent Labs Lab 02/23/14 0647 02/24/14 0545  AST 36 33  ALT 12 11  ALKPHOS 87 85  BILITOT 0.3 0.3  PROT 7.1 6.8  ALBUMIN 2.5* 2.4*   No results found for this basename: LIPASE, AMYLASE,  in the last 168 hours No results found for this basename: AMMONIA,  in the last 168 hours CBC:  Recent Labs Lab 02/24/14 0640 02/25/14 0637  WBC 15.1* 12.4*  NEUTROABS 13.0* 10.5*  HGB 9.2* 8.6*  HCT 28.8* 26.2*  MCV 86.7 86.5  PLT 157 161   Cardiac Enzymes:  Recent Labs Lab 03/06/2014 1220 02/17/2014 1500  CKTOTAL  --  40  CKMB  --  1.4  TROPONINI <0.30  --    BNP: No components found with this basename: POCBNP,  CBG:  Recent Labs Lab 02/24/14 1638 02/24/14 2039 02/25/14 0110 02/25/14 0411 02/25/14 0758  GLUCAP 193* 204* 128* 136* 139*     Micro  Results: Recent Results (from the past 240 hour(s))  MRSA PCR SCREENING     Status: Abnormal   Collection Time    02/18/2014  5:27 PM      Result Value Ref Range Status   MRSA by PCR POSITIVE (*) NEGATIVE Final   Comment:            The GeneXpert MRSA Assay (FDA     approved for NASAL specimens     only), is one component of a     comprehensive MRSA colonization     surveillance program. It is not     intended to diagnose MRSA     infection nor to guide or     monitor treatment for     MRSA infections.  RESULT CALLED TO, READ BACK BY AND VERIFIED WITH:     Linda Hedges RN [id number] 02/17/2014 A BROWNING  WOUND CULTURE     Status: None   Collection Time    02/22/2014  5:27 PM      Result Value Ref Range Status   Specimen Description WOUND   Final   Special Requests IMMUNOCOMPROMISED LEFT INGUINAL AREA   Final   Gram Stain     Final   Value: NO WBC SEEN     NO SQUAMOUS EPITHELIAL CELLS SEEN     RARE GRAM NEGATIVE RODS     Performed at [facility]   Culture     Final   Value: MULTIPLE ORGANISMS PRESENT, NONE PREDOMINANT     Note: NO STAPHYLOCOCCUS AUREUS ISOLATED NO GROUP A STREP (S.PYOGENES) ISOLATED     Performed at [facility]   Report Status 02/22/2014 FINAL   Final  CULTURE, BLOOD (ROUTINE X 2)     Status: None   Collection Time    02/21/14  5:31 PM      Result Value Ref Range Status   Specimen Description BLOOD LEFT HAND   Final   Special Requests BOTTLES DRAWN AEROBIC ONLY 8CC   Final   Culture  Setup Time     Final   Value: 02/22/2014 02:22     Performed at [facility]   Culture     Final   Value:        BLOOD CULTURE RECEIVED NO GROWTH TO DATE CULTURE WILL BE HELD FOR 5 DAYS BEFORE ISSUING A FINAL NEGATIVE REPORT     Performed at [facility]   Report Status PENDING   Incomplete  CULTURE, BLOOD (ROUTINE X 2)     Status: None   Collection Time    02/21/14  5:40 PM      Result Value Ref Range Status   Specimen Description BLOOD LEFT HAND    Final   Special Requests BOTTLES DRAWN AEROBIC ONLY 10 CC   Final   Culture  Setup Time     Final   Value: 02/22/2014 02:23     Performed at [facility]   Culture     Final   Value:        BLOOD CULTURE RECEIVED NO GROWTH TO DATE CULTURE WILL BE HELD FOR 5 DAYS BEFORE ISSUING A FINAL NEGATIVE REPORT     Performed at [facility]   Report Status PENDING   Incomplete    Studies/Results: Dg Chest 1 View  02/03/2014   CLINICAL DATA:  Central catheter placement  EXAM: CHEST - 1 VIEW  COMPARISON:  Chest CT December 02, 2013  FINDINGS: Central catheter tip is in the right axillary vein region. There is again noted a 1.7 x 1.3 cm nodular opacity in the right midlung. There is no edema or consolidation. Heart is mildly enlarged with normal pulmonary vascularity. No apparent adenopathy. No pneumothorax. No bone lesions.  IMPRESSION: Central catheter tip in right axillary vein. Nodular opacity right mid lung. No edema or consolidation.   Electronically Signed   By: Lowella Grip M.D.   On: 02/03/2014 07:06   Dg Chest 2 View  02/16/2014   CLINICAL DATA:  2 week history shortness of breath ; the patient has a history of non-Hodgkin's lymphoma  EXAM: CHEST  2 VIEW  COMPARISON:  DG CHEST 1 VIEW dated 02/03/2014  FINDINGS: The lungs are adequately inflated. There is an abnormal  soft tissue density nodule that projects in the lower 1/3 of the right hemithorax on the frontal view. It measures 1.4 x 1.9 cm in diameter. It may lie anteriorly in the right middle lobe. This is similar in appearance to that demonstrated on the previous study. Elsewhere no definite pulmonary nodules are demonstrated. There is no alveolar infiltrate. The cardiac silhouette is normal in size. The pulmonary vascularity is not engorged. There is mild degenerative change of the thoracic spine with calcification of the anterior longitudinal ligament.  IMPRESSION: 1. There is persistent nodularity likely in the right middle  lobe. There is no definite evidence of pneumonia. 2. There is no evidence of CHF. 3. Further evaluation of the thorax with chest CT scanning is recommended. This would allow further evaluation of the mediastinum and hilar regions as well as the pulmonary parenchyma given the patient's symptoms of shortness of breath.   Electronically Signed   By: David  Martinique   On: 02/16/2014 10:56   US Renal  02-27-2014   CLINICAL DATA:  Evaluate for hydronephrosis  EXAM: RENAL/URINARY TRACT ULTRASOUND COMPLETE  COMPARISON:  US RENAL dated 02/03/2014; [place] ART/VEN FLOW ABD PELV DOPPLER dated 11/27/2013; US SCROTUM dated 11/27/2013  FINDINGS: Right Kidney:  Length: 11.5 cm. Echogenicity within normal limits. No mass or hydronephrosis visualized.  Left Kidney:  Length: 12.2 cm. Echogenicity within normal limits. No mass or hydronephrosis visualized.  Bladder:  Urinary bladder is decompressed, patient is status post Foley catheter insertion.  IMPRESSION: Negative renal ultrasound.   Electronically Signed   By: Margaree Mackintosh M.D.   On: 2014/02/27 16:09   US Renal  02/03/2014   CLINICAL DATA:  Acute kidney injury.  EXAM: RENAL/URINARY TRACT ULTRASOUND COMPLETE  COMPARISON:  CT ABD/PELVIS W CM dated 10/30/2013  FINDINGS: Right Kidney:  Length: 11.6 cm. Echogenicity within normal limits. No mass or hydronephrosis visualized.  Left Kidney:  Length: 11.8 cm. Echogenicity within normal limits. No mass or hydronephrosis visualized.  Bladder:  Appears normal for degree of bladder distention.  IMPRESSION: Unremarkable study.   Electronically Signed   By: Rolm Baptise M.D.   On: 02/03/2014 16:56   Dg Chest Port 1 View  02/23/2014   CLINICAL DATA:  Respiratory distress.  EXAM: PORTABLE CHEST - 1 VIEW  COMPARISON:  DG CHEST 1V PORT dated 02/20/2014; CT CHEST W/CM dated 12/02/2013; DG CHEST 1V PORT dated 02/27/14; DG CHEST 1V PORT dated 02/27/14  FINDINGS: Cardiomediastinal silhouette remains stable and normal. Dialysis catheter tips SVC RA  junction, unchanged. No focal infiltrates, although right lung nodular densities persist. Negative osseous structures.  IMPRESSION: No active infiltrates.   Electronically Signed   By: Rolla Flatten M.D.   On: 02/23/2014 08:10   Dg Chest Port 1 View  02/20/2014   CLINICAL DATA:  Shortness of breath  EXAM: PORTABLE CHEST - 1 VIEW  COMPARISON:  02/27/2014  FINDINGS: A temporary dialysis catheter is again noted on the right. The cardiac shadow is stable. The lungs are well aerated bilaterally without focal infiltrate or sizable effusion.  IMPRESSION: No acute abnormality noted.   Electronically Signed   By: Inez Catalina M.D.   On: 02/20/2014 08:08   Dg Chest Port 1 View  2014/02/27   CLINICAL DATA:  Status post central line placement  EXAM: PORTABLE CHEST - 1 VIEW  COMPARISON:  02/27/2014 1251 hrs  FINDINGS: The cardiac shadow is stable. A temporary dialysis catheter is now seen in the distal superior vena cava. No pneumothorax is noted.  Mild atelectatic changes are noted in the right base. This may be related to the poor inspiratory effort.  IMPRESSION: Status post temporary dialysis catheter placement without pneumothorax.   Electronically Signed   By: Inez Catalina M.D.   On: 03/03/2014 19:31   Dg Chest Portable 1 View  03/11/2014   CLINICAL DATA:  Shortness of breath. Non-Hodgkin's lymphoma. Diabetes and hypertension.  EXAM: PORTABLE CHEST - 1 VIEW  COMPARISON:  DG CHEST 2 VIEW dated 02/16/2014; DG CHEST 1 VIEW dated 02/03/2014; CT CHEST W/CM dated 12/02/2013  FINDINGS: The heart is mildly enlarged. Right middle lobe nodule appears larger, now measuring 2.7 cm. Probable right upper lobe nodule is only partially seen, measuring approximately 1 cm in diameter radiographically. Lesion was larger based on prior CT evaluation. There is slight prominence the left hilar region but this is felt to be stable compared with prior radiographs and CT. No new consolidation or pleural effusion.  IMPRESSION: Persistent right lung  nodules. Right middle lobe nodule is larger ring compared prior studies.   Electronically Signed   By: Shon Hale M.D.   On: 03/07/2014 13:08    Medications: Scheduled Meds: . ciprofloxacin  400 mg Intravenous Q12H  . doxycycline (VIBRAMYCIN) IV  100 mg Intravenous Q12H  . heparin subcutaneous  5,000 Units Subcutaneous 3 times per day  . hydrocortisone sod succinate (SOLU-CORTEF) inj  50 mg Intravenous Q8H  . insulin aspart  0-9 Units Subcutaneous 6 times per day  . multivitamin  1 tablet Oral QHS  . pantoprazole  40 mg Oral Q1200  . sodium chloride  3 mL Intravenous Q12H      LOS: 6 days   Thurnell Lose M.D. [facility] 02/25/2014, 10:48 AM Pager: [phone]  If 7PM-7AM, please contact night-coverage [email or web address] Password [email or web address]

## 2014-02-25 NOTE — Progress Notes (Signed)
RT note:  Filled chamber with distilled water, machine set up at bedside with FFM. Machine set on 9.5 cm H20 per patient home settings.  Patient is able to place himself on/off as needed. RN aware.

## 2014-02-25 NOTE — Progress Notes (Signed)
CPAP setup at bedside. Water chamber refilled. Pt states that he is able to put mask on when he is ready. RT explained to call if help needed. RT will monitor

## 2014-02-25 NOTE — Progress Notes (Signed)
Subjective: Interval History: has no complaint but no urine.  Objective: Vital signs in last 24 hours: Temp:  [97.7 F (36.5 C)-98.8 F (37.1 C)] 98.8 F (37.1 C) (04/09 0900) Pulse Rate:  [97-119] 103 (04/09 0900) Resp:  [16-21] 21 (04/09 0900) BP: (92-115)/(50-67) 101/60 mmHg (04/09 0900) SpO2:  [94 %-100 %] 100 % (04/09 0900) Weight change:   Intake/Output from previous day: 04/08 0701 - 04/09 0700 In: 840 [P.O.:840] Out: 2500  Intake/Output this shift: Total I/O In: 240 [P.O.:240] Out: -   General appearance: alert, cooperative and morbidly obese Resp: diminished breath sounds bilaterally Cardio: S1, S2 normal and systolic murmur: holosystolic 2/6, blowing at apex GI: obese, pos bs, liver down 6 cm,  Extremities: IJ CAth on R, L groin and upper leg swollen  Lab Results:  Recent Labs  02/24/14 0640 02/25/14 0637  WBC 15.1* 12.4*  HGB 9.2* 8.6*  HCT 28.8* 26.2*  PLT 157 161   BMET:  Recent Labs  02/23/14 0647 02/24/14 0545  NA 135* 132*  K 3.9 4.4  CL 92* 90*  CO2 21 21  GLUCOSE 134* 125*  BUN 38* 51*  CREATININE 3.63* 5.00*  CALCIUM 9.2 9.4   No results found for this basename: PTH,  in the last 72 hours Iron Studies: No results found for this basename: IRON, TIBC, TRANSFERRIN, FERRITIN,  in the last 72 hours  Studies/Results: Dg Chest Port 1 View  02/25/2014   CLINICAL DATA:  Cough and chest pain  EXAM: PORTABLE CHEST - 1 VIEW  COMPARISON:  02/23/2014  FINDINGS: Unchanged positioning of right IJ catheter, tip at the lower SVC. Unchanged heart size and mediastinal contours.  No edema, effusion, consolidation, or pneumothorax. Right-sided pulmonary parenchymal nodularity is again noted, with a dominant 2 cm nodule in the lower right chest, likely representing a middle lobe nodule seen on previous chest CT 12/02/2013. These are stable from recent comparison examinations, but likely progressed from 12/02/2013.  IMPRESSION: 1.  No active disease. 2. Right  lung nodularity which is stable compared to recent studies, but enlarged from 12/02/2013 chest CT comparison.   Electronically Signed   By: Jorje Guild M.D.   On: 02/25/2014 01:21    I have reviewed the patient's current medications.  Assessment/Plan: 1 AKI no resolution.  Chem P.  Vol ok. 2 anemia stable 3 morbid obesity 4 Groin wound AB and local care.  P HD, AB, wound care.  If no resolution of AKI, PC by Mon.    LOS: 6 days   Myers L Anaysha Andre 02/25/2014,11:58 AM

## 2014-02-26 LAB — RENAL FUNCTION PANEL
Albumin: 2.2 g/dL — ABNORMAL LOW (ref 3.5–5.2)
BUN: 48 mg/dL — AB (ref 6–23)
CO2: 18 meq/L — AB (ref 19–32)
CREATININE: 5.1 mg/dL — AB (ref 0.50–1.35)
Calcium: 9.5 mg/dL (ref 8.4–10.5)
Chloride: 89 mEq/L — ABNORMAL LOW (ref 96–112)
GFR calc non Af Amer: 11 mL/min — ABNORMAL LOW (ref >90)
GFR, EST AFRICAN AMERICAN: 13 mL/min — AB (ref >90)
Glucose, Bld: 128 mg/dL — ABNORMAL HIGH (ref 70–99)
Phosphorus: 4.8 mg/dL — ABNORMAL HIGH (ref 2.3–4.6)
Potassium: 4.2 mEq/L (ref 3.7–5.3)
Sodium: 130 mEq/L — ABNORMAL LOW (ref 137–147)

## 2014-02-26 LAB — CBC WITH DIFFERENTIAL/PLATELET
BASOS ABS: 0 10*3/uL (ref 0.0–0.1)
BASOS PCT: 0 % (ref 0–1)
EOS ABS: 0 10*3/uL (ref 0.0–0.7)
Eosinophils Relative: 0 % (ref 0–5)
HCT: 24.6 % — ABNORMAL LOW (ref 39.0–52.0)
Hemoglobin: 8.2 g/dL — ABNORMAL LOW (ref 13.0–17.0)
Lymphocytes Relative: 8 % — ABNORMAL LOW (ref 12–46)
Lymphs Abs: 1 10*3/uL (ref 0.7–4.0)
MCH: 28.4 pg (ref 26.0–34.0)
MCHC: 33.3 g/dL (ref 30.0–36.0)
MCV: 85.1 fL (ref 78.0–100.0)
MONO ABS: 1.3 10*3/uL — AB (ref 0.1–1.0)
MONOS PCT: 10 % (ref 3–12)
Neutro Abs: 10.2 10*3/uL — ABNORMAL HIGH (ref 1.7–7.7)
Neutrophils Relative %: 82 % — ABNORMAL HIGH (ref 43–77)
Platelets: 156 10*3/uL (ref 150–400)
RBC: 2.89 MIL/uL — AB (ref 4.22–5.81)
RDW: 17.8 % — AB (ref 11.5–15.5)
WBC: 12.5 10*3/uL — AB (ref 4.0–10.5)

## 2014-02-26 LAB — GLUCOSE, CAPILLARY
GLUCOSE-CAPILLARY: 101 mg/dL — AB (ref 70–99)
GLUCOSE-CAPILLARY: 176 mg/dL — AB (ref 70–99)
GLUCOSE-CAPILLARY: 237 mg/dL — AB (ref 70–99)
Glucose-Capillary: 171 mg/dL — ABNORMAL HIGH (ref 70–99)
Glucose-Capillary: 136 mg/dL — ABNORMAL HIGH (ref 70–99)

## 2014-02-26 LAB — CBC
HCT: 25.1 % — ABNORMAL LOW (ref 39.0–52.0)
HEMOGLOBIN: 8.2 g/dL — AB (ref 13.0–17.0)
MCH: 28.1 pg (ref 26.0–34.0)
MCHC: 32.7 g/dL (ref 30.0–36.0)
MCV: 86 fL (ref 78.0–100.0)
Platelets: 150 10*3/uL (ref 150–400)
RBC: 2.92 MIL/uL — AB (ref 4.22–5.81)
RDW: 17.7 % — ABNORMAL HIGH (ref 11.5–15.5)
WBC: 13.5 10*3/uL — ABNORMAL HIGH (ref 4.0–10.5)

## 2014-02-26 LAB — URIC ACID: Uric Acid, Serum: 5.5 mg/dL (ref 4.0–7.8)

## 2014-02-26 MED ORDER — SODIUM CHLORIDE 0.9 % IV SOLN: 100.0000 mL | INTRAVENOUS | Status: DC | PRN

## 2014-02-26 MED ORDER — NEPRO/CARBSTEADY PO LIQD: 237.0000 mL | ORAL | Status: DC | PRN

## 2014-02-26 MED ORDER — HEPARIN SODIUM (PORCINE) 1000 UNIT/ML DIALYSIS: 1000.0000 [IU] | INTRAMUSCULAR | Status: DC | PRN

## 2014-02-26 MED ORDER — ALTEPLASE 2 MG IJ SOLR: 2.0000 mg | Freq: Once | INTRAMUSCULAR | Status: DC | PRN

## 2014-02-26 MED ORDER — CIPROFLOXACIN HCL 500 MG PO TABS: 500.0000 mg | ORAL_TABLET | Freq: Two times a day (BID) | ORAL | Status: AC

## 2014-02-26 MED ORDER — INSULIN ASPART 100 UNIT/ML ~~LOC~~ SOLN: SUBCUTANEOUS | Status: AC

## 2014-02-26 MED ORDER — LIDOCAINE HCL (PF) 1 % IJ SOLN: 5.0000 mL | INTRAMUSCULAR | Status: DC | PRN

## 2014-02-26 MED ORDER — PENTAFLUOROPROP-TETRAFLUOROETH EX AERO: 1.0000 "application " | INHALATION_SPRAY | CUTANEOUS | Status: DC | PRN

## 2014-02-26 MED ORDER — HEPARIN SODIUM (PORCINE) 1000 UNIT/ML DIALYSIS: 100.0000 [IU]/kg | INTRAMUSCULAR | Status: DC | PRN

## 2014-02-26 MED ORDER — LIDOCAINE-PRILOCAINE 2.5-2.5 % EX CREA: 1.0000 "application " | TOPICAL_CREAM | CUTANEOUS | Status: DC | PRN

## 2014-02-26 MED ORDER — HEPARIN SODIUM (PORCINE) 1000 UNIT/ML DIALYSIS: 40.0000 [IU]/kg | Freq: Once | INTRAMUSCULAR | Status: DC

## 2014-02-26 MED ORDER — OXYCODONE-ACETAMINOPHEN 10-325 MG PO TABS: 1.0000 | ORAL_TABLET | Freq: Four times a day (QID) | ORAL | Status: AC | PRN

## 2014-02-26 MED ORDER — HYDROXYZINE HCL 25 MG PO TABS
25.0000 mg | ORAL_TABLET | Freq: Three times a day (TID) | ORAL | Status: DC | PRN
  Administered 2014-02-26 – 2014-03-25 (×21): 25 mg via ORAL
  Filled 2014-02-26 (×22): qty 1

## 2014-02-26 MED ORDER — DOXYCYCLINE HYCLATE 100 MG PO CAPS: 100.0000 mg | ORAL_CAPSULE | Freq: Two times a day (BID) | ORAL | Status: AC

## 2014-02-26 MED ORDER — ALTEPLASE 2 MG IJ SOLR
2.0000 mg | Freq: Once | INTRAMUSCULAR | Status: DC | PRN
  Filled 2014-02-26: qty 2

## 2014-02-26 NOTE — Progress Notes (Signed)
Patient ID: Douglas [name]  male  JXB:[id number]    DOB: 12-27-1955    DOA: 2014-03-09  PCP: [name], MD   Brief interim history  Patient is a 58 year old male with history of follicular low-grade non-Hodgkin lymphoma, large left groin lymphadenopathy, diagnosed in 2000 and was on recent chemotherapy, had admission for acute renal failure 3/17- 3/20. He was suspected to have acute renal failure de to HCTZ and ARB.  On 4/3 admission, patient was noted to be hyperkalemic 7.6 , hyponatremic and a creatinine of 7.9, uric acid level of 14.3 with LDH 444.  On 3/20 cr was 1.9. He required emergent hemodialysis and was admitted by critical care service. He was transferred from [facility] to [facility] by renal service recommendations.    SIGNIFICANT EVENTS / STUDIES:  4/3 Hyperkalemia, hyperuricemia, hyperphosphatemia, with ARF requiring emergent CRRT. UA with urate crystals. Renal US normal. No pulmonary edema on CXR. Heparin gtt started due to concern for PE. Started IV vanc for left groin wound.  4/4 ARF thought to be due to ATN from severe sepsis, per oncology not tumor lysis syndrome. No DVT, heparin gtt stopped. 2D-echo normal.  4/5 Continue CRRT, to transition to HD, no renal recovery yet. Hyperuricemia resolved.  4/6  . 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 Hulen Skains on 02/24/2014, patient to be discharged with home health and wound VAC, will follow with Dr. Barkley Bruns her in the office post discharge. General surgeon no antibiotics needed from this standpoint.  -Noted wound growing gram-negative rods we'll switch to doxycycline + cipro on 02/25/2014 and monitor temperature curve, leukocytosis improving. Although CBC could be misleading due to underlying history of non-Hodgkin's lymphoma.     Postdialysis tachycardic and hypotensive on 02-24-2014  Likely due to fluid removal -ve 2.5 lis today, normal saline bolus, increase Solu-Cortef which was started in ICU, check lactic acid level, repeat chest x-ray and UA, monitor temperature. And monitor clinically. Currently nontoxic appearing.        Type 2 diabetes mellitus - Continue sliding scale insulin  CBG (last 3)   Recent Labs  02/25/14 2007 02/25/14 2359 02/26/14 0414  GLUCAP 160* 171* 136*      GERD with history of GI bleed: Continue PPI         DVT Prophylaxis: Heparin subcutaneous  Code Status: Full CODE STATUS  Family Communication:  Disposition: Home with wound nursing and with wound VAC  Consultants:  Surgery  Nephrology  Oncology  Procedures:  Dialysis  Wound VAC  Antibiotics:  Antibiotics Given (last 72 hours)   Date/Time Action Medication Dose Rate   02/24/14 1150 Given   vancomycin (VANCOCIN) 1,500 mg in sodium chloride 0.9 % 250 mL IVPB 1,500 mg 250 mL/hr  02/24/14 2200 Given   doxycycline (VIBRAMYCIN) 100 mg in dextrose 5 % 250 mL IVPB 100 mg 125 mL/hr   02/25/14 0604 Given   doxycycline (VIBRAMYCIN) 100 mg in dextrose 5 % 250 mL IVPB 100 mg 125 mL/hr   02/25/14 1235 Given   ciprofloxacin (CIPRO) IVPB 400 mg 400 mg 200 mL/hr   02/25/14 1837 Given   doxycycline  (VIBRAMYCIN) 100 mg in dextrose 5 % 250 mL IVPB 100 mg 125 mL/hr   02/25/14 2154 Given   ciprofloxacin (CIPRO) IVPB 400 mg 400 mg 200 mL/hr   02/26/14 0537 Given   doxycycline (VIBRAMYCIN) 100 mg in dextrose 5 % 250 mL IVPB 100 mg 125 mL/hr      Subjective:  Patient in bed, no fever chills, no headache chest abdominal pain, no shortness of breath, generalized weakness.  Objective: Weight change:   Intake/Output Summary (Last 24 hours) at 02/26/14 1212 Last data filed at 02/26/14 0537  Gross per 24 hour  Intake    930 ml  Output    750 ml  Net    180 ml   Blood pressure 125/65, pulse 103, temperature 98.1 F (36.7 C), temperature source Oral, resp. rate 15, height 5' 10.08" (1.78 m), weight 211.83 kg (467 lb), SpO2 96.00%.  Physical Exam: General: Alert and awake, oriented x3, not in any acute distress. CVS: S1-S2 clear, no murmur rubs or gallops Chest: clear to auscultation bilaterally, no wheezing, rales or rhonchi Abdomen: soft  nondistended, normal bowel sounds  Extremities: no cyanosis, clubbing. 3+ edema noted bilaterally   Lab Results: Basic Metabolic Panel:  Recent Labs Lab 02/22/14 0500  02/25/14 0637 02/26/14 0856  NA 130*  < > 135* 130*  K 4.3  < > 4.2 4.2  CL 88*  < > 91* 89*  CO2 20  < > 19 18*  GLUCOSE 203*  < > 112* 128*  BUN 62*  < > 35* 48*  CREATININE 4.48*  < > 3.87* 5.10*  CALCIUM 8.7  < > 9.2 9.5  MG 2.5  --   --   --   PHOS  --   < > 3.7 4.8*  < > = values in this interval not displayed. Liver Function Tests:  Recent Labs Lab 02/23/14 0647 02/24/14 0545 02/25/14 0637 02/26/14 0856  AST 36 33  --   --   ALT 12 11  --   --   ALKPHOS 87 85  --   --   BILITOT 0.3 0.3  --   --   PROT 7.1 6.8  --   --   ALBUMIN 2.5* 2.4* 2.3* 2.2*   No results found for this basename: LIPASE, AMYLASE,  in the last 168 hours No results found for this basename: AMMONIA,  in the last 168 hours CBC:  Recent Labs Lab 02/26/14 0555 02/26/14 0856  WBC  12.5* 13.5*  NEUTROABS 10.2*  --   HGB 8.2* 8.2*  HCT 24.6* 25.1*  MCV 85.1 86.0  PLT 156 150   Cardiac Enzymes:  Recent Labs Lab 02/18/2014 1220 03/08/2014 1500  CKTOTAL  --  40  CKMB  --  1.4  TROPONINI <0.30  --    BNP: No components found with this basename: POCBNP,  CBG:  Recent Labs Lab 02/25/14 1201 02/25/14 1541 02/25/14 2007 02/25/14 2359 02/26/14 0414  GLUCAP 166* 182* 160* 171* 136*     Micro Results: Recent Results (from the past 240 hour(s))  MRSA PCR SCREENING  Status: Abnormal   Collection Time    03/05/2014  5:27 PM      Result Value Ref Range Status   MRSA by PCR POSITIVE (*) NEGATIVE Final   Comment:            The GeneXpert MRSA Assay (FDA     approved for NASAL specimens     only), is one component of a     comprehensive MRSA colonization     surveillance program. It is not     intended to diagnose MRSA     infection nor to guide or     monitor treatment for     MRSA infections.     RESULT CALLED TO, READ BACK BY AND VERIFIED WITH:     Linda Hedges RN [id number] 03/07/2014 A BROWNING  WOUND CULTURE     Status: None   Collection Time    03/05/2014  5:27 PM      Result Value Ref Range Status   Specimen Description WOUND   Final   Special Requests IMMUNOCOMPROMISED LEFT INGUINAL AREA   Final   Gram Stain     Final   Value: NO WBC SEEN     NO SQUAMOUS EPITHELIAL CELLS SEEN     RARE GRAM NEGATIVE RODS     Performed at [facility]   Culture     Final   Value: MULTIPLE ORGANISMS PRESENT, NONE PREDOMINANT     Note: NO STAPHYLOCOCCUS AUREUS ISOLATED NO GROUP A STREP (S.PYOGENES) ISOLATED     Performed at [facility]   Report Status 02/22/2014 FINAL   Final  CULTURE, BLOOD (ROUTINE X 2)     Status: None   Collection Time    02/21/14  5:31 PM      Result Value Ref Range Status   Specimen Description BLOOD LEFT HAND   Final   Special Requests BOTTLES DRAWN AEROBIC ONLY 8CC   Final   Culture  Setup Time     Final   Value: 02/22/2014  02:22     Performed at [facility]   Culture     Final   Value:        BLOOD CULTURE RECEIVED NO GROWTH TO DATE CULTURE WILL BE HELD FOR 5 DAYS BEFORE ISSUING A FINAL NEGATIVE REPORT     Performed at [facility]   Report Status PENDING   Incomplete  CULTURE, BLOOD (ROUTINE X 2)     Status: None   Collection Time    02/21/14  5:40 PM      Result Value Ref Range Status   Specimen Description BLOOD LEFT HAND   Final   Special Requests BOTTLES DRAWN AEROBIC ONLY 10 CC   Final   Culture  Setup Time     Final   Value: 02/22/2014 02:23     Performed at [facility]   Culture     Final   Value:        BLOOD CULTURE RECEIVED NO GROWTH TO DATE CULTURE WILL BE HELD FOR 5 DAYS BEFORE ISSUING A FINAL NEGATIVE REPORT     Performed at [facility]   Report Status PENDING   Incomplete    Studies/Results: Dg Chest 1 View  02/03/2014   CLINICAL DATA:  Central catheter placement  EXAM: CHEST - 1 VIEW  COMPARISON:  Chest CT December 02, 2013  FINDINGS: Central catheter tip is in the right axillary vein region. There is again noted a 1.7  x 1.3 cm nodular opacity in the right midlung. There is no edema or consolidation. Heart is mildly enlarged with normal pulmonary vascularity. No apparent adenopathy. No pneumothorax. No bone lesions.  IMPRESSION: Central catheter tip in right axillary vein. Nodular opacity right mid lung. No edema or consolidation.   Electronically Signed   By: Lowella Grip M.D.   On: 02/03/2014 07:06   Dg Chest 2 View  02/16/2014   CLINICAL DATA:  2 week history shortness of breath ; the patient has a history of non-Hodgkin's lymphoma  EXAM: CHEST  2 VIEW  COMPARISON:  DG CHEST 1 VIEW dated 02/03/2014  FINDINGS: The lungs are adequately inflated. There is an abnormal soft tissue density nodule that projects in the lower 1/3 of the right hemithorax on the frontal view. It measures 1.4 x 1.9 cm in diameter. It may lie anteriorly in the right middle lobe.  This is similar in appearance to that demonstrated on the previous study. Elsewhere no definite pulmonary nodules are demonstrated. There is no alveolar infiltrate. The cardiac silhouette is normal in size. The pulmonary vascularity is not engorged. There is mild degenerative change of the thoracic spine with calcification of the anterior longitudinal ligament.  IMPRESSION: 1. There is persistent nodularity likely in the right middle lobe. There is no definite evidence of pneumonia. 2. There is no evidence of CHF. 3. Further evaluation of the thorax with chest CT scanning is recommended. This would allow further evaluation of the mediastinum and hilar regions as well as the pulmonary parenchyma given the patient's symptoms of shortness of breath.   Electronically Signed   By: David  Martinique   On: 02/16/2014 10:56   US Renal  02/17/2014   CLINICAL DATA:  Evaluate for hydronephrosis  EXAM: RENAL/URINARY TRACT ULTRASOUND COMPLETE  COMPARISON:  US RENAL dated 02/03/2014; [place] ART/VEN FLOW ABD PELV DOPPLER dated 11/27/2013; US SCROTUM dated 11/27/2013  FINDINGS: Right Kidney:  Length: 11.5 cm. Echogenicity within normal limits. No mass or hydronephrosis visualized.  Left Kidney:  Length: 12.2 cm. Echogenicity within normal limits. No mass or hydronephrosis visualized.  Bladder:  Urinary bladder is decompressed, patient is status post Foley catheter insertion.  IMPRESSION: Negative renal ultrasound.   Electronically Signed   By: Margaree Mackintosh M.D.   On: 02/26/2014 16:09   US Renal  02/03/2014   CLINICAL DATA:  Acute kidney injury.  EXAM: RENAL/URINARY TRACT ULTRASOUND COMPLETE  COMPARISON:  CT ABD/PELVIS W CM dated 10/30/2013  FINDINGS: Right Kidney:  Length: 11.6 cm. Echogenicity within normal limits. No mass or hydronephrosis visualized.  Left Kidney:  Length: 11.8 cm. Echogenicity within normal limits. No mass or hydronephrosis visualized.  Bladder:  Appears normal for degree of bladder distention.  IMPRESSION:  Unremarkable study.   Electronically Signed   By: Rolm Baptise M.D.   On: 02/03/2014 16:56   Dg Chest Port 1 View  02/23/2014   CLINICAL DATA:  Respiratory distress.  EXAM: PORTABLE CHEST - 1 VIEW  COMPARISON:  DG CHEST 1V PORT dated 02/20/2014; CT CHEST W/CM dated 12/02/2013; DG CHEST 1V PORT dated 02/24/2014; DG CHEST 1V PORT dated 02/27/2014  FINDINGS: Cardiomediastinal silhouette remains stable and normal. Dialysis catheter tips SVC RA junction, unchanged. No focal infiltrates, although right lung nodular densities persist. Negative osseous structures.  IMPRESSION: No active infiltrates.   Electronically Signed   By: Rolla Flatten M.D.   On: 02/23/2014 08:10   Dg Chest Port 1 View  02/20/2014   CLINICAL DATA:  Shortness of  breath  EXAM: PORTABLE CHEST - 1 VIEW  COMPARISON:  03/07/2014  FINDINGS: A temporary dialysis catheter is again noted on the right. The cardiac shadow is stable. The lungs are well aerated bilaterally without focal infiltrate or sizable effusion.  IMPRESSION: No acute abnormality noted.   Electronically Signed   By: Inez Catalina M.D.   On: 02/20/2014 08:08   Dg Chest Port 1 View  03/03/2014   CLINICAL DATA:  Status post central line placement  EXAM: PORTABLE CHEST - 1 VIEW  COMPARISON:  02/23/2014 1251 hrs  FINDINGS: The cardiac shadow is stable. A temporary dialysis catheter is now seen in the distal superior vena cava. No pneumothorax is noted. Mild atelectatic changes are noted in the right base. This may be related to the poor inspiratory effort.  IMPRESSION: Status post temporary dialysis catheter placement without pneumothorax.   Electronically Signed   By: Inez Catalina M.D.   On: 03/09/2014 19:31   Dg Chest Portable 1 View  03/16/2014   CLINICAL DATA:  Shortness of breath. Non-Hodgkin's lymphoma. Diabetes and hypertension.  EXAM: PORTABLE CHEST - 1 VIEW  COMPARISON:  DG CHEST 2 VIEW dated 02/16/2014; DG CHEST 1 VIEW dated 02/03/2014; CT CHEST W/CM dated 12/02/2013  FINDINGS: The heart is  mildly enlarged. Right middle lobe nodule appears larger, now measuring 2.7 cm. Probable right upper lobe nodule is only partially seen, measuring approximately 1 cm in diameter radiographically. Lesion was larger based on prior CT evaluation. There is slight prominence the left hilar region but this is felt to be stable compared with prior radiographs and CT. No new consolidation or pleural effusion.  IMPRESSION: Persistent right lung nodules. Right middle lobe nodule is larger ring compared prior studies.   Electronically Signed   By: Shon Hale M.D.   On: 02/21/2014 13:08    Medications: Scheduled Meds: . ciprofloxacin  400 mg Intravenous Q12H  . doxycycline (VIBRAMYCIN) IV  100 mg Intravenous Q12H  . heparin subcutaneous  5,000 Units Subcutaneous 3 times per day  . hydrocortisone sod succinate (SOLU-CORTEF) inj  50 mg Intravenous Q8H  . insulin aspart  0-9 Units Subcutaneous 6 times per day  . multivitamin  1 tablet Oral QHS  . pantoprazole  40 mg Oral Q1200  . sodium chloride  3 mL Intravenous Q12H      LOS: 7 days   Thurnell Lose M.D. [facility] 02/26/2014, 12:12 PM Pager: [phone]  If 7PM-7AM, please contact night-coverage [email or web address] Password [email or web address]

## 2014-02-26 NOTE — Progress Notes (Signed)
Subjective: Interval History: has no complaint, made urine x2 yest.  Objective: Vital signs in last 24 hours: Temp:  [98.1 F (36.7 C)-99.1 F (37.3 C)] 98.1 F (36.7 C) (04/10 0758) Pulse Rate:  [89-108] 90 (04/10 0830) Resp:  [15-23] 15 (04/10 0758) BP: (101-123)/(51-78) 112/61 mmHg (04/10 0830) SpO2:  [96 %-100 %] 96 % (04/10 0758) Weight change:   Intake/Output from previous day: 04/09 0701 - 04/10 0700 In: 1170 [P.O.:720; IV Piggyback:450] Out: 750 [Urine:50; Drains:700] Intake/Output this shift:    General appearance: alert, cooperative and morbidly obese Neck: RIJ cath Resp: diminished breath sounds bilaterally Cardio: S1, S2 normal and systolic murmur: holosystolic 2/6, blowing at apex GI: obese,pos bs,liver down 6 cm Extremities: L groin very swollen,vac.  lichenification of both LE below knee  Lab Results:  Recent Labs  02/25/14 0637 02/26/14 0555  WBC 12.4* 12.5*  HGB 8.6* 8.2*  HCT 26.2* 24.6*  PLT 161 156   BMET:  Recent Labs  02/24/14 0545 02/25/14 0637  NA 132* 135*  K 4.4 4.2  CL 90* 91*  CO2 21 19  GLUCOSE 125* 112*  BUN 51* 35*  CREATININE 5.00* 3.87*  CALCIUM 9.4 9.2   No results found for this basename: PTH,  in the last 72 hours Iron Studies: No results found for this basename: IRON, TIBC, TRANSFERRIN, FERRITIN,  in the last 72 hours  Studies/Results: Dg Chest Port 1 View  02/25/2014   CLINICAL DATA:  Cough and chest pain  EXAM: PORTABLE CHEST - 1 VIEW  COMPARISON:  02/23/2014  FINDINGS: Unchanged positioning of right IJ catheter, tip at the lower SVC. Unchanged heart size and mediastinal contours.  No edema, effusion, consolidation, or pneumothorax. Right-sided pulmonary parenchymal nodularity is again noted, with a dominant 2 cm nodule in the lower right chest, likely representing a middle lobe nodule seen on previous chest CT 12/02/2013. These are stable from recent comparison examinations, but likely progressed from 12/02/2013.   IMPRESSION: 1.  No active disease. 2. Right lung nodularity which is stable compared to recent studies, but enlarged from 12/02/2013 chest CT comparison.   Electronically Signed   By: Jorje Guild M.D.   On: 02/25/2014 01:21    I have reviewed the patient's current medications.  Assessment/Plan: 1  AKI sepis etio vs TLS, no recovery yet but will watch.  If not bettter over weekend, PC 2 NHL 3 Obesity 4 DM controlled 5 Cellulitis/ulcerated L groin on AB,vac 6 Anemia trending down P HD,follow urine vol, mobilize, AB   LOS: 7 days   Douglas [name] 02/26/2014,8:58 AM

## 2014-02-26 NOTE — Clinical Social Work Psychosocial (Signed)
Clinical Social Work Department BRIEF PSYCHOSOCIAL ASSESSMENT 02/26/2014  Patient:  Douglas [name],Douglas [name]     Account Number:  [id number]     Admit date:  03/04/2014  Clinical Social Worker:  Frederico Hamman  Date/Time:  02/26/2014 06:49 AM  Referred by:  Physician  Date Referred:  02/25/2014 Referred for  SNF Placement   Other Referral:   Interview type:  Patient Other interview type:   CSW also talked with patient's girlfriend Donia Guiles who was at the bedside.    PSYCHOSOCIAL DATA Living Status:  ALONE Admitted from facility:   Level of care:   Primary support name:  Donia Guiles Primary support relationship to patient:  FRIEND Degree of support available:   Patient's girlfriend was at the bedside. Patient also has two sisters, Alden Benjamin and Montey Hora.    CURRENT CONCERNS Current Concerns  Post-Acute Placement   Other Concerns:    SOCIAL WORK ASSESSMENT / PLAN CSW talked with patient and friend, Ms. Jerline Pain regarding discharge plans and recommendation of ST rehab. Patient indicated that he does not feel he is ready for discharge and was concerned about a facility being able to monitor his various levels properly. CSW and patient continued to talk and even though he feels he is not yet ready, he knows that he needs rehab before going home.    At approximately 6:50 pm, CSW visited patient and advised patient that he does not have any bed offers from the facilities in [facility]. Patient advised of phone contact made with SNF's in [facility] and information requested and shared. Patient advised that one facility responded regarding patient being on workman's comp and patient clarified that he is not on workman's compensation as he was not injured on the job. [name] reported to CSW that he is on short-term disability from his job and will then be on long-term disability. Patient requested information be sent to facilities in [facility], and  preferences are [facility] and [facility].   Assessment/plan status:  Psychosocial Support/Ongoing Assessment of Needs Other assessment/ plan:   Information/referral to community resources:   Patient given SNF list for [facility]    PATIENT'S/FAMILY'S RESPONSE TO PLAN OF CARE: [name] and his girlfriend were pleasant and open to talking with CSW about discharge planning and ST rehab. Patient is hopeful that he will be accepted by one of his preferred facilities.

## 2014-02-26 NOTE — Discharge Summary (Signed)
Douglas [name], is a 58 y.o. male  DOB Dec 16, 1955  MRN [id number].  Admission date:  03/10/2014  Admitting Physician  Chesley Mires, MD  Discharge Date:  02/26/2014   Primary MD  [name], MD  Recommendations for primary care physician for things to follow:   Repeat cbc, bmp in 1 week   Admission Diagnosis  Hyperkalemia [276.7] SOB (shortness of breath) [786.05] AKI (acute kidney injury) [584.9] Suspected pulmonary embolism [415.19]   Discharge Diagnosis  Hyperkalemia [276.7] SOB (shortness of breath) [786.05] AKI (acute kidney injury) [584.9] Suspected pulmonary embolism [415.19]     Principal Problem:   Acute kidney injury-potentially related to tumor lysis Active Problems:   NHL (non-Hodgkin's lymphoma)   HTN (hypertension)   Dyslipidemia   Anemia of chronic disease   Morbid obesity   Lower GI bleed   Diabetes mellitus   Hyperkalemia   Tumor lysis syndrome, possibility of   Uremia   SOB (shortness of breath) DDX = CHF vs. PE   Acute renal failure   Acidosis      Past Medical History  Diagnosis Date  . Non Hodgkin's lymphoma January 2009  . Diabetes mellitus without complication   . Hypertension   . Sleep apnea   . Lymphadenopathy, inguinal     left    Past Surgical History  Procedure Laterality Date  . Biopsy for lymphoma Left 11/02/13    groin  . Colonoscopy N/A 12/22/2013    Procedure: COLONOSCOPY;  Surgeon: Beryle Beams, MD;  Location: [facility];  Service: Endoscopy;  Laterality: N/A;  . Esophagogastroduodenoscopy N/A 12/22/2013    Procedure: ESOPHAGOGASTRODUODENOSCOPY (EGD);  Surgeon: Beryle Beams, MD;  Location: [facility];  Service: Endoscopy;  Laterality: N/A;  . Ercp N/A 12/24/2013    Procedure: UPPER ENDOSCOPY;  Surgeon: Beryle Beams, MD;  Location: [facility];  Service:  Gastroenterology;  Laterality: N/A;  . Colonoscopy N/A 12/24/2013    Procedure: COLONOSCOPY;  Surgeon: Beryle Beams, MD;  Location: [facility];  Service: Gastroenterology;  Laterality: N/A;     Discharge Condition: stable   Follow UP  Follow-up Information   Follow up with [name], MD. Schedule an appointment as soon as possible for a visit in 1 week.   Specialty:  Family Medicine   Contact information:   [place] [address] [phone]       Follow up with [facility],MD, MD. Schedule an appointment as soon as possible for a visit in 1 week.   Specialty:  General Surgery      Follow up with Eilleen Kempf., MD. Schedule an appointment as soon as possible for a visit in 1 week.   Specialty:  Oncology   Contact information:   [address] [place] [address] [phone]       Follow up with [facility], MD. Schedule an appointment as soon as possible for a visit in 1 week.   Specialty:  Pulmonary Disease   Contact information:   [place]  [place] [address] [phone]         Discharge Instructions  and  Discharge Medications          Discharge Orders   Future Appointments Provider Department Dept Phone   03/03/2014 10:00 AM [facility] [phone]   03/04/2014 10:15 AM [facility] [phone]   Future Orders Complete By Expires   Discharge instructions  As directed    Scheduling Instructions:   Follow with Primary MD [name], MD in 7 days   Get CBC, CMP, checked 7 days by Primary MD and again as instructed by your Primary MD. Get a 2 view Chest X ray done next visit if you had Pneumonia of Lung problems at the [facility] 4 times/day, Once in AM empty stomach and then before each meal. Log in all results and show them to your Prim.MD in 3 days. If any glucose reading is under 80 or above 300  call your Prim MD immidiately. Follow Low glucose instructions for glucose under 80 as instructed.   Activity: As tolerated with Full fall precautions use walker/cane & assistance as needed   Disposition SNF   Diet: Renal-low carbohydrate. Check your Weight same time everyday, if you gain over 2 pounds, or you develop in leg swelling, experience more shortness of breath or chest pain, call your Primary MD immediately. Follow Cardiac Low Salt Diet and 1.8 lit/day fluid restriction.   On your next visit with her primary care physician please Get Medicines reviewed and adjusted.  Please request your Prim.MD to go over all Hospital Tests and Procedure/Radiological results at the follow up, please get all Hospital records sent to your Prim MD by signing hospital release before you go home.   If you experience worsening of your admission symptoms, develop shortness of breath, life threatening emergency, suicidal or homicidal thoughts you must seek medical attention immediately by calling 911 or calling your MD immediately  if symptoms less severe.  You Must read complete instructions/literature along with all the possible adverse reactions/side effects for all the Medicines you take and that have been prescribed to you. Take any new Medicines after you have completely understood and accpet all the possible adverse reactions/side effects.   Do not drive and provide baby sitting services if your were admitted for syncope or siezures until you have seen by Primary MD or a Neurologist and advised to do so again.  Do not drive when taking Pain medications.    Do not take more than prescribed Pain, Sleep and Anxiety Medications  Special Instructions: If you have smoked or chewed Tobacco  in the last 2 yrs please stop smoking, stop any regular Alcohol  and or any Recreational drug use.  Wear Seat belts while driving.   Please note  You were cared for by a hospitalist during your hospital stay.  If you have any questions about your discharge medications or the care you received while you were in the hospital after you are discharged, you can call the unit and asked to speak with the hospitalist on call if the hospitalist that took care of you is not available. Once you are discharged, your primary care physician will handle any further medical issues. Please note that NO REFILLS for any discharge medications will be authorized once you are discharged, as it is imperative that you return to your primary care physician (or establish a relationship with a primary care  physician if you do not have one) for your aftercare needs so that they can reassess your need for medications and monitor your lab values.   Increase activity slowly  As directed        Medication List    STOP taking these medications       atorvastatin 20 MG tablet  Commonly known as:  LIPITOR      TAKE these medications       ciprofloxacin 500 MG tablet  Commonly known as:  CIPRO  Take 1 tablet (500 mg total) by mouth 2 (two) times daily. 1 week     CORTIZONE-10 INTENSIVE HEALING 1 % Crea  Generic drug:  Hydrocortisone-Aloe Vera  Apply 1 application topically as needed (For itching).     docusate sodium 100 MG capsule  Commonly known as:  COLACE  Take 100 mg by mouth 2 (two) times daily.     doxycycline 100 MG capsule  Commonly known as:  VIBRAMYCIN  Take 1 capsule (100 mg total) by mouth 2 (two) times daily. 1 week     insulin aspart 100 UNIT/ML injection  Commonly known as:  NOVOLOG  - Before each meal 3 times a day, 140-199 - 2 units, 200-250 - 4 units, 251-299 - 6 units,  300-349 - 8 units,  350 or above 10 units.  - Dispense syringes and needles as needed, Ok to switch to PEN if approved.     Iron 325 (65 FE) MG Tabs  Take 325 mg by mouth 2 (two) times daily.     omeprazole 40 MG capsule  Commonly known as:  PRILOSEC  Take 1 capsule (40 mg total) by mouth 2 (two) times daily.     OSENI 25-15 MG  Tabs  Generic drug:  Alogliptin-Pioglitazone  Take 1 tablet by mouth daily.     oxyCODONE-acetaminophen 10-325 MG per tablet  Commonly known as:  PERCOCET  Take 1 tablet by mouth every 6 (six) hours as needed for pain.     polyethylene glycol packet  Commonly known as:  MIRALAX / GLYCOLAX  Take 17 g by mouth daily as needed for mild constipation.     prochlorperazine 10 MG tablet  Commonly known as:  COMPAZINE  Take 1 tablet (10 mg total) by mouth every 6 (six) hours as needed for nausea or vomiting.          Diet and Activity recommendation: See Discharge Instructions above   Consults obtained -    Surgery Nephrology Oncology    Major procedures and Radiology Reports - PLEASE review detailed and final reports for all details, in brief -       Dg Chest 1 View  02/03/2014   CLINICAL DATA:  Central catheter placement  EXAM: CHEST - 1 VIEW  COMPARISON:  Chest CT December 02, 2013  FINDINGS: Central catheter tip is in the right axillary vein region. There is again noted a 1.7 x 1.3 cm nodular opacity in the right midlung. There is no edema or consolidation. Heart is mildly enlarged with normal pulmonary vascularity. No apparent adenopathy. No pneumothorax. No bone lesions.  IMPRESSION: Central catheter tip in right axillary vein. Nodular opacity right mid lung. No edema or consolidation.   Electronically Signed   By: Lowella Grip M.D.   On: 02/03/2014 07:06   Dg Chest 2 View  02/16/2014   CLINICAL DATA:  2 week history shortness of breath ; the patient has a history of non-Hodgkin's lymphoma  EXAM: CHEST  2 VIEW  COMPARISON:  DG CHEST 1 VIEW dated 02/03/2014  FINDINGS: The lungs are adequately inflated. There is an abnormal soft tissue density nodule that projects in the lower 1/3 of the right hemithorax on the frontal view. It measures 1.4 x 1.9 cm in diameter. It may lie anteriorly in the right middle lobe. This is similar in appearance to that demonstrated on the previous  study. Elsewhere no definite pulmonary nodules are demonstrated. There is no alveolar infiltrate. The cardiac silhouette is normal in size. The pulmonary vascularity is not engorged. There is mild degenerative change of the thoracic spine with calcification of the anterior longitudinal ligament.  IMPRESSION: 1. There is persistent nodularity likely in the right middle lobe. There is no definite evidence of pneumonia. 2. There is no evidence of CHF. 3. Further evaluation of the thorax with chest CT scanning is recommended. This would allow further evaluation of the mediastinum and hilar regions as well as the pulmonary parenchyma given the patient's symptoms of shortness of breath.   Electronically Signed   By: David  Martinique   On: 02/16/2014 10:56   US Renal  02/23/2014   CLINICAL DATA:  Evaluate for hydronephrosis  EXAM: RENAL/URINARY TRACT ULTRASOUND COMPLETE  COMPARISON:  US RENAL dated 02/03/2014; [place] ART/VEN FLOW ABD PELV DOPPLER dated 11/27/2013; US SCROTUM dated 11/27/2013  FINDINGS: Right Kidney:  Length: 11.5 cm. Echogenicity within normal limits. No mass or hydronephrosis visualized.  Left Kidney:  Length: 12.2 cm. Echogenicity within normal limits. No mass or hydronephrosis visualized.  Bladder:  Urinary bladder is decompressed, patient is status post Foley catheter insertion.  IMPRESSION: Negative renal ultrasound.   Electronically Signed   By: Margaree Mackintosh M.D.   On: 23-Feb-2014 16:09   US Renal  02/03/2014   CLINICAL DATA:  Acute kidney injury.  EXAM: RENAL/URINARY TRACT ULTRASOUND COMPLETE  COMPARISON:  CT ABD/PELVIS W CM dated 10/30/2013  FINDINGS: Right Kidney:  Length: 11.6 cm. Echogenicity within normal limits. No mass or hydronephrosis visualized.  Left Kidney:  Length: 11.8 cm. Echogenicity within normal limits. No mass or hydronephrosis visualized.  Bladder:  Appears normal for degree of bladder distention.  IMPRESSION: Unremarkable study.   Electronically Signed   By: Rolm Baptise M.D.   On:  02/03/2014 16:56   Dg Chest Port 1 View  02/25/2014   CLINICAL DATA:  Cough and chest pain  EXAM: PORTABLE CHEST - 1 VIEW  COMPARISON:  02/23/2014  FINDINGS: Unchanged positioning of right IJ catheter, tip at the lower SVC. Unchanged heart size and mediastinal contours.  No edema, effusion, consolidation, or pneumothorax. Right-sided pulmonary parenchymal nodularity is again noted, with a dominant 2 cm nodule in the lower right chest, likely representing a middle lobe nodule seen on previous chest CT 12/02/2013. These are stable from recent comparison examinations, but likely progressed from 12/02/2013.  IMPRESSION: 1.  No active disease. 2. Right lung nodularity which is stable compared to recent studies, but enlarged from 12/02/2013 chest CT comparison.   Electronically Signed   By: Jorje Guild M.D.   On: 02/25/2014 01:21   Dg Chest Port 1 View  02/23/2014   CLINICAL DATA:  Respiratory distress.  EXAM: PORTABLE CHEST - 1 VIEW  COMPARISON:  DG CHEST 1V PORT dated 02/20/2014; CT CHEST W/CM dated 12/02/2013; DG CHEST 1V PORT dated February 23, 2014; DG CHEST 1V PORT dated 23-Feb-2014  FINDINGS: Cardiomediastinal silhouette remains stable and normal. Dialysis catheter tips SVC RA junction, unchanged. No focal infiltrates, although right lung nodular densities  persist. Negative osseous structures.  IMPRESSION: No active infiltrates.   Electronically Signed   By: Rolla Flatten M.D.   On: 02/23/2014 08:10   Dg Chest Port 1 View  02/20/2014   CLINICAL DATA:  Shortness of breath  EXAM: PORTABLE CHEST - 1 VIEW  COMPARISON:  03/02/2014  FINDINGS: A temporary dialysis catheter is again noted on the right. The cardiac shadow is stable. The lungs are well aerated bilaterally without focal infiltrate or sizable effusion.  IMPRESSION: No acute abnormality noted.   Electronically Signed   By: Inez Catalina M.D.   On: 02/20/2014 08:08   Dg Chest Port 1 View  02/24/2014   CLINICAL DATA:  Status post central line placement  EXAM: PORTABLE  CHEST - 1 VIEW  COMPARISON:  03/02/2014 1251 hrs  FINDINGS: The cardiac shadow is stable. A temporary dialysis catheter is now seen in the distal superior vena cava. No pneumothorax is noted. Mild atelectatic changes are noted in the right base. This may be related to the poor inspiratory effort.  IMPRESSION: Status post temporary dialysis catheter placement without pneumothorax.   Electronically Signed   By: Inez Catalina M.D.   On: 03/16/2014 19:31   Dg Chest Portable 1 View  02/26/2014   CLINICAL DATA:  Shortness of breath. Non-Hodgkin's lymphoma. Diabetes and hypertension.  EXAM: PORTABLE CHEST - 1 VIEW  COMPARISON:  DG CHEST 2 VIEW dated 02/16/2014; DG CHEST 1 VIEW dated 02/03/2014; CT CHEST W/CM dated 12/02/2013  FINDINGS: The heart is mildly enlarged. Right middle lobe nodule appears larger, now measuring 2.7 cm. Probable right upper lobe nodule is only partially seen, measuring approximately 1 cm in diameter radiographically. Lesion was larger based on prior CT evaluation. There is slight prominence the left hilar region but this is felt to be stable compared with prior radiographs and CT. No new consolidation or pleural effusion.  IMPRESSION: Persistent right lung nodules. Right middle lobe nodule is larger ring compared prior studies.   Electronically Signed   By: Shon Hale M.D.   On: 02/25/2014 13:08    Micro Results      Recent Results (from the past 240 hour(s))  MRSA PCR SCREENING     Status: Abnormal   Collection Time    02/26/2014  5:27 PM      Result Value Ref Range Status   MRSA by PCR POSITIVE (*) NEGATIVE Final   Comment:            The GeneXpert MRSA Assay (FDA     approved for NASAL specimens     only), is one component of a     comprehensive MRSA colonization     surveillance program. It is not     intended to diagnose MRSA     infection nor to guide or     monitor treatment for     MRSA infections.     RESULT CALLED TO, READ BACK BY AND VERIFIED WITH:     Linda Hedges RN  2154 03/02/2014 A BROWNING  WOUND CULTURE     Status: None   Collection Time    02/18/2014  5:27 PM      Result Value Ref Range Status   Specimen Description WOUND   Final   Special Requests IMMUNOCOMPROMISED LEFT INGUINAL AREA   Final   Gram Stain     Final   Value: NO WBC SEEN     NO SQUAMOUS EPITHELIAL CELLS SEEN     RARE GRAM NEGATIVE RODS  Performed at [facility]     Final   Value: MULTIPLE ORGANISMS PRESENT, NONE PREDOMINANT     Note: NO STAPHYLOCOCCUS AUREUS ISOLATED NO GROUP A STREP (S.PYOGENES) ISOLATED     Performed at [facility]   Report Status 02/22/2014 FINAL   Final  CULTURE, BLOOD (ROUTINE X 2)     Status: None   Collection Time    02/21/14  5:31 PM      Result Value Ref Range Status   Specimen Description BLOOD LEFT HAND   Final   Special Requests BOTTLES DRAWN AEROBIC ONLY 8CC   Final   Culture  Setup Time     Final   Value: 02/22/2014 02:22     Performed at [facility]   Culture     Final   Value:        BLOOD CULTURE RECEIVED NO GROWTH TO DATE CULTURE WILL BE HELD FOR 5 DAYS BEFORE ISSUING A FINAL NEGATIVE REPORT     Performed at [facility]   Report Status PENDING   Incomplete  CULTURE, BLOOD (ROUTINE X 2)     Status: None   Collection Time    02/21/14  5:40 PM      Result Value Ref Range Status   Specimen Description BLOOD LEFT HAND   Final   Special Requests BOTTLES DRAWN AEROBIC ONLY 10 CC   Final   Culture  Setup Time     Final   Value: 02/22/2014 02:23     Performed at [facility]   Culture     Final   Value:        BLOOD CULTURE RECEIVED NO GROWTH TO DATE CULTURE WILL BE HELD FOR 5 DAYS BEFORE ISSUING A FINAL NEGATIVE REPORT     Performed at [facility]   Report Status PENDING   Incomplete     History of present illness and  Hospital Course:     Kindly see H&P for history of present illness and admission details, please review complete Labs, Consult reports and Test reports for all  details in brief Douglas [name], is a 58 y.o. male, patient with history of   follicular low-grade non-Hodgkin lymphoma, large left groin lymphadenopathy, diagnosed in 2000 and was on recent chemotherapy, had admission for acute renal failure 3/17- 3/20. He was suspected to have acute renal failure de to HCTZ and ARB. On 4/3 admission, patient was noted to be hyperkalemic 7.6 , hyponatremic and a creatinine of 7.9, uric acid level of 14.3 with LDH 444. On 3/20 cr was 1.9. He required emergent hemodialysis and was admitted by critical care service. 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 Hulen Skains on 02/24/2014, patient to be  discharged with home health and wound VAC, will follow with Dr. Barkley Bruns her in the office post discharge. General surgeon no antibiotics needed from this standpoint.   -Noted wound growing gram-negative rods we'll switch to doxycycline + cipro on 02/25/2014 and monitor temperature curve, leukocytosis improving. Although CBC could be misleading due to underlying history of non-Hodgkin's lymphoma.     Postdialysis tachycardic and hypotensive on 02-24-2014  Likely due to fluid removal -ve 2.5 lis today, normal  saline bolus, increase Solu-Cortef which was started in ICU, check lactic acid level, stablerepeat chest x-ray and UA, monitor temperature.  Monitor clinically. Currently nontoxic appearing.     Type 2 diabetes mellitus  - Continue sliding scale insulin  CBG (last 3)   Recent Labs   02/25/14 0110  02/25/14 0411  02/25/14 0758   GLUCAP  128*  136*  139*      GERD with history of GI bleed: Continue PPI     ? Lung Nodule on Last CXR - outpt pulm follow up.     Today   Subjective:   Douglas [name] today has no headache,no chest abdominal pain,no new weakness tingling or numbness, feels much better wants to go SNF today.    Objective:   Blood pressure 125/65, pulse 103, temperature 98.1 F (36.7 C), temperature source Oral, resp. rate 15, height 5' 10.08" (1.78 m), weight 211.83 kg (467 lb), SpO2 96.00%.   Intake/Output Summary (Last 24 hours) at 02/26/14 1205 Last data filed at 02/26/14 0537  Gross per 24 hour  Intake    930 ml  Output    750 ml  Net    180 ml    Exam Awake Alert, Oriented *3, No new F.N deficits, Normal affect [place].AT,PERRAL Supple Neck,No JVD, No cervical lymphadenopathy appriciated.  Symmetrical Chest wall movement, Good air movement bilaterally, CTAB RRR,No Gallops,Rubs or new Murmurs, No Parasternal Heave +ve B.Sounds, Abd Soft, Non tender, No organomegaly appriciated, No rebound -guarding or rigidity. No Cyanosis, Clubbing or edema, No new Rash or bruise  Data Review   CBC w Diff: Lab Results  Component Value Date   WBC 13.5* 02/26/2014   WBC 7.9 01/19/2014   HGB 8.2* 02/26/2014   HGB 8.4* 01/19/2014   HCT 25.1* 02/26/2014   HCT 26.0* 01/19/2014   PLT 150 02/26/2014   PLT 477* 01/19/2014   LYMPHOPCT 8* 02/26/2014   LYMPHOPCT 0.6* 01/19/2014   MONOPCT 10 02/26/2014   MONOPCT 11.6 01/19/2014   EOSPCT 0 02/26/2014   EOSPCT 0.0 01/19/2014   BASOPCT 0 02/26/2014   BASOPCT 0.2 01/19/2014    CMP: Lab Results  Component Value Date   NA 130* 02/26/2014    NA 137 01/19/2014   K 4.2 02/26/2014   K 3.7 01/19/2014   CL 89* 02/26/2014   CO2 18* 02/26/2014   CO2 27 01/19/2014   BUN 48* 02/26/2014   BUN 15.8 01/19/2014   CREATININE 5.10* 02/26/2014   CREATININE 0.9 01/19/2014   PROT 6.8 02/24/2014   PROT 7.3 01/19/2014   ALBUMIN 2.2* 02/26/2014   ALBUMIN 2.0* 01/19/2014   BILITOT 0.3 02/24/2014   BILITOT 0.44 01/19/2014   ALKPHOS 85 02/24/2014   ALKPHOS 186* 01/19/2014   AST 33 02/24/2014   AST 23 01/19/2014   ALT 11 02/24/2014   ALT 15 01/19/2014  .   Total Time in preparing paper work, data evaluation and todays exam - 35 minutes  Thurnell Lose M.D on 02/26/2014 at 12:05 PM  [facility]  Office  [phone]

## 2014-02-26 NOTE — Consult Note (Signed)
WOC wound follow up Wound type: NPWT VAC change today Wound bed: clean, pink, moist, however immediately fills with lymphatic fluid when I removed the packing.  Drainage (amount, consistency, odor) lymphatic fluid, yellow, slight odor Periwound: intact Dressing procedure/placement/frequency: packed the longer tunnel at 9 o'clock with one pc of white foam then filled the smaller tunnel with one pc of white foam.  Filled the remainder of the wound bed with black foam. Seal at 169mmHG. Pt tolerated well. Extra supplies placed in the patients room   WOC will follow along with you for complex VAC changes M/W/F Khan Chura Liane Comber RN,CWOCN [phone]

## 2014-02-26 NOTE — Progress Notes (Signed)
Pt places self on and off CPAP. RT made pt aware that if he needed any help to please call.

## 2014-02-26 NOTE — Procedures (Signed)
I was present at this session.  I have reviewed the session itself and made appropriate changes.  HD via R IJ PC.  bp tol hd, flow ok.    Joyice Faster Nixon Sparr 4/10/20158:57 AM

## 2014-02-26 NOTE — Clinical Social Work Placement (Addendum)
Clinical Social Work Department CLINICAL SOCIAL WORK PLACEMENT NOTE 02/26/2014  Patient:  Douglas [name],Douglas [name]  Account Number:  [id number] Admit date:  03/17/2014  Clinical Social Worker:  Brinda Focht Givens, LCSW  Date/time:  02/26/2014 07:06 AM  Clinical Social Work is seeking post-discharge placement for this patient at the following level of care:   [place]   (*CSW will update this form in [place] as items are completed)   02/26/2014  Patient/family provided with [facility] [facility] Department of Clinical Social Work's list of facilities offering this level of care within the geographic area requested by the patient (or if unable, by the patient's family).  02/26/2014  Patient/family informed of their freedom to choose among providers that offer the needed level of care, that participate in Medicare, Medicaid or managed care program needed by the patient, have an available bed and are willing to accept the patient.    Patient/family informed of [facility]' ownership interest in [facility], as well as of the fact that they are under no obligation to receive care at this facility.  PASARR submitted to EDS on  PASARR number received from EDS on   FL2 transmitted to all facilities in geographic area requested by pt/family on  02/26/2014 FL2 transmitted to all facilities within larger geographic area on 02/26/2014  Patient informed that his/her managed care company has contracts with or will negotiate with  certain facilities, including the following: Patient's commercial insurance - BCBS wil require pre-authorization prior to patient's discharge from hospital.    Patient/family informed of bed offers received: 03/01/14  Patient chooses bed at  Physician recommends and patient chooses bed at    Patient to be transferred to  on   Patient to be transferred to facility by   The following physician request were entered in Epic:  Additional Comments: 02/26/14: Patient does not  want to go to a facility in [place]. Preferences in [facility] are: (1) [facility], (2) [facility] and (3) [facility]. 03/01/14: Patient advised that [facility] is considering and [facility] made a bed offer. CSW will f/u with [facility] to determine if they can take patient. Patient also informed that PT/OT evals needed for insurance authorization. 03/01/14 (2:56 PM): [facility] contacted and said NO to patient. [facility] contacted and Dorian Pod (nurse liasion) inquired about why on contact as they don't have any private rooms. CSW also provided new weight on patient. [facility] Pod will follow-up with facility to make sure they can accommodate patient.    Shelle Iron, LCSW [phone]

## 2014-02-26 NOTE — Discharge Instructions (Signed)
Follow with Primary MD [name], MD in 7 days   Get CBC, CMP, checked 7 days by Primary MD and again as instructed by your Primary MD. Get a 2 view Chest X ray done next visit if you had Pneumonia of Lung problems at the [facility] 4 times/day, Once in AM empty stomach and then before each meal. Log in all results and show them to your Prim.MD in 3 days. If any glucose reading is under 80 or above 300 call your Prim MD immidiately. Follow Low glucose instructions for glucose under 80 as instructed.   Activity: As tolerated with Full fall precautions use walker/cane & assistance as needed   Disposition SNF   Diet: Renal-low carbohydrate. Check your Weight same time everyday, if you gain over 2 pounds, or you develop in leg swelling, experience more shortness of breath or chest pain, call your Primary MD immediately. Follow Cardiac Low Salt Diet and 1.8 lit/day fluid restriction.   On your next visit with her primary care physician please Get Medicines reviewed and adjusted.  Please request your Prim.MD to go over all Hospital Tests and Procedure/Radiological results at the follow up, please get all Hospital records sent to your Prim MD by signing hospital release before you go home.   If you experience worsening of your admission symptoms, develop shortness of breath, life threatening emergency, suicidal or homicidal thoughts you must seek medical attention immediately by calling 911 or calling your MD immediately  if symptoms less severe.  You Must read complete instructions/literature along with all the possible adverse reactions/side effects for all the Medicines you take and that have been prescribed to you. Take any new Medicines after you have completely understood and accpet all the possible adverse reactions/side effects.   Do not drive and provide baby sitting services if your were admitted for syncope or siezures until you have seen by Primary MD or a  Neurologist and advised to do so again.  Do not drive when taking Pain medications.    Do not take more than prescribed Pain, Sleep and Anxiety Medications  Special Instructions: If you have smoked or chewed Tobacco  in the last 2 yrs please stop smoking, stop any regular Alcohol  and or any Recreational drug use.  Wear Seat belts while driving.   Please note  You were cared for by a hospitalist during your hospital stay. If you have any questions about your discharge medications or the care you received while you were in the hospital after you are discharged, you can call the unit and asked to speak with the hospitalist on call if the hospitalist that took care of you is not available. Once you are discharged, your primary care physician will handle any further medical issues. Please note that NO REFILLS for any discharge medications will be authorized once you are discharged, as it is imperative that you return to your primary care physician (or establish a relationship with a primary care physician if you do not have one) for your aftercare needs so that they can reassess your need for medications and monitor your lab values.

## 2014-02-27 LAB — GLUCOSE, CAPILLARY
Glucose-Capillary: 107 mg/dL — ABNORMAL HIGH (ref 70–99)
Glucose-Capillary: 130 mg/dL — ABNORMAL HIGH (ref 70–99)
Glucose-Capillary: 136 mg/dL — ABNORMAL HIGH (ref 70–99)
Glucose-Capillary: 148 mg/dL — ABNORMAL HIGH (ref 70–99)
Glucose-Capillary: 155 mg/dL — ABNORMAL HIGH (ref 70–99)
Glucose-Capillary: 163 mg/dL — ABNORMAL HIGH (ref 70–99)

## 2014-02-27 LAB — CBC WITH DIFFERENTIAL/PLATELET
BASOS ABS: 0 10*3/uL (ref 0.0–0.1)
Basophils Relative: 0 % (ref 0–1)
Eosinophils Absolute: 0 10*3/uL (ref 0.0–0.7)
Eosinophils Relative: 0 % (ref 0–5)
HCT: 24.3 % — ABNORMAL LOW (ref 39.0–52.0)
Hemoglobin: 8.2 g/dL — ABNORMAL LOW (ref 13.0–17.0)
LYMPHS PCT: 9 % — AB (ref 12–46)
Lymphs Abs: 1 10*3/uL (ref 0.7–4.0)
MCH: 28.8 pg (ref 26.0–34.0)
MCHC: 33.7 g/dL (ref 30.0–36.0)
MCV: 85.3 fL (ref 78.0–100.0)
MONOS PCT: 12 % (ref 3–12)
Monocytes Absolute: 1.4 10*3/uL — ABNORMAL HIGH (ref 0.1–1.0)
Neutro Abs: 8.9 10*3/uL — ABNORMAL HIGH (ref 1.7–7.7)
Neutrophils Relative %: 79 % — ABNORMAL HIGH (ref 43–77)
PLATELETS: 161 10*3/uL (ref 150–400)
RBC: 2.85 MIL/uL — ABNORMAL LOW (ref 4.22–5.81)
RDW: 18.2 % — ABNORMAL HIGH (ref 11.5–15.5)
WBC: 11.3 10*3/uL — AB (ref 4.0–10.5)

## 2014-02-27 LAB — URIC ACID: Uric Acid, Serum: 4 mg/dL (ref 4.0–7.8)

## 2014-02-27 MED ORDER — POLYETHYLENE GLYCOL 3350 17 G PO PACK
17.0000 g | PACK | Freq: Every day | ORAL | Status: DC
  Administered 2014-02-27 – 2014-03-26 (×13): 17 g via ORAL
  Filled 2014-02-27 (×28): qty 1

## 2014-02-27 MED ORDER — HYDROCORTISONE NA SUCCINATE PF 100 MG IJ SOLR
50.0000 mg | Freq: Every day | INTRAMUSCULAR | Status: DC
  Administered 2014-02-28 – 2014-03-02 (×3): 50 mg via INTRAVENOUS
  Filled 2014-02-27 (×3): qty 1

## 2014-02-27 MED ORDER — DOCUSATE SODIUM 100 MG PO CAPS
200.0000 mg | ORAL_CAPSULE | Freq: Two times a day (BID) | ORAL | Status: DC
  Administered 2014-02-27 – 2014-03-26 (×51): 200 mg via ORAL
  Filled 2014-02-27 (×60): qty 2

## 2014-02-27 MED ORDER — BISACODYL 10 MG RE SUPP
10.0000 mg | Freq: Every day | RECTAL | Status: DC
  Administered 2014-02-27 – 2014-03-20 (×6): 10 mg via RECTAL
  Filled 2014-02-27 (×10): qty 1

## 2014-02-27 MED ORDER — SODIUM CHLORIDE 0.9 % IJ SOLN
10.0000 mL | INTRAMUSCULAR | Status: DC | PRN
  Administered 2014-02-27 – 2014-03-07 (×7): 10 mL
  Administered 2014-03-25: 20 mL

## 2014-02-27 NOTE — Progress Notes (Signed)
Patient ID: Douglas [name]  male  [id number]    DOB: 1956/01/01    DOA: 03/10/2014  PCP: [name], MD   Brief interim history  Patient is a 58 year old male with history of follicular low-grade non-Hodgkin lymphoma, large left groin lymphadenopathy, diagnosed in 2000 and was on recent chemotherapy, had admission for acute renal failure 3/17- 3/20. He was suspected to have acute renal failure de to HCTZ and ARB.  On 4/3 admission, patient was noted to be hyperkalemic 7.6 , hyponatremic and a creatinine of 7.9, uric acid level of 14.3 with LDH 444.  On 3/20 cr was 1.9. He required emergent hemodialysis and was admitted by critical care service. He was transferred from [facility] to [facility] by renal service recommendations.    SIGNIFICANT EVENTS / STUDIES:  4/3 Hyperkalemia, hyperuricemia, hyperphosphatemia, with ARF requiring emergent CRRT. UA with urate crystals. Renal US normal. No pulmonary edema on CXR. Heparin gtt started due to concern for PE. Started IV vanc for left groin wound.  4/4 ARF thought to be due to ATN from severe sepsis, per oncology not tumor lysis syndrome. No DVT, heparin gtt stopped. 2D-echo normal.  4/5 Continue CRRT, to transition to HD, no renal recovery yet. Hyperuricemia resolved.  4/6  . Wound vac placed 4/7 -hemodialysis initiated     Assessment/Plan:   Principal Problem:    Acute kidney injury-potentially related to tumor lysis, :  - Etiology prerenal from volume loss from her fistula versus severe sepsis from left groin infection.  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 Hulen Skains on 02/24/2014, patient to be discharged with home health and wound VAC, will follow with Dr. Barkley Bruns her in the office post discharge. General surgeon no antibiotics needed from this standpoint.  -Noted wound growing gram-negative rods we'll switch to doxycycline + cipro on 02/25/2014 and monitor temperature curve, leukocytosis improving. Although CBC could be misleading due to underlying history of non-Hodgkin's lymphoma.     Postdialysis tachycardic and hypotensive on 02-24-2014  Likely due to fluid removal during HD, normal saline bolus, taper Solu-Cortef which was started in ICU,  stable repeat chest x-ray and UA, monitor temperature. And monitor clinically. Currently nontoxic appearing.  If needed can use midodrine.      Type 2 diabetes mellitus - Continue sliding scale insulin  CBG (last 3)   Recent Labs  02/27/14 0011 02/27/14 0408 02/27/14 0758  GLUCAP 155* 107* 163*      GERD with history of GI bleed: Continue PPI         DVT Prophylaxis: Heparin subcutaneous  Code Status: Full CODE STATUS  Family Communication:  Disposition: Home with wound nursing and with wound VAC  Consultants:  Surgery  Nephrology  Oncology  Procedures:  Dialysis  Wound VAC  Antibiotics:  Antibiotics Given (last 72 hours)   Date/Time Action Medication Dose Rate   02/24/14 1150 Given   vancomycin (VANCOCIN) 1,500 mg in sodium chloride 0.9 % 250 mL IVPB 1,500 mg 250 mL/hr   02/24/14 2200  Given   doxycycline (VIBRAMYCIN) 100 mg in dextrose 5 % 250 mL IVPB 100 mg 125 mL/hr   02/25/14 0604 Given   doxycycline (VIBRAMYCIN) 100 mg in dextrose 5 % 250 mL IVPB 100 mg 125 mL/hr   02/25/14 1235 Given   ciprofloxacin (CIPRO) IVPB 400 mg 400 mg 200 mL/hr   02/25/14 1837 Given   doxycycline  (VIBRAMYCIN) 100 mg in dextrose 5 % 250 mL IVPB 100 mg 125 mL/hr   02/25/14 2154 Given   ciprofloxacin (CIPRO) IVPB 400 mg 400 mg 200 mL/hr   02/26/14 0537 Given   doxycycline (VIBRAMYCIN) 100 mg in dextrose 5 % 250 mL IVPB 100 mg 125 mL/hr   02/26/14 1341 Given   ciprofloxacin (CIPRO) IVPB 400 mg 400 mg 200 mL/hr   02/26/14 1819 Given   doxycycline (VIBRAMYCIN) 100 mg in dextrose 5 % 250 mL IVPB 100 mg 125 mL/hr   02/26/14 2312 Given   ciprofloxacin (CIPRO) IVPB 400 mg 400 mg 200 mL/hr   02/27/14 0543 Given   doxycycline (VIBRAMYCIN) 100 mg in dextrose 5 % 250 mL IVPB 100 mg 125 mL/hr      Subjective:  Patient in bed, no fever chills, no headache chest abdominal pain, no shortness of breath, generalized weakness.  Objective: Weight change:   Intake/Output Summary (Last 24 hours) at 02/27/14 0907 Last data filed at 02/26/14 1510  Gross per 24 hour  Intake    360 ml  Output   1055 ml  Net   -695 ml   Blood pressure 97/63, pulse 92, temperature 98.3 F (36.8 C), temperature source Oral, resp. rate 18, height 5' 10.08" (1.78 m), weight 206 kg (454 lb 2.4 oz), SpO2 97.00%.  Physical Exam: General: Alert and awake, oriented x3, not in any acute distress. CVS: S1-S2 clear, no murmur rubs or gallops Chest: clear to auscultation bilaterally, no wheezing, rales or rhonchi Abdomen: soft  nondistended, normal bowel sounds  Extremities: no cyanosis, clubbing. 3+ edema noted bilaterally, left groin has a large fluctuant fluid collection with wound VAC drain in place   Lab Results: Basic Metabolic Panel:  Recent Labs Lab 02/22/14 0500  02/25/14 0637 02/26/14 0856  NA 130*  < > 135* 130*  K 4.3  < > 4.2 4.2  CL 88*  < > 91* 89*  CO2 20  < > 19 18*  GLUCOSE 203*  < > 112* 128*  BUN 62*  < > 35* 48*  CREATININE 4.48*  < > 3.87* 5.10*  CALCIUM 8.7  < > 9.2 9.5  MG 2.5  --   --   --   PHOS  --   < > 3.7 4.8*  < > = values in this interval not displayed. Liver Function  Tests:  Recent Labs Lab 02/23/14 0647 02/24/14 0545 02/25/14 0637 02/26/14 0856  AST 36 33  --   --   ALT 12 11  --   --   ALKPHOS 87 85  --   --   BILITOT 0.3 0.3  --   --   PROT 7.1 6.8  --   --   ALBUMIN 2.5* 2.4* 2.3* 2.2*   No results found for this basename: LIPASE, AMYLASE,  in the last 168 hours No results found for this basename: AMMONIA,  in the last 168 hours CBC:  Recent Labs Lab 02/26/14 0856 02/27/14 0622  WBC 13.5* 11.3*  NEUTROABS  --  8.9*  HGB 8.2* 8.2*  HCT 25.1* 24.3*  MCV 86.0  85.3  PLT 150 161   Cardiac Enzymes: No results found for this basename: CKTOTAL, CKMB, CKMBINDEX, TROPONINI,  in the last 168 hours BNP: No components found with this basename: POCBNP,  CBG:  Recent Labs Lab 02/26/14 1644 02/26/14 2009 02/27/14 0011 02/27/14 0408 02/27/14 0758  GLUCAP 176* 237* 155* 107* 163*     Micro Results: Recent Results (from the past 240 hour(s))  MRSA PCR SCREENING     Status: Abnormal   Collection Time    02/18/2014  5:27 PM      Result Value Ref Range Status   MRSA by PCR POSITIVE (*) NEGATIVE Final   Comment:            The GeneXpert MRSA Assay (FDA     approved for NASAL specimens     only), is one component of a     comprehensive MRSA colonization     surveillance program. It is not     intended to diagnose MRSA     infection nor to guide or     monitor treatment for     MRSA infections.     RESULT CALLED TO, READ BACK BY AND VERIFIED WITH:     Linda Hedges RN [id number] 03/16/2014 A BROWNING  WOUND CULTURE     Status: None   Collection Time    03/07/2014  5:27 PM      Result Value Ref Range Status   Specimen Description WOUND   Final   Special Requests IMMUNOCOMPROMISED LEFT INGUINAL AREA   Final   Gram Stain     Final   Value: NO WBC SEEN     NO SQUAMOUS EPITHELIAL CELLS SEEN     RARE GRAM NEGATIVE RODS     Performed at [facility]   Culture     Final   Value: MULTIPLE ORGANISMS PRESENT, NONE PREDOMINANT     Note: NO  STAPHYLOCOCCUS AUREUS ISOLATED NO GROUP A STREP (S.PYOGENES) ISOLATED     Performed at [facility]   Report Status 02/22/2014 FINAL   Final  CULTURE, BLOOD (ROUTINE X 2)     Status: None   Collection Time    02/21/14  5:31 PM      Result Value Ref Range Status   Specimen Description BLOOD LEFT HAND   Final   Special Requests BOTTLES DRAWN AEROBIC ONLY 8CC   Final   Culture  Setup Time     Final   Value: 02/22/2014 02:22     Performed at [facility]   Culture     Final   Value:        BLOOD CULTURE RECEIVED NO GROWTH TO DATE CULTURE WILL BE HELD FOR 5 DAYS BEFORE ISSUING A FINAL NEGATIVE REPORT     Performed at [facility]   Report Status PENDING   Incomplete  CULTURE, BLOOD (ROUTINE X 2)     Status: None   Collection Time    02/21/14  5:40 PM      Result Value Ref Range Status   Specimen Description BLOOD LEFT HAND   Final   Special Requests BOTTLES DRAWN AEROBIC ONLY 10 CC   Final   Culture  Setup Time     Final   Value: 02/22/2014 02:23     Performed at [facility]   Culture     Final   Value:        BLOOD CULTURE RECEIVED NO GROWTH TO DATE CULTURE WILL BE  HELD FOR 5 DAYS BEFORE ISSUING A FINAL NEGATIVE REPORT     Performed at [facility]   Report Status PENDING   Incomplete    Studies/Results: Dg Chest 1 View  02/03/2014   CLINICAL DATA:  Central catheter placement  EXAM: CHEST - 1 VIEW  COMPARISON:  Chest CT December 02, 2013  FINDINGS: Central catheter tip is in the right axillary vein region. There is again noted a 1.7 x 1.3 cm nodular opacity in the right midlung. There is no edema or consolidation. Heart is mildly enlarged with normal pulmonary vascularity. No apparent adenopathy. No pneumothorax. No bone lesions.  IMPRESSION: Central catheter tip in right axillary vein. Nodular opacity right mid lung. No edema or consolidation.   Electronically Signed   By: Lowella Grip M.D.   On: 02/03/2014 07:06   Dg Chest 2  View  02/16/2014   CLINICAL DATA:  2 week history shortness of breath ; the patient has a history of non-Hodgkin's lymphoma  EXAM: CHEST  2 VIEW  COMPARISON:  DG CHEST 1 VIEW dated 02/03/2014  FINDINGS: The lungs are adequately inflated. There is an abnormal soft tissue density nodule that projects in the lower 1/3 of the right hemithorax on the frontal view. It measures 1.4 x 1.9 cm in diameter. It may lie anteriorly in the right middle lobe. This is similar in appearance to that demonstrated on the previous study. Elsewhere no definite pulmonary nodules are demonstrated. There is no alveolar infiltrate. The cardiac silhouette is normal in size. The pulmonary vascularity is not engorged. There is mild degenerative change of the thoracic spine with calcification of the anterior longitudinal ligament.  IMPRESSION: 1. There is persistent nodularity likely in the right middle lobe. There is no definite evidence of pneumonia. 2. There is no evidence of CHF. 3. Further evaluation of the thorax with chest CT scanning is recommended. This would allow further evaluation of the mediastinum and hilar regions as well as the pulmonary parenchyma given the patient's symptoms of shortness of breath.   Electronically Signed   By: David  Martinique   On: 02/16/2014 10:56   US Renal  28-Feb-2014   CLINICAL DATA:  Evaluate for hydronephrosis  EXAM: RENAL/URINARY TRACT ULTRASOUND COMPLETE  COMPARISON:  US RENAL dated 02/03/2014; [place] ART/VEN FLOW ABD PELV DOPPLER dated 11/27/2013; US SCROTUM dated 11/27/2013  FINDINGS: Right Kidney:  Length: 11.5 cm. Echogenicity within normal limits. No mass or hydronephrosis visualized.  Left Kidney:  Length: 12.2 cm. Echogenicity within normal limits. No mass or hydronephrosis visualized.  Bladder:  Urinary bladder is decompressed, patient is status post Foley catheter insertion.  IMPRESSION: Negative renal ultrasound.   Electronically Signed   By: Margaree Mackintosh M.D.   On: 2014/02/28 16:09   US  Renal  02/03/2014   CLINICAL DATA:  Acute kidney injury.  EXAM: RENAL/URINARY TRACT ULTRASOUND COMPLETE  COMPARISON:  CT ABD/PELVIS W CM dated 10/30/2013  FINDINGS: Right Kidney:  Length: 11.6 cm. Echogenicity within normal limits. No mass or hydronephrosis visualized.  Left Kidney:  Length: 11.8 cm. Echogenicity within normal limits. No mass or hydronephrosis visualized.  Bladder:  Appears normal for degree of bladder distention.  IMPRESSION: Unremarkable study.   Electronically Signed   By: Rolm Baptise M.D.   On: 02/03/2014 16:56   Dg Chest Port 1 View  02/23/2014   CLINICAL DATA:  Respiratory distress.  EXAM: PORTABLE CHEST - 1 VIEW  COMPARISON:  DG CHEST 1V PORT dated 02/20/2014; CT CHEST W/CM dated  12/02/2013; DG CHEST 1V PORT dated 03/09/2014; DG CHEST 1V PORT dated 03/05/2014  FINDINGS: Cardiomediastinal silhouette remains stable and normal. Dialysis catheter tips SVC RA junction, unchanged. No focal infiltrates, although right lung nodular densities persist. Negative osseous structures.  IMPRESSION: No active infiltrates.   Electronically Signed   By: Rolla Flatten M.D.   On: 02/23/2014 08:10   Dg Chest Port 1 View  02/20/2014   CLINICAL DATA:  Shortness of breath  EXAM: PORTABLE CHEST - 1 VIEW  COMPARISON:  03/14/2014  FINDINGS: A temporary dialysis catheter is again noted on the right. The cardiac shadow is stable. The lungs are well aerated bilaterally without focal infiltrate or sizable effusion.  IMPRESSION: No acute abnormality noted.   Electronically Signed   By: Inez Catalina M.D.   On: 02/20/2014 08:08   Dg Chest Port 1 View  03/14/2014   CLINICAL DATA:  Status post central line placement  EXAM: PORTABLE CHEST - 1 VIEW  COMPARISON:  03/04/2014 1251 hrs  FINDINGS: The cardiac shadow is stable. A temporary dialysis catheter is now seen in the distal superior vena cava. No pneumothorax is noted. Mild atelectatic changes are noted in the right base. This may be related to the poor inspiratory effort.   IMPRESSION: Status post temporary dialysis catheter placement without pneumothorax.   Electronically Signed   By: Inez Catalina M.D.   On: 02/18/2014 19:31   Dg Chest Portable 1 View  03/03/2014   CLINICAL DATA:  Shortness of breath. Non-Hodgkin's lymphoma. Diabetes and hypertension.  EXAM: PORTABLE CHEST - 1 VIEW  COMPARISON:  DG CHEST 2 VIEW dated 02/16/2014; DG CHEST 1 VIEW dated 02/03/2014; CT CHEST W/CM dated 12/02/2013  FINDINGS: The heart is mildly enlarged. Right middle lobe nodule appears larger, now measuring 2.7 cm. Probable right upper lobe nodule is only partially seen, measuring approximately 1 cm in diameter radiographically. Lesion was larger based on prior CT evaluation. There is slight prominence the left hilar region but this is felt to be stable compared with prior radiographs and CT. No new consolidation or pleural effusion.  IMPRESSION: Persistent right lung nodules. Right middle lobe nodule is larger ring compared prior studies.   Electronically Signed   By: Shon Hale M.D.   On: 03/15/2014 13:08    Medications: Scheduled Meds: . bisacodyl  10 mg Rectal Daily  . ciprofloxacin  400 mg Intravenous Q12H  . docusate sodium  200 mg Oral BID  . doxycycline (VIBRAMYCIN) IV  100 mg Intravenous Q12H  . heparin subcutaneous  5,000 Units Subcutaneous 3 times per day  . hydrocortisone sod succinate (SOLU-CORTEF) inj  50 mg Intravenous Q8H  . insulin aspart  0-9 Units Subcutaneous 6 times per day  . multivitamin  1 tablet Oral QHS  . pantoprazole  40 mg Oral Q1200  . polyethylene glycol  17 g Oral Daily  . sodium chloride  3 mL Intravenous Q12H      LOS: 8 days   Thurnell Lose M.D. [facility] 02/27/2014, 9:07 AM Pager: [phone]  If 7PM-7AM, please contact night-coverage [email or web address] Password [email or web address]

## 2014-02-27 NOTE — Progress Notes (Signed)
Subjective: Interval History: has complaints frustrated at not getting better.  Objective: Vital signs in last 24 hours: Temp:  [98 F (36.7 C)-98.3 F (36.8 C)] 98 F (36.7 C) (04/11 0900) Pulse Rate:  [92-109] 108 (04/11 0900) Resp:  [17-19] 19 (04/11 0900) BP: (97-139)/(51-73) 112/66 mmHg (04/11 0900) SpO2:  [96 %-100 %] 100 % (04/11 0900) Weight:  [201 kg (443 lb 2 oz)-206 kg (454 lb 2.4 oz)] 206 kg (454 lb 2.4 oz) (04/10 2044) Weight change:   Intake/Output from previous day: 04/10 0701 - 04/11 0700 In: 360 [P.O.:360] Out: 1055  Intake/Output this shift: Total I/O In: 480 [P.O.:480] Out: -   General appearance: alert, cooperative and morbidly obese Neck: RIJ cath Resp: diminished breath sounds bilaterally Cardio: S1, S2 normal and systolic murmur: holosystolic 2/6, blowing at apex GI: soft, pos bs, liver down 6 cm, nontender Extremities: edema 1+ R ,2+ L , L groin swollen  Lab Results:  Recent Labs  02/26/14 0856 02/27/14 0622  WBC 13.5* 11.3*  HGB 8.2* 8.2*  HCT 25.1* 24.3*  PLT 150 161   BMET:  Recent Labs  02/25/14 0637 02/26/14 0856  NA 135* 130*  K 4.2 4.2  CL 91* 89*  CO2 19 18*  GLUCOSE 112* 128*  BUN 35* 48*  CREATININE 3.87* 5.10*  CALCIUM 9.2 9.5   No results found for this basename: PTH,  in the last 72 hours Iron Studies: No results found for this basename: IRON, TIBC, TRANSFERRIN, FERRITIN,  in the last 72 hours  Studies/Results: No results found.  I have reviewed the patient's current medications.  Assessment/Plan: 1 AKI ATN from sepsis.  Some urine.  Look at am Cr for recovery.  Mild acidemia 2 Anemia stable 3 NHL 4 DM controllled 5 Obesity 6 Groin cellulits on AB,Vac P follow Cr, urine, Hb, cont AB    LOS: 8 days   Graysen L Markeesha Char 02/27/2014,11:54 AM

## 2014-02-28 LAB — CBC WITH DIFFERENTIAL/PLATELET
BASOS PCT: 0 % (ref 0–1)
Basophils Absolute: 0 10*3/uL (ref 0.0–0.1)
Eosinophils Absolute: 0.1 10*3/uL (ref 0.0–0.7)
Eosinophils Relative: 1 % (ref 0–5)
HCT: 25.2 % — ABNORMAL LOW (ref 39.0–52.0)
HEMOGLOBIN: 8.3 g/dL — AB (ref 13.0–17.0)
LYMPHS ABS: 1.6 10*3/uL (ref 0.7–4.0)
Lymphocytes Relative: 13 % (ref 12–46)
MCH: 28.1 pg (ref 26.0–34.0)
MCHC: 32.9 g/dL (ref 30.0–36.0)
MCV: 85.4 fL (ref 78.0–100.0)
MONO ABS: 1.2 10*3/uL — AB (ref 0.1–1.0)
Monocytes Relative: 10 % (ref 3–12)
NEUTROS PCT: 76 % (ref 43–77)
Neutro Abs: 9.3 10*3/uL — ABNORMAL HIGH (ref 1.7–7.7)
Platelets: 160 10*3/uL (ref 150–400)
RBC: 2.95 MIL/uL — AB (ref 4.22–5.81)
RDW: 17.9 % — ABNORMAL HIGH (ref 11.5–15.5)
WBC: 12.2 10*3/uL — ABNORMAL HIGH (ref 4.0–10.5)

## 2014-02-28 LAB — RENAL FUNCTION PANEL
ALBUMIN: 2.1 g/dL — AB (ref 3.5–5.2)
BUN: 51 mg/dL — AB (ref 6–23)
CHLORIDE: 86 meq/L — AB (ref 96–112)
CO2: 17 mEq/L — ABNORMAL LOW (ref 19–32)
Calcium: 9.4 mg/dL (ref 8.4–10.5)
Creatinine, Ser: 5.24 mg/dL — ABNORMAL HIGH (ref 0.50–1.35)
GFR calc non Af Amer: 11 mL/min — ABNORMAL LOW (ref >90)
GFR, EST AFRICAN AMERICAN: 13 mL/min — AB (ref >90)
Glucose, Bld: 127 mg/dL — ABNORMAL HIGH (ref 70–99)
POTASSIUM: 4.3 meq/L (ref 3.7–5.3)
Phosphorus: 5 mg/dL — ABNORMAL HIGH (ref 2.3–4.6)
SODIUM: 127 meq/L — AB (ref 137–147)

## 2014-02-28 LAB — GLUCOSE, CAPILLARY
GLUCOSE-CAPILLARY: 132 mg/dL — AB (ref 70–99)
GLUCOSE-CAPILLARY: 173 mg/dL — AB (ref 70–99)
Glucose-Capillary: 115 mg/dL — ABNORMAL HIGH (ref 70–99)
Glucose-Capillary: 131 mg/dL — ABNORMAL HIGH (ref 70–99)
Glucose-Capillary: 122 mg/dL — ABNORMAL HIGH (ref 70–99)

## 2014-02-28 LAB — CULTURE, BLOOD (ROUTINE X 2)
Culture: NO GROWTH
Culture: NO GROWTH

## 2014-02-28 LAB — URIC ACID: Uric Acid, Serum: 5.3 mg/dL (ref 4.0–7.8)

## 2014-02-28 MED ORDER — LACTULOSE 10 GM/15ML PO SOLN
30.0000 g | Freq: Two times a day (BID) | ORAL | Status: DC | PRN
  Administered 2014-02-28 – 2014-03-21 (×3): 30 g via ORAL
  Filled 2014-02-28 (×2): qty 45

## 2014-02-28 MED ORDER — SODIUM BICARBONATE 650 MG PO TABS
1300.0000 mg | ORAL_TABLET | Freq: Three times a day (TID) | ORAL | Status: DC
  Administered 2014-02-28 – 2014-03-26 (×74): 1300 mg via ORAL
  Filled 2014-02-28 (×82): qty 2

## 2014-02-28 MED ORDER — SODIUM CHLORIDE 0.9 % IV BOLUS (SEPSIS): 500.0000 mL | INTRAVENOUS | Status: DC | PRN

## 2014-02-28 MED ORDER — FUROSEMIDE 80 MG PO TABS
160.0000 mg | ORAL_TABLET | Freq: Once | ORAL | Status: AC
  Administered 2014-02-28: 160 mg via ORAL
  Filled 2014-02-28: qty 2

## 2014-02-28 MED ORDER — CIPROFLOXACIN HCL 500 MG PO TABS
500.0000 mg | ORAL_TABLET | Freq: Every day | ORAL | Status: DC
  Administered 2014-03-01 – 2014-03-10 (×10): 500 mg via ORAL
  Filled 2014-02-28 (×12): qty 1

## 2014-02-28 NOTE — Progress Notes (Signed)
Subjective: Interval History: has no complaint, but tired.  Objective: Vital signs in last 24 hours: Temp:  [97 F (36.1 C)-98.8 F (37.1 C)] 98.4 F (36.9 C) (04/12 0900) Pulse Rate:  [100-116] 108 (04/12 0900) Resp:  [16-21] 21 (04/12 0900) BP: (92-114)/(51-62) 104/55 mmHg (04/12 0900) SpO2:  [96 %-99 %] 99 % (04/12 0900) Weight change:   Intake/Output from previous day: 04/11 0701 - 04/12 0700 In: 480 [P.O.:480] Out: 900 [Drains:900] Intake/Output this shift: Total I/O In: 120 [P.O.:120] Out: -   General appearance: alert, cooperative and morbidly obese Resp: diminished breath sounds bilaterally Cardio: S1, S2 normal and systolic murmur: holosystolic 2/6, blowing at apex GI: obese, liver down 6 cm, pos bs Extremities: RIJ cath, L leg more swollen than R, severe swelling L groin with Vac  Lab Results:  Recent Labs  02/27/14 0622 02/28/14 0640  WBC 11.3* 12.2*  HGB 8.2* 8.3*  HCT 24.3* 25.2*  PLT 161 160   BMET:  Recent Labs  02/26/14 0856 02/28/14 0640  NA 130* 127*  K 4.2 4.3  CL 89* 86*  CO2 18* 17*  GLUCOSE 128* 127*  BUN 48* 51*  CREATININE 5.10* 5.24*  CALCIUM 9.5 9.4   No results found for this basename: PTH,  in the last 72 hours Iron Studies: No results found for this basename: IRON, TIBC, TRANSFERRIN, FERRITIN,  in the last 72 hours  Studies/Results: No results found.  I have reviewed the patient's current medications.  Assessment/Plan: 1 AKI Cr plateau off HD 2 d.  Hopefully is recovery .  Acidemic, will start bicarb.. Low S Na worrisome as has limited free water clearance. Will give Lasix.  Also limit iv free water and use po Cipro as bioavailability about 95 %. 2 Cellulitis on AB , Vac 3 Morbid obesity 4 Lymphoma 5 Dm stable 6 Anemia stable P po Cipro, po lasix Na bicarb    LOS: 9 days   Douglas [name] 02/28/2014,11:06 AM

## 2014-02-28 NOTE — Progress Notes (Signed)
Patient states that he knows how to place himself on and off the CPAP machine as needed and is aware to call RT if he does need any help with the machine. RT will continue to assist as needed.

## 2014-02-28 NOTE — Progress Notes (Signed)
Patient ID: Douglas [name]  male  [id number]    DOB: 1955-12-15    DOA: 03/09/2014  PCP: [name], MD   Brief interim history   Patient is a 58 year old male with history of follicular low-grade non-Hodgkin lymphoma, large left groin lymphadenopathy, diagnosed in 2000 and was on recent chemotherapy, had admission for acute renal failure 3/17- 3/20. He was suspected to have acute renal failure de to HCTZ and ARB.    On 4/3 admission, patient was noted to be hyperkalemic 7.6 , hyponatremic and a creatinine of 7.9, uric acid level of 14.3 with LDH 444.    On 3/20 cr was 1.9. He required emergent hemodialysis and was admitted by critical care service. He was transferred from [facility] to [facility] by renal service recommendations.     SIGNIFICANT EVENTS / STUDIES:  4/3 Hyperkalemia, hyperuricemia, hyperphosphatemia, with ARF requiring emergent CRRT. UA with urate crystals. Renal US normal. No pulmonary edema on CXR. Heparin gtt started due to concern for PE. Started IV vanc for left groin wound.  4/4 ARF thought to be due to ATN from severe sepsis, per oncology not tumor lysis syndrome. No DVT, heparin gtt stopped. 2D-echo normal.  4/5 Continue CRRT, to transition to HD, no renal recovery yet. Hyperuricemia resolved. Dialysis access is a right IJ dialysis catheter 4/6  . Wound vac placed 4/7 -hemodialysis initiated      Assessment/Plan:   Principal Problem   Acute kidney injury-potentially related to tumor lysis, :  - Etiology prerenal from volume loss from her fistula versus severe sepsis from left groin infection.  Per Oncology tumor lysis syndrome less likely, Currently undergoing dialysis per renal on a 3 times a week basis.       NHL (non-Hodgkin's lymphoma) with follicular low-grade, large left groin lymph adenopathy status post chemotherapy, hyperuricemia  Hyperuricemia has resolved, oncology following (Dr.Magrinat),  stable G6PD, LDH levels  are around 600 which is twice normal, transfuse if hemoglobin less than 7, currently stable.  CT abdomen for restaging outpatient home along with outpatient followup with oncology post discharge.     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 Hulen Skains on 02/24/2014, patient to be discharged with home health and wound VAC, will follow with Dr. Barkley Bruns her in the office post discharge.    -Noted wound growing gram-negative rods we'll switch to doxycycline + cipro on 02/25/2014 and monitor temperature curve, leukocytosis improving. Although CBC could be misleading due to underlying history of non-Hodgkin's lymphoma.     Intermittent  tachycardic and hypotension  Likely due to fluid removal during HD, along with ongoing fluid loss from his left inguinal drain which is draining between 1-1/2-2 L a day, will supplement with IV fluids as needed, have discussed with surgery, they have no other solution then to continue drainage this time. Per Dr. Barkley Bruns and Hulen Skains   no further surgical intervention is required.      Type 2 diabetes mellitus - Continue sliding scale insulin  CBG (last 3)   Recent Labs  02/28/14 0001 02/28/14 0421 02/28/14 0748  GLUCAP 173* 115* 132*      GERD with history of GI bleed: Continue PPI      DVT Prophylaxis: Heparin subcutaneous  Code Status: Full CODE STATUS  Family Communication:  Disposition: TBD    Consultants:  Surgery  Nephrology  Oncology    Procedures:  Dialysis  Wound VAC    Antibiotics:  Antibiotics Given (last 72 hours)   Date/Time Action Medication Dose Rate   02/25/14 1235 Given   ciprofloxacin (CIPRO) IVPB 400 mg 400 mg 200 mL/hr   02/25/14 1837 Given   doxycycline (VIBRAMYCIN) 100 mg in dextrose 5 % 250 mL IVPB 100 mg 125 mL/hr   02/25/14 2154 Given   ciprofloxacin (CIPRO) IVPB 400 mg 400 mg 200 mL/hr   02/26/14 0537 Given    doxycycline (VIBRAMYCIN) 100 mg in dextrose 5 % 250 mL IVPB 100 mg 125 mL/hr   02/26/14 1341 Given   ciprofloxacin (CIPRO) IVPB 400 mg 400 mg 200 mL/hr   02/26/14 1819 Given   doxycycline (VIBRAMYCIN) 100 mg in dextrose 5 % 250 mL IVPB 100 mg 125 mL/hr   02/26/14 2312 Given   ciprofloxacin (CIPRO) IVPB 400 mg 400 mg 200 mL/hr   02/27/14 0543 Given   doxycycline (VIBRAMYCIN) 100 mg in dextrose 5 % 250 mL IVPB 100 mg 125 mL/hr   02/27/14 1124 Given   ciprofloxacin (CIPRO) IVPB 400 mg 400 mg 200 mL/hr   02/27/14 1745 Given   doxycycline (VIBRAMYCIN) 100 mg in dextrose 5 % 250 mL IVPB 100 mg 125 mL/hr   02/27/14 2301 Given   ciprofloxacin (CIPRO) IVPB 400 mg 400 mg 200 mL/hr   02/28/14 0603 Given   doxycycline (VIBRAMYCIN) 100 mg in dextrose 5 % 250 mL IVPB 100 mg 125 mL/hr      Subjective:  Patient in bed, no fever chills, no headache chest abdominal pain, no shortness of breath, generalized weakness.  Objective: Weight change:   Intake/Output Summary (Last 24 hours) at 02/28/14 0845 Last data filed at 02/28/14 0437  Gross per 24 hour  Intake    480 ml  Output    900 ml  Net   -420 ml   Blood pressure 103/57, pulse 116, temperature 98.6 F (37 C), temperature source Oral, resp. rate 20, height 5' 10.08" (1.78 m), weight 206 kg (454 lb 2.4 oz), SpO2 98.00%.  Physical Exam: General: Alert and awake, oriented x3, not in any acute distress. CVS: S1-S2 clear, no murmur rubs or gallops Chest: clear to auscultation bilaterally, no wheezing, rales or rhonchi Abdomen: soft  nondistended, normal bowel sounds  Extremities: no cyanosis, clubbing. 3+ edema noted bilaterally, left groin has a large fluctuant fluid collection with wound VAC drain in place   Lab Results: Basic Metabolic Panel:  Recent Labs Lab 02/22/14 0500  02/26/14 0856 02/28/14 0640  NA 130*  < > 130* 127*  K 4.3  < > 4.2 4.3  CL 88*  < > 89* 86*  CO2 20  < > 18* 17*  GLUCOSE 203*  < > 128* 127*  BUN 62*  <  > 48* 51*  CREATININE 4.48*  < > 5.10* 5.24*  CALCIUM 8.7  < > 9.5 9.4  MG 2.5  --   --   --   PHOS  --   < > 4.8* 5.0*  < > = values in this interval not displayed. Liver Function Tests:  Recent Labs Lab 02/23/14 0647 02/24/14 0545  02/26/14 0856 02/28/14 0640  AST 36 33  --   --   --   ALT 12 11  --   --   --   ALKPHOS 87 85  --   --   --   BILITOT 0.3 0.3  --   --   --   PROT 7.1 6.8  --   --   --  ALBUMIN 2.5* 2.4*  < > 2.2* 2.1*  < > = values in this interval not displayed. No results found for this basename: LIPASE, AMYLASE,  in the last 168 hours No results found for this basename: AMMONIA,  in the last 168 hours CBC:  Recent Labs Lab 02/27/14 0622 02/28/14 0640  WBC 11.3* 12.2*  NEUTROABS 8.9* 9.3*  HGB 8.2* 8.3*  HCT 24.3* 25.2*  MCV 85.3 85.4  PLT 161 160   Cardiac Enzymes: No results found for this basename: CKTOTAL, CKMB, CKMBINDEX, TROPONINI,  in the last 168 hours BNP: No components found with this basename: POCBNP,  CBG:  Recent Labs Lab 02/27/14 1536 02/27/14 2000 02/28/14 0001 02/28/14 0421 02/28/14 0748  GLUCAP 130* 136* 173* 115* 132*     Micro Results: Recent Results (from the past 240 hour(s))  MRSA PCR SCREENING     Status: Abnormal   Collection Time    03/14/2014  5:27 PM      Result Value Ref Range Status   MRSA by PCR POSITIVE (*) NEGATIVE Final   Comment:            The GeneXpert MRSA Assay (FDA     approved for NASAL specimens     only), is one component of a     comprehensive MRSA colonization     surveillance program. It is not     intended to diagnose MRSA     infection nor to guide or     monitor treatment for     MRSA infections.     RESULT CALLED TO, READ BACK BY AND VERIFIED WITH:     Linda Hedges RN [id number] 02/22/2014 A BROWNING  WOUND CULTURE     Status: None   Collection Time    02/20/2014  5:27 PM      Result Value Ref Range Status   Specimen Description WOUND   Final   Special Requests IMMUNOCOMPROMISED LEFT  INGUINAL AREA   Final   Gram Stain     Final   Value: NO WBC SEEN     NO SQUAMOUS EPITHELIAL CELLS SEEN     RARE GRAM NEGATIVE RODS     Performed at [facility]   Culture     Final   Value: MULTIPLE ORGANISMS PRESENT, NONE PREDOMINANT     Note: NO STAPHYLOCOCCUS AUREUS ISOLATED NO GROUP A STREP (S.PYOGENES) ISOLATED     Performed at [facility]   Report Status 02/22/2014 FINAL   Final  CULTURE, BLOOD (ROUTINE X 2)     Status: None   Collection Time    02/21/14  5:31 PM      Result Value Ref Range Status   Specimen Description BLOOD LEFT HAND   Final   Special Requests BOTTLES DRAWN AEROBIC ONLY 8CC   Final   Culture  Setup Time     Final   Value: 02/22/2014 02:22     Performed at [facility]   Culture     Final   Value:        BLOOD CULTURE RECEIVED NO GROWTH TO DATE CULTURE WILL BE HELD FOR 5 DAYS BEFORE ISSUING A FINAL NEGATIVE REPORT     Performed at [facility]   Report Status PENDING   Incomplete  CULTURE, BLOOD (ROUTINE X 2)     Status: None   Collection Time    02/21/14  5:40 PM      Result Value Ref Range Status   Specimen  Description BLOOD LEFT HAND   Final   Special Requests BOTTLES DRAWN AEROBIC ONLY 10 CC   Final   Culture  Setup Time     Final   Value: 02/22/2014 02:23     Performed at [facility]   Culture     Final   Value:        BLOOD CULTURE RECEIVED NO GROWTH TO DATE CULTURE WILL BE HELD FOR 5 DAYS BEFORE ISSUING A FINAL NEGATIVE REPORT     Performed at [facility]   Report Status PENDING   Incomplete    Studies/Results: Dg Chest 1 View  02/03/2014   CLINICAL DATA:  Central catheter placement  EXAM: CHEST - 1 VIEW  COMPARISON:  Chest CT December 02, 2013  FINDINGS: Central catheter tip is in the right axillary vein region. There is again noted a 1.7 x 1.3 cm nodular opacity in the right midlung. There is no edema or consolidation. Heart is mildly enlarged with normal pulmonary vascularity. No apparent  adenopathy. No pneumothorax. No bone lesions.  IMPRESSION: Central catheter tip in right axillary vein. Nodular opacity right mid lung. No edema or consolidation.   Electronically Signed   By: Lowella Grip M.D.   On: 02/03/2014 07:06   Dg Chest 2 View  02/16/2014   CLINICAL DATA:  2 week history shortness of breath ; the patient has a history of non-Hodgkin's lymphoma  EXAM: CHEST  2 VIEW  COMPARISON:  DG CHEST 1 VIEW dated 02/03/2014  FINDINGS: The lungs are adequately inflated. There is an abnormal soft tissue density nodule that projects in the lower 1/3 of the right hemithorax on the frontal view. It measures 1.4 x 1.9 cm in diameter. It may lie anteriorly in the right middle lobe. This is similar in appearance to that demonstrated on the previous study. Elsewhere no definite pulmonary nodules are demonstrated. There is no alveolar infiltrate. The cardiac silhouette is normal in size. The pulmonary vascularity is not engorged. There is mild degenerative change of the thoracic spine with calcification of the anterior longitudinal ligament.  IMPRESSION: 1. There is persistent nodularity likely in the right middle lobe. There is no definite evidence of pneumonia. 2. There is no evidence of CHF. 3. Further evaluation of the thorax with chest CT scanning is recommended. This would allow further evaluation of the mediastinum and hilar regions as well as the pulmonary parenchyma given the patient's symptoms of shortness of breath.   Electronically Signed   By: David  Martinique   On: 02/16/2014 10:56   US Renal  02/22/2014   CLINICAL DATA:  Evaluate for hydronephrosis  EXAM: RENAL/URINARY TRACT ULTRASOUND COMPLETE  COMPARISON:  US RENAL dated 02/03/2014; [place] ART/VEN FLOW ABD PELV DOPPLER dated 11/27/2013; US SCROTUM dated 11/27/2013  FINDINGS: Right Kidney:  Length: 11.5 cm. Echogenicity within normal limits. No mass or hydronephrosis visualized.  Left Kidney:  Length: 12.2 cm. Echogenicity within normal limits. No mass  or hydronephrosis visualized.  Bladder:  Urinary bladder is decompressed, patient is status post Foley catheter insertion.  IMPRESSION: Negative renal ultrasound.   Electronically Signed   By: Margaree Mackintosh M.D.   On: 03/06/2014 16:09   US Renal  02/03/2014   CLINICAL DATA:  Acute kidney injury.  EXAM: RENAL/URINARY TRACT ULTRASOUND COMPLETE  COMPARISON:  CT ABD/PELVIS W CM dated 10/30/2013  FINDINGS: Right Kidney:  Length: 11.6 cm. Echogenicity within normal limits. No mass or hydronephrosis visualized.  Left Kidney:  Length: 11.8 cm. Echogenicity within  normal limits. No mass or hydronephrosis visualized.  Bladder:  Appears normal for degree of bladder distention.  IMPRESSION: Unremarkable study.   Electronically Signed   By: Rolm Baptise M.D.   On: 02/03/2014 16:56   Dg Chest Port 1 View  02/23/2014   CLINICAL DATA:  Respiratory distress.  EXAM: PORTABLE CHEST - 1 VIEW  COMPARISON:  DG CHEST 1V PORT dated 02/20/2014; CT CHEST W/CM dated 12/02/2013; DG CHEST 1V PORT dated Mar 20, 2014; DG CHEST 1V PORT dated Mar 20, 2014  FINDINGS: Cardiomediastinal silhouette remains stable and normal. Dialysis catheter tips SVC RA junction, unchanged. No focal infiltrates, although right lung nodular densities persist. Negative osseous structures.  IMPRESSION: No active infiltrates.   Electronically Signed   By: Rolla Flatten M.D.   On: 02/23/2014 08:10   Dg Chest Port 1 View  02/20/2014   CLINICAL DATA:  Shortness of breath  EXAM: PORTABLE CHEST - 1 VIEW  COMPARISON:  03-20-2014  FINDINGS: A temporary dialysis catheter is again noted on the right. The cardiac shadow is stable. The lungs are well aerated bilaterally without focal infiltrate or sizable effusion.  IMPRESSION: No acute abnormality noted.   Electronically Signed   By: Inez Catalina M.D.   On: 02/20/2014 08:08   Dg Chest Port 1 View  03-20-14   CLINICAL DATA:  Status post central line placement  EXAM: PORTABLE CHEST - 1 VIEW  COMPARISON:  2014/03/20 1251 hrs   FINDINGS: The cardiac shadow is stable. A temporary dialysis catheter is now seen in the distal superior vena cava. No pneumothorax is noted. Mild atelectatic changes are noted in the right base. This may be related to the poor inspiratory effort.  IMPRESSION: Status post temporary dialysis catheter placement without pneumothorax.   Electronically Signed   By: Inez Catalina M.D.   On: 2014/03/20 19:31   Dg Chest Portable 1 View  2014-03-20   CLINICAL DATA:  Shortness of breath. Non-Hodgkin's lymphoma. Diabetes and hypertension.  EXAM: PORTABLE CHEST - 1 VIEW  COMPARISON:  DG CHEST 2 VIEW dated 02/16/2014; DG CHEST 1 VIEW dated 02/03/2014; CT CHEST W/CM dated 12/02/2013  FINDINGS: The heart is mildly enlarged. Right middle lobe nodule appears larger, now measuring 2.7 cm. Probable right upper lobe nodule is only partially seen, measuring approximately 1 cm in diameter radiographically. Lesion was larger based on prior CT evaluation. There is slight prominence the left hilar region but this is felt to be stable compared with prior radiographs and CT. No new consolidation or pleural effusion.  IMPRESSION: Persistent right lung nodules. Right middle lobe nodule is larger ring compared prior studies.   Electronically Signed   By: Shon Hale M.D.   On: 2014-03-20 13:08    Medications: Scheduled Meds: . bisacodyl  10 mg Rectal Daily  . ciprofloxacin  400 mg Intravenous Q12H  . docusate sodium  200 mg Oral BID  . doxycycline (VIBRAMYCIN) IV  100 mg Intravenous Q12H  . heparin subcutaneous  5,000 Units Subcutaneous 3 times per day  . hydrocortisone sod succinate (SOLU-CORTEF) inj  50 mg Intravenous Daily  . insulin aspart  0-9 Units Subcutaneous 6 times per day  . multivitamin  1 tablet Oral QHS  . pantoprazole  40 mg Oral Q1200  . polyethylene glycol  17 g Oral Daily  . sodium chloride  3 mL Intravenous Q12H      LOS: 9 days   Thurnell Lose M.D. [facility] 02/28/2014, 8:45 AM Pager:  [phone]  If 7PM-7AM, please contact  night-coverage [email or web address] Password [email or web address]

## 2014-03-01 LAB — CBC WITH DIFFERENTIAL/PLATELET
Basophils Absolute: 0 10*3/uL (ref 0.0–0.1)
Basophils Relative: 0 % (ref 0–1)
EOS PCT: 1 % (ref 0–5)
Eosinophils Absolute: 0.1 10*3/uL (ref 0.0–0.7)
HCT: 23.5 % — ABNORMAL LOW (ref 39.0–52.0)
HEMOGLOBIN: 8 g/dL — AB (ref 13.0–17.0)
LYMPHS ABS: 1.2 10*3/uL (ref 0.7–4.0)
LYMPHS PCT: 11 % — AB (ref 12–46)
MCH: 28.6 pg (ref 26.0–34.0)
MCHC: 34 g/dL (ref 30.0–36.0)
MCV: 83.9 fL (ref 78.0–100.0)
MONO ABS: 1.2 10*3/uL — AB (ref 0.1–1.0)
MONOS PCT: 12 % (ref 3–12)
Neutro Abs: 8.2 10*3/uL — ABNORMAL HIGH (ref 1.7–7.7)
Neutrophils Relative %: 76 % (ref 43–77)
Platelets: 167 10*3/uL (ref 150–400)
RBC: 2.8 MIL/uL — AB (ref 4.22–5.81)
RDW: 17.8 % — ABNORMAL HIGH (ref 11.5–15.5)
WBC: 10.6 10*3/uL — ABNORMAL HIGH (ref 4.0–10.5)

## 2014-03-01 LAB — GLUCOSE, CAPILLARY
GLUCOSE-CAPILLARY: 121 mg/dL — AB (ref 70–99)
GLUCOSE-CAPILLARY: 122 mg/dL — AB (ref 70–99)
GLUCOSE-CAPILLARY: 137 mg/dL — AB (ref 70–99)
GLUCOSE-CAPILLARY: 144 mg/dL — AB (ref 70–99)
GLUCOSE-CAPILLARY: 154 mg/dL — AB (ref 70–99)
Glucose-Capillary: 124 mg/dL — ABNORMAL HIGH (ref 70–99)
Glucose-Capillary: 127 mg/dL — ABNORMAL HIGH (ref 70–99)

## 2014-03-01 LAB — RENAL FUNCTION PANEL
Albumin: 2.1 g/dL — ABNORMAL LOW (ref 3.5–5.2)
BUN: 66 mg/dL — ABNORMAL HIGH (ref 6–23)
CHLORIDE: 83 meq/L — AB (ref 96–112)
CO2: 17 meq/L — AB (ref 19–32)
CREATININE: 6.26 mg/dL — AB (ref 0.50–1.35)
Calcium: 9.7 mg/dL (ref 8.4–10.5)
GFR calc Af Amer: 10 mL/min — ABNORMAL LOW (ref >90)
GFR calc non Af Amer: 9 mL/min — ABNORMAL LOW (ref >90)
GLUCOSE: 107 mg/dL — AB (ref 70–99)
Phosphorus: 6.6 mg/dL — ABNORMAL HIGH (ref 2.3–4.6)
Potassium: 5 mEq/L (ref 3.7–5.3)
Sodium: 126 mEq/L — ABNORMAL LOW (ref 137–147)

## 2014-03-01 MED ORDER — DOXYCYCLINE HYCLATE 100 MG PO TABS
100.0000 mg | ORAL_TABLET | Freq: Two times a day (BID) | ORAL | Status: DC
  Administered 2014-03-01 – 2014-03-10 (×18): 100 mg via ORAL
  Filled 2014-03-01 (×20): qty 1

## 2014-03-01 NOTE — Consult Note (Signed)
WOC wound follow up Wound type: groin wound, lymphatic leak Measurement: 2cm x 6cm x 12 cm tunnel (at 9 o'clock). Remainder of the wound is 2cm   Wound bed: beefy red, 100% granulation tissue.  Active lymphatic leak at the base of the tunnel, pt fills the wound bed with lymphatic fluid quickly when dressing removed Drainage (amount, consistency, odor) lymphatic fluid, yellow, slight odor Periwound: intact Dressing procedure/placement/frequency:  1pc of white foam placed into the tunneled area and then to the small tunnel at 2 o'clock. Remainder of the wound bed filled with 1pc of black foam, due to the size of the wound at this point decided to bridge the [facility] pad away from the wound. Site protected with drape and bridge to the left thigh. Seal obtained at 125 mmHG. Pt tolerated well.   [facility] team will follow along for VAC dressings. Will need NPWT VAC at dc to [facility] RN,CWOCN [phone]

## 2014-03-01 NOTE — Progress Notes (Signed)
Admit: 03/12/2014 LOS: 10  9M actively treated for NHL with dialysis dependent AKI, likely ATN  Subjective:  NAEON Eating well No SOB Scant urine, occurs when stooling or in bed and not recorded  BUN/SCr worsened in past 24h  04/12 0701 - 04/13 0700 In: 120 [P.O.:120] Out: 700 [Drains:700]  Filed Weights   02/22/14 0404 02/26/14 1244 02/26/14 2044  Weight: 211.83 kg (467 lb) 201 kg (443 lb 2 oz) 206 kg (454 lb 2.4 oz)    Current meds: reviewed  Current Labs: reviewed    Physical Exam:  Blood pressure 121/65, pulse 97, temperature 98.1 F (36.7 C), temperature source Oral, resp. rate 18, height 5' 10.08" (1.78 m), weight 206 kg (454 lb 2.4 oz), SpO2 96.00%. Obese, in bed R IJ temp cath in place  RRR, no rub CTAB on ant auscultation L>R LEE, 2+ Nonfocal AAOx3 EOMI, NCAT  Assessment 1. Dialysis dependent AKI, likely from ATN 2. NHL s/p therapy 02/03/14 3. Nonhealing L necrotic lymph node with wound vac 4. Mild hyponatremia 5. Morbid obesity 6. DM2  Plan 1. HD today, with slower BFR and DFR given mild hyponatremia, goal UF 3L 2. Strict I/Os as best able 3. Cont fluid restriction 4. Daily renal panel, still hopeful for recovery of GFR.   [name] MD 03/01/2014, 1:02 PM   Recent Labs Lab 02/26/14 0856 02/28/14 0640 03/01/14 0601  NA 130* 127* 126*  K 4.2 4.3 5.0  CL 89* 86* 83*  CO2 18* 17* 17*  GLUCOSE 128* 127* 107*  BUN 48* 51* 66*  CREATININE 5.10* 5.24* 6.26*  CALCIUM 9.5 9.4 9.7  PHOS 4.8* 5.0* 6.6*    Recent Labs Lab 02/27/14 0622 02/28/14 0640 03/01/14 0601  WBC 11.3* 12.2* 10.6*  NEUTROABS 8.9* 9.3* 8.2*  HGB 8.2* 8.3* 8.0*  HCT 24.3* 25.2* 23.5*  MCV 85.3 85.4 83.9  PLT 161 160 167

## 2014-03-01 NOTE — Progress Notes (Signed)
Patient ID: Douglas [name]  male  WUJ:[id number]    DOB: 14-Dec-1955    DOA: 03/04/2014  PCP: [name], MD   Brief interim history   Patient is a 58 year old male with history of follicular low-grade non-Hodgkin lymphoma, large left groin lymphadenopathy, diagnosed in 2000 and was on recent chemotherapy, had admission for acute renal failure 3/17- 3/20. He was suspected to have acute renal failure de to HCTZ and ARB.    On 4/3 admission, patient was noted to be hyperkalemic 7.6 , hyponatremic and a creatinine of 7.9, uric acid level of 14.3 with LDH 444.    On 3/20 cr was 1.9. He required emergent hemodialysis and was admitted by critical care service. He was transferred from [facility] to [facility] by renal service recommendations.     SIGNIFICANT EVENTS / STUDIES:  4/3 Hyperkalemia, hyperuricemia, hyperphosphatemia, with ARF requiring emergent CRRT. UA with urate crystals. Renal US normal. No pulmonary edema on CXR. Heparin gtt started due to concern for PE. Started IV vanc for left groin wound.  4/4 ARF thought to be due to ATN from severe sepsis, per oncology not tumor lysis syndrome. No DVT, heparin gtt stopped. 2D-echo normal.  4/5 Continue CRRT, to transition to HD, no renal recovery yet. Hyperuricemia resolved. Dialysis access is a right IJ dialysis catheter 4/6  . Wound vac placed 4/7 -hemodialysis initiated      Assessment/Plan:   Principal Problem   Acute kidney injury-potentially related to tumor lysis, :  - Etiology prerenal from volume loss from her fistula versus severe sepsis from left groin infection.  Per Oncology tumor lysis syndrome less likely, Currently undergoing dialysis per renal on a 3 times a week basis.       NHL (non-Hodgkin's lymphoma) with follicular low-grade, large left groin lymph adenopathy status post chemotherapy, hyperuricemia  Hyperuricemia has resolved, oncology following (Dr.Magrinat),  stable G6PD, LDH levels  are around 600 which is twice normal, transfuse if hemoglobin less than 7, currently stable.  CT abdomen for restaging outpatient home along with outpatient followup with oncology post discharge.     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 Hulen Skains on 02/24/2014, patient to be discharged with home health and wound VAC, will follow with Dr. Barkley Bruns her in the office post discharge.    -Noted wound growing gram-negative rods we'll switch to doxycycline + cipro on 02/25/2014 and monitor temperature curve, leukocytosis improving. Although CBC could be misleading due to underlying history of non-Hodgkin's lymphoma.     Intermittent  tachycardic and hypotension  Likely due to fluid removal during HD, along with ongoing fluid loss from his left inguinal drain which is draining between 1-1/2-2 L a day, will supplement with IV fluids as needed, have discussed with surgery, they have no other solution then to continue drainage this time. Per Dr. Barkley Bruns and Hulen Skains   no further surgical intervention is required.      Type 2 diabetes mellitus - Continue sliding scale insulin  CBG (last 3)   Recent Labs  03/01/14 0002 03/01/14 0411 03/01/14 0759  GLUCAP 121* 137* 124*      GERD with history of GI bleed: Continue PPI      DVT Prophylaxis: Heparin subcutaneous  Code Status: Full CODE STATUS  Family Communication:  Disposition: TBD    Consultants:  Surgery  Nephrology  Oncology    Procedures:  Dialysis  Wound VAC    Antibiotics:  Antibiotics Given (last 72 hours)   Date/Time Action Medication Dose Rate   02/26/14 1341 Given   ciprofloxacin (CIPRO) IVPB 400 mg 400 mg 200 mL/hr   02/26/14 1819 Given   doxycycline (VIBRAMYCIN) 100 mg in dextrose 5 % 250 mL IVPB 100 mg 125 mL/hr   02/26/14 2312 Given   ciprofloxacin (CIPRO) IVPB 400 mg 400 mg 200 mL/hr   02/27/14 0543 Given    doxycycline (VIBRAMYCIN) 100 mg in dextrose 5 % 250 mL IVPB 100 mg 125 mL/hr   02/27/14 1124 Given   ciprofloxacin (CIPRO) IVPB 400 mg 400 mg 200 mL/hr   02/27/14 1745 Given   doxycycline (VIBRAMYCIN) 100 mg in dextrose 5 % 250 mL IVPB 100 mg 125 mL/hr   02/27/14 2301 Given   ciprofloxacin (CIPRO) IVPB 400 mg 400 mg 200 mL/hr   02/28/14 0603 Given   doxycycline (VIBRAMYCIN) 100 mg in dextrose 5 % 250 mL IVPB 100 mg 125 mL/hr   02/28/14 1120 Given   ciprofloxacin (CIPRO) IVPB 400 mg 400 mg 200 mL/hr   02/28/14 1837 Given   doxycycline (VIBRAMYCIN) 100 mg in dextrose 5 % 250 mL IVPB 100 mg 125 mL/hr   03/01/14 0093 Given   doxycycline (VIBRAMYCIN) 100 mg in dextrose 5 % 250 mL IVPB 100 mg 125 mL/hr   03/01/14 8182 Given   ciprofloxacin (CIPRO) tablet 500 mg 500 mg       Subjective:  Patient in bed, no fever chills, no headache chest abdominal pain, no shortness of breath, generalized weakness.  Objective: Weight change:   Intake/Output Summary (Last 24 hours) at 03/01/14 1133 Last data filed at 03/01/14 0900  Gross per 24 hour  Intake    240 ml  Output    700 ml  Net   -460 ml   Blood pressure 121/65, pulse 97, temperature 98.1 F (36.7 C), temperature source Oral, resp. rate 18, height 5' 10.08" (1.78 m), weight 206 kg (454 lb 2.4 oz), SpO2 96.00%.  Physical Exam: General: Alert and awake, oriented x3, not in any acute distress. CVS: S1-S2 clear, no murmur rubs or gallops Chest: clear to auscultation bilaterally, no wheezing, rales or rhonchi Abdomen: soft  nondistended, normal bowel sounds  Extremities: no cyanosis, clubbing. 3+ edema noted bilaterally, left groin has a large fluctuant fluid collection with wound VAC drain in place   Lab Results: Basic Metabolic Panel:  Recent Labs Lab 02/28/14 0640 03/01/14 0601  NA 127* 126*  K 4.3 5.0  CL 86* 83*  CO2 17* 17*  GLUCOSE 127* 107*  BUN 51* 66*  CREATININE 5.24* 6.26*  CALCIUM 9.4 9.7  PHOS 5.0* 6.6*    Liver Function Tests:  Recent Labs Lab 02/23/14 0647 02/24/14 0545  02/28/14 0640 03/01/14 0601  AST 36 33  --   --   --   ALT 12 11  --   --   --   ALKPHOS 87 85  --   --   --   BILITOT 0.3 0.3  --   --   --   PROT 7.1 6.8  --   --   --   ALBUMIN 2.5* 2.4*  < > 2.1* 2.1*  < > = values in this interval not displayed. No results found for this basename: LIPASE, AMYLASE,  in the last 168 hours No results found for this basename: AMMONIA,  in the last 168 hours CBC:  Recent Labs Lab 02/28/14 0640 03/01/14 0601  WBC 12.2* 10.6*  NEUTROABS 9.3*  8.2*  HGB 8.3* 8.0*  HCT 25.2* 23.5*  MCV 85.4 83.9  PLT 160 167   Cardiac Enzymes: No results found for this basename: CKTOTAL, CKMB, CKMBINDEX, TROPONINI,  in the last 168 hours BNP: No components found with this basename: POCBNP,  CBG:  Recent Labs Lab 02/28/14 1642 02/28/14 2013 03/01/14 0002 03/01/14 0411 03/01/14 0759  GLUCAP 122* 154* 121* 137* 124*     Micro Results: Recent Results (from the past 240 hour(s))  MRSA PCR SCREENING     Status: Abnormal   Collection Time    02/17/2014  5:27 PM      Result Value Ref Range Status   MRSA by PCR POSITIVE (*) NEGATIVE Final   Comment:            The GeneXpert MRSA Assay (FDA     approved for NASAL specimens     only), is one component of a     comprehensive MRSA colonization     surveillance program. It is not     intended to diagnose MRSA     infection nor to guide or     monitor treatment for     MRSA infections.     RESULT CALLED TO, READ BACK BY AND VERIFIED WITH:     Linda Hedges RN [id number] 03/12/2014 A BROWNING  WOUND CULTURE     Status: None   Collection Time    02/25/2014  5:27 PM      Result Value Ref Range Status   Specimen Description WOUND   Final   Special Requests IMMUNOCOMPROMISED LEFT INGUINAL AREA   Final   Gram Stain     Final   Value: NO WBC SEEN     NO SQUAMOUS EPITHELIAL CELLS SEEN     RARE GRAM NEGATIVE RODS     Performed at [facility]    Culture     Final   Value: MULTIPLE ORGANISMS PRESENT, NONE PREDOMINANT     Note: NO STAPHYLOCOCCUS AUREUS ISOLATED NO GROUP A STREP (S.PYOGENES) ISOLATED     Performed at [facility]   Report Status 02/22/2014 FINAL   Final  CULTURE, BLOOD (ROUTINE X 2)     Status: None   Collection Time    02/21/14  5:31 PM      Result Value Ref Range Status   Specimen Description BLOOD LEFT HAND   Final   Special Requests BOTTLES DRAWN AEROBIC ONLY [place]   Final   Culture  Setup Time     Final   Value: 02/22/2014 02:22     Performed at [facility]   Culture     Final   Value: NO GROWTH 5 DAYS     Performed at [facility]   Report Status 02/28/2014 FINAL   Final  CULTURE, BLOOD (ROUTINE X 2)     Status: None   Collection Time    02/21/14  5:40 PM      Result Value Ref Range Status   Specimen Description BLOOD LEFT HAND   Final   Special Requests BOTTLES DRAWN AEROBIC ONLY 10 CC   Final   Culture  Setup Time     Final   Value: 02/22/2014 02:23     Performed at [facility]   Culture     Final   Value: NO GROWTH 5 DAYS     Performed at [facility]   Report Status 02/28/2014 FINAL   Final    Studies/Results: Dg  Chest 1 View  02/03/2014   CLINICAL DATA:  Central catheter placement  EXAM: CHEST - 1 VIEW  COMPARISON:  Chest CT December 02, 2013  FINDINGS: Central catheter tip is in the right axillary vein region. There is again noted a 1.7 x 1.3 cm nodular opacity in the right midlung. There is no edema or consolidation. Heart is mildly enlarged with normal pulmonary vascularity. No apparent adenopathy. No pneumothorax. No bone lesions.  IMPRESSION: Central catheter tip in right axillary vein. Nodular opacity right mid lung. No edema or consolidation.   Electronically Signed   By: Lowella Grip M.D.   On: 02/03/2014 07:06   Dg Chest 2 View  02/16/2014   CLINICAL DATA:  2 week history shortness of breath ; the patient has a history of non-Hodgkin's  lymphoma  EXAM: CHEST  2 VIEW  COMPARISON:  DG CHEST 1 VIEW dated 02/03/2014  FINDINGS: The lungs are adequately inflated. There is an abnormal soft tissue density nodule that projects in the lower 1/3 of the right hemithorax on the frontal view. It measures 1.4 x 1.9 cm in diameter. It may lie anteriorly in the right middle lobe. This is similar in appearance to that demonstrated on the previous study. Elsewhere no definite pulmonary nodules are demonstrated. There is no alveolar infiltrate. The cardiac silhouette is normal in size. The pulmonary vascularity is not engorged. There is mild degenerative change of the thoracic spine with calcification of the anterior longitudinal ligament.  IMPRESSION: 1. There is persistent nodularity likely in the right middle lobe. There is no definite evidence of pneumonia. 2. There is no evidence of CHF. 3. Further evaluation of the thorax with chest CT scanning is recommended. This would allow further evaluation of the mediastinum and hilar regions as well as the pulmonary parenchyma given the patient's symptoms of shortness of breath.   Electronically Signed   By: David  Martinique   On: 02/16/2014 10:56   US Renal  03/03/2014   CLINICAL DATA:  Evaluate for hydronephrosis  EXAM: RENAL/URINARY TRACT ULTRASOUND COMPLETE  COMPARISON:  US RENAL dated 02/03/2014; [place] ART/VEN FLOW ABD PELV DOPPLER dated 11/27/2013; US SCROTUM dated 11/27/2013  FINDINGS: Right Kidney:  Length: 11.5 cm. Echogenicity within normal limits. No mass or hydronephrosis visualized.  Left Kidney:  Length: 12.2 cm. Echogenicity within normal limits. No mass or hydronephrosis visualized.  Bladder:  Urinary bladder is decompressed, patient is status post Foley catheter insertion.  IMPRESSION: Negative renal ultrasound.   Electronically Signed   By: Margaree Mackintosh M.D.   On: 03/06/2014 16:09   US Renal  02/03/2014   CLINICAL DATA:  Acute kidney injury.  EXAM: RENAL/URINARY TRACT ULTRASOUND COMPLETE  COMPARISON:  CT  ABD/PELVIS W CM dated 10/30/2013  FINDINGS: Right Kidney:  Length: 11.6 cm. Echogenicity within normal limits. No mass or hydronephrosis visualized.  Left Kidney:  Length: 11.8 cm. Echogenicity within normal limits. No mass or hydronephrosis visualized.  Bladder:  Appears normal for degree of bladder distention.  IMPRESSION: Unremarkable study.   Electronically Signed   By: Rolm Baptise M.D.   On: 02/03/2014 16:56   Dg Chest Port 1 View  02/23/2014   CLINICAL DATA:  Respiratory distress.  EXAM: PORTABLE CHEST - 1 VIEW  COMPARISON:  DG CHEST 1V PORT dated 02/20/2014; CT CHEST W/CM dated 12/02/2013; DG CHEST 1V PORT dated 02/26/2014; DG CHEST 1V PORT dated 02/24/2014  FINDINGS: Cardiomediastinal silhouette remains stable and normal. Dialysis catheter tips SVC RA junction, unchanged. No focal infiltrates, although  right lung nodular densities persist. Negative osseous structures.  IMPRESSION: No active infiltrates.   Electronically Signed   By: Rolla Flatten M.D.   On: 02/23/2014 08:10   Dg Chest Port 1 View  02/20/2014   CLINICAL DATA:  Shortness of breath  EXAM: PORTABLE CHEST - 1 VIEW  COMPARISON:  03/15/2014  FINDINGS: A temporary dialysis catheter is again noted on the right. The cardiac shadow is stable. The lungs are well aerated bilaterally without focal infiltrate or sizable effusion.  IMPRESSION: No acute abnormality noted.   Electronically Signed   By: Inez Catalina M.D.   On: 02/20/2014 08:08   Dg Chest Port 1 View  03/04/2014   CLINICAL DATA:  Status post central line placement  EXAM: PORTABLE CHEST - 1 VIEW  COMPARISON:  03/04/2014 1251 hrs  FINDINGS: The cardiac shadow is stable. A temporary dialysis catheter is now seen in the distal superior vena cava. No pneumothorax is noted. Mild atelectatic changes are noted in the right base. This may be related to the poor inspiratory effort.  IMPRESSION: Status post temporary dialysis catheter placement without pneumothorax.   Electronically Signed   By: Inez Catalina M.D.   On: 03/09/2014 19:31   Dg Chest Portable 1 View  02/18/2014   CLINICAL DATA:  Shortness of breath. Non-Hodgkin's lymphoma. Diabetes and hypertension.  EXAM: PORTABLE CHEST - 1 VIEW  COMPARISON:  DG CHEST 2 VIEW dated 02/16/2014; DG CHEST 1 VIEW dated 02/03/2014; CT CHEST W/CM dated 12/02/2013  FINDINGS: The heart is mildly enlarged. Right middle lobe nodule appears larger, now measuring 2.7 cm. Probable right upper lobe nodule is only partially seen, measuring approximately 1 cm in diameter radiographically. Lesion was larger based on prior CT evaluation. There is slight prominence the left hilar region but this is felt to be stable compared with prior radiographs and CT. No new consolidation or pleural effusion.  IMPRESSION: Persistent right lung nodules. Right middle lobe nodule is larger ring compared prior studies.   Electronically Signed   By: Shon Hale M.D.   On: 03/17/2014 13:08    Medications: Scheduled Meds: . bisacodyl  10 mg Rectal Daily  . ciprofloxacin  500 mg Oral Q breakfast  . docusate sodium  200 mg Oral BID  . doxycycline (VIBRAMYCIN) IV  100 mg Intravenous Q12H  . heparin subcutaneous  5,000 Units Subcutaneous 3 times per day  . hydrocortisone sod succinate (SOLU-CORTEF) inj  50 mg Intravenous Daily  . insulin aspart  0-9 Units Subcutaneous 6 times per day  . multivitamin  1 tablet Oral QHS  . pantoprazole  40 mg Oral Q1200  . polyethylene glycol  17 g Oral Daily  . sodium bicarbonate  1,300 mg Oral TID  . sodium chloride  3 mL Intravenous Q12H      LOS: 10 days   Thurnell Lose M.D. [facility] 03/01/2014, 11:33 AM Pager: [phone]  If 7PM-7AM, please contact night-coverage [email or web address] Password [email or web address]

## 2014-03-02 LAB — CBC WITH DIFFERENTIAL/PLATELET
BASOS ABS: 0 10*3/uL (ref 0.0–0.1)
BASOS PCT: 0 % (ref 0–1)
EOS ABS: 0 10*3/uL (ref 0.0–0.7)
Eosinophils Relative: 0 % (ref 0–5)
HEMATOCRIT: 23.5 % — AB (ref 39.0–52.0)
Hemoglobin: 7.9 g/dL — ABNORMAL LOW (ref 13.0–17.0)
LYMPHS PCT: 11 % — AB (ref 12–46)
Lymphs Abs: 1.1 10*3/uL (ref 0.7–4.0)
MCH: 28.3 pg (ref 26.0–34.0)
MCHC: 33.6 g/dL (ref 30.0–36.0)
MCV: 84.2 fL (ref 78.0–100.0)
Monocytes Absolute: 1 10*3/uL (ref 0.1–1.0)
Monocytes Relative: 10 % (ref 3–12)
Neutro Abs: 8.3 10*3/uL — ABNORMAL HIGH (ref 1.7–7.7)
Neutrophils Relative %: 79 % — ABNORMAL HIGH (ref 43–77)
Platelets: 159 10*3/uL (ref 150–400)
RBC: 2.79 MIL/uL — ABNORMAL LOW (ref 4.22–5.81)
RDW: 18.3 % — AB (ref 11.5–15.5)
WBC: 10.4 10*3/uL (ref 4.0–10.5)

## 2014-03-02 LAB — GLUCOSE, CAPILLARY
GLUCOSE-CAPILLARY: 138 mg/dL — AB (ref 70–99)
GLUCOSE-CAPILLARY: 165 mg/dL — AB (ref 70–99)
GLUCOSE-CAPILLARY: 159 mg/dL — AB (ref 70–99)
GLUCOSE-CAPILLARY: 123 mg/dL — AB (ref 70–99)
Glucose-Capillary: 140 mg/dL — ABNORMAL HIGH (ref 70–99)
Glucose-Capillary: 165 mg/dL — ABNORMAL HIGH (ref 70–99)

## 2014-03-02 MED ORDER — HEPARIN SODIUM (PORCINE) 1000 UNIT/ML DIALYSIS: 20.0000 [IU]/kg | INTRAMUSCULAR | Status: DC | PRN

## 2014-03-02 MED ORDER — HEPARIN SODIUM (PORCINE) 1000 UNIT/ML DIALYSIS
20.0000 [IU]/kg | INTRAMUSCULAR | Status: DC | PRN
  Administered 2014-03-03: 4100 [IU] via INTRAVENOUS_CENTRAL

## 2014-03-02 NOTE — Evaluation (Signed)
Physical Therapy Evaluation Patient Details Name: Douglas [name] MRN: [id number] DOB: [birth year] Today's Date: 03/02/2014   History of Present Illness  58 yo M with pmh of follicular low-grade non-Hodgkin's lymphoma with large left groin lymphadenopathy diagnosed in 2009 with recent chemotherapy and admission for AKI 3/17-3/20 who presented as a transfer from [place] with 1.5 week history of shortness of breath found to be volume overloaded with hyperkalemia and acute renal failure requiring emergent HD.    Clinical Impression  Douglas [name] is functionally limited by lymphedema in his left leg. He may benefit from compression to his thigh for some reduction to improve his ability to move. Recommend compression be started lightly and monitor increase in output in vac canister.  Contacted Dr. Candiss Norse and he agreed; will ask RN to order from [employer] (size is 44.5 inches) Pt will benefit from continued exercise and mobility at SNF for rehab prior to returning home    Follow Up Recommendations SNF    Equipment Recommendations  None recommended by PT    Recommendations for Other Services       Precautions / Restrictions Precautions Precautions: Fall      Mobility  Bed Mobility Overal bed mobility: +2 for physical assistance             General bed mobility comments: pt has diffculty moving LLE. He is able to to use arms and RLE to assist with bed mobility.   Transfers Overall transfer level: Needs assistance               General transfer comment: attempted x2 to get to sitting on edge of bed. He has difficlty flexing left hip enough to get his balance in sitting due to the edema and bed did not go low enough to get his feet the floor.   Ambulation/Gait                Stairs            Wheelchair Mobility    Modified Rankin (Stroke Patients Only)       Balance Overall balance assessment: Needs assistance Sitting-balance support: Feet  unsupported;Bilateral upper extremity supported Sitting balance-Leahy Scale: Poor Sitting balance - Comments: unable to reach floor with feet or sit up straight due to edema Postural control: Right lateral lean                                   Pertinent Vitals/Pain No c/o pain. Pt reports his left leg feels better after exercise    Home Living Family/patient expects to be discharged to:: Private residence Living Arrangements: Non-relatives/Friends Available Help at Discharge: Other (Comment) (girlfriend goes to work at 3 p.m.) Type of Home: House Home Access: Stairs to enter   Technical brewer of Steps: 1 Home Layout: One level [place]: [place] - single point      Prior Function Level of Independence: Independent with assistive device(s)               Hand Dominance   Dominant Hand: Right    Extremity/Trunk Assessment               Lower Extremity Assessment: RLE deficits/detail;LLE deficits/detail RLE Deficits / Details: hemosiderin staining and trophic changes in lower leg Pt is able to move his leg against gravity and use it to push off to assist with bed mobility LLE Deficits / Details: lymphedema in left leg.  greater in thigh and lateral hip likely from lympadenopathy. Pt with vac on left groin draining lyphmpatic fluid.  Skin in firbrotic and firm , but slightly moveable. Edema also in lower leg and foot with hemosiderin staining and fibrotic changes in lower leg. He is able move foot and knee and needs assist to move hip due to weight of the leg..Circumerference aroung thigh at poximal mid thigh skin fold is  44.5 inches     Communication   Communication: No difficulties  Cognition Arousal/Alertness: Awake/alert Behavior During Therapy: WFL for tasks assessed/performed Overall Cognitive Status: Within Functional Limits for tasks assessed                      General Comments      Exercises General Exercises - Lower  Extremity Ankle Circles/Pumps: AROM;Both;20 reps;Supine Hip ABduction/ADduction: AAROM;Left;20 reps;Supine Hip Flexion/Marching: AAROM;Left;20 reps;Supine      Assessment/Plan    PT Assessment Patient needs continued PT services;All further PT needs can be met in the next venue of care  PT Diagnosis Difficulty walking;Generalized weakness   PT Problem List Decreased range of motion;Decreased activity tolerance;Decreased balance;Decreased mobility;Decreased strength;Obesity;Other (comment) (edema)  PT Treatment Interventions DME instruction;Gait training;Functional mobility training;Therapeutic activities;Therapeutic exercise;Balance training;Patient/family education   PT Goals (Current goals can be found in the Care Plan section) Acute Rehab PT Goals Patient Stated Goal: to get moving PT Goal Formulation: With patient Time For Goal Achievement: 03/16/14 Potential to Achieve Goals: Good    Frequency Min 3X/week   Barriers to discharge Decreased caregiver support      Co-evaluation PT/OT/SLP Co-Evaluation/Treatment: Yes Reason for Co-Treatment: Complexity of the patient's impairments (multi-system involvement);For patient/therapist safety PT goals addressed during session: Mobility/safety with mobility;Balance;Strengthening/ROM         End of Session   Activity Tolerance: Patient tolerated treatment well Patient left: in bed;with call bell/phone within reach Nurse Communication: Mobility status         Time: 3007-6226 PT Time Calculation (min): 41 min   Charges:   PT Evaluation $Initial PT Evaluation Tier I: 1 Procedure PT Treatments $Therapeutic Exercise: 8-22 mins $Therapeutic Activity: 8-22 mins   PT G Codes:         Deryl Ports K. [place], [place] 03/02/2014, 1:17 PM

## 2014-03-02 NOTE — Progress Notes (Signed)
Utilization review completed.  

## 2014-03-02 NOTE — Progress Notes (Signed)
Admit: 03/02/2014 LOS: 80  25M actively treated for NHL with dialysis dependent AKI, likely ATN  Subjective:  HD yesterday, uneventful Still no meaningful inc in UOP as best can be told Good PO Afebrile  04/13 0701 - 04/14 0700 In: 600 [P.O.:600] Out: 3454 [Urine:50; Drains:200]  Filed Weights   02/22/14 0404 02/26/14 1244 02/26/14 2044  Weight: 211.83 kg (467 lb) 201 kg (443 lb 2 oz) 206 kg (454 lb 2.4 oz)    Current meds: reviewed  Current Labs: reviewed    Physical Exam:  Blood pressure 112/72, pulse 102, temperature 98 F (36.7 C), temperature source Oral, resp. rate 18, height 5' 10.08" (1.78 m), weight 206 kg (454 lb 2.4 oz), SpO2 98.00%. Obese, in bed R IJ temp cath in place  RRR, no rub CTAB on ant auscultation L>R LEE, 2+ Nonfocal AAOx3 EOMI, NCAT  Assessment 1. Dialysis dependent AKI, likely from ATN 2. NHL s/p therapy 02/03/14 3. Nonhealing L necrotic lymph node with wound vac 4. Mild hyponatremia 5. Morbid obesity 6. DM2  Plan 1. HD tomorrow 4/15 2. Strict I/Os as best able 3. Cont fluid restriction 4. Daily renal panel, still hopeful for recovery of GFR.   [name] MD 03/02/2014, 3:36 PM   Recent Labs Lab 02/26/14 0856 02/28/14 0640 03/01/14 0601  NA 130* 127* 126*  K 4.2 4.3 5.0  CL 89* 86* 83*  CO2 18* 17* 17*  GLUCOSE 128* 127* 107*  BUN 48* 51* 66*  CREATININE 5.10* 5.24* 6.26*  CALCIUM 9.5 9.4 9.7  PHOS 4.8* 5.0* 6.6*    Recent Labs Lab 02/28/14 0640 03/01/14 0601 03/02/14 0535  WBC 12.2* 10.6* 10.4  NEUTROABS 9.3* 8.2* 8.3*  HGB 8.3* 8.0* 7.9*  HCT 25.2* 23.5* 23.5*  MCV 85.4 83.9 84.2  PLT 160 167 159

## 2014-03-02 NOTE — Progress Notes (Signed)
Patient ID: Douglas [name]  male  [id number]    DOB: 1956-10-12    DOA: 03/10/2014  PCP: [name], MD   Brief interim history   Patient is a 58 year old male with history of follicular low-grade non-Hodgkin lymphoma, large left groin lymphadenopathy, diagnosed in 2000 and was on recent chemotherapy, had admission for acute renal failure 3/17- 3/20. He was suspected to have acute renal failure de to HCTZ and ARB.    He had outpatient left-sided inguinal node biopsy after which he has developed a large lymph filled seroma on the left groin requiring wound VAC placement and continuous drainage, he continues to drain about liter liter and a half on a daily basis, there were some superinfection which is responding well to antibiotics which are presently on board. Have discussed his case multiple times with general surgery who do not recommend any further surgical intervention. Once he has a rehabilitation bed he could be placed in a rehabilitation as he requires intermittent IV fluid boluses for ongoing fluid loss due to daily lymph drainage from the wound VAC.     SIGNIFICANT EVENTS / STUDIES:  4/3 Hyperkalemia, hyperuricemia, hyperphosphatemia, with ARF requiring emergent CRRT. UA with urate crystals. Renal US normal. No pulmonary edema on CXR. Heparin gtt started due to concern for PE. Started IV vanc for left groin wound.  4/4 ARF thought to be due to ATN from severe sepsis, per oncology not tumor lysis syndrome. No DVT, heparin gtt stopped. 2D-echo normal.  4/5 Continue CRRT, to transition to HD, no renal recovery yet. Hyperuricemia resolved. Dialysis access is a right IJ dialysis catheter 4/6  .Wound vac placed 4/7 -hemodialysis initiated      Assessment/Plan:   Principal Problem   Acute kidney injury-potentially related to tumor lysis, :  - Etiology prerenal from volume loss from her fistula versus severe sepsis from left groin infection.  Per Oncology tumor lysis  syndrome less likely, Currently undergoing dialysis per renal on a 3 times a week basis.       NHL (non-Hodgkin's lymphoma) with follicular low-grade, large left groin lymph adenopathy status post chemotherapy, hyperuricemia  Hyperuricemia has resolved, oncology following (Dr.Magrinat),  stable G6PD, LDH levels are around 600 which is twice normal, transfuse if hemoglobin less than 7, currently stable.  CT abdomen for restaging outpatient home along with outpatient followup with oncology post discharge.     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 Hulen Skains on 02/24/2014, patient to be discharged with home health and wound VAC, will follow with Dr. Barkley Bruns in the office post discharge.    -Noted wound growing gram-negative rods we'll switch to doxycycline + cipro on 02/25/2014 and monitor temperature curve, leukocytosis improving. Responding well to antibiotics continue for a total of 10 days stop date 03/07/2014.     Intermittent  tachycardic and hypotension  Likely due to fluid removal during HD, along with ongoing fluid loss from his left inguinal drain which is draining between 1-1/2-2 L a day, will supplement with IV fluids as needed, have discussed with surgery, they have no other solution then to continue drainage this time. Per Dr. Barkley Bruns and Hulen Skains   no further surgical intervention is required.   He was on steroids for hypotension while in the ICU which have been tapered down last it will be today.      Type 2 diabetes mellitus - Continue sliding scale insulin  CBG (last 3)  Recent Labs  03/02/14 0412 03/02/14 0759 03/02/14 1218  GLUCAP 140* 138* 165*      GERD with history of GI bleed: Continue PPI      DVT Prophylaxis: Heparin subcutaneous   Code Status: Full CODE STATUS  Family Communication:  Disposition:  Rehabilitation    Consultants:  Surgery  Nephrology  Oncology    Procedures:  Dialysis  Wound VAC    Antibiotics:  Antibiotics Given (last 72 hours)   Date/Time Action Medication Dose Rate   02/27/14 1745 Given   doxycycline (VIBRAMYCIN) 100 mg in dextrose 5 % 250 mL IVPB 100 mg 125 mL/hr   02/27/14 2301 Given   ciprofloxacin (CIPRO) IVPB 400 mg 400 mg 200 mL/hr   02/28/14 0603 Given   doxycycline (VIBRAMYCIN) 100 mg in dextrose 5 % 250 mL IVPB 100 mg 125 mL/hr   02/28/14 1120 Given   ciprofloxacin (CIPRO) IVPB 400 mg 400 mg 200 mL/hr   02/28/14 1837 Given   doxycycline (VIBRAMYCIN) 100 mg in dextrose 5 % 250 mL IVPB 100 mg 125 mL/hr   03/01/14 [id number] Given   doxycycline (VIBRAMYCIN) 100 mg in dextrose 5 % 250 mL IVPB 100 mg 125 mL/hr   03/01/14 [id number] Given   ciprofloxacin (CIPRO) tablet 500 mg 500 mg    03/01/14 2335 Given   doxycycline (VIBRA-TABS) tablet 100 mg 100 mg    03/02/14 0542 Given   doxycycline (VIBRA-TABS) tablet 100 mg 100 mg    03/02/14 0915 Given   ciprofloxacin (CIPRO) tablet 500 mg 500 mg       Subjective:  Patient in bed, no fever chills, no headache chest abdominal pain, no shortness of breath, generalized weakness.  Objective: Weight change:   Intake/Output Summary (Last 24 hours) at 03/02/14 1255 Last data filed at 03/02/14 1010  Gross per 24 hour  Intake    840 ml  Output   3534 ml  Net  -2694 ml   Blood pressure 121/70, pulse 101, temperature 98.6 F (37 C), temperature source Oral, resp. rate 18, height 5' 10.08" (1.78 m), weight 206 kg (454 lb 2.4 oz), SpO2 98.00%.  Physical Exam: General: Alert and awake, oriented x3, not in any acute distress. CVS: S1-S2 clear, no murmur rubs or gallops Chest: clear to auscultation bilaterally, no wheezing, rales or rhonchi Abdomen: soft  nondistended, normal bowel sounds  Extremities: no cyanosis, clubbing. 3+ edema noted bilaterally, left groin has a large fluctuant fluid collection  with wound VAC drain in place   Lab Results: Basic Metabolic Panel:  Recent Labs Lab 02/28/14 0640 03/01/14 0601  NA 127* 126*  K 4.3 5.0  CL 86* 83*  CO2 17* 17*  GLUCOSE 127* 107*  BUN 51* 66*  CREATININE 5.24* 6.26*  CALCIUM 9.4 9.7  PHOS 5.0* 6.6*   Liver Function Tests:  Recent Labs Lab 02/24/14 0545  02/28/14 0640 03/01/14 0601  AST 33  --   --   --   ALT 11  --   --   --   ALKPHOS 85  --   --   --   BILITOT 0.3  --   --   --   PROT 6.8  --   --   --   ALBUMIN 2.4*  < > 2.1* 2.1*  < > = values in this interval not displayed. No results found for this basename: LIPASE, AMYLASE,  in the last 168 hours No results found for this basename: AMMONIA,  in the last 168 hours  CBC:  Recent Labs Lab 03/01/14 0601 03/02/14 0535  WBC 10.6* 10.4  NEUTROABS 8.2* 8.3*  HGB 8.0* 7.9*  HCT 23.5* 23.5*  MCV 83.9 84.2  PLT 167 159   Cardiac Enzymes: No results found for this basename: CKTOTAL, CKMB, CKMBINDEX, TROPONINI,  in the last 168 hours BNP: No components found with this basename: POCBNP,  CBG:  Recent Labs Lab 03/01/14 2048 03/02/14 0009 03/02/14 0412 03/02/14 0759 03/02/14 1218  GLUCAP 122* 165* 140* 138* 165*     Micro Results: Recent Results (from the past 240 hour(s))  CULTURE, BLOOD (ROUTINE X 2)     Status: None   Collection Time    02/21/14  5:31 PM      Result Value Ref Range Status   Specimen Description BLOOD LEFT HAND   Final   Special Requests BOTTLES DRAWN AEROBIC ONLY 8CC   Final   Culture  Setup Time     Final   Value: 02/22/2014 02:22     Performed at [facility]   Culture     Final   Value: NO GROWTH 5 DAYS     Performed at [facility]   Report Status 02/28/2014 FINAL   Final  CULTURE, BLOOD (ROUTINE X 2)     Status: None   Collection Time    02/21/14  5:40 PM      Result Value Ref Range Status   Specimen Description BLOOD LEFT HAND   Final   Special Requests BOTTLES DRAWN AEROBIC ONLY 10 CC   Final    Culture  Setup Time     Final   Value: 02/22/2014 02:23     Performed at [facility]   Culture     Final   Value: NO GROWTH 5 DAYS     Performed at [facility]   Report Status 02/28/2014 FINAL   Final    Studies/Results: Dg Chest 1 View  02/03/2014   CLINICAL DATA:  Central catheter placement  EXAM: CHEST - 1 VIEW  COMPARISON:  Chest CT December 02, 2013  FINDINGS: Central catheter tip is in the right axillary vein region. There is again noted a 1.7 x 1.3 cm nodular opacity in the right midlung. There is no edema or consolidation. Heart is mildly enlarged with normal pulmonary vascularity. No apparent adenopathy. No pneumothorax. No bone lesions.  IMPRESSION: Central catheter tip in right axillary vein. Nodular opacity right mid lung. No edema or consolidation.   Electronically Signed   By: Lowella Grip M.D.   On: 02/03/2014 07:06   Dg Chest 2 View  02/16/2014   CLINICAL DATA:  2 week history shortness of breath ; the patient has a history of non-Hodgkin's lymphoma  EXAM: CHEST  2 VIEW  COMPARISON:  DG CHEST 1 VIEW dated 02/03/2014  FINDINGS: The lungs are adequately inflated. There is an abnormal soft tissue density nodule that projects in the lower 1/3 of the right hemithorax on the frontal view. It measures 1.4 x 1.9 cm in diameter. It may lie anteriorly in the right middle lobe. This is similar in appearance to that demonstrated on the previous study. Elsewhere no definite pulmonary nodules are demonstrated. There is no alveolar infiltrate. The cardiac silhouette is normal in size. The pulmonary vascularity is not engorged. There is mild degenerative change of the thoracic spine with calcification of the anterior longitudinal ligament.  IMPRESSION: 1. There is persistent nodularity likely in the right middle lobe. There is no definite evidence  of pneumonia. 2. There is no evidence of CHF. 3. Further evaluation of the thorax with chest CT scanning is recommended. This would  allow further evaluation of the mediastinum and hilar regions as well as the pulmonary parenchyma given the patient's symptoms of shortness of breath.   Electronically Signed   By: David  Martinique   On: 02/16/2014 10:56   US Renal  02/23/2014   CLINICAL DATA:  Evaluate for hydronephrosis  EXAM: RENAL/URINARY TRACT ULTRASOUND COMPLETE  COMPARISON:  US RENAL dated 02/03/2014; [place] ART/VEN FLOW ABD PELV DOPPLER dated 11/27/2013; US SCROTUM dated 11/27/2013  FINDINGS: Right Kidney:  Length: 11.5 cm. Echogenicity within normal limits. No mass or hydronephrosis visualized.  Left Kidney:  Length: 12.2 cm. Echogenicity within normal limits. No mass or hydronephrosis visualized.  Bladder:  Urinary bladder is decompressed, patient is status post Foley catheter insertion.  IMPRESSION: Negative renal ultrasound.   Electronically Signed   By: Margaree Mackintosh M.D.   On: 02/25/2014 16:09   US Renal  02/03/2014   CLINICAL DATA:  Acute kidney injury.  EXAM: RENAL/URINARY TRACT ULTRASOUND COMPLETE  COMPARISON:  CT ABD/PELVIS W CM dated 10/30/2013  FINDINGS: Right Kidney:  Length: 11.6 cm. Echogenicity within normal limits. No mass or hydronephrosis visualized.  Left Kidney:  Length: 11.8 cm. Echogenicity within normal limits. No mass or hydronephrosis visualized.  Bladder:  Appears normal for degree of bladder distention.  IMPRESSION: Unremarkable study.   Electronically Signed   By: Rolm Baptise M.D.   On: 02/03/2014 16:56   Dg Chest Port 1 View  02/23/2014   CLINICAL DATA:  Respiratory distress.  EXAM: PORTABLE CHEST - 1 VIEW  COMPARISON:  DG CHEST 1V PORT dated 02/20/2014; CT CHEST W/CM dated 12/02/2013; DG CHEST 1V PORT dated 03/18/2014; DG CHEST 1V PORT dated 03/16/2014  FINDINGS: Cardiomediastinal silhouette remains stable and normal. Dialysis catheter tips SVC RA junction, unchanged. No focal infiltrates, although right lung nodular densities persist. Negative osseous structures.  IMPRESSION: No active infiltrates.   Electronically  Signed   By: Rolla Flatten M.D.   On: 02/23/2014 08:10   Dg Chest Port 1 View  02/20/2014   CLINICAL DATA:  Shortness of breath  EXAM: PORTABLE CHEST - 1 VIEW  COMPARISON:  03/11/2014  FINDINGS: A temporary dialysis catheter is again noted on the right. The cardiac shadow is stable. The lungs are well aerated bilaterally without focal infiltrate or sizable effusion.  IMPRESSION: No acute abnormality noted.   Electronically Signed   By: Inez Catalina M.D.   On: 02/20/2014 08:08   Dg Chest Port 1 View  02/18/2014   CLINICAL DATA:  Status post central line placement  EXAM: PORTABLE CHEST - 1 VIEW  COMPARISON:  03/13/2014 1251 hrs  FINDINGS: The cardiac shadow is stable. A temporary dialysis catheter is now seen in the distal superior vena cava. No pneumothorax is noted. Mild atelectatic changes are noted in the right base. This may be related to the poor inspiratory effort.  IMPRESSION: Status post temporary dialysis catheter placement without pneumothorax.   Electronically Signed   By: Inez Catalina M.D.   On: 02/27/2014 19:31   Dg Chest Portable 1 View  03/14/2014   CLINICAL DATA:  Shortness of breath. Non-Hodgkin's lymphoma. Diabetes and hypertension.  EXAM: PORTABLE CHEST - 1 VIEW  COMPARISON:  DG CHEST 2 VIEW dated 02/16/2014; DG CHEST 1 VIEW dated 02/03/2014; CT CHEST W/CM dated 12/02/2013  FINDINGS: The heart is mildly enlarged. Right middle lobe nodule appears larger,  now measuring 2.7 cm. Probable right upper lobe nodule is only partially seen, measuring approximately 1 cm in diameter radiographically. Lesion was larger based on prior CT evaluation. There is slight prominence the left hilar region but this is felt to be stable compared with prior radiographs and CT. No new consolidation or pleural effusion.  IMPRESSION: Persistent right lung nodules. Right middle lobe nodule is larger ring compared prior studies.   Electronically Signed   By: Shon Hale M.D.   On: 02/26/2014 13:08     Medications: Scheduled Meds: . bisacodyl  10 mg Rectal Daily  . ciprofloxacin  500 mg Oral Q breakfast  . docusate sodium  200 mg Oral BID  . doxycycline  100 mg Oral Q12H  . heparin subcutaneous  5,000 Units Subcutaneous 3 times per day  . hydrocortisone sod succinate (SOLU-CORTEF) inj  50 mg Intravenous Daily  . insulin aspart  0-9 Units Subcutaneous 6 times per day  . multivitamin  1 tablet Oral QHS  . pantoprazole  40 mg Oral Q1200  . polyethylene glycol  17 g Oral Daily  . sodium bicarbonate  1,300 mg Oral TID  . sodium chloride  3 mL Intravenous Q12H      LOS: 11 days   Thurnell Lose M.D. [facility] 03/02/2014, 12:55 PM Pager: [phone]  If 7PM-7AM, please contact night-coverage [email or web address] Password [email or web address]

## 2014-03-02 NOTE — Evaluation (Signed)
Occupational Therapy Evaluation Patient Details Name: Douglas [name] MRN: [id number] DOB: Aug 31, 1956 Today's Date: 03/02/2014    History of Present Illness 58 yo M with pmh of follicular low-grade non-Hodgkin's lymphoma with large left groin lymphadenopathy diagnosed in 2009 with recent chemotherapy and admission for AKI 3/17-3/20 who presented as a transfer from [place] with 1.5 week history of shortness of breath found to be volume overloaded with hyperkalemia and acute renal failure requiring emergent HD.     Clinical Impression   Pt demonstrates decline in function and safety with ADLs and ADL mobility with decreased strength, balance and endurance. Pt has morbidly obese with significant edema in L LE, limiting his function and mobility. Pt would benefit from acute OT services to increase level of function and safety    Follow Up Recommendations  SNF;Supervision/Assistance - 24 hour    Equipment Recommendations  None recommended by OT;Other (comment) (TBD at next venue of care)    Recommendations for Other Services       Precautions / Restrictions Precautions Precautions: Fall Restrictions Weight Bearing Restrictions: No      Mobility Bed Mobility Overal bed mobility: +2 for physical assistance             General bed mobility comments: pt has diffculty moving LLE. He is able to to use arms and RLE to assist with bed mobility.   Transfers Overall transfer level: Needs assistance               General transfer comment: attempted x2 to get to sitting on edge of bed. He has difficlty flexing left hip enough to get his balance in sitting due to the edema and bed did not go low enough to get his feet the floor.     Balance Overall balance assessment: Needs assistance Sitting-balance support: Bilateral upper extremity supported Sitting balance-Leahy Scale: Poor Sitting balance - Comments: unable to reach floor with feet or sit up straight due to  edema Postural control: Right lateral lean                                  ADL Overall ADL's : Needs assistance/impaired     Grooming: Wash/dry hands;Wash/dry face;Set up;Bed level Grooming Details (indicate cue type and reason): attempted x2 to get to sitting on edge of bed. He has difficlty flexing left hip enough to get his balance in sitting due to the edema and bed did not go low enough to get his feet the floor.  Upper Body Bathing: Minimal assitance;Bed level   Lower Body Bathing: Total assistance   Upper Body Dressing : Minimal assistance;Bed level   Lower Body Dressing: Total assistance     Toilet Transfer Details (indicate cue type and reason): attempted x2 to get to sitting on edge of bed. He has difficlty flexing left hip enough to get his balance in sitting due to the edema and bed did not go low enough to get his feet the floor.  Toileting- Clothing Manipulation and Hygiene: Total assistance         General ADL Comments: attempted x2 to get to sitting on edge of bed. He has difficlty flexing left hip enough to get his balance in sitting due to the edema and bed did not go low enough to get his feet the floor.      Vision  wears glasses for distance  Pertinent Vitals/Pain No c/o pain , VSS     Hand Dominance Right   Extremity/Trunk Assessment Upper Extremity Assessment Upper Extremity Assessment: Generalized weakness   Lower Extremity Assessment Lower Extremity Assessment: Defer to PT evaluation RLE Deficits / Details: hemosiderin staining and trophic changes in lower leg Pt is able to move his leg against gravity and use it to push off to assist with bed mobility LLE Deficits / Details: lymphedema in left leg. greater in thigh and lateral hip likely from lympadenopathy. Pt with vac on left groin draining lyphmpatic fluid.  Skin in firbrotic and firm , but slightly moveable. Edema also in lower leg and  foot with hemosiderin staining and fibrotic changes in lower leg. He is able move foot and knee and needs assist to move hip due to weight of the leg..Circumerference aroung thigh at poximal mid thigh skin fold is  44.5 inches       Communication Communication Communication: No difficulties   Cognition Arousal/Alertness: Awake/alert Behavior During Therapy: WFL for tasks assessed/performed Overall Cognitive Status: Within Functional Limits for tasks assessed                     General Comments   Pt pleasant and cooperative                 Home Living Family/patient expects to be discharged to:: Private residence Living Arrangements: Non-relatives/Friends Available Help at Discharge: Other (Comment) (girlfriend goes to work at 3 p.m.) Type of Home: House Home Access: Stairs to enter Technical brewer of Steps: 1   Home Layout: One level     Bathroom Shower/Tub: Teacher, early years/pre: Handicapped height     Home Equipment: Slaughter - single point          Prior Functioning/Environment Level of Independence: Independent with assistive device(s)             OT Diagnosis: Generalized weakness;Acute pain   OT Problem List: Decreased strength;Pain;Decreased activity tolerance;Decreased knowledge of use of DME or AE;Impaired balance (sitting and/or standing);Increased edema   OT Treatment/Interventions: Self-care/ADL training;Therapeutic activities;Therapeutic exercise;Neuromuscular education;Patient/family education;DME and/or AE instruction    OT Goals(Current goals can be found in the care plan section) Acute Rehab OT Goals Patient Stated Goal: to get moving OT Goal Formulation: With patient Time For Goal Achievement: 03/09/14 Potential to Achieve Goals: Good ADL Goals Pt Will Perform Grooming: with min assist;with min guard assist;sitting (EOB, chair) Pt Will Perform Upper Body Bathing: with min guard assist;sitting (EOB, chair) Pt Will  Perform Lower Body Bathing: with max assist;with mod assist;sitting/lateral leans Pt Will Perform Upper Body Dressing: with min guard assist;sitting (EOB, chair) Pt Will Transfer to Toilet: with max assist;bedside commode Additional ADL Goal #1: Pt will complete bed mobility wiht max A  to sit EOB in prep for ADLs, BSC transfers  OT Frequency: Min 2X/week   Barriers to D/C: Decreased caregiver support          Co-evaluation PT/OT/SLP Co-Evaluation/Treatment: Yes Reason for Co-Treatment: Complexity of the patient's impairments (multi-system involvement);For patient/therapist safety   OT goals addressed during session: ADL's and self-care      End of Session    Activity Tolerance: Patient limited by fatigue Patient left: in bed;with call bell/phone within reach   Time: 1134-1213 OT Time Calculation (min): 39 min Charges:  OT General Charges $OT Visit: 1 Procedure OT Evaluation $Initial OT Evaluation Tier I: 1 Procedure OT Treatments $Therapeutic Activity: 8-22 mins G-Codes:  Mosetta Putt 03/02/2014, 3:16 PM

## 2014-03-03 ENCOUNTER — Ambulatory Visit: Payer: BC Managed Care – PPO

## 2014-03-03 DIAGNOSIS — D638 Anemia in other chronic diseases classified elsewhere: Secondary | ICD-10-CM

## 2014-03-03 DIAGNOSIS — E883 Tumor lysis syndrome: Secondary | ICD-10-CM

## 2014-03-03 LAB — RENAL FUNCTION PANEL
ALBUMIN: 2 g/dL — AB (ref 3.5–5.2)
BUN: 73 mg/dL — AB (ref 6–23)
CO2: 16 mEq/L — ABNORMAL LOW (ref 19–32)
CREATININE: 6.7 mg/dL — AB (ref 0.50–1.35)
Calcium: 9.4 mg/dL (ref 8.4–10.5)
Chloride: 84 mEq/L — ABNORMAL LOW (ref 96–112)
GFR calc Af Amer: 10 mL/min — ABNORMAL LOW (ref >90)
GFR, EST NON AFRICAN AMERICAN: 8 mL/min — AB (ref >90)
Glucose, Bld: 101 mg/dL — ABNORMAL HIGH (ref 70–99)
PHOSPHORUS: 7.2 mg/dL — AB (ref 2.3–4.6)
Potassium: 4.9 mEq/L (ref 3.7–5.3)
Sodium: 129 mEq/L — ABNORMAL LOW (ref 137–147)

## 2014-03-03 LAB — CBC WITH DIFFERENTIAL/PLATELET
Basophils Absolute: 0 10*3/uL (ref 0.0–0.1)
Basophils Relative: 0 % (ref 0–1)
EOS ABS: 0.1 10*3/uL (ref 0.0–0.7)
Eosinophils Relative: 1 % (ref 0–5)
HEMATOCRIT: 23.5 % — AB (ref 39.0–52.0)
Hemoglobin: 8 g/dL — ABNORMAL LOW (ref 13.0–17.0)
LYMPHS PCT: 12 % (ref 12–46)
Lymphs Abs: 1.3 10*3/uL (ref 0.7–4.0)
MCH: 28.5 pg (ref 26.0–34.0)
MCHC: 34 g/dL (ref 30.0–36.0)
MCV: 83.6 fL (ref 78.0–100.0)
MONOS PCT: 12 % (ref 3–12)
Monocytes Absolute: 1.3 10*3/uL — ABNORMAL HIGH (ref 0.1–1.0)
NEUTROS ABS: 8.3 10*3/uL — AB (ref 1.7–7.7)
NEUTROS PCT: 75 % (ref 43–77)
Platelets: 147 10*3/uL — ABNORMAL LOW (ref 150–400)
RBC: 2.81 MIL/uL — ABNORMAL LOW (ref 4.22–5.81)
RDW: 18.3 % — ABNORMAL HIGH (ref 11.5–15.5)
WBC: 11 10*3/uL — AB (ref 4.0–10.5)

## 2014-03-03 LAB — GLUCOSE, CAPILLARY
GLUCOSE-CAPILLARY: 125 mg/dL — AB (ref 70–99)
GLUCOSE-CAPILLARY: 139 mg/dL — AB (ref 70–99)
Glucose-Capillary: 112 mg/dL — ABNORMAL HIGH (ref 70–99)
Glucose-Capillary: 109 mg/dL — ABNORMAL HIGH (ref 70–99)

## 2014-03-03 MED ORDER — SIMETHICONE 80 MG PO CHEW
80.0000 mg | CHEWABLE_TABLET | Freq: Four times a day (QID) | ORAL | Status: DC | PRN
  Administered 2014-03-03: 80 mg via ORAL
  Filled 2014-03-03 (×3): qty 1

## 2014-03-03 NOTE — Progress Notes (Signed)
[facility] PROGRESS NOTE  Douglas [name] XAJ:[id number] DOB: 1956/08/08 DOA: 02/25/2014 PCP: [name], MD Brief interim history  Patient is a 58 year old male with history of follicular low-grade non-Hodgkin lymphoma, large left groin lymphadenopathy, diagnosed in 2000 and was on recent chemotherapy, had admission for acute renal failure 3/17- 3/20. He was suspected to have acute renal failure de to HCTZ and ARB.  He had outpatient left-sided inguinal node biopsy after which he has developed a large lymph filled seroma on the left groin requiring wound VAC placement and continuous drainage, he continues to drain about liter liter and a half on a daily basis, there were some superinfection which is responding well to antibiotics which are presently on board. Have discussed his case multiple times with general surgery who do not recommend any further surgical intervention. Once he has a rehabilitation bed he could be placed in a rehabilitation as he requires intermittent IV fluid boluses for ongoing fluid loss due to daily lymph drainage from the wound VAC. SIGNIFICANT EVENTS / STUDIES:  4/3 Hyperkalemia, hyperuricemia, hyperphosphatemia, with ARF requiring emergent CRRT. UA with urate crystals. Renal US normal. No pulmonary edema on CXR. Heparin gtt started due to concern for PE. Started IV vanc for left groin wound.  4/4 ARF thought to be due to ATN from severe sepsis, per oncology not tumor lysis syndrome. No DVT, heparin gtt stopped. 2D-echo normal.  4/5 Continue CRRT, to transition to HD, no renal recovery yet. Hyperuricemia resolved. Dialysis access is a right IJ dialysis catheter  4/6 .Wound vac placed  4/7 -hemodialysis initiated  Assessment/Plan:  Acute kidney injury-potentially related to tumor lysis, :  - Etiology prerenal from volume loss from her fistula versus severe sepsis from left groin infection. Per Oncology tumor lysis syndrome less likely. -dialysis per renal on a  3 times a week basis.  NHL (non-Hodgkin's lymphoma) with follicular low-grade, large left groin lymph adenopathy status post chemotherapy, hyperuricemia  Hyperuricemia has resolved, oncology following (Dr.Magrinat), stable G6PD, LDH levels are around 600 which is twice normal, transfuse if hemoglobin less than 7, currently stable. CT abdomen for restaging outpatient home along with outpatient followup with oncology post discharge.  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 Hulen Skains on 02/24/2014, patient to be discharged with home health and wound VAC, will follow with Dr. Barkley Bruns in the office post discharge.  -Noted wound growing gram-negative rods we'll switch to doxycycline + cipro on 02/25/2014 and monitor temperature curve, leukocytosis improving. Responding well to antibiotics continue for a total of 10 days stop date 03/07/2014.  Intermittent tachycardic and hypotension  Likely due to fluid removal during HD, along with ongoing fluid loss from his left inguinal drain which is draining between 1-1/2-2 L a day, will supplement with IV fluids as needed, have discussed with surgery, they have no other solution then to continue drainage this time. Per Dr. Barkley Bruns and Hulen Skains no further surgical intervention is required.  He was on steroids for hypotension while in the ICU which have been tapered down last it will be today.  Type 2 diabetes mellitus  - Continue sliding scale insulin  CBG (last 3)   Recent Labs   03/02/14 0412  03/02/14 0759  03/02/14 1218   GLUCAP  140*  138*  165*    GERD with history of GI bleed: Continue PPI  DVT Prophylaxis: Heparin subcutaneous  Code Status: Full CODE STATUS  Family Communication:  Disposition: Rehabilitation  Consultants:  Surgery  Nephrology  Oncology     Antibiotics:  Doxycycline  Cipro  HPI/Subjective: Denies chest pain no shortness of  breath  Objective: Filed Vitals:   03/03/14 1808  BP: 95/64  Pulse: 120  Temp: 98.2 F (36.8 C)  Resp: 16    Intake/Output Summary (Last 24 hours) at 03/03/14 1853 Last data filed at 03/03/14 1833  Gross per 24 hour  Intake   1080 ml  Output   3081 ml  Net  -2001 ml   Filed Weights   02/26/14 1244 02/26/14 2044  Weight: 201 kg (443 lb 2 oz) 206 kg (454 lb 2.4 oz)    Exam:  General: alert & oriented x 3 In NAD,Morbidly obese  Cardiovascular: RRR, nl S1 s2 Respiratory: Decreased breath sounds at the bases, otherwise clear  Abdomen: soft +BS NT/ND, no masses palpable Extremities: +edema, left groin with wound status post debridement with wound VAC    Data Reviewed: Basic Metabolic Panel:  Recent Labs Lab 02/25/14 0637 02/26/14 0856 02/28/14 0640 03/01/14 0601 03/03/14 0905  NA 135* 130* 127* 126* 129*  K 4.2 4.2 4.3 5.0 4.9  CL 91* 89* 86* 83* 84*  CO2 19 18* 17* 17* 16*  GLUCOSE 112* 128* 127* 107* 101*  BUN 35* 48* 51* 66* 73*  CREATININE 3.87* 5.10* 5.24* 6.26* 6.70*  CALCIUM 9.2 9.5 9.4 9.7 9.4  PHOS 3.7 4.8* 5.0* 6.6* 7.2*   Liver Function Tests:  Recent Labs Lab 02/25/14 9390 02/26/14 0856 02/28/14 0640 03/01/14 0601 03/03/14 0905  ALBUMIN 2.3* 2.2* 2.1* 2.1* 2.0*   No results found for this basename: LIPASE, AMYLASE,  in the last 168 hours No results found for this basename: AMMONIA,  in the last 168 hours CBC:  Recent Labs Lab 02/27/14 0622 02/28/14 0640 03/01/14 0601 03/02/14 0535 03/03/14 0905  WBC 11.3* 12.2* 10.6* 10.4 11.0*  NEUTROABS 8.9* 9.3* 8.2* 8.3* 8.3*  HGB 8.2* 8.3* 8.0* 7.9* 8.0*  HCT 24.3* 25.2* 23.5* 23.5* 23.5*  MCV 85.3 85.4 83.9 84.2 83.6  PLT 161 160 167 159 147*   Cardiac Enzymes: No results found for this basename: CKTOTAL, CKMB, CKMBINDEX, TROPONINI,  in the last 168 hours BNP (last 3 results)  Recent Labs  02/26/2014 1220  PROBNP 937.1*   CBG:  Recent Labs Lab 03/02/14 1645 03/02/14 2156  03/03/14 0806 03/03/14 1137 03/03/14 1806  GLUCAP 159* 123* 112* 109* 125*    No results found for this or any previous visit (from the past 240 hour(s)).   Studies: No results found.  Scheduled Meds: . bisacodyl  10 mg Rectal Daily  . ciprofloxacin  500 mg Oral Q breakfast  . docusate sodium  200 mg Oral BID  . doxycycline  100 mg Oral Q12H  . heparin subcutaneous  5,000 Units Subcutaneous 3 times per day  . insulin aspart  0-9 Units Subcutaneous 6 times per day  . multivitamin  1 tablet Oral QHS  . pantoprazole  40 mg Oral Q1200  . polyethylene glycol  17 g Oral Daily  . sodium bicarbonate  1,300 mg Oral TID  . sodium chloride  3 mL Intravenous Q12H   Continuous Infusions:   Active Problems:   NHL (non-Hodgkin's lymphoma)   HTN (hypertension)   Dyslipidemia   Anemia of chronic disease   Morbid obesity   Lower GI bleed   Diabetes mellitus   Tumor lysis syndrome, possibility of   Uremia   SOB (shortness of breath) DDX = CHF  vs. PE   Acidosis    Time spent: [facility] Pager [phone]. If 7PM-7AM, please contact night-coverage at [email or web address], password [facility] 03/03/2014, 6:53 PM  LOS: 12 days

## 2014-03-03 NOTE — Procedures (Signed)
I was present at this dialysis session. I have reviewed the session itself and made appropriate changes.   No meaningful UOP.  Had nontunneled R IJ, will need to start thinking about tunneling. RS  [name]  MD 03/03/2014, 2:54 PM

## 2014-03-03 NOTE — Progress Notes (Signed)
Orthopedic Tech Progress Note Patient Details:  Douglas [name] 02/19/1956 [id number]  Ortho Devices Type of Ortho Device: Soft collar Ortho Device/Splint Interventions: Application   Douglas [name] 03/03/2014, 10:43 AM

## 2014-03-04 ENCOUNTER — Ambulatory Visit: Payer: BC Managed Care – PPO

## 2014-03-04 DIAGNOSIS — I10 Essential (primary) hypertension: Secondary | ICD-10-CM

## 2014-03-04 LAB — CBC WITH DIFFERENTIAL/PLATELET
BASOS PCT: 0 % (ref 0–1)
Basophils Absolute: 0 10*3/uL (ref 0.0–0.1)
EOS PCT: 1 % (ref 0–5)
Eosinophils Absolute: 0.1 10*3/uL (ref 0.0–0.7)
HEMATOCRIT: 23.7 % — AB (ref 39.0–52.0)
HEMOGLOBIN: 7.8 g/dL — AB (ref 13.0–17.0)
Lymphocytes Relative: 12 % (ref 12–46)
Lymphs Abs: 1.4 10*3/uL (ref 0.7–4.0)
MCH: 27.8 pg (ref 26.0–34.0)
MCHC: 32.9 g/dL (ref 30.0–36.0)
MCV: 84.3 fL (ref 78.0–100.0)
MONO ABS: 1.4 10*3/uL — AB (ref 0.1–1.0)
Monocytes Relative: 12 % (ref 3–12)
NEUTROS ABS: 8.6 10*3/uL — AB (ref 1.7–7.7)
NEUTROS PCT: 75 % (ref 43–77)
Platelets: 142 10*3/uL — ABNORMAL LOW (ref 150–400)
RBC: 2.81 MIL/uL — AB (ref 4.22–5.81)
RDW: 18.5 % — ABNORMAL HIGH (ref 11.5–15.5)
WBC: 11.5 10*3/uL — ABNORMAL HIGH (ref 4.0–10.5)

## 2014-03-04 LAB — RENAL FUNCTION PANEL
Albumin: 2 g/dL — ABNORMAL LOW (ref 3.5–5.2)
BUN: 58 mg/dL — ABNORMAL HIGH (ref 6–23)
CHLORIDE: 85 meq/L — AB (ref 96–112)
CO2: 15 meq/L — AB (ref 19–32)
Calcium: 9.2 mg/dL (ref 8.4–10.5)
Creatinine, Ser: 5.57 mg/dL — ABNORMAL HIGH (ref 0.50–1.35)
GFR calc Af Amer: 12 mL/min — ABNORMAL LOW (ref >90)
GFR, EST NON AFRICAN AMERICAN: 10 mL/min — AB (ref >90)
Glucose, Bld: 104 mg/dL — ABNORMAL HIGH (ref 70–99)
POTASSIUM: 4.8 meq/L (ref 3.7–5.3)
Phosphorus: 6.8 mg/dL — ABNORMAL HIGH (ref 2.3–4.6)
Sodium: 131 mEq/L — ABNORMAL LOW (ref 137–147)

## 2014-03-04 LAB — GLUCOSE, CAPILLARY
GLUCOSE-CAPILLARY: 111 mg/dL — AB (ref 70–99)
Glucose-Capillary: 131 mg/dL — ABNORMAL HIGH (ref 70–99)
Glucose-Capillary: 144 mg/dL — ABNORMAL HIGH (ref 70–99)
Glucose-Capillary: 105 mg/dL — ABNORMAL HIGH (ref 70–99)
Glucose-Capillary: 128 mg/dL — ABNORMAL HIGH (ref 70–99)

## 2014-03-04 NOTE — Clinical Social Work Note (Signed)
Patient will discharge to [facility] once insurance authorization received from [place]. CSW informed that insurance company requested that patient sit up in a chair for 30 minutes. CSW provided the nursing note regarding patient sitting up, along with additional OT and PT treatment notes.  Ocie Stanzione Givens, MSW, LCSW [phone]

## 2014-03-04 NOTE — Progress Notes (Addendum)
[facility] PROGRESS NOTE  Douglas [name] WPY:[id number] DOB: 1956-07-17 DOA: 03/11/2014 PCP: [name], MD Brief interim history  Patient is a 58 year old male with history of follicular low-grade non-Hodgkin lymphoma, large left groin lymphadenopathy, diagnosed in 2000 and was on recent chemotherapy, had admission for acute renal failure 3/17- 3/20. He was suspected to have acute renal failure de to HCTZ and ARB.  He had outpatient left-sided inguinal node biopsy after which he has developed a large lymph filled seroma on the left groin requiring wound VAC placement and continuous drainage, he continues to drain about liter liter and a half on a daily basis, there were some superinfection which is responding well to antibiotics which are presently on board. Have discussed his case multiple times with general surgery who do not recommend any further surgical intervention. Once he has a rehabilitation bed he could be placed in a rehabilitation as he requires intermittent IV fluid boluses for ongoing fluid loss due to daily lymph drainage from the wound VAC. SIGNIFICANT EVENTS / STUDIES:  4/3 Hyperkalemia, hyperuricemia, hyperphosphatemia, with ARF requiring emergent CRRT. UA with urate crystals. Renal US normal. No pulmonary edema on CXR. Heparin gtt started due to concern for PE. Started IV vanc for left groin wound.  4/4 ARF thought to be due to ATN from severe sepsis, per oncology not tumor lysis syndrome. No DVT, heparin gtt stopped. 2D-echo normal.  4/5 Continue CRRT, to transition to HD, no renal recovery yet. Hyperuricemia resolved. Dialysis access is a right IJ dialysis catheter  4/6 .Wound vac placed  4/7 -hemodialysis initiated  Assessment/Plan:  Acute kidney injury-potentially related to tumor lysis, :  - Etiology prerenal from volume loss from her fistula versus severe sepsis from left groin infection. Per Oncology tumor lysis syndrome less likely. -dialysis per renal on a  3 times a week basis.  NHL (non-Hodgkin's lymphoma) with follicular low-grade, large left groin lymph adenopathy status post chemotherapy, hyperuricemia  Hyperuricemia has resolved, oncology following (Dr.Magrinat), stable G6PD, LDH levels are around 600 which is twice normal, transfuse if hemoglobin less than 7, currently stable. CT abdomen for restaging outpatient home along with outpatient followup with oncology post discharge.  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 Hulen Skains on 02/24/2014, patient to be discharged with home health and wound VAC, will follow with Dr. Barkley Bruns in the office post discharge.  -Noted wound growing gram-negative rods was switched to doxycycline + cipro on 02/25/2014 and monitor temperature curve, leukocytosis improving. Responding well to antibiotics continue for a total of 10 days stop date 03/07/2014.  -pt afebrile, follow recheck wbc Intermittent tachycardic and hypotension  Likely due to fluid removal during HD, along with ongoing fluid loss from his left inguinal drain which is draining between 1-1/2-2 L a day, will supplement with IV fluids as needed, have discussed with surgery, they have no other solution then to continue drainage this time. Per Dr. Barkley Bruns and Hulen Skains no further surgical intervention is required.  He was on steroids for hypotension while in the ICU which have been tapered off .  Type 2 diabetes mellitus  - Continue sliding scale insulin  CBG (last 3)   Recent Labs   03/02/14 0412  03/02/14 0759  03/02/14 1218   GLUCAP  140*  138*  165*    GERD with history of GI bleed: Continue PPI  Deconditioning Pt will need SNF on admission -SW assisting with placement DVT Prophylaxis: Heparin subcutaneous  Code Status: Full CODE STATUS  Family Communication:  Disposition: Rehabilitation  Consultants:  Surgery  Nephrology   Oncology     Antibiotics:  Doxycycline  Cipro  HPI/Subjective: States he had a BM today and feels better overall. Reports he sat up in chair with PT today for 46mins  Objective: Filed Vitals:   03/04/14 1720  BP: 130/69  Pulse: 96  Temp: 97.6 F (36.4 C)  Resp: 20    Intake/Output Summary (Last 24 hours) at 03/04/14 1844 Last data filed at 03/04/14 1300  Gross per 24 hour  Intake   1200 ml  Output    500 ml  Net    700 ml   Filed Weights   02/26/14 1244 02/26/14 2044  Weight: 201 kg (443 lb 2 oz) 206 kg (454 lb 2.4 oz)    Exam:  General: alert & oriented x 3 In NAD,Morbidly obese  Cardiovascular: RRR, nl S1 s2 Respiratory: Decreased breath sounds at the bases, otherwise clear  Abdomen: soft +BS NT/ND, no masses palpable Extremities: +edema, left groin with wound status post debridement with wound VAC    Data Reviewed: Basic Metabolic Panel:  Recent Labs Lab 02/26/14 0856 02/28/14 0640 03/01/14 0601 03/03/14 0905 03/04/14 0550  NA 130* 127* 126* 129* 131*  K 4.2 4.3 5.0 4.9 4.8  CL 89* 86* 83* 84* 85*  CO2 18* 17* 17* 16* 15*  GLUCOSE 128* 127* 107* 101* 104*  BUN 48* 51* 66* 73* 58*  CREATININE 5.10* 5.24* 6.26* 6.70* 5.57*  CALCIUM 9.5 9.4 9.7 9.4 9.2  PHOS 4.8* 5.0* 6.6* 7.2* 6.8*   Liver Function Tests:  Recent Labs Lab 02/26/14 0856 02/28/14 0640 03/01/14 0601 03/03/14 0905 03/04/14 0550  ALBUMIN 2.2* 2.1* 2.1* 2.0* 2.0*   No results found for this basename: LIPASE, AMYLASE,  in the last 168 hours No results found for this basename: AMMONIA,  in the last 168 hours CBC:  Recent Labs Lab 02/28/14 0640 03/01/14 0601 03/02/14 0535 03/03/14 0905 03/04/14 0550  WBC 12.2* 10.6* 10.4 11.0* 11.5*  NEUTROABS 9.3* 8.2* 8.3* 8.3* 8.6*  HGB 8.3* 8.0* 7.9* 8.0* 7.8*  HCT 25.2* 23.5* 23.5* 23.5* 23.7*  MCV 85.4 83.9 84.2 83.6 84.3  PLT 160 167 159 147* 142*   Cardiac Enzymes: No results found for this basename: CKTOTAL, CKMB,  CKMBINDEX, TROPONINI,  in the last 168 hours BNP (last 3 results)  Recent Labs  03/07/2014 1220  PROBNP 937.1*   CBG:  Recent Labs Lab 03/03/14 1137 03/03/14 1806 03/03/14 2212 03/04/14 0752 03/04/14 1158  GLUCAP 109* 125* 139* 131* 144*    No results found for this or any previous visit (from the past 240 hour(s)).   Studies: No results found.  Scheduled Meds: . bisacodyl  10 mg Rectal Daily  . ciprofloxacin  500 mg Oral Q breakfast  . docusate sodium  200 mg Oral BID  . doxycycline  100 mg Oral Q12H  . heparin subcutaneous  5,000 Units Subcutaneous 3 times per day  . insulin aspart  0-9 Units Subcutaneous 6 times per day  . multivitamin  1 tablet Oral QHS  . pantoprazole  40 mg Oral Q1200  . polyethylene glycol  17 g Oral Daily  . sodium bicarbonate  1,300 mg Oral TID  . sodium chloride  3 mL Intravenous Q12H   Continuous Infusions:   Active Problems:   NHL (non-Hodgkin's lymphoma)   HTN (hypertension)   Dyslipidemia   Anemia of chronic disease   Morbid  obesity   Lower GI bleed   Diabetes mellitus   Tumor lysis syndrome, possibility of   Uremia   SOB (shortness of breath) DDX = CHF vs. PE   Acidosis    Time spent: [facility] Pager [phone]. If 7PM-7AM, please contact night-coverage at [email or web address], password [facility] 03/04/2014, 6:44 PM  LOS: 13 days

## 2014-03-04 NOTE — Progress Notes (Signed)
Physical Therapy Treatment Patient Details Name: Douglas [name] MRN: [id number] DOB: 04/01/1956 Today's Date: 03/04/2014    History of Present Illness 58 yo M with pmh of follicular low-grade non-Hodgkin's lymphoma with large left groin lymphadenopathy diagnosed in 2009 with recent chemotherapy and admission for AKI 3/17-3/20 who presented as a transfer from [place] with 1.5 week history of shortness of breath found to be volume overloaded with hyperkalemia and acute renal failure requiring emergent HD.      PT Comments    Pt with increased mobility today able to stand and pivot to chair toward Right with multiple standing trials. Pt very motivated to return home and be stronger and feel pt would benefit from CIR for intensive therapy at DC. Pt encouraged to assist with bed mobility and perform HEP daily. Will continue to follow.   Follow Up Recommendations  CIR;Supervision/Assistance - 24 hour     Equipment Recommendations  Rolling walker with 5" wheels (bari RW)    Recommendations for Other Services       Precautions / Restrictions Precautions Precautions: Fall Precaution Comments: 454#. LLE lymphedema, wound VAC    Mobility  Bed Mobility Overal bed mobility: Needs Assistance Bed Mobility: Supine to Sit     Supine to sit: Min assist     General bed mobility comments: min assist to pivot LLE to EOB, cues for use of rail with roll to semisidely and min assist to elevate trunk from surface. Increased time and cueing for sequence to scoot to EOB  Transfers Overall transfer level: Needs assistance   Transfers: Sit to/from [employer] Transfers Sit to Stand: Mod assist;+2 physical assistance;+2 safety/equipment   Squat pivot transfers: +2 safety/equipment;+2 physical assistance;Min assist     General transfer comment: pt stood x 4 with RW stabilized in front of pt, LLE blocked to prevent sliding and assist with sheet as belt to fully achieve standing from  elevated bed height. Attempted to stand and pivot to Left x 2 but unsuccessful and moved chair to right with a squat pivot with RW toward Right with pt able to advance bil LE toward right with assist to shift pelvis fully  Ambulation/Gait                 Stairs            Wheelchair Mobility    Modified Rankin (Stroke Patients Only)       Balance   Sitting-balance support: Feet supported Sitting balance-Leahy Scale: Poor Sitting balance - Comments: Pt sat EOB total 20 min with single extremity support with feet supported   Standing balance support: Single extremity supported                        Cognition Arousal/Alertness: Awake/alert Behavior During Therapy: WFL for tasks assessed/performed Overall Cognitive Status: Within Functional Limits for tasks assessed                      Exercises General Exercises - Lower Extremity Long Arc Quad: AROM;Seated;Right;15 reps Hip Flexion/Marching: AROM;Seated;Right;15 reps    General Comments        Pertinent Vitals/Pain 5/10 LLE pain, repositioned with bil legs propped on individual chairs end of session    Home Living                      Prior Function            PT Goals (current  goals can now be found in the care plan section) Progress towards PT goals: Progressing toward goals    Frequency       PT Plan      Co-evaluation             End of Session Equipment Utilized During Treatment: Gait belt (sheet at belt) Activity Tolerance: Patient tolerated treatment well Patient left: in chair;with call bell/phone within reach     Time: 2952-8413 PT Time Calculation (min): 44 min  Charges:  $Therapeutic Activity: 38-52 mins                    G Codes:      Natina Wiginton B Mashal Slavick 03/07/14, 11:06 AM Elwyn Reach, [place]

## 2014-03-04 NOTE — Progress Notes (Signed)
Pt was able to sit in chair for over an hour. Used hoyer to lift pt back to bed.

## 2014-03-04 NOTE — Progress Notes (Signed)
Patient stated that he will wear CPAP tonight and that he can place it on himself.  Was told if he needed help to call RT.

## 2014-03-04 NOTE — Progress Notes (Signed)
Occupational Therapy Treatment Patient Details Name: Demarlo Riojas MRN: [id number] DOB: 1956-10-23 Today's Date: 03/04/2014    History of present illness 58 yo M with pmh of follicular low-grade non-Hodgkin's lymphoma with large left groin lymphadenopathy diagnosed in 2009 with recent chemotherapy and admission for AKI 3/17-3/20 who presented as a transfer from [place] with 1.5 week history of shortness of breath found to be volume overloaded with hyperkalemia and acute renal failure requiring emergent HD.     OT comments  OT returned later after pt finished lunch to complete session. Pt and significant other educated on B UE strengthening exercises with level 2 theraband, Pt sitting up in bed with HOB elevated  Follow Up Recommendations  SNF;Supervision/Assistance - 24 hour    Equipment Recommendations  None recommended by OT;Other (comment) (TBD at next venue of care)    Recommendations for Other Services      Precautions / Restrictions Precautions Precautions: Fall Precaution Comments: 454#. LLE lymphedema, wound VAC Restrictions Weight Bearing Restrictions: No                                                                                   Cognition   Behavior During Therapy: WFL for tasks assessed/performed Overall Cognitive Status: Within Functional Limits for tasks assessed                                      Exercises General Exercises - Upper Extremity Shoulder Flexion: Strengthening;Both;10 reps Shoulder Extension: Strengthening;Both Shoulder ABduction: Strengthening;10 reps;Both Shoulder ADduction: 10 reps;Strengthening;Both Elbow Flexion: Strengthening;10 reps;Both Elbow Extension: Strengthening;Both;10 reps           General Comments  Pt motivated, pleasant and cooperative    Pertinent Vitals/ Pain       No c/o pain, VSS                                                          Frequency Min 2X/week     Progress Toward Goals  OT Goals(current goals can now be found in the care plan section)  Progress towards OT goals: OT to reassess next treatment  ADL Goals Additional ADL Goal #1: Pt will participate in  UE strengtheinig exercises while seated to increase ADL and functional tasks independence  Plan Discharge plan remains appropriate                     End of Session     Activity Tolerance Patient tolerated treatment well   Patient Left in bed;with call bell/phone within reach;with family/visitor present             Time: 1431-1444 OT Time Calculation (min): 13 min  Charges: OT General Charges $OT Visit: 1 Procedure OT Treatments $Self Care/Home Management : 8-22 mins $Therapeutic Exercise: 8-22 mins  Mosetta Putt 03/04/2014, 2:59 PM

## 2014-03-04 NOTE — Progress Notes (Signed)
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      PT Comments    Pt assisted chair to bed per RN request to assist with transfer via [place]. Pt positioned in bed with nursing end of session with bed reinflating. Will continue to follow.   Follow Up Recommendations  CIR;Supervision/Assistance - 24 hour     Equipment Recommendations  Rolling walker with 5" wheels (bari RW)    Recommendations for Other Services       Precautions / Restrictions Precautions Precautions: Fall Precaution Comments: 454#. LLE lymphedema, wound VAC    Mobility  Bed Mobility Overal bed mobility: Needs Assistance Bed mobility: sit to supine         General bed mobility comments: Pt in chair on arrival with requested assist by RN for return to bed. Pt with maximove pad under him but need for 2 person assist in fully reclined chair to slide pad down and position for lift chair to bed. Pt lifted via maximove chair to bed. Once in bed cues for sequence to reach for rail and roll  to position pad and self in bed.   Transfers Overall transfer level: Needs assistance           General transfer comment: chair to bed via maxi move. Pt tolerated OOB to chair for over 1 hr  Ambulation/Gait                 Stairs            Wheelchair Mobility    Modified Rankin (Stroke Patients Only)       Balance   Sitting-balance support: Feet supported Sitting balance-Leahy Scale: Poor Sitting balanc   Standing balance support:                          Cognition Arousal/Alertness: Awake/alert Behavior During Therapy: WFL for tasks assessed/performed Overall Cognitive Status: Within Functional Limits for tasks assessed                      Exercises General Exercises - Lower Extremity      General Comments        Pertinent Vitals/Pain Sore LLE  VSS    Home Living                      Prior Function            PT Goals (current goals can now be found in the care plan section) Progress towards PT goals: Progressing toward goals    Frequency       PT Plan Current plan remains appropriate    Co-evaluation             End of Session Equipment Utilized During Treatment: Other (comment) (maxi move) Activity Tolerance: Patient tolerated treatment well Patient left: in chair;with call bell/phone within reach     Time: 1047-1103 PT Time Calculation (min): 16 min  Charges:  $Therapeutic Activity: 8-22 mins  G Codes:      Sandy Salaam Tijah Hane 03/04/2014, 11:12 AM Elwyn Reach, [place]

## 2014-03-04 NOTE — Progress Notes (Signed)
Admit: March 11, 2014 LOS: 2  46M actively treated for NHL with dialysis dependent AKI, likely ATN  Subjective:  HD yesterday, uneventful Still no meaningful inc in UOP as best can be told Working with PT  04/15 0701 - 04/16 0700 In: 1200 [P.O.:1200] Out: 3031 [Urine:130; Drains:500; Stool:1]  Filed Weights   02/26/14 1244 02/26/14 2044  Weight: 201 kg (443 lb 2 oz) 206 kg (454 lb 2.4 oz)    Current meds: reviewed  Current Labs: reviewed    Physical Exam:  Blood pressure 100/65, pulse 82, temperature 98.2 F (36.8 C), temperature source Oral, resp. rate 20, height 5' 10.08" (1.78 m), weight 206 kg (454 lb 2.4 oz), SpO2 98.00%. Obese, in bed R IJ temp cath in place  RRR, no rub CTAB on ant auscultation L>R LEE, 2+ Nonfocal AAOx3 EOMI, NCAT  Assessment 1. Dialysis dependent AKI, likely from ATN 2. NHL s/p therapy 02/03/14 3. Nonhealing L necrotic lymph node with wound vac 4. Mild hyponatremia 5. Morbid obesity 6. DM2  Plan 1. HD tomorrow 4/17; will review AM labs first 2. Strict I/Os as best able 3. Cont fluid restriction 4. Daily renal panel, still hopeful for recovery of GFR.   [name] MD 03/04/2014, 12:03 PM   Recent Labs Lab 03/01/14 0601 03/03/14 0905 03/04/14 0550  NA 126* 129* 131*  K 5.0 4.9 4.8  CL 83* 84* 85*  CO2 17* 16* 15*  GLUCOSE 107* 101* 104*  BUN 66* 73* 58*  CREATININE 6.26* 6.70* 5.57*  CALCIUM 9.7 9.4 9.2  PHOS 6.6* 7.2* 6.8*    Recent Labs Lab 03/02/14 0535 03/03/14 0905 03/04/14 0550  WBC 10.4 11.0* 11.5*  NEUTROABS 8.3* 8.3* 8.6*  HGB 7.9* 8.0* 7.8*  HCT 23.5* 23.5* 23.7*  MCV 84.2 83.6 84.3  PLT 159 147* 142*

## 2014-03-04 NOTE — Progress Notes (Signed)
Occupational Therapy Treatment Patient Details Name: Douglas [name] MRN: [id number] DOB: 18-Jul-1956 Today's Date: 03/04/2014    History of present illness 58 yo M with pmh of follicular low-grade non-Hodgkin's lymphoma with large left groin lymphadenopathy diagnosed in 2009 with recent chemotherapy and admission for AKI 3/17-3/20 who presented as a transfer from [place] with 1.5 week history of shortness of breath found to be volume overloaded with hyperkalemia and acute renal failure requiring emergent HD.     OT comments  Session limited due to arrival of lunch tray. Pt participated in ADLs at bed level with OB elevated and rolling in bed. Did not sit EOB with OT this session as pt requires 2 person assist for sitting EOB and for bed mobility to sit EOB and for transfers. OT will return later to complete session  Follow Up Recommendations  SNF;Supervision/Assistance - 24 hour    Equipment Recommendations   TBD   Recommendations for Other Services      Precautions / Restrictions Precautions Precautions: Fall Precaution Comments: 454#. LLE lymphedema, wound VAC Restrictions Weight Bearing Restrictions: No       Mobility Bed Mobility Overal bed mobility: Needs Assistance Bed Mobility: Rolling Rolling: Mod assist;Max assist            Transfers                 General transfer comment: requires maxi move                                        ADL       Grooming: Wash/dry hands;Wash/dry face;Set up;Bed level   Upper Body Bathing: Set up;Bed level;Minimal assitance Upper Body Bathing Details (indicate cue type and reason): HOB elevated Lower Body Bathing: Maximal assistance;Total assistance;Bed level Lower Body Bathing Details (indicate cue type and reason): HOB elevated, rolling Upper Body Dressing : Set up;Minimal assistance;Bed level Upper Body Dressing Details (indicate cue type and reason): HOB elevated                    General ADL Comments: EOB not attempted with only 1 perosn assist this session                                      Cognition   Behavior During Therapy: WFL for tasks assessed/performed Overall Cognitive Status: Within Functional Limits for tasks assessed                                                General Comments  Pt pleasant and cooperative    Pertinent Vitals/ Pain       No c/o pain, VSS                                            Prior Functioning/Environment  independent, working full time           Frequency Min 2X/week     Progress Toward Goals  OT Goals(current goals can now be found in the care plan section)  Progress towards OT goals: OT to  reassess next treatment     Plan Discharge plan remains appropriate                     End of Session     Activity Tolerance Patient tolerated treatment well   Patient Left in bed;with call bell/phone within reach             Time: 1251-1302 OT Time Calculation (min): 11 min  Charges: OT General Charges $OT Visit: 1 Procedure OT Treatments $Self Care/Home Management : 8-22 mins  Mosetta Putt 03/04/2014, 2:52 PM

## 2014-03-04 NOTE — Progress Notes (Signed)
Rehab Admissions Coordinator Note:  Patient was screened by Retta Diones for appropriateness for an Inpatient Acute Rehab Consult.  At this time, we are recommending Inpatient Rehab consult.  Douglas [name] 03/04/2014, 1:36 PM  I can be reached at [phone].

## 2014-03-05 LAB — RENAL FUNCTION PANEL
Albumin: 2 g/dL — ABNORMAL LOW (ref 3.5–5.2)
BUN: 70 mg/dL — ABNORMAL HIGH (ref 6–23)
CO2: 16 meq/L — AB (ref 19–32)
CREATININE: 6.64 mg/dL — AB (ref 0.50–1.35)
Calcium: 9.3 mg/dL (ref 8.4–10.5)
Chloride: 84 mEq/L — ABNORMAL LOW (ref 96–112)
GFR calc Af Amer: 10 mL/min — ABNORMAL LOW (ref >90)
GFR, EST NON AFRICAN AMERICAN: 8 mL/min — AB (ref >90)
Glucose, Bld: 107 mg/dL — ABNORMAL HIGH (ref 70–99)
Phosphorus: 8.1 mg/dL — ABNORMAL HIGH (ref 2.3–4.6)
Potassium: 5.4 mEq/L — ABNORMAL HIGH (ref 3.7–5.3)
Sodium: 128 mEq/L — ABNORMAL LOW (ref 137–147)

## 2014-03-05 LAB — CBC WITH DIFFERENTIAL/PLATELET
BASOS ABS: 0 10*3/uL (ref 0.0–0.1)
Basophils Relative: 0 % (ref 0–1)
EOS ABS: 0.1 10*3/uL (ref 0.0–0.7)
Eosinophils Relative: 1 % (ref 0–5)
HCT: 23.2 % — ABNORMAL LOW (ref 39.0–52.0)
Hemoglobin: 7.6 g/dL — ABNORMAL LOW (ref 13.0–17.0)
LYMPHS ABS: 1.5 10*3/uL (ref 0.7–4.0)
Lymphocytes Relative: 13 % (ref 12–46)
MCH: 27.4 pg (ref 26.0–34.0)
MCHC: 32.8 g/dL (ref 30.0–36.0)
MCV: 83.8 fL (ref 78.0–100.0)
Monocytes Absolute: 1.3 10*3/uL — ABNORMAL HIGH (ref 0.1–1.0)
Monocytes Relative: 11 % (ref 3–12)
Neutro Abs: 8.7 10*3/uL — ABNORMAL HIGH (ref 1.7–7.7)
Neutrophils Relative %: 75 % (ref 43–77)
PLATELETS: 131 10*3/uL — AB (ref 150–400)
RBC: 2.77 MIL/uL — ABNORMAL LOW (ref 4.22–5.81)
RDW: 18.2 % — AB (ref 11.5–15.5)
WBC: 11.6 10*3/uL — ABNORMAL HIGH (ref 4.0–10.5)

## 2014-03-05 LAB — GLUCOSE, CAPILLARY
GLUCOSE-CAPILLARY: 113 mg/dL — AB (ref 70–99)
GLUCOSE-CAPILLARY: 124 mg/dL — AB (ref 70–99)
GLUCOSE-CAPILLARY: 138 mg/dL — AB (ref 70–99)
Glucose-Capillary: 116 mg/dL — ABNORMAL HIGH (ref 70–99)
Glucose-Capillary: 98 mg/dL (ref 70–99)

## 2014-03-05 MED ORDER — OXYCODONE-ACETAMINOPHEN 5-325 MG PO TABS
ORAL_TABLET | ORAL | Status: AC
  Filled 2014-03-05: qty 2

## 2014-03-05 MED ORDER — HEPARIN SODIUM (PORCINE) 1000 UNIT/ML DIALYSIS
20.0000 [IU]/kg | INTRAMUSCULAR | Status: DC | PRN
  Administered 2014-03-10 – 2014-03-15 (×3): 4100 [IU] via INTRAVENOUS_CENTRAL
  Filled 2014-03-05: qty 5

## 2014-03-05 NOTE — Progress Notes (Signed)
Physical Therapy Treatment Patient Details Name: Douglas [name] MRN: [id number] DOB: 1956-03-21 Today's Date: 03/05/2014    History of Present Illness 58 yo M with pmh of follicular low-grade non-Hodgkin's lymphoma with large left groin lymphadenopathy diagnosed in 2009 with recent chemotherapy and admission for AKI 3/17-3/20 who presented as a transfer from [place] with 1.5 week history of shortness of breath found to be volume overloaded with hyperkalemia and acute renal failure requiring emergent HD.      PT Comments    Patient with improved tolerance to transfers up to stand over last session, but did not attempt OOB today due to needs to be in sky lift room due to patient reports poor tolerance of lift pad on maximove yesterday.  Still feel patient appropriate for inpatient rehab   Follow Up Recommendations  CIR;Supervision/Assistance - 24 hour     Equipment Recommendations  Rolling walker with 5" wheels    Recommendations for Other Services       Precautions / Restrictions Precautions Precautions: Fall Precaution Comments: 454#. LLE lymphedema, wound VAC    Mobility  Bed Mobility Overal bed mobility: Needs Assistance Bed Mobility: Rolling Rolling: Min assist   Supine to sit: Mod assist Sit to supine: Mod assist;+2 for safety/equipment   General bed mobility comments: use of rail for rolling and side to sit with mod assist for left LE off bed and to scoot hips out; to supine assist for left leg in bed and cues/guiding assist of another for upperbody management  Transfers Overall transfer level: Needs assistance   Transfers: Lateral/Scoot Transfers Sit to Stand: Mod assist;+2 physical assistance;+2 safety/equipment        Lateral/Scoot Transfers: Mod assist;+2 physical assistance General transfer comment: sit<>stand x 2; then assist to scoot up towards head of bed with lateral pivots x 2; pulled up on bars on either side of bari chair  Ambulation/Gait                 Stairs            Wheelchair Mobility    Modified Rankin (Stroke Patients Only)       Balance     Sitting balance-Leahy Scale: Poor Sitting balance - Comments: when at edge of bed holding onto poles on barichair able to balance without posterior bias; when leaning back on hands sitting in bed has posterior bias; c/o back pain with prolonged unsupported sitting Postural control: Posterior lean Standing balance support: Bilateral upper extremity supported Standing balance-Leahy Scale: Poor Standing balance comment: stood max of about 30 seconds first attempt with lateral weight shifts, second attempt about 20 seconds                    Cognition Arousal/Alertness: Awake/alert Behavior During Therapy: WFL for tasks assessed/performed Overall Cognitive Status: Within Functional Limits for tasks assessed                      Exercises General Exercises - Lower Extremity Short Arc Quad: AROM;Left;10 reps;Supine Heel Slides: AAROM;Left;10 reps;Supine Hip ABduction/ADduction: AAROM;10 reps;Left;Supine Straight Leg Raises: AAROM;Left;10 reps;Supine    General Comments        Pertinent Vitals/Pain Min c/o pain left LE with movement    Home Living                      Prior Function            PT Goals (current goals can now be  found in the care plan section) Progress towards PT goals: Progressing toward goals    Frequency  Min 3X/week    PT Plan Current plan remains appropriate    Co-evaluation             End of Session   Activity Tolerance: Patient tolerated treatment well Patient left: in bed;with call bell/phone within reach     Time: 1310-1348 PT Time Calculation (min): 38 min  Charges:  $Therapeutic Exercise: 8-22 mins $Therapeutic Activity: 23-37 mins                    G Codes:      Max Sane Mar 22, 2014, 3:34 PM Magda Kiel, [place] 22-Mar-2014

## 2014-03-05 NOTE — Procedures (Signed)
I was present at this dialysis session. I have reviewed the session itself and made appropriate changes.   No meaingful UOP.  [name]  MD 03/05/2014, 5:28 PM

## 2014-03-05 NOTE — Progress Notes (Signed)
[facility] PROGRESS NOTE  Douglas [name] OIZ:[id number] DOB: 11-27-1955 DOA: 02/24/2014 PCP: [name], MD Brief interim history  Patient is a 58 year old male with history of follicular low-grade non-Hodgkin lymphoma, large left groin lymphadenopathy, diagnosed in 2000 and was on recent chemotherapy, had admission for acute renal failure 3/17- 3/20. He was suspected to have acute renal failure de to HCTZ and ARB.  He had outpatient left-sided inguinal node biopsy after which he has developed a large lymph filled seroma on the left groin requiring wound VAC placement and continuous drainage, he continues to drain about liter liter and a half on a daily basis, there were some superinfection which is responding well to antibiotics which are presently on board. Have discussed his case multiple times with general surgery who do not recommend any further surgical intervention. Once he has a rehabilitation bed he could be placed in a rehabilitation as he requires intermittent IV fluid boluses for ongoing fluid loss due to daily lymph drainage from the wound VAC. SIGNIFICANT EVENTS / STUDIES:  4/3 Hyperkalemia, hyperuricemia, hyperphosphatemia, with ARF requiring emergent CRRT. UA with urate crystals. Renal US normal. No pulmonary edema on CXR. Heparin gtt started due to concern for PE. Started IV vanc for left groin wound.  4/4 ARF thought to be due to ATN from severe sepsis, per oncology not tumor lysis syndrome. No DVT, heparin gtt stopped. 2D-echo normal.  4/5 Continue CRRT, to transition to HD, no renal recovery yet. Hyperuricemia resolved. Dialysis access is a right IJ dialysis catheter  4/6 .Wound vac placed  4/7 -hemodialysis initiated  Assessment/Plan:  Acute kidney injury-potentially related to tumor lysis, :  - Etiology prerenal from volume loss from her fistula versus severe sepsis from left groin infection. Per Oncology tumor lysis syndrome less likely. -dialysis per renal on a  3 times a week basis.  NHL (non-Hodgkin's lymphoma) with follicular low-grade, large left groin lymph adenopathy status post chemotherapy, hyperuricemia  Hyperuricemia has resolved, oncology following (Dr.Magrinat), stable G6PD, LDH levels are around 600 which is twice normal, transfuse if hemoglobin less than 7, currently stable. CT abdomen for restaging outpatient home along with outpatient followup with oncology post discharge.  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 Hulen Skains on 02/24/2014, patient to be discharged with home health and wound VAC, will follow with Dr. Barkley Bruns in the office post discharge.  -Noted wound growing gram-negative rods was switched to doxycycline + cipro on 02/25/2014 and monitor temperature curve, leukocytosis improving. Responding well to antibiotics continue for a total of 10 days stop date 03/07/2014.  -pt afebrile, follow recheck wbc Intermittent tachycardic and hypotension  Likely due to fluid removal during HD, along with ongoing fluid loss from his left inguinal drain which is draining between 1-1/2-2 L a day, will supplement with IV fluids as needed, have discussed with surgery, they have no other solution then to continue drainage this time. Per Dr. Barkley Bruns and Hulen Skains no further surgical intervention is required.  He was on steroids for hypotension while in the ICU which have been tapered off .  Type 2 diabetes mellitus  - Continue sliding scale insulin Abdominal discomfort  -Monitor and if recurrent will further evaluate -Continue supportive care for now and follow CBG (last 3)   Recent Labs   03/02/14 0412  03/02/14 0759  03/02/14 1218   GLUCAP  140*  138*  165*    GERD with history of GI bleed: Continue PPI  Deconditioning Pt will need SNF on admission -SW assisting with placement DVT Prophylaxis: Heparin subcutaneous  Code Status: Full CODE STATUS  Family  Communication:  Disposition: Rehabilitation  Consultants:  Surgery  Nephrology  Oncology     Antibiotics:  Doxycycline  Cipro  HPI/Subjective: Complaining of some mid abdominal discomfort, denies any nausea or vomiting  Objective: Filed Vitals:   03/05/14 1803  BP: 89/53  Pulse: 105  Temp:   Resp: 16    Intake/Output Summary (Last 24 hours) at 03/05/14 1817 Last data filed at 03/05/14 1600  Gross per 24 hour  Intake   1020 ml  Output    927 ml  Net     93 ml   Filed Weights   03/04/14 2030  Weight: 206 kg (454 lb 2.4 oz)    Exam:  General: alert & oriented x 3 In NAD,Morbidly obese  Cardiovascular: RRR, nl S1 s2 Respiratory: Decreased breath sounds at the bases, otherwise clear  Abdomen: soft +BS mild tenderness over mid abdominal area /ND, no masses palpable Extremities: +edema, left groin with wound status post debridement with wound VAC    Data Reviewed: Basic Metabolic Panel:  Recent Labs Lab 02/28/14 0640 03/01/14 0601 03/03/14 0905 03/04/14 0550 03/05/14 0540  NA 127* 126* 129* 131* 128*  K 4.3 5.0 4.9 4.8 5.4*  CL 86* 83* 84* 85* 84*  CO2 17* 17* 16* 15* 16*  GLUCOSE 127* 107* 101* 104* 107*  BUN 51* 66* 73* 58* 70*  CREATININE 5.24* 6.26* 6.70* 5.57* 6.64*  CALCIUM 9.4 9.7 9.4 9.2 9.3  PHOS 5.0* 6.6* 7.2* 6.8* 8.1*   Liver Function Tests:  Recent Labs Lab 02/28/14 0640 03/01/14 0601 03/03/14 0905 03/04/14 0550 03/05/14 0540  ALBUMIN 2.1* 2.1* 2.0* 2.0* 2.0*   No results found for this basename: LIPASE, AMYLASE,  in the last 168 hours No results found for this basename: AMMONIA,  in the last 168 hours CBC:  Recent Labs Lab 03/01/14 0601 03/02/14 0535 03/03/14 0905 03/04/14 0550 03/05/14 0500  WBC 10.6* 10.4 11.0* 11.5* 11.6*  NEUTROABS 8.2* 8.3* 8.3* 8.6* 8.7*  HGB 8.0* 7.9* 8.0* 7.8* 7.6*  HCT 23.5* 23.5* 23.5* 23.7* 23.2*  MCV 83.9 84.2 83.6 84.3 83.8  PLT 167 159 147* 142* 131*   Cardiac Enzymes: No results  found for this basename: CKTOTAL, CKMB, CKMBINDEX, TROPONINI,  in the last 168 hours BNP (last 3 results)  Recent Labs  03/08/2014 1220  PROBNP 937.1*   CBG:  Recent Labs Lab 03/04/14 2358 03/05/14 0418 03/05/14 0820 03/05/14 1141 03/05/14 1624  GLUCAP 111* 116* 98 113* 124*    No results found for this or any previous visit (from the past 240 hour(s)).   Studies: No results found.  Scheduled Meds: . bisacodyl  10 mg Rectal Daily  . ciprofloxacin  500 mg Oral Q breakfast  . docusate sodium  200 mg Oral BID  . doxycycline  100 mg Oral Q12H  . heparin subcutaneous  5,000 Units Subcutaneous 3 times per day  . insulin aspart  0-9 Units Subcutaneous 6 times per day  . multivitamin  1 tablet Oral QHS  . pantoprazole  40 mg Oral Q1200  . polyethylene glycol  17 g Oral Daily  . sodium bicarbonate  1,300 mg Oral TID  . sodium chloride  3 mL Intravenous Q12H   Continuous Infusions:   Active Problems:   NHL (non-Hodgkin's lymphoma)   HTN (hypertension)   Dyslipidemia   Anemia of chronic disease  Morbid obesity   Lower GI bleed   Diabetes mellitus   Tumor lysis syndrome, possibility of   Uremia   SOB (shortness of breath) DDX = CHF vs. PE   Acidosis    Time spent: [facility] Pager [phone]. If 7PM-7AM, please contact night-coverage at [email or web address], password [facility] 03/05/2014, 6:17 PM  LOS: 14 days

## 2014-03-05 NOTE — Consult Note (Signed)
WOC wound follow up Wound type: surgical with lymphatic leak Measurement: see previous note this week Wound VWP:VXYIA,165% granulation tissue,  however when I palpate the groin at the 12 o'clock position of the open wound I am able to express at least 25 cc of cloudy, yellow, lymphatic fluid. The tunneled area at 2 o'clock is almost closed. The tunneled area that has the leak seems to be less deep today at 8cm.    Drainage (amount, consistency, odor) the canister in the room currently does appear to be almost full with yellow, lymphotic fluid  Periwound: itntact Dressing procedure/placement/frequency: I have decreased the VAC dressings to two times a week as this is being used more for control of this leak than closure of this wound at this time.  He did well with 2x wk dressing changes this week will no ill effects.  Less trauma to his skin to change 2x wk.   Filled tunneled area at 9 oclock with 1pc of white foam then use 1pc of black foam to fill the remainder of the wound bed. Skin protected distal from the wound bed and a bridge placed to the upper thigh.  Pt tolerated well.  Supplies ordered for nurse (canister to be changed soon) and for next week dressing change.  [place], [place]

## 2014-03-06 LAB — CBC WITH DIFFERENTIAL/PLATELET
BASOS ABS: 0 10*3/uL (ref 0.0–0.1)
BASOS PCT: 0 % (ref 0–1)
EOS ABS: 0.1 10*3/uL (ref 0.0–0.7)
Eosinophils Relative: 1 % (ref 0–5)
HCT: 22.4 % — ABNORMAL LOW (ref 39.0–52.0)
HEMOGLOBIN: 7.4 g/dL — AB (ref 13.0–17.0)
LYMPHS PCT: 13 % (ref 12–46)
Lymphs Abs: 1.4 10*3/uL (ref 0.7–4.0)
MCH: 27.8 pg (ref 26.0–34.0)
MCHC: 33 g/dL (ref 30.0–36.0)
MCV: 84.2 fL (ref 78.0–100.0)
Monocytes Absolute: 1.2 10*3/uL — ABNORMAL HIGH (ref 0.1–1.0)
Monocytes Relative: 11 % (ref 3–12)
Neutro Abs: 7.9 10*3/uL — ABNORMAL HIGH (ref 1.7–7.7)
Neutrophils Relative %: 75 % (ref 43–77)
Platelets: 123 10*3/uL — ABNORMAL LOW (ref 150–400)
RBC: 2.66 MIL/uL — ABNORMAL LOW (ref 4.22–5.81)
RDW: 18.8 % — AB (ref 11.5–15.5)
WBC: 10.6 10*3/uL — ABNORMAL HIGH (ref 4.0–10.5)

## 2014-03-06 LAB — GLUCOSE, CAPILLARY
GLUCOSE-CAPILLARY: 140 mg/dL — AB (ref 70–99)
GLUCOSE-CAPILLARY: 144 mg/dL — AB (ref 70–99)
Glucose-Capillary: 129 mg/dL — ABNORMAL HIGH (ref 70–99)
Glucose-Capillary: 139 mg/dL — ABNORMAL HIGH (ref 70–99)
Glucose-Capillary: 158 mg/dL — ABNORMAL HIGH (ref 70–99)

## 2014-03-06 LAB — RENAL FUNCTION PANEL
Albumin: 1.9 g/dL — ABNORMAL LOW (ref 3.5–5.2)
BUN: 43 mg/dL — ABNORMAL HIGH (ref 6–23)
CALCIUM: 8.8 mg/dL (ref 8.4–10.5)
CO2: 18 mEq/L — ABNORMAL LOW (ref 19–32)
Chloride: 89 mEq/L — ABNORMAL LOW (ref 96–112)
Creatinine, Ser: 4.47 mg/dL — ABNORMAL HIGH (ref 0.50–1.35)
GFR calc Af Amer: 16 mL/min — ABNORMAL LOW (ref >90)
GFR, EST NON AFRICAN AMERICAN: 13 mL/min — AB (ref >90)
Glucose, Bld: 136 mg/dL — ABNORMAL HIGH (ref 70–99)
PHOSPHORUS: 5.9 mg/dL — AB (ref 2.3–4.6)
Potassium: 4.5 mEq/L (ref 3.7–5.3)
SODIUM: 133 meq/L — AB (ref 137–147)

## 2014-03-06 NOTE — Progress Notes (Signed)
[facility] PROGRESS NOTE  Douglas [name] FAO:[id number] DOB: August 27, 1956 DOA: 03/17/2014 PCP: [name], MD Brief interim history  Patient is a 58 year old male with history of follicular low-grade non-Hodgkin lymphoma, large left groin lymphadenopathy, diagnosed in 2000 and was on recent chemotherapy, had admission for acute renal failure 3/17- 3/20. He was suspected to have acute renal failure de to HCTZ and ARB.  He had outpatient left-sided inguinal node biopsy after which he has developed a large lymph filled seroma on the left groin requiring wound VAC placement and continuous drainage, he continues to drain about liter liter and a half on a daily basis, there were some superinfection which is responding well to antibiotics which are presently on board. Have discussed his case multiple times with general surgery who do not recommend any further surgical intervention. Once he has a rehabilitation bed he could be placed in a rehabilitation as he requires intermittent IV fluid boluses for ongoing fluid loss due to daily lymph drainage from the wound VAC. SIGNIFICANT EVENTS / STUDIES:  4/3 Hyperkalemia, hyperuricemia, hyperphosphatemia, with ARF requiring emergent CRRT. UA with urate crystals. Renal US normal. No pulmonary edema on CXR. Heparin gtt started due to concern for PE. Started IV vanc for left groin wound.  4/4 ARF thought to be due to ATN from severe sepsis, per oncology not tumor lysis syndrome. No DVT, heparin gtt stopped. 2D-echo normal.  4/5 Continue CRRT, to transition to HD, no renal recovery yet. Hyperuricemia resolved. Dialysis access is a right IJ dialysis catheter  4/6 .Wound vac placed  4/7 -hemodialysis initiated  Assessment/Plan:  Acute kidney injury-potentially related to tumor lysis, :  - Etiology prerenal from volume loss from her fistula versus severe sepsis from left groin infection. Per Oncology tumor lysis syndrome less likely. -dialysis per renal, on  a 3 times a week basis.  NHL (non-Hodgkin's lymphoma) with follicular low-grade, large left groin lymph adenopathy status post chemotherapy, hyperuricemia  Hyperuricemia has resolved, oncology following (Dr.Magrinat), stable G6PD, LDH levels are around 600 which is twice normal, transfuse if hemoglobin less than 7, currently stable. CT abdomen for restaging outpatient home along with outpatient followup with oncology post discharge.  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 Hulen Skains on 02/24/2014, patient to be discharged with home health and wound VAC, will follow with Dr. Barkley Bruns in the office post discharge.  -Noted wound growing gram-negative rods was switched to doxycycline + cipro on 02/25/2014 and monitor temperature curve, leukocytosis improving. Responding well to antibiotics continue for a total of 10 days stop date 03/07/2014.  -pt afebrile, follow recheck wbc Intermittent tachycardic and hypotension  Likely due to fluid removal during HD, along with ongoing fluid loss from his left inguinal drain which is draining between 1-1/2-2 L a day, will supplement with IV fluids as needed, have discussed with surgery, they have no other solution then to continue drainage this time. Per Dr. Barkley Bruns and Hulen Skains no further surgical intervention is required.  He was on steroids for hypotension while in the ICU which have been tapered off .  Type 2 diabetes mellitus  - Continue sliding scale insulin Abdominal discomfort  -Unclear etiology, resolved CBG (last 3)   Recent Labs   03/02/14 0412  03/02/14 0759  03/02/14 1218   GLUCAP  140*  138*  165*    GERD with history of GI bleed: Continue PPI  Deconditioning Pt will need SNF on admission -SW assisting with  placement DVT Prophylaxis: Heparin subcutaneous  Code Status: Full CODE STATUS  Family Communication:  Disposition: Rehabilitation  Consultants:  Surgery   Nephrology  Oncology     Antibiotics:  Doxycycline  Cipro  HPI/Subjective: Denies any further abdominal pain, no nausea or vomiting.  Objective: Filed Vitals:   03/06/14 1300  BP: 84/45  Pulse: 113  Temp: 98.8 F (37.1 C)  Resp: 22    Intake/Output Summary (Last 24 hours) at 03/06/14 1720 Last data filed at 03/06/14 1638  Gross per 24 hour  Intake   1740 ml  Output   2750 ml  Net  -1010 ml   Filed Weights   03/05/14 1653 03/05/14 2121 03/05/14 2156  Weight: 214 kg (471 lb 12.6 oz) 211.7 kg (466 lb 11.4 oz) 216 kg (476 lb 3.1 oz)    Exam:  General: alert & oriented x 3 In NAD,Morbidly obese  Cardiovascular: RRR, nl S1 s2 Respiratory: Decreased breath sounds at the bases, otherwise clear  Abdomen: soft +BS mild tenderness over mid abdominal area /ND, no masses palpable Extremities: +edema, left groin with wound status post debridement with wound VAC    Data Reviewed: Basic Metabolic Panel:  Recent Labs Lab 03/01/14 0601 03/03/14 0905 03/04/14 0550 03/05/14 0540 03/06/14 0446  NA 126* 129* 131* 128* 133*  K 5.0 4.9 4.8 5.4* 4.5  CL 83* 84* 85* 84* 89*  CO2 17* 16* 15* 16* 18*  GLUCOSE 107* 101* 104* 107* 136*  BUN 66* 73* 58* 70* 43*  CREATININE 6.26* 6.70* 5.57* 6.64* 4.47*  CALCIUM 9.7 9.4 9.2 9.3 8.8  PHOS 6.6* 7.2* 6.8* 8.1* 5.9*   Liver Function Tests:  Recent Labs Lab 03/01/14 0601 03/03/14 0905 03/04/14 0550 03/05/14 0540 03/06/14 0446  ALBUMIN 2.1* 2.0* 2.0* 2.0* 1.9*   No results found for this basename: LIPASE, AMYLASE,  in the last 168 hours No results found for this basename: AMMONIA,  in the last 168 hours CBC:  Recent Labs Lab 03/02/14 0535 03/03/14 0905 03/04/14 0550 03/05/14 0500 03/06/14 0445  WBC 10.4 11.0* 11.5* 11.6* 10.6*  NEUTROABS 8.3* 8.3* 8.6* 8.7* 7.9*  HGB 7.9* 8.0* 7.8* 7.6* 7.4*  HCT 23.5* 23.5* 23.7* 23.2* 22.4*  MCV 84.2 83.6 84.3 83.8 84.2  PLT 159 147* 142* 131* 123*   Cardiac Enzymes: No  results found for this basename: CKTOTAL, CKMB, CKMBINDEX, TROPONINI,  in the last 168 hours BNP (last 3 results)  Recent Labs  02-25-2014 1220  PROBNP 937.1*   CBG:  Recent Labs Lab 03/06/14 0001 03/06/14 0407 03/06/14 0755 03/06/14 1157 03/06/14 1527  GLUCAP 144* 140* 129* 139* 158*    No results found for this or any previous visit (from the past 240 hour(s)).   Studies: No results found.  Scheduled Meds: . bisacodyl  10 mg Rectal Daily  . ciprofloxacin  500 mg Oral Q breakfast  . docusate sodium  200 mg Oral BID  . doxycycline  100 mg Oral Q12H  . heparin subcutaneous  5,000 Units Subcutaneous 3 times per day  . insulin aspart  0-9 Units Subcutaneous 6 times per day  . multivitamin  1 tablet Oral QHS  . pantoprazole  40 mg Oral Q1200  . polyethylene glycol  17 g Oral Daily  . sodium bicarbonate  1,300 mg Oral TID  . sodium chloride  3 mL Intravenous Q12H   Continuous Infusions:   Active Problems:   NHL (non-Hodgkin's lymphoma)   HTN (hypertension)   Dyslipidemia   Anemia of  chronic disease   Morbid obesity   Lower GI bleed   Diabetes mellitus   Tumor lysis syndrome, possibility of   Uremia   SOB (shortness of breath) DDX = CHF vs. PE   Acidosis    Time spent: [place] Pager [phone]. If 7PM-7AM, please contact night-coverage at [email or web address], password [facility] 03/06/2014, 5:20 PM  LOS: 15 days

## 2014-03-06 NOTE — Progress Notes (Signed)
Admit: 03/15/14 LOS: 25  80M actively treated for NHL with dialysis dependent AKI (since 02/23/14), likely ATN  Subjective:  HD yesterday 2.1L UF No UOP Has nontunneled R IJ HD Cath  04/17 0701 - 04/18 0700 In: 1680 [P.O.:1680] Out: 2851 [Urine:50; Drains:700; Stool:1]  Filed Weights   03/05/14 1653 03/05/14 2121 03/05/14 2156  Weight: 214 kg (471 lb 12.6 oz) 211.7 kg (466 lb 11.4 oz) 216 kg (476 lb 3.1 oz)    Current meds: reviewed  Current Labs: reviewed    Physical Exam:  Blood pressure 104/61, pulse 97, temperature 97.9 F (36.6 C), temperature source Oral, resp. rate 23, height 5' 10.08" (1.78 m), weight 216 kg (476 lb 3.1 oz), SpO2 91.00%. Obese, in bed R IJ temp cath in place  RRR, no rub CTAB on ant auscultation L>R LEE, 2+ Nonfocal AAOx3 EOMI, NCAT  Assessment 1. Dialysis dependent AKI, likely from ATN. RRT since 02/23/14 2. NHL s/p therapy 02/03/14 3. 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 Mild hyponatremia, stable, mild, ASx 5. Morbid obesity 6. DM2 7. Anemia  Plan 1. Watch over weekend, if no improvement or suggestion of recovery talk with [facility] for [facility] early next week. 2. Strict I/Os as best able 3. Cont fluid restriction 4. Daily renal panel, still hopeful for recovery of GFR.  5. Given tx of NHL with curative intent, defer use of ESA to Heme/Onc  [name] MD 03/06/2014, 10:45 AM   Recent Labs Lab 03/04/14 0550 03/05/14 0540 03/06/14 0446  NA 131* 128* 133*  K 4.8 5.4* 4.5  CL 85* 84* 89*  CO2 15* 16* 18*  GLUCOSE 104* 107* 136*  BUN 58* 70* 43*  CREATININE 5.57* 6.64* 4.47*  CALCIUM 9.2 9.3 8.8  PHOS 6.8* 8.1* 5.9*    Recent Labs Lab 03/04/14 0550 03/05/14 0500 03/06/14 0445  WBC 11.5* 11.6* 10.6*  NEUTROABS 8.6* 8.7* 7.9*  HGB 7.8* 7.6* 7.4*  HCT 23.7* 23.2* 22.4*  MCV 84.3 83.8 84.2  PLT 142* 131* 123*

## 2014-03-06 NOTE — Progress Notes (Signed)
Pt out of bed to chair via skylift. Pt able to tolerate chair for lunch, 1230 until 4pm. Assisted back to bed via lift. Will continue to monitor.

## 2014-03-07 DIAGNOSIS — N186 End stage renal disease: Secondary | ICD-10-CM

## 2014-03-07 LAB — GLUCOSE, CAPILLARY
GLUCOSE-CAPILLARY: 109 mg/dL — AB (ref 70–99)
GLUCOSE-CAPILLARY: 121 mg/dL — AB (ref 70–99)
GLUCOSE-CAPILLARY: 121 mg/dL — AB (ref 70–99)
Glucose-Capillary: 109 mg/dL — ABNORMAL HIGH (ref 70–99)
Glucose-Capillary: 113 mg/dL — ABNORMAL HIGH (ref 70–99)
Glucose-Capillary: 129 mg/dL — ABNORMAL HIGH (ref 70–99)
Glucose-Capillary: 134 mg/dL — ABNORMAL HIGH (ref 70–99)

## 2014-03-07 LAB — CBC WITH DIFFERENTIAL/PLATELET
BASOS ABS: 0 10*3/uL (ref 0.0–0.1)
Basophils Relative: 0 % (ref 0–1)
Eosinophils Absolute: 0.1 10*3/uL (ref 0.0–0.7)
Eosinophils Relative: 1 % (ref 0–5)
HCT: 23.6 % — ABNORMAL LOW (ref 39.0–52.0)
Hemoglobin: 7.7 g/dL — ABNORMAL LOW (ref 13.0–17.0)
LYMPHS ABS: 1.3 10*3/uL (ref 0.7–4.0)
Lymphocytes Relative: 12 % (ref 12–46)
MCH: 28 pg (ref 26.0–34.0)
MCHC: 32.6 g/dL (ref 30.0–36.0)
MCV: 85.8 fL (ref 78.0–100.0)
MONO ABS: 1.7 10*3/uL — AB (ref 0.1–1.0)
Monocytes Relative: 15 % — ABNORMAL HIGH (ref 3–12)
NEUTROS ABS: 8 10*3/uL — AB (ref 1.7–7.7)
Neutrophils Relative %: 72 % (ref 43–77)
Platelets: 131 10*3/uL — ABNORMAL LOW (ref 150–400)
RBC: 2.75 MIL/uL — ABNORMAL LOW (ref 4.22–5.81)
RDW: 19.1 % — AB (ref 11.5–15.5)
WBC: 11.1 10*3/uL — AB (ref 4.0–10.5)

## 2014-03-07 LAB — RENAL FUNCTION PANEL
ALBUMIN: 2 g/dL — AB (ref 3.5–5.2)
BUN: 56 mg/dL — AB (ref 6–23)
CO2: 15 mEq/L — ABNORMAL LOW (ref 19–32)
Calcium: 9.3 mg/dL (ref 8.4–10.5)
Chloride: 85 mEq/L — ABNORMAL LOW (ref 96–112)
Creatinine, Ser: 5.49 mg/dL — ABNORMAL HIGH (ref 0.50–1.35)
GFR calc Af Amer: 12 mL/min — ABNORMAL LOW (ref >90)
GFR calc non Af Amer: 10 mL/min — ABNORMAL LOW (ref >90)
GLUCOSE: 111 mg/dL — AB (ref 70–99)
PHOSPHORUS: 6.6 mg/dL — AB (ref 2.3–4.6)
Potassium: 4.8 mEq/L (ref 3.7–5.3)
Sodium: 129 mEq/L — ABNORMAL LOW (ref 137–147)

## 2014-03-07 MED ORDER — CEFAZOLIN SODIUM 1-5 GM-% IV SOLN: 1.0000 g | INTRAVENOUS | Status: DC

## 2014-03-07 MED ORDER — GUAIFENESIN-DM 100-10 MG/5ML PO SYRP
5.0000 mL | ORAL_SOLUTION | ORAL | Status: DC | PRN
  Administered 2014-03-07 – 2014-03-20 (×13): 5 mL via ORAL
  Filled 2014-03-07 (×15): qty 5

## 2014-03-07 MED ORDER — DEXTROSE 5 % IV SOLN
3.0000 g | INTRAVENOUS | Status: AC
  Administered 2014-03-08: 1 g via INTRAVENOUS
  Administered 2014-03-08: 2 g via INTRAVENOUS
  Filled 2014-03-07 (×2): qty 3000

## 2014-03-07 NOTE — Progress Notes (Signed)
Admit: 03/15/2014 LOS: 51  27M actively treated for NHL with dialysis dependent AKI (since 02/23/14), likely ATN  Subjective:  No HD in 2d BUN and SCr cont to increase  No suggestion of UOP, infrequent  Discussed hwo renal failure complicates ability to provide many chemotherapeutics  04/18 0701 - 04/19 0700 In: 2100 [P.O.:2100] Out: 440 [Drains:440]  Filed Weights   03/05/14 1653 03/05/14 2121 03/05/14 2156  Weight: 214 kg (471 lb 12.6 oz) 211.7 kg (466 lb 11.4 oz) 216 kg (476 lb 3.1 oz)    Current meds: reviewed  Current Labs: reviewed    Physical Exam:  Blood pressure 86/44, pulse 101, temperature 97.8 F (36.6 C), temperature source Oral, resp. rate 18, height 5' 10.08" (1.78 m), weight 216 kg (476 lb 3.1 oz), SpO2 99.00%. Obese, in bed R IJ temp cath in place  RRR, no rub CTAB on ant auscultation L>R LEE, 2+ Nonfocal AAOx3 EOMI, NCAT  Assessment 1. Dialysis dependent AKI, likely from ATN. RRT since 02/23/14 -- no suggestion of recovery to date 2. NHL s/p therapy 02/03/14 (Bendamustine, Rituximab) 3. 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 Mild hyponatremia, stable, mild, ASx 5. Morbid obesity 6. DM2 7. Anemia  Plan 1. No evidence of recovery.  Will review AM labs and provide iHD if not suggestive of recovery 2. Hav asked VVS to eva lfor TDC at this time.  Would want to define goals of care before proceeding with more permanent access 3. Strict I/Os as best able 4. Cont fluid restriction 5. Daily renal panel, still hopeful for recovery of GFR.  6. Given tx of NHL with curative intent, defer use of ESA to Heme/Onc  [name] MD 03/07/2014, 12:07 PM   Recent Labs Lab 03/05/14 0540 03/06/14 0446 03/07/14 0653  NA 128* 133* 129*  K 5.4* 4.5 4.8  CL 84* 89* 85*  CO2 16* 18* 15*  GLUCOSE 107* 136* 111*  BUN 70* 43* 56*  CREATININE 6.64* 4.47* 5.49*  CALCIUM 9.3 8.8 9.3  PHOS 8.1* 5.9* 6.6*    Recent Labs Lab  03/05/14 0500 03/06/14 0445 03/07/14 0653  WBC 11.6* 10.6* 11.1*  NEUTROABS 8.7* 7.9* 8.0*  HGB 7.6* 7.4* 7.7*  HCT 23.2* 22.4* 23.6*  MCV 83.8 84.2 85.8  PLT 131* 123* 131*

## 2014-03-07 NOTE — Progress Notes (Signed)
[facility] PROGRESS NOTE  Douglas [name] RXV:[id number] DOB: Apr 02, 1956 DOA: 03/01/2014 PCP: [name], MD Brief interim history  Patient is a 58 year old male with history of follicular low-grade non-Hodgkin lymphoma, large left groin lymphadenopathy, diagnosed in 2000 and was on recent chemotherapy, had admission for acute renal failure 3/17- 3/20. He was suspected to have acute renal failure de to HCTZ and ARB.  He had outpatient left-sided inguinal node biopsy after which he has developed a large lymph filled seroma on the left groin requiring wound VAC placement and continuous drainage, he continues to drain about liter liter and a half on a daily basis, there were some superinfection which is responding well to antibiotics which are presently on board. Have discussed his case multiple times with general surgery who do not recommend any further surgical intervention. Once he has a rehabilitation bed he could be placed in a rehabilitation as he requires intermittent IV fluid boluses for ongoing fluid loss due to daily lymph drainage from the wound VAC. SIGNIFICANT EVENTS / STUDIES:  4/3 Hyperkalemia, hyperuricemia, hyperphosphatemia, with ARF requiring emergent CRRT. UA with urate crystals. Renal US normal. No pulmonary edema on CXR. Heparin gtt started due to concern for PE. Started IV vanc for left groin wound.  4/4 ARF thought to be due to ATN from severe sepsis, per oncology not tumor lysis syndrome. No DVT, heparin gtt stopped. 2D-echo normal.  4/5 Continue CRRT, to transition to HD, no renal recovery yet. Hyperuricemia resolved. Dialysis access is a right IJ dialysis catheter  4/6 .Wound vac placed  4/7 -hemodialysis initiated  Assessment/Plan:  Acute kidney injury-potentially related to tumor lysis, :  - Etiology prerenal from volume loss from her fistula versus severe sepsis from left groin infection. Per Oncology tumor lysis syndrome less likely. -dialysis per renal, on  a 3 times a week basis.  -per renal still hopeful for recovery of GFR NHL (non-Hodgkin's lymphoma) with follicular low-grade, large left groin lymph adenopathy status post chemotherapy, hyperuricemia  Hyperuricemia has resolved, oncology following (Dr.Magrinat), stable G6PD, LDH levels are around 600 which is twice normal, transfuse if hemoglobin less than 7, currently stable. CT abdomen for restaging outpatient home along with outpatient followup with oncology post discharge.  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 Hulen Skains on 02/24/2014, patient to be discharged with home health and wound VAC, will follow with Dr. Barkley Bruns in the office post discharge.  -Noted wound growing gram-negative rods was switched to doxycycline + cipro on 02/25/2014 and monitor temperature curve, leukocytosis improving. Responding well to antibiotics continue for a total of 10 days stop date 03/07/2014.  -pt afebrile,11-unchanged Intermittent tachycardic and hypotension  Likely due to fluid removal during HD, along with ongoing fluid loss from his left inguinal drain which is draining between 1-1/2-2 L a day, will supplement with IV fluids as needed, have discussed with surgery, they have no other solution then to continue drainage this time. Per Dr. Barkley Bruns and Hulen Skains no further surgical intervention is required.  He was on steroids for hypotension while in the ICU which have been tapered off .  Type 2 diabetes mellitus  - Continue sliding scale insulin Abdominal discomfort  -Unclear etiology, resolved CBG (last 3)   Recent Labs   03/02/14 0412  03/02/14 0759  03/02/14 1218   GLUCAP  140*  138*  165*    GERD with history of GI bleed: Continue PPI  Deconditioning Pt will need SNF  on admission -SW assisting with placement DVT Prophylaxis: Heparin subcutaneous  Code Status: Full CODE STATUS  Family Communication:  Disposition:  Rehabilitation  Consultants:  Surgery  Nephrology  Oncology     Antibiotics:  Doxycycline  Cipro  HPI/Subjective: Denies any c/o today  Objective: Filed Vitals:   03/07/14 0922  BP: 86/44  Pulse: 101  Temp: 97.8 F (36.6 C)  Resp: 18    Intake/Output Summary (Last 24 hours) at 03/07/14 1504 Last data filed at 03/07/14 0900  Gross per 24 hour  Intake   1200 ml  Output    390 ml  Net    810 ml   Filed Weights   03/05/14 1653 03/05/14 2121 03/05/14 2156  Weight: 214 kg (471 lb 12.6 oz) 211.7 kg (466 lb 11.4 oz) 216 kg (476 lb 3.1 oz)    Exam:  General: alert & oriented x 3 In NAD,Morbidly obese  Cardiovascular: RRR, nl S1 s2 Respiratory: Decreased breath sounds at the bases, otherwise clear  Abdomen: soft +BS mild tenderness over mid abdominal area /ND, no masses palpable Extremities: +edema, left groin with wound status post debridement with wound VAC    Data Reviewed: Basic Metabolic Panel:  Recent Labs Lab 03/03/14 0905 03/04/14 0550 03/05/14 0540 03/06/14 0446 03/07/14 0653  NA 129* 131* 128* 133* 129*  K 4.9 4.8 5.4* 4.5 4.8  CL 84* 85* 84* 89* 85*  CO2 16* 15* 16* 18* 15*  GLUCOSE 101* 104* 107* 136* 111*  BUN 73* 58* 70* 43* 56*  CREATININE 6.70* 5.57* 6.64* 4.47* 5.49*  CALCIUM 9.4 9.2 9.3 8.8 9.3  PHOS 7.2* 6.8* 8.1* 5.9* 6.6*   Liver Function Tests:  Recent Labs Lab 03/03/14 0905 03/04/14 0550 03/05/14 0540 03/06/14 0446 03/07/14 0653  ALBUMIN 2.0* 2.0* 2.0* 1.9* 2.0*   No results found for this basename: LIPASE, AMYLASE,  in the last 168 hours No results found for this basename: AMMONIA,  in the last 168 hours CBC:  Recent Labs Lab 03/03/14 0905 03/04/14 0550 03/05/14 0500 03/06/14 0445 03/07/14 0653  WBC 11.0* 11.5* 11.6* 10.6* 11.1*  NEUTROABS 8.3* 8.6* 8.7* 7.9* 8.0*  HGB 8.0* 7.8* 7.6* 7.4* 7.7*  HCT 23.5* 23.7* 23.2* 22.4* 23.6*  MCV 83.6 84.3 83.8 84.2 85.8  PLT 147* 142* 131* 123* 131*   Cardiac  Enzymes: No results found for this basename: CKTOTAL, CKMB, CKMBINDEX, TROPONINI,  in the last 168 hours BNP (last 3 results)  Recent Labs  02/24/2014 1220  PROBNP 937.1*   CBG:  Recent Labs Lab 03/06/14 2004 03/07/14 0015 03/07/14 0402 03/07/14 0759 03/07/14 1206  GLUCAP 134* 129* 109* 109* 121*    No results found for this or any previous visit (from the past 240 hour(s)).   Studies: No results found.  Scheduled Meds: . bisacodyl  10 mg Rectal Daily  . ciprofloxacin  500 mg Oral Q breakfast  . docusate sodium  200 mg Oral BID  . doxycycline  100 mg Oral Q12H  . heparin subcutaneous  5,000 Units Subcutaneous 3 times per day  . insulin aspart  0-9 Units Subcutaneous 6 times per day  . multivitamin  1 tablet Oral QHS  . pantoprazole  40 mg Oral Q1200  . polyethylene glycol  17 g Oral Daily  . sodium bicarbonate  1,300 mg Oral TID  . sodium chloride  3 mL Intravenous Q12H   Continuous Infusions:   Active Problems:   NHL (non-Hodgkin's lymphoma)   HTN (hypertension)   Dyslipidemia  Anemia of chronic disease   Morbid obesity   Lower GI bleed   Diabetes mellitus   Tumor lysis syndrome, possibility of   Uremia   SOB (shortness of breath) DDX = CHF vs. PE   Acidosis    Time spent: [place] [facility] Pager [phone]. If 7PM-7AM, please contact night-coverage at [email or web address], password [facility] 03/07/2014, 3:04 PM  LOS: 16 days

## 2014-03-07 NOTE — Consult Note (Signed)
Consult Note  Patient name: Douglas [name] MRN: [id number] DOB: 06/12/1956 Sex: male  Consulting Physician:  Renal  Reason for Consult:  Chief Complaint  Patient presents with  . Shortness of Breath    HISTORY OF PRESENT ILLNESS: 58 year old with acute renal failure from chemotherapy for non-Hodgkin Lymphoma.  He has a alrge lymph filled seroma in left groin from node biopsy.  Wound vac in place.  He is getting dialysis via a right IJ temp cath.  He is medically managed for HTN.  He is a diabetic on SSI in th e hospital  Past Medical History  Diagnosis Date  . Non Hodgkin's lymphoma January 2009  . Diabetes mellitus without complication   . Hypertension   . Sleep apnea   . Lymphadenopathy, inguinal     left    Past Surgical History  Procedure Laterality Date  . Biopsy for lymphoma Left 11/02/13    groin  . Colonoscopy N/A 12/22/2013    Procedure: COLONOSCOPY;  Surgeon: Beryle Beams, MD;  Location: [facility] ENDOSCOPY;  Service: Endoscopy;  Laterality: N/A;  . Esophagogastroduodenoscopy N/A 12/22/2013    Procedure: ESOPHAGOGASTRODUODENOSCOPY (EGD);  Surgeon: Beryle Beams, MD;  Location: [facility];  Service: Endoscopy;  Laterality: N/A;  . Ercp N/A 12/24/2013    Procedure: UPPER ENDOSCOPY;  Surgeon: Beryle Beams, MD;  Location: [facility] ORS;  Service: Gastroenterology;  Laterality: N/A;  . Colonoscopy N/A 12/24/2013    Procedure: COLONOSCOPY;  Surgeon: Beryle Beams, MD;  Location: [facility] ORS;  Service: Gastroenterology;  Laterality: N/A;    History   Social History  . Marital Status: Single    Spouse Name: N/A    Number of Children: N/A  . Years of Education: N/A   Occupational History  . Not on file.   Social History Main Topics  . Smoking status: Never Smoker   . Smokeless tobacco: Never Used  . Alcohol Use: No  . Drug Use: No  . Sexual Activity: Not on file   Other Topics Concern  . 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 CAD Mother 72    Died at age 25  . CAD Father     Allergies as of 02/28/2014  . (No Known Allergies)    No current facility-administered medications on file prior to encounter.   Current Outpatient Prescriptions on File Prior to Encounter  Medication Sig Dispense Refill  . docusate sodium (COLACE) 100 MG capsule Take 100 mg by mouth 2 (two) times daily.      . Ferrous Sulfate (IRON) 325 (65 FE) MG TABS Take 325 mg by mouth 2 (two) times daily.  60 each  0  . omeprazole (PRILOSEC) 40 MG capsule Take 1 capsule (40 mg total) by mouth 2 (two) times daily.  60 capsule  0  . polyethylene glycol (MIRALAX / GLYCOLAX) packet Take 17 g by mouth daily as needed for mild constipation.       . prochlorperazine (COMPAZINE) 10 MG tablet Take 1 tablet (10 mg total) by mouth every 6 (six) hours as needed for nausea or vomiting.  60 tablet  0     REVIEW OF SYSTEMS: Constitutional: +fatigue + weight change .  Respiratory: + dyspnea + hiccups  Cardiovascular: + chest pain + palpitations +  left unilateral swelling  Gastrointestinal: + lower abdomina pain + dysphagia +constipation  Genitourinary: + decreased urine output  Skin: + left inguinal node drainage  All others negative  PHYSICAL EXAMINATION: General: The patient appears their stated age.  Vital signs are BP 116/71  Pulse 108  Temp(Src) 97.5 F (36.4 C) (Oral)  Resp 19  Ht 5' 10.08" (1.78 m)  Wt 476 lb 3.1 oz (216 kg)  BMI 68.17 kg/m2  SpO2 99% Pulmonary: Respirations are non-labored HEENT:  No gross abnormalities Abdomen: Soft and non-tender  Musculoskeletal: There are no major deformities.   Neurologic: No focal weakness or paresthesias are detected, Skin: Left groin wound Psychiatric: The patient has normal affect. Cardiovascular: Tachy, L LE edema   Assessment:  Renal failure Plan: Permanent catheter  tomorrow in the OR.  Discussed with patient and family at bedside     V. Leia Alf, M.D. [facility] Office: [phone] Pager:  [phone]

## 2014-03-07 NOTE — Progress Notes (Signed)
Pt can place on and off CPAP as needed. Pt encouraged to call RT if needing any assistance.

## 2014-03-08 ENCOUNTER — Encounter (HOSPITAL_COMMUNITY): Payer: BC Managed Care – PPO | Admitting: Anesthesiology

## 2014-03-08 ENCOUNTER — Inpatient Hospital Stay (HOSPITAL_COMMUNITY): Payer: BC Managed Care – PPO

## 2014-03-08 ENCOUNTER — Inpatient Hospital Stay (HOSPITAL_COMMUNITY): Payer: BC Managed Care – PPO | Admitting: Anesthesiology

## 2014-03-08 ENCOUNTER — Encounter (HOSPITAL_COMMUNITY): Payer: Self-pay | Admitting: Certified Registered"

## 2014-03-08 ENCOUNTER — Encounter (HOSPITAL_COMMUNITY): Admission: EM | Disposition: E | Payer: Self-pay | Source: Home / Self Care | Attending: Internal Medicine

## 2014-03-08 HISTORY — PX: INSERTION OF DIALYSIS CATHETER: SHX1324

## 2014-03-08 LAB — CBC WITH DIFFERENTIAL/PLATELET
Basophils Absolute: 0 10*3/uL (ref 0.0–0.1)
Basophils Relative: 0 % (ref 0–1)
EOS ABS: 0.1 10*3/uL (ref 0.0–0.7)
Eosinophils Relative: 1 % (ref 0–5)
HCT: 22.8 % — ABNORMAL LOW (ref 39.0–52.0)
HEMOGLOBIN: 7.4 g/dL — AB (ref 13.0–17.0)
LYMPHS PCT: 12 % (ref 12–46)
Lymphs Abs: 1.2 10*3/uL (ref 0.7–4.0)
MCH: 27.8 pg (ref 26.0–34.0)
MCHC: 32.5 g/dL (ref 30.0–36.0)
MCV: 85.7 fL (ref 78.0–100.0)
MONO ABS: 1.4 10*3/uL — AB (ref 0.1–1.0)
Monocytes Relative: 14 % — ABNORMAL HIGH (ref 3–12)
Neutro Abs: 7.6 10*3/uL (ref 1.7–7.7)
Neutrophils Relative %: 73 % (ref 43–77)
PLATELETS: 128 10*3/uL — AB (ref 150–400)
RBC: 2.66 MIL/uL — AB (ref 4.22–5.81)
RDW: 19 % — ABNORMAL HIGH (ref 11.5–15.5)
WBC: 10.3 10*3/uL (ref 4.0–10.5)

## 2014-03-08 LAB — RENAL FUNCTION PANEL
ALBUMIN: 1.9 g/dL — AB (ref 3.5–5.2)
BUN: 69 mg/dL — ABNORMAL HIGH (ref 6–23)
CALCIUM: 9.5 mg/dL (ref 8.4–10.5)
CO2: 15 meq/L — AB (ref 19–32)
CREATININE: 6.39 mg/dL — AB (ref 0.50–1.35)
Chloride: 82 mEq/L — ABNORMAL LOW (ref 96–112)
GFR calc non Af Amer: 9 mL/min — ABNORMAL LOW (ref >90)
GFR, EST AFRICAN AMERICAN: 10 mL/min — AB (ref >90)
Glucose, Bld: 88 mg/dL (ref 70–99)
PHOSPHORUS: 8.5 mg/dL — AB (ref 2.3–4.6)
Potassium: 5.6 mEq/L — ABNORMAL HIGH (ref 3.7–5.3)
SODIUM: 128 meq/L — AB (ref 137–147)

## 2014-03-08 LAB — GLUCOSE, CAPILLARY
GLUCOSE-CAPILLARY: 113 mg/dL — AB (ref 70–99)
GLUCOSE-CAPILLARY: 130 mg/dL — AB (ref 70–99)
Glucose-Capillary: 100 mg/dL — ABNORMAL HIGH (ref 70–99)
Glucose-Capillary: 93 mg/dL (ref 70–99)
Glucose-Capillary: 90 mg/dL (ref 70–99)

## 2014-03-08 SURGERY — INSERTION OF DIALYSIS CATHETER
Anesthesia: Monitor Anesthesia Care | Site: Neck | Laterality: Right

## 2014-03-08 MED ORDER — OXYCODONE HCL 5 MG/5ML PO SOLN: 5.0000 mg | Freq: Once | ORAL | Status: DC | PRN

## 2014-03-08 MED ORDER — HEPARIN SODIUM (PORCINE) 1000 UNIT/ML IJ SOLN
INTRAMUSCULAR | Status: DC | PRN
  Administered 2014-03-08: 4.6 mL

## 2014-03-08 MED ORDER — HEPARIN SODIUM (PORCINE) 1000 UNIT/ML IJ SOLN
INTRAMUSCULAR | Status: AC
  Filled 2014-03-08: qty 1

## 2014-03-08 MED ORDER — NEPRO/CARBSTEADY PO LIQD
237.0000 mL | Freq: Two times a day (BID) | ORAL | Status: DC
  Administered 2014-03-08 – 2014-03-25 (×30): 237 mL via ORAL
  Filled 2014-03-08 (×5): qty 237

## 2014-03-08 MED ORDER — FENTANYL CITRATE 0.05 MG/ML IJ SOLN
INTRAMUSCULAR | Status: AC
  Filled 2014-03-08: qty 5

## 2014-03-08 MED ORDER — MIDAZOLAM HCL 5 MG/5ML IJ SOLN
INTRAMUSCULAR | Status: DC | PRN
  Administered 2014-03-08 (×2): 1 mg via INTRAVENOUS

## 2014-03-08 MED ORDER — OXYCODONE HCL 5 MG PO TABS: 5.0000 mg | ORAL_TABLET | Freq: Once | ORAL | Status: DC | PRN

## 2014-03-08 MED ORDER — HYDROMORPHONE HCL PF 1 MG/ML IJ SOLN: 0.2500 mg | INTRAMUSCULAR | Status: DC | PRN

## 2014-03-08 MED ORDER — LIDOCAINE-EPINEPHRINE (PF) 1 %-1:200000 IJ SOLN
INTRAMUSCULAR | Status: DC | PRN
  Administered 2014-03-08: 30 mL

## 2014-03-08 MED ORDER — CEFAZOLIN SODIUM 1-5 GM-% IV SOLN
INTRAVENOUS | Status: AC
  Filled 2014-03-08: qty 50

## 2014-03-08 MED ORDER — ONDANSETRON HCL 4 MG/2ML IJ SOLN
INTRAMUSCULAR | Status: DC | PRN
  Administered 2014-03-08: 4 mg via INTRAVENOUS

## 2014-03-08 MED ORDER — ONDANSETRON HCL 4 MG/2ML IJ SOLN
INTRAMUSCULAR | Status: AC
  Filled 2014-03-08: qty 2

## 2014-03-08 MED ORDER — PROPOFOL 10 MG/ML IV BOLUS
INTRAVENOUS | Status: AC
  Filled 2014-03-08: qty 20

## 2014-03-08 MED ORDER — MIDAZOLAM HCL 2 MG/2ML IJ SOLN
INTRAMUSCULAR | Status: AC
  Filled 2014-03-08: qty 2

## 2014-03-08 MED ORDER — FENTANYL CITRATE 0.05 MG/ML IJ SOLN
INTRAMUSCULAR | Status: DC | PRN
  Administered 2014-03-08: 50 ug via INTRAVENOUS

## 2014-03-08 MED ORDER — CEFAZOLIN SODIUM-DEXTROSE 2-3 GM-% IV SOLR
INTRAVENOUS | Status: AC
  Filled 2014-03-08: qty 50

## 2014-03-08 MED ORDER — OXYCODONE-ACETAMINOPHEN 5-325 MG PO TABS: 1.0000 | ORAL_TABLET | Freq: Four times a day (QID) | ORAL | Status: DC | PRN

## 2014-03-08 MED ORDER — ALBUMIN HUMAN 25 % IV SOLN
INTRAVENOUS | Status: AC
  Filled 2014-03-08: qty 50

## 2014-03-08 MED ORDER — SODIUM CHLORIDE 0.9 % IV SOLN
INTRAVENOUS | Status: DC
  Administered 2014-03-08 – 2014-03-26 (×2): via INTRAVENOUS

## 2014-03-08 MED ORDER — PROMETHAZINE HCL 25 MG/ML IJ SOLN: 6.2500 mg | INTRAMUSCULAR | Status: DC | PRN

## 2014-03-08 MED ORDER — SODIUM CHLORIDE 0.9 % IR SOLN
Status: DC | PRN
  Administered 2014-03-08: 10:00:00

## 2014-03-08 SURGICAL SUPPLY — 42 items
BAG DECANTER FOR FLEXI CONT (MISCELLANEOUS) ×3 IMPLANT
CATH CANNON HEMO 15F 50CM (CATHETERS) IMPLANT
CATH CANNON HEMO 15FR 19 (HEMODIALYSIS SUPPLIES) IMPLANT
CATH CANNON HEMO 15FR 23CM (HEMODIALYSIS SUPPLIES) ×3 IMPLANT
CATH CANNON HEMO 15FR 31CM (HEMODIALYSIS SUPPLIES) IMPLANT
CATH CANNON HEMO 15FR 32CM (HEMODIALYSIS SUPPLIES) IMPLANT
CATH STRAIGHT 5FR 65CM (CATHETERS) IMPLANT
COVER PROBE W GEL 5X96 (DRAPES) ×3 IMPLANT
COVER SURGICAL LIGHT HANDLE (MISCELLANEOUS) ×3 IMPLANT
DERMABOND ADVANCED (GAUZE/BANDAGES/DRESSINGS)
DERMABOND ADVANCED .7 DNX12 (GAUZE/BANDAGES/DRESSINGS) IMPLANT
DRAPE C-ARM 42X72 X-RAY (DRAPES) ×3 IMPLANT
DRAPE CHEST BREAST 15X10 FENES (DRAPES) ×3 IMPLANT
GAUZE SPONGE 2X2 8PLY STRL LF (GAUZE/BANDAGES/DRESSINGS) ×1 IMPLANT
GAUZE SPONGE 4X4 16PLY XRAY LF (GAUZE/BANDAGES/DRESSINGS) ×3 IMPLANT
GLOVE BIO SURGEON STRL SZ7 (GLOVE) ×6 IMPLANT
GLOVE BIOGEL PI IND STRL 7.5 (GLOVE) ×2 IMPLANT
GLOVE BIOGEL PI INDICATOR 7.5 (GLOVE) ×4
GOWN STRL REUS W/ TWL LRG LVL3 (GOWN DISPOSABLE) ×2 IMPLANT
GOWN STRL REUS W/TWL LRG LVL3 (GOWN DISPOSABLE) ×4
HOVERMATT SINGLE USE (MISCELLANEOUS) ×3 IMPLANT
KIT BASIN OR (CUSTOM PROCEDURE TRAY) ×3 IMPLANT
KIT ROOM TURNOVER OR (KITS) ×3 IMPLANT
NEEDLE 18GX1X1/2 (RX/OR ONLY) (NEEDLE) ×3 IMPLANT
NEEDLE HYPO 25GX1X1/2 BEV (NEEDLE) ×3 IMPLANT
NS IRRIG 1000ML POUR BTL (IV SOLUTION) ×3 IMPLANT
PACK SURGICAL SETUP 50X90 (CUSTOM PROCEDURE TRAY) ×3 IMPLANT
PAD ARMBOARD 7.5X6 YLW CONV (MISCELLANEOUS) ×6 IMPLANT
SET MICROPUNCTURE 5F STIFF (MISCELLANEOUS) IMPLANT
SOAP 2 % CHG 4 OZ (WOUND CARE) ×3 IMPLANT
SPONGE GAUZE 2X2 STER 10/PKG (GAUZE/BANDAGES/DRESSINGS) ×2
SUT ETHILON 3 0 PS 1 (SUTURE) ×3 IMPLANT
SUT MNCRL AB 4-0 PS2 18 (SUTURE) ×3 IMPLANT
SYR 20CC LL (SYRINGE) ×3 IMPLANT
SYR 3ML LL SCALE MARK (SYRINGE) ×3 IMPLANT
SYR 5ML LL (SYRINGE) ×3 IMPLANT
SYR CONTROL 10ML LL (SYRINGE) ×3 IMPLANT
SYRINGE 10CC LL (SYRINGE) ×3 IMPLANT
TOWEL OR 17X24 6PK STRL BLUE (TOWEL DISPOSABLE) ×3 IMPLANT
TOWEL OR 17X26 10 PK STRL BLUE (TOWEL DISPOSABLE) ×3 IMPLANT
WATER STERILE IRR 1000ML POUR (IV SOLUTION) ×3 IMPLANT
WIRE AMPLATZ SS-J .035X180CM (WIRE) IMPLANT

## 2014-03-08 NOTE — Anesthesia Postprocedure Evaluation (Signed)
Anesthesia Post Note  Patient: Douglas [name]  Procedure(s) Performed: Procedure(s) (LRB): Removal of catheter,  INSERTION OF DIALYSIS CATHETER right IJ (Right)  Anesthesia type: MAC  Patient location: PACU  Post pain: Pain level controlled  Post assessment: Patient's Cardiovascular Status Stable  Last Vitals:  Filed Vitals:   2014/03/13 1215  BP:   Pulse: 102  Temp:   Resp: 16    Post vital signs: Reviewed and stable  Level of consciousness: sedated  Complications: No apparent anesthesia complications

## 2014-03-08 NOTE — Progress Notes (Signed)
PT Cancellation Note  Patient Details Name: Douglas [name] MRN: [id number] DOB: 02-17-1956   Cancelled Treatment:    Reason Eval/Treat Not Completed: Patient at procedure or test/unavailable Noted to OR for HS cath insertion; Will follow up for PT tomorrow;  [name], [place] Pager [phone] Office [phone]    [facility] [name] 02/22/2014, 10:49 AM

## 2014-03-08 NOTE — Progress Notes (Signed)
[facility] PROGRESS NOTE  Douglas [name] IOX:[id number] DOB: 07/07/1956 DOA: 03/04/2014 PCP: [name], MD Brief interim history  Patient is a 58 year old male with history of follicular low-grade non-Hodgkin lymphoma, large left groin lymphadenopathy, diagnosed in 2000 and was on recent chemotherapy, had admission for acute renal failure 3/17- 3/20. He was suspected to have acute renal failure de to HCTZ and ARB.  He had outpatient left-sided inguinal node biopsy after which he has developed a large lymph filled seroma on the left groin requiring wound VAC placement and continuous drainage, he continues to drain about liter liter and a half on a daily basis, there were some superinfection which is responding well to antibiotics which are presently on board. Have discussed his case multiple times with general surgery who do not recommend any further surgical intervention. Once he has a rehabilitation bed he could be placed in a rehabilitation as he requires intermittent IV fluid boluses for ongoing fluid loss due to daily lymph drainage from the wound VAC. SIGNIFICANT EVENTS / STUDIES:  4/3 Hyperkalemia, hyperuricemia, hyperphosphatemia, with ARF requiring emergent CRRT. UA with urate crystals. Renal US normal. No pulmonary edema on CXR. Heparin gtt started due to concern for PE. Started IV vanc for left groin wound.  4/4 ARF thought to be due to ATN from severe sepsis, per oncology not tumor lysis syndrome. No DVT, heparin gtt stopped. 2D-echo normal.  4/5 Continue CRRT, to transition to HD, no renal recovery yet. Hyperuricemia resolved. Dialysis access is a right IJ dialysis catheter >> RIJ tunneled access palced today 4/20 4/6 .Wound vac placed  4/7 -hemodialysis initiated  Assessment/Plan:  Acute kidney injury-potentially related to tumor lysis, :  - Etiology prerenal from volume loss from her fistula versus severe sepsis from left groin infection. Per Oncology tumor lysis  syndrome less likely. -dialysis per renal, on a 3 times a week basis.  -per renal still hopeful for recovery of GFR -RIJ tunneled dialysis catheter placed today 4/20 -CIR consult, follow NHL (non-Hodgkin's lymphoma) with follicular low-grade, large left groin lymph adenopathy status post chemotherapy, hyperuricemia  Hyperuricemia has resolved, oncology following (Dr.Magrinat), stable G6PD, LDH levels are around 600 which is twice normal, transfuse if hemoglobin less than 7, currently stable. CT abdomen for restaging outpatient home along with outpatient followup with oncology post discharge.  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 Hulen Skains on 02/24/2014, patient to be discharged with home health and wound VAC, will follow with Dr. Barkley Bruns in the office post discharge.  -Noted wound growing gram-negative rods was switched to doxycycline + cipro on 02/25/2014 and monitor temperature curve, leukocytosis improving. Responding well to antibiotics continue for a total of 10 days stop date 03/07/2014.  -pt afebrile, wbc 10.3 Intermittent tachycardic and hypotension  Likely due to fluid removal during HD, along with ongoing fluid loss from his left inguinal drain which is draining between 1-1/2-2 L a day, will supplement with IV fluids as needed, have discussed with surgery, they have no other solution then to continue drainage this time. Per Dr. Barkley Bruns and Hulen Skains no further surgical intervention is required.  He was on steroids for hypotension while in the ICU which have been tapered off .  Type 2 diabetes mellitus  - Continue sliding scale insulin Abdominal discomfort  -Unclear etiology, resolved CBG (last 3)   Recent Labs   03/02/14 0412  03/02/14 0759  03/02/14 1218   GLUCAP  140*  138*  165*    GERD with history of GI bleed: Continue PPI  Deconditioning Pt will need SNF on admission -SW assisting with  placement DVT Prophylaxis: Heparin subcutaneous  Code Status: Full CODE STATUS  Family Communication:  Disposition: Rehabilitation  Consultants:  Surgery  Nephrology  Oncology     Antibiotics:  Doxycycline  Cipro  HPI/Subjective: Seen at dialysis, Denies any c/o today. S/P RIJ tunneled access placement this am  Objective: Filed Vitals:   03/03/2014 1830  BP: 74/47  Pulse: 103  Temp:   Resp: 15    Intake/Output Summary (Last 24 hours) at 03/11/2014 1858 Last data filed at 03/02/2014 1300  Gross per 24 hour  Intake    660 ml  Output    210 ml  Net    450 ml   Filed Weights   03/05/14 1653 03/05/14 2121 03/05/14 2156  Weight: 214 kg (471 lb 12.6 oz) 211.7 kg (466 lb 11.4 oz) 216 kg (476 lb 3.1 oz)    Exam:  General: alert & oriented x 3 In NAD,Morbidly obese  Cardiovascular: RRR, nl S1 s2 Respiratory: Decreased breath sounds at the bases, otherwise clear  Abdomen: soft +BS mild tenderness over mid abdominal area /ND, no masses palpable Extremities: +edema, left groin with wound status post debridement with wound VAC    Data Reviewed: Basic Metabolic Panel:  Recent Labs Lab 03/04/14 0550 03/05/14 0540 03/06/14 0446 03/07/14 0653 02/24/2014 0500  NA 131* 128* 133* 129* 128*  K 4.8 5.4* 4.5 4.8 5.6*  CL 85* 84* 89* 85* 82*  CO2 15* 16* 18* 15* 15*  GLUCOSE 104* 107* 136* 111* 88  BUN 58* 70* 43* 56* 69*  CREATININE 5.57* 6.64* 4.47* 5.49* 6.39*  CALCIUM 9.2 9.3 8.8 9.3 9.5  PHOS 6.8* 8.1* 5.9* 6.6* 8.5*   Liver Function Tests:  Recent Labs Lab 03/04/14 0550 03/05/14 0540 03/06/14 0446 03/07/14 0653 03/15/2014 0500  ALBUMIN 2.0* 2.0* 1.9* 2.0* 1.9*   No results found for this basename: LIPASE, AMYLASE,  in the last 168 hours No results found for this basename: AMMONIA,  in the last 168 hours CBC:  Recent Labs Lab 03/04/14 0550 03/05/14 0500 03/06/14 0445 03/07/14 0653 03/10/2014 0500  WBC 11.5* 11.6* 10.6* 11.1* 10.3  NEUTROABS 8.6* 8.7*  7.9* 8.0* 7.6  HGB 7.8* 7.6* 7.4* 7.7* 7.4*  HCT 23.7* 23.2* 22.4* 23.6* 22.8*  MCV 84.3 83.8 84.2 85.8 85.7  PLT 142* 131* 123* 131* 128*   Cardiac Enzymes: No results found for this basename: CKTOTAL, CKMB, CKMBINDEX, TROPONINI,  in the last 168 hours BNP (last 3 results)  Recent Labs  2014-03-15 1220  PROBNP 937.1*   CBG:  Recent Labs Lab 03/07/14 2013 03/18/2014 0004 02/17/2014 0412 03/01/2014 0748 03/13/2014 1114  GLUCAP 113* 113* 100* 93 90    No results found for this or any previous visit (from the past 240 hour(s)).   Studies: Dg Chest Port 1 View  03/01/2014   CLINICAL DATA:  Status post catheter placement  EXAM: PORTABLE CHEST - 1 VIEW  COMPARISON:  DG CHEST 1V PORT dated 02/24/2014; CT CHEST W/CM dated 12/02/2013; DG CHEST 1 VIEW dated 02/03/2014  FINDINGS: There is a dual-lumen right-sided central venous catheter in satisfactory position. There is a 2.8 cm right lower lobe pulmonary nodule. There is no other focal consolidation, pleural effusion or pneumothorax. Stable cardiac silhouette. Bilateral hilar enlargement  The osseous structures are unremarkable.  IMPRESSION: 1. Satisfactory position of a dialysis catheter. 2. Interval enlargement of  a right lower lobe pulmonary nodule.   Electronically Signed   By: Kathreen Devoid   On: 02/22/2014 13:11    Scheduled Meds: . albumin human      . bisacodyl  10 mg Rectal Daily  . ceFAZolin      . ceFAZolin      . ciprofloxacin  500 mg Oral Q breakfast  . docusate sodium  200 mg Oral BID  . doxycycline  100 mg Oral Q12H  . feeding supplement (NEPRO CARB STEADY)  237 mL Oral BID BM  . heparin subcutaneous  5,000 Units Subcutaneous 3 times per day  . insulin aspart  0-9 Units Subcutaneous 6 times per day  . multivitamin  1 tablet Oral QHS  . pantoprazole  40 mg Oral Q1200  . polyethylene glycol  17 g Oral Daily  . sodium bicarbonate  1,300 mg Oral TID  . sodium chloride  3 mL Intravenous Q12H   Continuous Infusions: . sodium  chloride 20 mL/hr at 02/23/2014 0930    Active Problems:   NHL (non-Hodgkin's lymphoma)   HTN (hypertension)   Dyslipidemia   Anemia of chronic disease   Morbid obesity   Lower GI bleed   Diabetes mellitus   Tumor lysis syndrome, possibility of   Uremia   SOB (shortness of breath) DDX = CHF vs. PE   Acidosis    Time spent: [facility] Pager [phone]. If 7PM-7AM, please contact night-coverage at [email or web address], password [facility] 03/12/2014, 6:58 PM  LOS: 17 days

## 2014-03-08 NOTE — Transfer of Care (Signed)
Immediate Anesthesia Transfer of Care Note  Patient: Douglas [name]  Procedure(s) Performed: Procedure(s): Removal of catheter,  INSERTION OF DIALYSIS CATHETER right IJ (Right)  Patient Location: PACU  Anesthesia Type:MAC  Level of Consciousness: awake, alert  and oriented  Airway & Oxygen Therapy: Patient Spontanous Breathing and Patient connected to face mask oxygen  Post-op Assessment: Report given to PACU RN, Post -op Vital signs reviewed and stable and Patient moving all extremities X 4  Post vital signs: Reviewed and stable  Complications: No apparent anesthesia complications

## 2014-03-08 NOTE — H&P (View-Only) (Signed)
Consult Note  Patient name: Douglas [name] MRN: [id number] DOB: March 09, 1956 Sex: male  Consulting Physician:  Renal  Reason for Consult:  Chief Complaint  Patient presents with  . Shortness of Breath    HISTORY OF PRESENT ILLNESS: 58 year old with acute renal failure from chemotherapy for non-Hodgkin Lymphoma.  He has a alrge lymph filled seroma in left groin from node biopsy.  Wound vac in place.  He is getting dialysis via a right IJ temp cath.  He is medically managed for HTN.  He is a diabetic on SSI in th e hospital  Past Medical History  Diagnosis Date  . Non Hodgkin's lymphoma January 2009  . Diabetes mellitus without complication   . Hypertension   . Sleep apnea   . Lymphadenopathy, inguinal     left    Past Surgical History  Procedure Laterality Date  . Biopsy for lymphoma Left 11/02/13    groin  . Colonoscopy N/A 12/22/2013    Procedure: COLONOSCOPY;  Surgeon: Beryle Beams, MD;  Location: [facility];  Service: Endoscopy;  Laterality: N/A;  . Esophagogastroduodenoscopy N/A 12/22/2013    Procedure: ESOPHAGOGASTRODUODENOSCOPY (EGD);  Surgeon: Beryle Beams, MD;  Location: [facility];  Service: Endoscopy;  Laterality: N/A;  . Ercp N/A 12/24/2013    Procedure: UPPER ENDOSCOPY;  Surgeon: Beryle Beams, MD;  Location: [facility];  Service: Gastroenterology;  Laterality: N/A;  . Colonoscopy N/A 12/24/2013    Procedure: COLONOSCOPY;  Surgeon: Beryle Beams, MD;  Location: [facility];  Service: Gastroenterology;  Laterality: N/A;    History   Social History  . Marital Status: Single    Spouse Name: N/A    Number of Children: N/A  . Years of Education: N/A   Occupational History  . Not on file.   Social History Main Topics  . Smoking status: Never Smoker   . Smokeless tobacco: Never Used  . Alcohol Use: No  . Drug Use: No  . Sexual Activity: Not on file   Other Topics Concern  . 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 CAD Mother 17    Died at age 50  . CAD Father     Allergies as of 03/15/2014  . (No Known Allergies)    No current facility-administered medications on file prior to encounter.   Current Outpatient Prescriptions on File Prior to Encounter  Medication Sig Dispense Refill  . docusate sodium (COLACE) 100 MG capsule Take 100 mg by mouth 2 (two) times daily.      . Ferrous Sulfate (IRON) 325 (65 FE) MG TABS Take 325 mg by mouth 2 (two) times daily.  60 each  0  . omeprazole (PRILOSEC) 40 MG capsule Take 1 capsule (40 mg total) by mouth 2 (two) times daily.  60 capsule  0  . polyethylene glycol (MIRALAX / GLYCOLAX) packet Take 17 g by mouth daily as needed for mild constipation.       . prochlorperazine (COMPAZINE) 10 MG tablet Take 1 tablet (10 mg total) by mouth every 6 (six) hours as needed for nausea or vomiting.  60 tablet  0     REVIEW OF SYSTEMS: Constitutional: +fatigue + weight change .  Respiratory: + dyspnea + hiccups  Cardiovascular: + chest pain + palpitations +  left unilateral swelling  Gastrointestinal: + lower abdomina pain + dysphagia +constipation  Genitourinary: + decreased urine output  Skin: + left inguinal node drainage  All others negative  PHYSICAL EXAMINATION: General: The patient appears their stated age.  Vital signs are BP 116/71  Pulse 108  Temp(Src) 97.5 F (36.4 C) (Oral)  Resp 19  Ht 5' 10.08" (1.78 m)  Wt 476 lb 3.1 oz (216 kg)  BMI 68.17 kg/m2  SpO2 99% Pulmonary: Respirations are non-labored HEENT:  No gross abnormalities Abdomen: Soft and non-tender  Musculoskeletal: There are no major deformities.   Neurologic: No focal weakness or paresthesias are detected, Skin: Left groin wound Psychiatric: The patient has normal affect. Cardiovascular: Tachy, L LE edema   Assessment:  Renal failure Plan: Permanent catheter  tomorrow in the OR.  Discussed with patient and family at bedside     V. Leia Alf, M.D. [facility] Office: [phone] Pager:  [phone]

## 2014-03-08 NOTE — Progress Notes (Signed)
Pt will place on CPAP when ready for bed. Pt encouraged to call RT if needing any assistance

## 2014-03-08 NOTE — Interval H&P Note (Signed)
History and Physical Interval Note:  03-17-2014 9:34 AM  Douglas [name]  has presented today for surgery, with the diagnosis of hyperkalemia  The various methods of treatment have been discussed with the patient and family. After consideration of risks, benefits and other options for treatment, the patient has consented to  Procedure(s): INSERTION OF DIALYSIS CATHETER (N/A) as a surgical intervention .  The patient's history has been reviewed, patient examined, no change in status, stable for surgery.  I have reviewed the patient's chart and labs.  Questions were answered to the patient's satisfaction.     [place]

## 2014-03-08 NOTE — Anesthesia Preprocedure Evaluation (Addendum)
Anesthesia Evaluation  Patient identified by MRN, date of birth, ID band Patient awake    Reviewed: Allergy & Precautions, H&P , NPO status , Patient's Chart, lab work & pertinent test results  History of Anesthesia Complications Negative for: history of anesthetic complications  Airway Mallampati: I TM Distance: >3 FB Neck ROM: Full    Dental  (+) Teeth Intact   Pulmonary shortness of breath, sleep apnea and Continuous Positive Airway Pressure Ventilation ,    Pulmonary exam normal       Cardiovascular hypertension,     Neuro/Psych negative neurological ROS  negative psych ROS   GI/Hepatic negative GI ROS, Neg liver ROS,   Endo/Other  diabetesMorbid obesity  Renal/GU CRF and DialysisRenal disease     Musculoskeletal   Abdominal   Peds  Hematology  (+) Blood dyscrasia, anemia , Non Hodgkin's Lymphoma   Anesthesia Other Findings   Reproductive/Obstetrics                        Anesthesia Physical Anesthesia Plan  ASA: III  Anesthesia Plan: MAC   Post-op Pain Management:    Induction:   Airway Management Planned:   Additional Equipment:   Intra-op Plan:   Post-operative Plan:   Informed Consent: I have reviewed the patients History and Physical, chart, labs and discussed the procedure including the risks, benefits and alternatives for the proposed anesthesia with the patient or authorized representative who has indicated his/her understanding and acceptance.   Dental advisory given  Plan Discussed with: CRNA, Anesthesiologist and Surgeon  Anesthesia Plan Comments:        Anesthesia Quick Evaluation

## 2014-03-08 NOTE — Progress Notes (Signed)
INITIAL NUTRITION ASSESSMENT  DOCUMENTATION CODES Per approved criteria  -Morbid Obesity   INTERVENTION: Add Nepro Shake po BID, each supplement provides 425 kcal and 19 grams protein. Encouraged these renal friendly supplements, rather than the Boost Plus that pt has been drinking from home, notified RN. RD to continue to follow nutrition care plan.  NUTRITION DIAGNOSIS: Increased nutrient needs related to wound healing and HD as evidenced by estimated needs.   Goal: Intake to meet >90% of estimated nutrition needs.  Monitor:  weight trends, lab trends, I/O's, PO intake, supplement tolerance  Reason for Assessment: Health History  58 y.o. male  Admitting Dx: Acute kidney injury  ASSESSMENT: PMHx significant for DM, HTN, dyslipidemia, morbid obesity, low-grade follicular NHL - supposed to have palliative XRT, receiving chemotherapy. Admitted with LLE swelling and increased SOB. Work-up reveals AKI.  Received CRRT 4/3 - 4/5. IHD initiated 4/7. Underwent HD catheter insertion today.  Pt with nonhealing L inguinal wound s/p bx in Dec 2014 and I&D in Mar 2015. VAC placed 4/6, dressing changes 2 x week.  Ordered for Renal Diet. Pt reports that his intake is overall "fair", sometimes it is good and sometimes it is bad. He states that when his meal intake is bad, he drinks Creamy Strawberry Boost that his family has been providing, this provides 300 mg Phosphorus and 360 mg Potassium per 8 oz. We discussed a more appropriate oral nutrition supplement, Nepro, and pt was acceptable to trying these. He notes that he will no longer drink Boost until otherwise instructed. Discussed increased protein needs in relation to wound healing and HD.   Unable to complete accurate physical assessment 2/2 obese body habitus.  Sodium low at 128 Potassium elevated at 5.6 Phosphorus elevated at 8.5  Height: Ht Readings from Last 1 Encounters:  02/26/14 5' 10.08" (1.78 m)    Weight: Wt Readings  from Last 1 Encounters:  03/05/14 476 lb 3.1 oz (216 kg)    Ideal Body Weight: 166 lb  % Ideal Body Weight: 287%  Wt Readings from Last 25 Encounters:  03/05/14 476 lb 3.1 oz (216 kg)  03/05/14 476 lb 3.1 oz (216 kg)  02/05/14 404 lb 6.4 oz (183.435 kg)  01/28/14 426 lb (193.232 kg)  01/22/14 425 lb (192.779 kg)  01/19/14 430 lb (195.047 kg)  01/12/14 444 lb 4.8 oz (201.533 kg)  12/26/13 474 lb 3.4 oz (215.1 kg)  12/26/13 474 lb 3.4 oz (215.1 kg)  12/26/13 474 lb 3.4 oz (215.1 kg)  12/26/13 474 lb 3.4 oz (215.1 kg)  12/04/13 481 lb 8 oz (218.407 kg)  12/01/13 481 lb 8 oz (218.407 kg)  11/10/13 463 lb 8 oz (210.242 kg)  10/31/13 472 lb 14.2 oz (214.5 kg)    Usual Body Weight: variable; 475 lb  % Usual Body Weight: 100%  BMI:  Body mass index is 68.17 kg/(m^2). Obese Class III  Estimated Nutritional Needs: Kcal: 2300 - 2600 kcal Protein: 130 - 150 g Fluid: 1.2 liters  Skin:  Stage II to mid sacrum Closed R neck incision Thigh incision Groin incision - wound VAC in place  Diet Order: Renal w/ 1200 ml Fluid Restriction  EDUCATION NEEDS: -No education needs identified at this time   Intake/Output Summary (Last 24 hours) at 03/08/2014 1410 Last data filed at 02/21/2014 1300  Gross per 24 hour  Intake    780 ml  Output    210 ml  Net    570 ml    Last BM: 4/19  Labs:   Recent Labs Lab 03/06/14 0446 03/07/14 0653 02/26/2014 0500  NA 133* 129* 128*  K 4.5 4.8 5.6*  CL 89* 85* 82*  CO2 18* 15* 15*  BUN 43* 56* 69*  CREATININE 4.47* 5.49* 6.39*  CALCIUM 8.8 9.3 9.5  PHOS 5.9* 6.6* 8.5*  GLUCOSE 136* 111* 88    CBG (last 3)   Recent Labs  03/10/2014 0412 03/12/2014 0748 03/03/2014 1114  GLUCAP 100* 93 90    Scheduled Meds: . bisacodyl  10 mg Rectal Daily  . ceFAZolin      . ceFAZolin      . ciprofloxacin  500 mg Oral Q breakfast  . docusate sodium  200 mg Oral BID  . doxycycline  100 mg Oral Q12H  . heparin subcutaneous  5,000 Units Subcutaneous  3 times per day  . insulin aspart  0-9 Units Subcutaneous 6 times per day  . multivitamin  1 tablet Oral QHS  . pantoprazole  40 mg Oral Q1200  . polyethylene glycol  17 g Oral Daily  . sodium bicarbonate  1,300 mg Oral TID  . sodium chloride  3 mL Intravenous Q12H    Continuous Infusions: . sodium chloride 20 mL/hr at 02/22/2014 0930    Past Medical History  Diagnosis Date  . Non Hodgkin's lymphoma January 2009  . Diabetes mellitus without complication   . Hypertension   . Sleep apnea   . Lymphadenopathy, inguinal     left    Past Surgical History  Procedure Laterality Date  . Biopsy for lymphoma Left 11/02/13    groin  . Colonoscopy N/A 12/22/2013    Procedure: COLONOSCOPY;  Surgeon: Beryle Beams, MD;  Location: [facility];  Service: Endoscopy;  Laterality: N/A;  . Esophagogastroduodenoscopy N/A 12/22/2013    Procedure: ESOPHAGOGASTRODUODENOSCOPY (EGD);  Surgeon: Beryle Beams, MD;  Location: [facility];  Service: Endoscopy;  Laterality: N/A;  . Ercp N/A 12/24/2013    Procedure: UPPER ENDOSCOPY;  Surgeon: Beryle Beams, MD;  Location: [facility];  Service: Gastroenterology;  Laterality: N/A;  . Colonoscopy N/A 12/24/2013    Procedure: COLONOSCOPY;  Surgeon: Beryle Beams, MD;  Location: [facility];  Service: Gastroenterology;  Laterality: N/A;    Inda Coke MS, RD, LDN Inpatient Registered Dietitian Pager: [phone] After-hours pager: [phone]

## 2014-03-08 NOTE — Op Note (Signed)
OPERATIVE NOTE  PROCEDURE: 1. Right internal jugular vein tunneled dialysis catheter exchange 2. Right temporary dialysis catheter removal  PRE-OPERATIVE DIAGNOSIS: end-stage renal failure  POST-OPERATIVE DIAGNOSIS: same as above  SURGEON: Adele Barthel, MD  ANESTHESIA: local  ESTIMATED BLOOD LOSS: 30 cc  FINDING(S): 1.  Tips of the catheter in the right atrium on fluoroscopy 2.  No obvious pneumothorax on fluoroscopy  SPECIMEN(S):  none  INDICATIONS:   Douglas [name] is a 58 y.o. male who presents with end stage renal disease.  The patient presents for tunneled dialysis catheter exchange of prior right neck temporary dialysis catheter.  The patient is aware the risks of tunneled dialysis catheter placement include but are not limited to: bleeding, infection, central venous injury, pneumothorax, possible venous stenosis, possible malpositioning in the venous system, and possible infections related to long-term catheter presence.  The patient was aware of these risks and agreed to proceed.  DESCRIPTION: After written full informed consent was obtained from the patient, the patient was taken back to the operating room.  Prior to induction, the patient was given IV antibiotics.  After obtaining adequate sedation, the patient was prepped and draped in the standard fashion for a chest or neck tunneled dialysis catheter exchange.  I anesthesized the neck cannulation site with local anesthetic.  I transected one of the dialysis port and advanced the J-wire was then placed down in the inferior vena cava under fluroscopic guidance.  The prior catheter removed over the wire.  The wire was then secured in place with a clamp to the drapes.  The cannulation site, the catheter exit site, and tract for the subcutaneous tunnel were then anesthesized with a total of 40 cc of a 1:1 mixture of 0.5% Marcaine without epinepherine and 1% Lidocaine with epinepherine.  I then made stab incisions at the neck  and exit sites.   I dissected from the exit site to the cannulation site with a metal tunneler.   The subcutaneous tunnel was dilated by passing a plastic dilator over the metal dissector. The wire was then unclamped and I removed the needle.  The skin tract and venotomy was dilated serially with dilators.  Finally, the dilator-sheath was placed under fluroscopic guidance into the superior vena cava.  The dilator and wire were removed.  A 23 cm Diatek catheter was placed under fluoroscopic guidance down into the right atrium.  The sheath was broken and peeled away while holding the catheter cuff at the level of the skin.  The back end of this catheter was transected, revealing the two lumens of this catheter.  The ports were docked onto these two lumens.  The catheter hub was then screwed into place.  Each port was tested by aspirating and flushing.  No resistance was noted.  Each port was then thoroughly flushed with heparinized saline.  The catheter was secured in placed with two interrupted stitches of 3-0 Nylon tied to the catheter.  The neck incision was closed with a U-stitch of 4-0 Monocryl.  The neck and chest incision were cleaned and sterile bandages applied.  Each port was then loaded with concentrated heparin (1000 Units/mL) at the manufacturer recommended volumes to each port.  Sterile caps were applied to each port.  On completion fluoroscopy, the tips of the catheter were in the right atrium, and there was no evidence of pneumothorax.  COMPLICATIONS: none  CONDITION: stable  Adele Barthel, MD [facility] Office: [phone] Pager: [phone]  03/09/2014, 10:58  AM     

## 2014-03-08 NOTE — Anesthesia Procedure Notes (Signed)
Procedure Name: MAC Date/Time: 14-Mar-2014 10:22 AM Performed by: Jacob Moores Pre-anesthesia Checklist: Patient identified, Emergency Drugs available, Suction available and Patient being monitored Patient Re-evaluated:Patient Re-evaluated prior to inductionOxygen Delivery Method: Simple face mask Preoxygenation: Pre-oxygenation with 100% oxygen Placement Confirmation: positive ETCO2 and breath sounds checked- equal and bilateral Dental Injury: Teeth and Oropharynx as per pre-operative assessment

## 2014-03-08 NOTE — Procedures (Signed)
Patient was seen on dialysis and the procedure was supervised. BFR 300 Via RIJ PC BP is 91/49 after IV albumin but was low at beginning of treatment.  Patient appears to be tolerating treatment well

## 2014-03-08 NOTE — Consult Note (Signed)
Physical Medicine and Rehabilitation Consult  Reason for Consult: Deconditioning due to Acute renal failure, NHL, draining left groin wound with LLE edema.  Referring Physician: Dr. Inis Sizer.  HPI: Douglas [name] is a 58 y.o. male with history of morbid obesity, OSA, DM type 2,  follicular low-grade non-Hodgkin's lymphoma with large left groin lymphadenopathy diagnosed in 2009 and progressive disease per Bx 10/2013. He was started on chemo and has had issues with groin abscess requiring  I and D on 01/19/14 as well as acute renal injury requiring admission 03/17-3/20/15. Wound culture positive for MRSA and E coli and wet to dry dressing ongoing by [facility]. He was readmitted on 03/15/2014 with acute renal failure with hyperkalemia as well as SOB with difficulty ambulating.  He underwent emergent CRRT for oliguric renal failure with metabolic acidosis. He was started on IV heparin due to concerns of PE and BLE dopplers negative for DVT. Antibiotics broadened  Vancomycin and Dr. [name] consulted for input on draining wound. 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 Wound cultures positive for rare gram negative rods and he has completed 10 day course of antibiotic therapy.  Patient without improvement in UOP and no evidence of renal recovery. HD ongoing and IJ cath placed by Dr. Bridgett Larsson today.  To be d/c with VAC (as continues to have 1/2-2 Liters output daily) and follow up with general surgery past discharge. He is severely deconditioned and CIR recommended by PT/MD.    Review of Systems  HENT: Negative for hearing loss.   Eyes: Negative for blurred vision and double vision.  Respiratory: Positive for shortness of breath. Negative for wheezing.   Cardiovascular: Positive for leg swelling. Negative for chest pain and palpitations.  Gastrointestinal: Negative for nausea, abdominal pain and constipation.  Musculoskeletal: Positive for myalgias (Left  groin and LLE pain).  Neurological: Positive for focal weakness (LLE due to edema). Negative for dizziness and headaches.     Past Medical History  Diagnosis Date  . Non Hodgkin's lymphoma January 2009  . Diabetes mellitus without complication   . Hypertension   . Sleep apnea   . Lymphadenopathy, inguinal     left   Past Surgical History  Procedure Laterality Date  . Biopsy for lymphoma Left 11/02/13    groin  . Colonoscopy N/A 12/22/2013    Procedure: COLONOSCOPY;  Surgeon: Beryle Beams, MD;  Location: [facility] ENDOSCOPY;  Service: Endoscopy;  Laterality: N/A;  . Esophagogastroduodenoscopy N/A 12/22/2013    Procedure: ESOPHAGOGASTRODUODENOSCOPY (EGD);  Surgeon: Beryle Beams, MD;  Location: [facility];  Service: Endoscopy;  Laterality: N/A;  . Ercp N/A 12/24/2013    Procedure: UPPER ENDOSCOPY;  Surgeon: Beryle Beams, MD;  Location: [facility] ORS;  Service: Gastroenterology;  Laterality: N/A;  . Colonoscopy N/A 12/24/2013    Procedure: COLONOSCOPY;  Surgeon: Beryle Beams, MD;  Location: [facility] ORS;  Service: Gastroenterology;  Laterality: N/A;   Family History  Problem Relation Age of Onset  . CAD Mother 86    Died at age 59  . CAD Father    Social History:  Lives with a girlfriend. 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 He reports that he has never smoked. He has never used smokeless tobacco. He reports that he does not drink alcohol or use illicit drugs.  Allergies: No Known Allergies   Medications Prior to Admission  Medication Sig Dispense Refill  . Alogliptin-Pioglitazone (OSENI) 25-15 MG TABS Take 1 tablet  by mouth daily.      [name] docusate sodium (COLACE) 100 MG capsule Take 100 mg by mouth 2 (two) times daily.      . Ferrous Sulfate (IRON) 325 (65 FE) MG TABS Take 325 mg by mouth 2 (two) times daily.  60 each  0  . Hydrocortisone-Aloe Vera (CORTIZONE-10 INTENSIVE HEALING) 1 % CREA Apply 1 application topically as needed (For itching).      [name] omeprazole (PRILOSEC) 40 MG  capsule Take 1 capsule (40 mg total) by mouth 2 (two) times daily.  60 capsule  0  . polyethylene glycol (MIRALAX / GLYCOLAX) packet Take 17 g by mouth daily as needed for mild constipation.       . prochlorperazine (COMPAZINE) 10 MG tablet Take 1 tablet (10 mg total) by mouth every 6 (six) hours as needed for nausea or vomiting.  60 tablet  0  . [DISCONTINUED] oxyCODONE-acetaminophen (PERCOCET) 10-325 MG per tablet Take 1 tablet by mouth every 6 (six) hours as needed for pain.  60 tablet  0    Home: Home Living Family/patient expects to be discharged to:: Private residence Living Arrangements: Non-relatives/Friends Available Help at Discharge: Other (Comment) (girlfriend goes to work at 3 p.m.) Type of Home: House Home Access: Stairs to enter Technical brewer of Steps: 1 Home Layout: One level Home Equipment: [place] - single point  Functional History: Prior Function Level of Independence: Independent with assistive device(s) Functional Status:  Mobility: Bed Mobility Overal bed mobility: Needs Assistance Bed Mobility: Rolling Rolling: Min assist Supine to sit: Mod assist Sit to supine: Mod assist;+2 for safety/equipment General bed mobility comments: use of rail for rolling and side to sit with mod assist for left LE off bed and to scoot hips out; to supine assist for left leg in bed and cues/guiding assist of another for upperbody management Transfers Overall transfer level: Needs assistance Transfers: Lateral/Scoot Transfers Sit to Stand: Mod assist;+2 physical assistance;+2 safety/equipment Squat pivot transfers: +2 safety/equipment;+2 physical assistance;Min assist  Lateral/Scoot Transfers: Mod assist;+2 physical assistance General transfer comment: sit<>stand x 2; then assist to scoot up towards head of bed with lateral pivots x 2; pulled up on bars on either side of bari chair      ADL: ADL Overall ADL's : Needs assistance/impaired Grooming: Wash/dry hands;Wash/dry  face;Set up;Bed level Grooming Details (indicate cue type and reason): attempted x2 to get to sitting on edge of bed. He has difficlty flexing left hip enough to get his balance in sitting due to the edema and bed did not go low enough to get his feet the floor.  Upper Body Bathing: Set up;Bed level;Minimal assitance Upper Body Bathing Details (indicate cue type and reason): HOB elevated Lower Body Bathing: Maximal assistance;Total assistance;Bed level Lower Body Bathing Details (indicate cue type and reason): HOB elevated, rolling Upper Body Dressing : Set up;Minimal assistance;Bed level Upper Body Dressing Details (indicate cue type and reason): HOB elevated Lower Body Dressing: Total assistance Toilet Transfer Details (indicate cue type and reason): attempted x2 to get to sitting on edge of bed. He has difficlty flexing left hip enough to get his balance in sitting due to the edema and bed did not go low enough to get his feet the floor.  Toileting- Clothing Manipulation and Hygiene: Total assistance General ADL Comments: EOB not attempted with only 1 perosn assist this session  Cognition: Cognition Overall Cognitive Status: Within Functional Limits for tasks assessed Orientation Level: Oriented X4 Cognition Arousal/Alertness: Awake/alert Behavior During Therapy: [facility] for  tasks assessed/performed Overall Cognitive Status: Within Functional Limits for tasks assessed  Blood pressure 75/30, pulse 85, temperature 98 F (36.7 C), temperature source Oral, resp. rate 15, height 5' 10.08" (1.78 m), weight 216 kg (476 lb 3.1 oz), SpO2 98.00%. Physical Exam  Nursing note and vitals reviewed. Constitutional: He is oriented to person, place, and time. He appears well-developed and well-nourished.  Morbidly obese  HENT:  Head: Normocephalic and atraumatic.  Eyes: Conjunctivae are normal. Pupils are equal, round, and reactive to light.  Neck: Normal range of motion. Neck supple. No JVD present. No  tracheal deviation present. No thyromegaly present.  Cardiovascular: Normal rate and regular rhythm.   Respiratory: Effort normal and breath sounds normal. No respiratory distress. He has no wheezes.  GI: Soft. Bowel sounds are normal. He exhibits no distension. There is no tenderness.  Musculoskeletal: He exhibits edema.  2+ edema left thigh and calf. Unable to activate  LLE.   Lymphadenopathy:    He has no cervical adenopathy.  Neurological: He is alert and oriented to person, place, and time.  Alert and oriented. Cannot lift LLE due to vac/edema. UE 4+/5. RLE 3/5 hf to 4/5 at ankle. LLE 1/5hf, 1+ ke and 3- ankle. Decreased LT in both feet.  Skin: Skin is warm and dry.  Stasis changes bilateral shins. Vac/wound left groin    Results for orders placed during the hospital encounter of 02/21/2014 (from the past 24 hour(s))  GLUCOSE, CAPILLARY     Status: Abnormal   Collection Time    03/07/14  4:20 PM      Result Value Ref Range   Glucose-Capillary 121 (*) 70 - 99 mg/dL  GLUCOSE, CAPILLARY     Status: Abnormal   Collection Time    03/07/14  8:13 PM      Result Value Ref Range   Glucose-Capillary 113 (*) 70 - 99 mg/dL  GLUCOSE, CAPILLARY     Status: Abnormal   Collection Time    03/04/2014 12:04 AM      Result Value Ref Range   Glucose-Capillary 113 (*) 70 - 99 mg/dL  GLUCOSE, CAPILLARY     Status: Abnormal   Collection Time    02/27/2014  4:12 AM      Result Value Ref Range   Glucose-Capillary 100 (*) 70 - 99 mg/dL  CBC WITH DIFFERENTIAL     Status: Abnormal   Collection Time    03/10/2014  5:00 AM      Result Value Ref Range   WBC 10.3  4.0 - 10.5 K/uL   RBC 2.66 (*) 4.22 - 5.81 MIL/uL   Hemoglobin 7.4 (*) 13.0 - 17.0 g/dL   HCT 22.8 (*) 39.0 - 52.0 %   MCV 85.7  78.0 - 100.0 fL   MCH 27.8  26.0 - 34.0 pg   MCHC 32.5  30.0 - 36.0 g/dL   RDW 19.0 (*) 11.5 - 15.5 %   Platelets 128 (*) 150 - 400 K/uL   Neutrophils Relative % 73  43 - 77 %   Lymphocytes Relative 12  12 - 46 %    Monocytes Relative 14 (*) 3 - 12 %   Eosinophils Relative 1  0 - 5 %   Basophils Relative 0  0 - 1 %   Neutro Abs 7.6  1.7 - 7.7 K/uL   Lymphs Abs 1.2  0.7 - 4.0 K/uL   Monocytes Absolute 1.4 (*) 0.1 - 1.0 K/uL   Eosinophils Absolute 0.1  0.0 -  0.7 K/uL   Basophils Absolute 0.0  0.0 - 0.1 K/uL   RBC Morphology POLYCHROMASIA PRESENT     WBC Morphology DOHLE BODIES    RENAL FUNCTION PANEL     Status: Abnormal   Collection Time    03/03/2014  5:00 AM      Result Value Ref Range   Sodium 128 (*) 137 - 147 mEq/L   Potassium 5.6 (*) 3.7 - 5.3 mEq/L   Chloride 82 (*) 96 - 112 mEq/L   CO2 15 (*) 19 - 32 mEq/L   Glucose, Bld 88  70 - 99 mg/dL   BUN 69 (*) 6 - 23 mg/dL   Creatinine, Ser 6.39 (*) 0.50 - 1.35 mg/dL   Calcium 9.5  8.4 - 10.5 mg/dL   Phosphorus 8.5 (*) 2.3 - 4.6 mg/dL   Albumin 1.9 (*) 3.5 - 5.2 g/dL   GFR calc non Af Amer 9 (*) >90 mL/min   GFR calc Af Amer 10 (*) >90 mL/min  GLUCOSE, CAPILLARY     Status: None   Collection Time    03/18/2014  7:48 AM      Result Value Ref Range   Glucose-Capillary 93  70 - 99 mg/dL  GLUCOSE, CAPILLARY     Status: None   Collection Time    02/17/2014 11:14 AM      Result Value Ref Range   Glucose-Capillary 90  70 - 99 mg/dL   Comment 1 Notify RN     Dg Chest Port 1 View  02/26/2014   CLINICAL DATA:  Status post catheter placement  EXAM: PORTABLE CHEST - 1 VIEW  COMPARISON:  DG CHEST 1V PORT dated 02/24/2014; CT CHEST W/CM dated 12/02/2013; DG CHEST 1 VIEW dated 02/03/2014  FINDINGS: There is a dual-lumen right-sided central venous catheter in satisfactory position. There is a 2.8 cm right lower lobe pulmonary nodule. There is no other focal consolidation, pleural effusion or pneumothorax. Stable cardiac silhouette. Bilateral hilar enlargement  The osseous structures are unremarkable.  IMPRESSION: 1. Satisfactory position of a dialysis catheter. 2. Interval enlargement of a right lower lobe pulmonary nodule.   Electronically Signed   By: Kathreen Devoid    On: 03/18/2014 13:11    Assessment/Plan: Diagnosis: deconditioning related to renal failure, left groin wound and multiple medical conditions 1. Does the need for close, 24 hr/day medical supervision in concert with the patient's rehab needs make it unreasonable for this patient to be served in a less intensive setting? Yes 2. Co-Morbidities requiring supervision/potential complications: ACD, morbid obesity, NHL 3. Due to bladder management, bowel management, safety, skin/wound care, disease management, medication administration, pain management and patient education, does the patient require 24 hr/day rehab nursing? Yes 4. Does the patient require coordinated care of a physician, rehab nurse, PT (1-2 hrs/day, 5 days/week) and OT (1-2 hrs/day, 5 days/week) to address physical and functional deficits in the context of the above medical diagnosis(es)? Yes Addressing deficits in the following areas: balance, endurance, locomotion, strength, transferring, bowel/bladder control, bathing, dressing, feeding, grooming, toileting and pain mgt 5. Can the patient actively participate in an intensive therapy program of at least 3 hrs of therapy per day at least 5 days per week? Yes 6. The potential for patient to make measurable gains while on inpatient rehab is excellent 7. Anticipated functional outcomes upon discharge from inpatient rehab are modified independent  with PT, modified independent and supervision with OT, n/a with SLP. 8. Estimated rehab length of stay to reach the above  functional goals is: 11-15 days 9. Does the patient have adequate social supports to accommodate these discharge functional goals? Yes 10. Anticipated D/C setting: Home 11. Anticipated post D/C treatments: [place] therapy 12. Overall Rehab/Functional Prognosis: excellent  RECOMMENDATIONS: This patient's condition is appropriate for continued rehabilitative care in the following setting: CIR Patient has agreed to participate in  recommended program. Yes Note that insurance prior authorization may be required for reimbursement for recommended care.  Comment: Rehab Admissions Coordinator to follow up.  Thanks,  Meredith Staggers, MD, [facility]     03/02/2014

## 2014-03-09 DIAGNOSIS — L0291 Cutaneous abscess, unspecified: Secondary | ICD-10-CM

## 2014-03-09 DIAGNOSIS — R5381 Other malaise: Secondary | ICD-10-CM

## 2014-03-09 DIAGNOSIS — L0390 Cellulitis, unspecified: Secondary | ICD-10-CM

## 2014-03-09 DIAGNOSIS — N179 Acute kidney failure, unspecified: Secondary | ICD-10-CM

## 2014-03-09 LAB — RENAL FUNCTION PANEL
Albumin: 2 g/dL — ABNORMAL LOW (ref 3.5–5.2)
BUN: 51 mg/dL — ABNORMAL HIGH (ref 6–23)
CO2: 16 mEq/L — ABNORMAL LOW (ref 19–32)
Calcium: 9.3 mg/dL (ref 8.4–10.5)
Chloride: 85 mEq/L — ABNORMAL LOW (ref 96–112)
Creatinine, Ser: 4.9 mg/dL — ABNORMAL HIGH (ref 0.50–1.35)
GFR calc Af Amer: 14 mL/min — ABNORMAL LOW (ref >90)
GFR calc non Af Amer: 12 mL/min — ABNORMAL LOW (ref >90)
GLUCOSE: 152 mg/dL — AB (ref 70–99)
POTASSIUM: 5.3 meq/L (ref 3.7–5.3)
Phosphorus: 6.9 mg/dL — ABNORMAL HIGH (ref 2.3–4.6)
Sodium: 130 mEq/L — ABNORMAL LOW (ref 137–147)

## 2014-03-09 LAB — CBC WITH DIFFERENTIAL/PLATELET
Basophils Absolute: 0 10*3/uL (ref 0.0–0.1)
Basophils Relative: 0 % (ref 0–1)
Eosinophils Absolute: 0.1 10*3/uL (ref 0.0–0.7)
Eosinophils Relative: 1 % (ref 0–5)
HEMATOCRIT: 23.4 % — AB (ref 39.0–52.0)
Hemoglobin: 7.5 g/dL — ABNORMAL LOW (ref 13.0–17.0)
LYMPHS ABS: 1.4 10*3/uL (ref 0.7–4.0)
Lymphocytes Relative: 14 % (ref 12–46)
MCH: 27.5 pg (ref 26.0–34.0)
MCHC: 32.1 g/dL (ref 30.0–36.0)
MCV: 85.7 fL (ref 78.0–100.0)
MONOS PCT: 17 % — AB (ref 3–12)
Monocytes Absolute: 1.7 10*3/uL — ABNORMAL HIGH (ref 0.1–1.0)
NEUTROS ABS: 6.7 10*3/uL (ref 1.7–7.7)
Neutrophils Relative %: 68 % (ref 43–77)
Platelets: 121 10*3/uL — ABNORMAL LOW (ref 150–400)
RBC: 2.73 MIL/uL — ABNORMAL LOW (ref 4.22–5.81)
RDW: 19.6 % — AB (ref 11.5–15.5)
WBC: 9.9 10*3/uL (ref 4.0–10.5)

## 2014-03-09 LAB — GLUCOSE, CAPILLARY
Glucose-Capillary: 145 mg/dL — ABNORMAL HIGH (ref 70–99)
Glucose-Capillary: 155 mg/dL — ABNORMAL HIGH (ref 70–99)
Glucose-Capillary: 162 mg/dL — ABNORMAL HIGH (ref 70–99)
Glucose-Capillary: 137 mg/dL — ABNORMAL HIGH (ref 70–99)

## 2014-03-09 NOTE — Consult Note (Addendum)
WOC wound follow up Wound type: Vac dressing changed to left groin full thickness wound. Measurement: 3X4X8cm, tunnels towards feet at 7:00 o'clock when swab inserted.  Wound bed: 100% beefy red Drainage (amount, consistency, odor) Mod amt thick yellow drainage in cannister, further expressed when wound palpated; strong odor Periwound: Intact skin surrounding Dressing procedure/placement/frequency: Applied one piece of white foam to tunneling area and one piece black foam over top of wound.  Bridged track pad to thigh area away from fold in groin.  Cont suction on at 19mm.  Pt tolerated without discomfort. Plan dressing change Friday. Julien Girt MSN, RN, [place], [place], [place]

## 2014-03-09 NOTE — Progress Notes (Signed)
[facility] PROGRESS NOTE  Douglas [name] YHC:[id number] DOB: [birth year] DOA: 03/12/2014 PCP: [name], MD Brief interim history  Patient is a 58 year old male with history of follicular low-grade non-Hodgkin lymphoma, large left groin lymphadenopathy, diagnosed in 2000 and was on recent chemotherapy, had admission for acute renal failure 3/17- 3/20. He was suspected to have acute renal failure de to HCTZ and ARB.  He had outpatient left-sided inguinal node biopsy after which he has developed a large lymph filled seroma on the left groin requiring wound VAC placement and continuous drainage, he continues to drain about liter liter and a half on a daily basis, there were some superinfection which is responding well to antibiotics which are presently on board. Have discussed his case multiple times with general surgery who do not recommend any further surgical intervention. Once he has a rehabilitation bed he could be placed in a rehabilitation as he requires intermittent IV fluid boluses for ongoing fluid loss due to daily lymph drainage from the wound VAC. SIGNIFICANT EVENTS / STUDIES:  4/3 Hyperkalemia, hyperuricemia, hyperphosphatemia, with ARF requiring emergent CRRT. UA with urate crystals. Renal US normal. No pulmonary edema on CXR. Heparin gtt started due to concern for PE. Started IV vanc for left groin wound.  4/4 ARF thought to be due to ATN from severe sepsis, per oncology not tumor lysis syndrome. No DVT, heparin gtt stopped. 2D-echo normal.  4/5 Continue CRRT, to transition to HD, no renal recovery yet. Hyperuricemia resolved. Dialysis access is a right IJ dialysis catheter >> RIJ tunneled access palced today 4/20 4/6 .Wound vac placed  4/7 -hemodialysis initiated  Assessment/Plan:  Acute kidney injury-potentially related to tumor lysis, :  - Etiology prerenal from volume loss from her fistula versus severe sepsis from left groin infection. Per Oncology tumor lysis  syndrome less likely. -dialysis per renal, on a 3 times a week basis.  -per renal still hopeful for recovery of GFR -RIJ tunneled dialysis catheter placed 4/20 -Seen by CIR>> now awaiting insurance approval -Appreciate renal assistance NHL (non-Hodgkin's lymphoma) with follicular low-grade, large left groin lymph adenopathy status post chemotherapy, hyperuricemia  Hyperuricemia has resolved, oncology following (Dr.Magrinat), stable G6PD, LDH levels are around 600 which is twice normal, transfuse if hemoglobin less than 7, currently stable. CT abdomen for restaging outpatient home along with outpatient followup with oncology post discharge.  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 Hulen Skains on 02/24/2014, patient to be discharged with home health and wound VAC, will follow with Dr. Barkley Bruns in the office post discharge.  -Noted wound growing gram-negative rods was switched to doxycycline + cipro on 02/25/2014 and monitor temperature curve, leukocytosis improving. Responding well to antibiotics continue for a total of 10 days stop date 03/07/2014.  -pt afebrile, wbc 10.3 Intermittent tachycardic and hypotension  Likely due to fluid removal during HD, along with ongoing fluid loss from his left inguinal drain which is draining between 1-1/2-2 L a day, will supplement with IV fluids as needed, have discussed with surgery, they have no other solution then to continue drainage this time. Per Dr. Barkley Bruns and Hulen Skains no further surgical intervention is required.  He was on steroids for hypotension while in the ICU which have been tapered off .  Type 2 diabetes mellitus  - Continue sliding scale insulin  CBG (last 3)   Recent Labs   03/02/14 0412  03/02/14 0759  03/02/14 1218   GLUCAP  140*  138*  165*    GERD with history of GI bleed: Continue PPI  Deconditioning Pt will need SNF on admission -SW assisting with  placement DVT Prophylaxis: Heparin subcutaneous  Code Status: Full CODE STATUS  Family Communication:  Disposition: Rehabilitation  Consultants:  Surgery  Nephrology  Oncology     Antibiotics:  Doxycycline  Cipro  HPI/Subjective:  Denies any c/o today, no or chest pain nausea or vomiting.  Objective: Filed Vitals:   03/09/14 1300  BP: 90/53  Pulse: 98  Temp: 97.4 F (36.3 C)  Resp: 18    Intake/Output Summary (Last 24 hours) at 03/09/14 1733 Last data filed at 03/09/14 1300  Gross per 24 hour  Intake    960 ml  Output   1750 ml  Net   -790 ml   Filed Weights   03/05/14 1653 03/05/14 2121 03/05/14 2156  Weight: 214 kg (471 lb 12.6 oz) 211.7 kg (466 lb 11.4 oz) 216 kg (476 lb 3.1 oz)    Exam:  General: alert & oriented x 3 In NAD,Morbidly obese  Cardiovascular: RRR, nl S1 s2 Respiratory: Decreased breath sounds at the bases, otherwise clear  Abdomen: soft +BS mild tenderness over mid abdominal area /ND, no masses palpable Extremities: Decreased edema, left groin with wound status post debridement with wound VAC    Data Reviewed: Basic Metabolic Panel:  Recent Labs Lab 03/05/14 0540 03/06/14 0446 03/07/14 0653 03/05/2014 0500 03/09/14 1045  NA 128* 133* 129* 128* 130*  K 5.4* 4.5 4.8 5.6* 5.3  CL 84* 89* 85* 82* 85*  CO2 16* 18* 15* 15* 16*  GLUCOSE 107* 136* 111* 88 152*  BUN 70* 43* 56* 69* 51*  CREATININE 6.64* 4.47* 5.49* 6.39* 4.90*  CALCIUM 9.3 8.8 9.3 9.5 9.3  PHOS 8.1* 5.9* 6.6* 8.5* 6.9*   Liver Function Tests:  Recent Labs Lab 03/05/14 0540 03/06/14 0446 03/07/14 0653 03/02/2014 0500 03/09/14 1045  ALBUMIN 2.0* 1.9* 2.0* 1.9* 2.0*   No results found for this basename: LIPASE, AMYLASE,  in the last 168 hours No results found for this basename: AMMONIA,  in the last 168 hours CBC:  Recent Labs Lab 03/05/14 0500 03/06/14 0445 03/07/14 0653 03/14/2014 0500 03/09/14 1045  WBC 11.6* 10.6* 11.1* 10.3 9.9  NEUTROABS 8.7* 7.9*  8.0* 7.6 6.7  HGB 7.6* 7.4* 7.7* 7.4* 7.5*  HCT 23.2* 22.4* 23.6* 22.8* 23.4*  MCV 83.8 84.2 85.8 85.7 85.7  PLT 131* 123* 131* 128* 121*   Cardiac Enzymes: No results found for this basename: CKTOTAL, CKMB, CKMBINDEX, TROPONINI,  in the last 168 hours BNP (last 3 results)  Recent Labs  02/25/2014 1220  PROBNP 937.1*   CBG:  Recent Labs Lab 02/27/2014 1114 03/10/2014 2232 03/09/14 0813 03/09/14 1214 03/09/14 1723  GLUCAP 90 130* 145* 155* 162*    No results found for this or any previous visit (from the past 240 hour(s)).   Studies: Dg Chest Port 1 View  02/20/2014   CLINICAL DATA:  Status post catheter placement  EXAM: PORTABLE CHEST - 1 VIEW  COMPARISON:  DG CHEST 1V PORT dated 02/24/2014; CT CHEST W/CM dated 12/02/2013; DG CHEST 1 VIEW dated 02/03/2014  FINDINGS: There is a dual-lumen right-sided central venous catheter in satisfactory position. There is a 2.8 cm right lower lobe pulmonary nodule. There is no other focal consolidation, pleural effusion or pneumothorax. Stable cardiac silhouette. Bilateral hilar enlargement  The osseous structures are unremarkable.  IMPRESSION: 1. Satisfactory position of a dialysis catheter. 2. Interval enlargement of  a right lower lobe pulmonary nodule.   Electronically Signed   By: Kathreen Devoid   On: 02/23/2014 13:11   Dg Fluoro Guide Cv Line-no Report  03/09/2014   CLINICAL DATA: insertion diatek in OR# 12   FLOURO GUIDE CV LINE  Fluoroscopy was utilized by the requesting physician.  No radiographic  interpretation.     Scheduled Meds: . bisacodyl  10 mg Rectal Daily  . ciprofloxacin  500 mg Oral Q breakfast  . docusate sodium  200 mg Oral BID  . doxycycline  100 mg Oral Q12H  . feeding supplement (NEPRO CARB STEADY)  237 mL Oral BID BM  . heparin subcutaneous  5,000 Units Subcutaneous 3 times per day  . insulin aspart  0-9 Units Subcutaneous 6 times per day  . multivitamin  1 tablet Oral QHS  . pantoprazole  40 mg Oral Q1200  .  polyethylene glycol  17 g Oral Daily  . sodium bicarbonate  1,300 mg Oral TID  . sodium chloride  3 mL Intravenous Q12H   Continuous Infusions: . sodium chloride 20 mL/hr at 03/07/2014 0930    Active Problems:   NHL (non-Hodgkin's lymphoma)   HTN (hypertension)   Dyslipidemia   Anemia of chronic disease   Morbid obesity   Lower GI bleed   Diabetes mellitus   Tumor lysis syndrome, possibility of   Uremia   SOB (shortness of breath) DDX = CHF vs. PE   Acidosis    Time spent: [facility] Pager [phone]. If 7PM-7AM, please contact night-coverage at [email or web address], password [facility] 03/09/2014, 5:33 PM  LOS: 18 days

## 2014-03-09 NOTE — Progress Notes (Signed)
Physical Therapy Treatment Patient Details Name: Douglas [name] MRN: [id number] DOB: 03-11-1956 Today's Date: 03/09/2014    History of Present Illness 58 yo M with pmh of follicular low-grade non-Hodgkin's lymphoma with large left groin lymphadenopathy diagnosed in 2009 with recent chemotherapy and admission for AKI 3/17-3/20 who presented as a transfer from [place] with 1.5 week history of shortness of breath found to be volume overloaded with hyperkalemia and acute renal failure requiring emergent HD.      PT Comments    Pt very motivated and willing to work; after working on upright sitting balance EOB approx 10 minutes, continued with therex bil LEs in bed, with noted incr muscle activation LLE  Spoke with previous PT re: strategies for more successful sit<>stand transfer training, and will more fully utilize [employer] poles to pull up on, and elevated bed from which to stand to set [place] up for more success with standing next session   Follow Up Recommendations  CIR;Supervision/Assistance - 24 hour     Equipment Recommendations  Rolling walker with 5" wheels    Recommendations for Other Services       Precautions / Restrictions Precautions Precautions: Fall Precaution Comments: 454#. LLE lymphedema, wound VAC Restrictions Weight Bearing Restrictions: No    Mobility  Bed Mobility Overal bed mobility: Needs Assistance Bed Mobility: Rolling Rolling: Mod assist   Supine to sit: +2 for physical assistance;Mod assist Sit to supine: Mod assist;+2 for physical assistance   General bed mobility comments: use of rail for rolling and side to sit with mod assist for left LE off bed and to scoot hips out; to supine assist for left leg in bed and cues/guiding assist of another for upperbody management  Transfers Overall transfer level: Needs assistance Equipment used: 2 person hand held assist (bedrails, RW to give LUE something to ) Transfers: Lateral/Scoot Transfers           Lateral/Scoot Transfers: Mod assist;+2 physical assistance General transfer comment: Difficulty acheiving fully upright sitting to be able to attempt sit to stand; Works on lateral scooting transfer to get closer to [facility] prior to sitting, but unfortunately pt stated he felt like he was sliding out of bed, so we assisted pt to lay back down  Ambulation/Gait                 Stairs            Wheelchair Mobility    Modified Rankin (Stroke Patients Only)       Balance Overall balance assessment: Needs assistance Sitting-balance support: Bilateral upper extremity supported Sitting balance-Leahy Scale: Poor Sitting balance - Comments: Sat EOB apporx 10 minutes with +2 for safety and level of support ranging from close supervision to mod assist; significant  right and posterior bias; Gave cues to lean and reach Left, used foot of bed as a rail, which pt was able to grip X2 but difficulty holding due to R lean; Attempted to help forward lean by using upright pole of barichair and then RW; Pt reported feeling unstable with feet on the floor, felt like they were slipping out from under him Postural control: Right lateral lean;Posterior lean                          Cognition Arousal/Alertness: Awake/alert Behavior During Therapy: WFL for tasks assessed/performed Overall Cognitive Status: Within Functional Limits for tasks assessed  Exercises General Exercises - Lower Extremity Quad Sets: AROM;Both;10 reps;Supine Gluteal Sets: AROM;Both;10 reps;Supine Heel Slides: AAROM;Left;10 reps;Supine Hip ABduction/ADduction: AAROM;10 reps;Left;Supine    General Comments        Pertinent Vitals/Pain no apparent distress Pt report little to no pain when asked    Home Living                      Prior Function            PT Goals (current goals can now be found in the care plan section) Acute Rehab PT Goals Patient Stated  Goal: to get moving PT Goal Formulation: With patient Time For Goal Achievement: 03/16/14 Potential to Achieve Goals: Good Progress towards PT goals: Progressing toward goals    Frequency  Min 3X/week    PT Plan Current plan remains appropriate    Co-evaluation             End of Session Equipment Utilized During Treatment: Other (comment) (Barichair, bed pads) Activity Tolerance: Patient tolerated treatment well Patient left: in bed;with call bell/phone within reach     Time: 1002-1042 PT Time Calculation (min): 40 min  Charges:  $Therapeutic Exercise: 8-22 mins $Therapeutic Activity: 23-37 mins                    G Codes:      [facility] 03/09/2014, 11:27 AM [name], [place] Pager [phone] Office [phone]

## 2014-03-09 NOTE — Progress Notes (Signed)
Patient ID: Douglas [name], male   DOB: 01-Nov-1956, 58 y.o.   MRN: [id number] S:no new complaints O:BP 89/55  Pulse 99  Temp(Src) 97 F (36.1 C) (Oral)  Resp 19  Ht 5' 10.08" (1.78 m)  Wt 216 kg (476 lb 3.1 oz)  BMI 68.17 kg/m2  SpO2 97%  Intake/Output Summary (Last 24 hours) at 03/09/14 1046 Last data filed at 03/09/14 0900  Gross per 24 hour  Intake    420 ml  Output   1750 ml  Net  -1330 ml   Intake/Output: I/O last 3 completed shifts: In: 56 [P.O.:510; I.V.:150] Out: 1960 [Drains:210; Other:1750]  Intake/Output this shift:  Total I/O In: 120 [P.O.:120] Out: 0  Weight change:  Gen:WD obese AAM in NAD CVS:No rub Resp:decreased BS at bases ZDG:LOVFI, NT EPP:IRJJO vac left thigh, + edema/pannus   Recent Labs Lab 03/03/14 0905 03/04/14 0550 03/05/14 0540 03/06/14 0446 03/07/14 0653 Mar 21, 2014 0500  NA 129* 131* 128* 133* 129* 128*  K 4.9 4.8 5.4* 4.5 4.8 5.6*  CL 84* 85* 84* 89* 85* 82*  CO2 16* 15* 16* 18* 15* 15*  GLUCOSE 101* 104* 107* 136* 111* 88  BUN 73* 58* 70* 43* 56* 69*  CREATININE 6.70* 5.57* 6.64* 4.47* 5.49* 6.39*  ALBUMIN 2.0* 2.0* 2.0* 1.9* 2.0* 1.9*  CALCIUM 9.4 9.2 9.3 8.8 9.3 9.5  PHOS 7.2* 6.8* 8.1* 5.9* 6.6* 8.5*   Liver Function Tests:  Recent Labs Lab 03/06/14 0446 03/07/14 0653 03/21/14 0500  ALBUMIN 1.9* 2.0* 1.9*   No results found for this basename: LIPASE, AMYLASE,  in the last 168 hours No results found for this basename: AMMONIA,  in the last 168 hours CBC:  Recent Labs Lab 03/04/14 0550 03/05/14 0500 03/06/14 0445 03/07/14 0653 Mar 21, 2014 0500  WBC 11.5* 11.6* 10.6* 11.1* 10.3  NEUTROABS 8.6* 8.7* 7.9* 8.0* 7.6  HGB 7.8* 7.6* 7.4* 7.7* 7.4*  HCT 23.7* 23.2* 22.4* 23.6* 22.8*  MCV 84.3 83.8 84.2 85.8 85.7  PLT 142* 131* 123* 131* 128*   Cardiac Enzymes: No results found for this basename: CKTOTAL, CKMB, CKMBINDEX, TROPONINI,  in the last 168 hours CBG:  Recent Labs Lab 2014/03/21 0412 03-21-2014 0748  21-Mar-2014 1114 Mar 21, 2014 2232 03/09/14 0813  GLUCAP 100* 93 90 130* 145*    Iron Studies: No results found for this basename: IRON, TIBC, TRANSFERRIN, FERRITIN,  in the last 72 hours Studies/Results: Dg Chest Port 1 View  03-21-14   CLINICAL DATA:  Status post catheter placement  EXAM: PORTABLE CHEST - 1 VIEW  COMPARISON:  DG CHEST 1V PORT dated 02/24/2014; CT CHEST W/CM dated 12/02/2013; DG CHEST 1 VIEW dated 02/03/2014  FINDINGS: There is a dual-lumen right-sided central venous catheter in satisfactory position. There is a 2.8 cm right lower lobe pulmonary nodule. There is no other focal consolidation, pleural effusion or pneumothorax. Stable cardiac silhouette. Bilateral hilar enlargement  The osseous structures are unremarkable.  IMPRESSION: 1. Satisfactory position of a dialysis catheter. 2. Interval enlargement of a right lower lobe pulmonary nodule.   Electronically Signed   By: Kathreen Devoid   On: 03/21/14 13:11   . bisacodyl  10 mg Rectal Daily  . ciprofloxacin  500 mg Oral Q breakfast  . docusate sodium  200 mg Oral BID  . doxycycline  100 mg Oral Q12H  . feeding supplement (NEPRO CARB STEADY)  237 mL Oral BID BM  . heparin subcutaneous  5,000 Units Subcutaneous 3 times per day  . insulin aspart  0-9 Units  Subcutaneous 6 times per day  . multivitamin  1 tablet Oral QHS  . pantoprazole  40 mg Oral Q1200  . polyethylene glycol  17 g Oral Daily  . sodium bicarbonate  1,300 mg Oral TID  . sodium chloride  3 mL Intravenous Q12H    BMET    Component Value Date/Time   NA 128* 03/12/2014 0500   NA 137 01/19/2014 1101   K 5.6* 02/22/2014 0500   K 3.7 01/19/2014 1101   CL 82* 03/11/2014 0500   CO2 15* 02/26/2014 0500   CO2 27 01/19/2014 1101   GLUCOSE 88 03/09/2014 0500   GLUCOSE 204* 01/19/2014 1101   BUN 69* 03/17/2014 0500   BUN 15.8 01/19/2014 1101   CREATININE 6.39* 02/26/2014 0500   CREATININE 0.9 01/19/2014 1101   CALCIUM 9.5 03/01/2014 0500   CALCIUM 9.8 01/19/2014 1101   GFRNONAA 9*  03/01/2014 0500   GFRAA 10* 03/04/2014 0500   CBC    Component Value Date/Time   WBC 10.3 02/18/2014 0500   WBC 7.9 01/19/2014 1101   RBC 2.66* 02/24/2014 0500   RBC 3.02* 01/19/2014 1101   HGB 7.4* 03/05/2014 0500   HGB 8.4* 01/19/2014 1101   HCT 22.8* 03/18/2014 0500   HCT 26.0* 01/19/2014 1101   PLT 128* 02/25/2014 0500   PLT 477* 01/19/2014 1101   MCV 85.7 03/16/2014 0500   MCV 86.1 01/19/2014 1101   MCH 27.8 03/17/2014 0500   MCH 27.9 01/19/2014 1101   MCHC 32.5 03/09/2014 0500   MCHC 32.5 01/19/2014 1101   RDW 19.0* 03/03/2014 0500   RDW 16.2* 01/19/2014 1101   LYMPHSABS 1.2 03/13/2014 0500   LYMPHSABS 0.0* 01/19/2014 1101   MONOABS 1.4* 02/26/2014 0500   MONOABS 0.9 01/19/2014 1101   EOSABS 0.1 03/12/2014 0500   EOSABS 0.0 01/19/2014 1101   BASOSABS 0.0 02/22/2014 0500   BASOSABS 0.0 01/19/2014 1101    Assessment/Plan:  1. AKI presumably due to ATN, has been dialysis-dependent since 02/23/14.  S/p HD yesterday 1. Main issues are that he is too weak for outpt HD (not able to stand or transfer and therefore he is not a candidate for outpt HD at this time, however he is referred to CIR which should help, if however his insurance will not pay for CIR, he would require LTC until he is strong enough to transfer and/or his renal function returns.  I discussed this with Dr. Dillard Essex.  Will cont with IHD for now as well as PT/OT. 2. NHL s/p therapy with Bendamustine and rituximab 02/03/14, plan for ongoing chemo and EPO per HEME/ONC 3. Anemia of malignancy- per HEME/ONC 4. 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 Morbid obesity 6. DM 7. Hypotension 8. Vascular access- s/p tunneld HD cath but will also require AVF/AVG, [facility] has been consulted but no mention of permanent access. Will touch base with them 9. Dispo- as above, not a candidate for outpt HD at this time. He will require CIR or LTC with ongoing HD.   [place]

## 2014-03-09 NOTE — Progress Notes (Signed)
I met with pt at bedside. We discussed an inpt rehab stay to assist in his rehab recovery. I will discuss the possibility of an inpt rehab admission with his [place]. Noted dialysis dependent AKI. 03/07/2014  [phone]

## 2014-03-10 LAB — RENAL FUNCTION PANEL
Albumin: 2.1 g/dL — ABNORMAL LOW (ref 3.5–5.2)
BUN: 31 mg/dL — ABNORMAL HIGH (ref 6–23)
CHLORIDE: 86 meq/L — AB (ref 96–112)
CO2: 17 mEq/L — ABNORMAL LOW (ref 19–32)
Calcium: 8.8 mg/dL (ref 8.4–10.5)
Creatinine, Ser: 3.34 mg/dL — ABNORMAL HIGH (ref 0.50–1.35)
GFR calc Af Amer: 22 mL/min — ABNORMAL LOW (ref >90)
GFR, EST NON AFRICAN AMERICAN: 19 mL/min — AB (ref >90)
Glucose, Bld: 102 mg/dL — ABNORMAL HIGH (ref 70–99)
POTASSIUM: 4.7 meq/L (ref 3.7–5.3)
Phosphorus: 5.1 mg/dL — ABNORMAL HIGH (ref 2.3–4.6)
Sodium: 132 mEq/L — ABNORMAL LOW (ref 137–147)

## 2014-03-10 LAB — CBC WITH DIFFERENTIAL/PLATELET
Basophils Absolute: 0 10*3/uL (ref 0.0–0.1)
Basophils Relative: 0 % (ref 0–1)
EOS PCT: 1 % (ref 0–5)
Eosinophils Absolute: 0.1 10*3/uL (ref 0.0–0.7)
HCT: 22.7 % — ABNORMAL LOW (ref 39.0–52.0)
Hemoglobin: 7.4 g/dL — ABNORMAL LOW (ref 13.0–17.0)
LYMPHS PCT: 15 % (ref 12–46)
Lymphs Abs: 1.7 10*3/uL (ref 0.7–4.0)
MCH: 27.7 pg (ref 26.0–34.0)
MCHC: 32.6 g/dL (ref 30.0–36.0)
MCV: 85 fL (ref 78.0–100.0)
MONOS PCT: 17 % — AB (ref 3–12)
Monocytes Absolute: 1.9 10*3/uL — ABNORMAL HIGH (ref 0.1–1.0)
Neutro Abs: 7.7 10*3/uL (ref 1.7–7.7)
Neutrophils Relative %: 67 % (ref 43–77)
PLATELETS: 105 10*3/uL — AB (ref 150–400)
RBC: 2.67 MIL/uL — AB (ref 4.22–5.81)
RDW: 19.5 % — ABNORMAL HIGH (ref 11.5–15.5)
WBC: 11.4 10*3/uL — AB (ref 4.0–10.5)

## 2014-03-10 LAB — GLUCOSE, CAPILLARY
GLUCOSE-CAPILLARY: 145 mg/dL — AB (ref 70–99)
GLUCOSE-CAPILLARY: 142 mg/dL — AB (ref 70–99)
Glucose-Capillary: 125 mg/dL — ABNORMAL HIGH (ref 70–99)
Glucose-Capillary: 206 mg/dL — ABNORMAL HIGH (ref 70–99)
Glucose-Capillary: 97 mg/dL (ref 70–99)

## 2014-03-10 MED ORDER — HEPARIN SODIUM (PORCINE) 1000 UNIT/ML DIALYSIS: 20.0000 [IU]/kg | INTRAMUSCULAR | Status: DC | PRN

## 2014-03-10 MED ORDER — SODIUM CHLORIDE 0.9 % IV BOLUS (SEPSIS)
250.0000 mL | Freq: Once | INTRAVENOUS | Status: AC
  Administered 2014-03-10: 250 mL via INTRAVENOUS

## 2014-03-10 NOTE — Progress Notes (Signed)
Patient ID: Douglas [name]  male  IZT:[id number]    DOB: 1956/07/01    DOA: 02/22/2014  PCP: [name], MD   Brief interim history   Patient is a 58 year old male with history of follicular low-grade non-Hodgkin lymphoma, large left groin lymphadenopathy, diagnosed in 2000 and was on recent chemotherapy, had admission for acute renal failure 3/17- 3/20. He was suspected to have acute renal failure de to HCTZ and ARB.    He had outpatient left-sided inguinal node biopsy after which he has developed a large lymph filled seroma on the left groin requiring wound VAC placement and continuous drainage, he continues to drain about liter liter and a half on a daily basis, there were some superinfection which is responding well to antibiotics which are presently on board. Have discussed his case multiple times with general surgery who do not recommend any further surgical intervention. Once he has a rehabilitation bed he could be placed in a rehabilitation as he requires intermittent IV fluid boluses for ongoing fluid loss due to daily lymph drainage from the wound VAC.     SIGNIFICANT EVENTS / STUDIES:  4/3 Hyperkalemia, hyperuricemia, hyperphosphatemia, with ARF requiring emergent CRRT. UA with urate crystals. Renal US normal. No pulmonary edema on CXR. Heparin gtt started due to concern for PE. Started IV vanc for left groin wound.  4/4 ARF thought to be due to ATN from severe sepsis, per oncology not tumor lysis syndrome. No DVT, heparin gtt stopped. 2D-echo normal.  4/5 Continue CRRT, to transition to HD, no renal recovery yet. Hyperuricemia resolved. Dialysis access is a right IJ dialysis catheter 4/6  .Wound vac placed 4/7 -hemodialysis initiated      Assessment/Plan:   Principal Problem   Acute kidney injury-potentially related to tumor lysis now ESRD, :  - Etiology prerenal from volume loss from her fistula versus severe sepsis from left groin infection.  Per Oncology  tumor lysis syndrome less likely, Currently undergoing dialysis per renal on a 3 times a week basis. Discussed with renal now likely ESRD.       NHL (non-Hodgkin's lymphoma) with follicular low-grade, large left groin lymph adenopathy status post chemotherapy, hyperuricemia  Hyperuricemia has resolved, oncology following (Dr.Magrinat),  stable G6PD, LDH levels are around 600 which is twice normal, transfuse if hemoglobin less than 7, currently stable.  CT abdomen for restaging outpatient home along with outpatient followup with oncology post discharge.     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 Hulen Skains on 02/24/2014, patient to be discharged with home health and wound VAC, will follow with Dr. Barkley Bruns in the office post discharge.    -Noted wound growing gram-negative rods we'll switch to doxycycline + cipro on 02/25/2014 and monitor temperature curve, leukocytosis improving. Clinically responded well will stop antibiotics on 03/10/2014.     Intermittent  tachycardic and hypotension  Likely due to fluid removal during HD, along with ongoing fluid loss from his left inguinal drain which is draining between 1-1/2-2 L a day, will supplement with IV fluids as needed, have discussed with surgery, they have no other solution then to continue drainage this time. Per Dr. Barkley Bruns and Hulen Skains   no further surgical intervention is required.   He was on steroids for hypotension while in the ICU which have been tapered down last it will be today.      Type 2 diabetes mellitus - Continue sliding scale insulin  CBG (last  3)   Recent Labs  03/09/14 2135 03/10/14 0044 03/10/14 0427  GLUCAP 137* 206* 125*      GERD with history of GI bleed: Continue PPI      DVT Prophylaxis: Heparin subcutaneous   Code Status: Full CODE STATUS  Family Communication:  Disposition: Rehabilitation, CIR  evaluating    Consultants:  Surgery  Nephrology  Oncology    Procedures:  Dialysis  Wound VAC    Antibiotics:  Anti-infectives   Start     Dose/Rate Route Frequency Ordered Stop   03/02/2014 0953  ceFAZolin (ANCEF) 1-5 GM-% IVPB    Comments:  [name]   : cabinet override      03/15/2014 [id number] 03/12/2014 2159   02/22/2014 0953  ceFAZolin (ANCEF) 2-3 GM-% IVPB SOLR    Comments:  [name]   : cabinet override      02/27/2014 0953 03/16/2014 2159   02/18/2014 0600  [MAR Hold]  ceFAZolin (ANCEF) 3 g in dextrose 5 % 50 mL IVPB     (On MAR Hold since 02/26/2014 0850)   3 g 160 mL/hr over 30 Minutes Intravenous On call 03/07/14 1556 03/18/2014 1021   03/12/2014 0000  ceFAZolin (ANCEF) IVPB 1 g/50 mL premix  Status:  Discontinued    Comments:  Send with pt to OR   1 g 100 mL/hr over 30 Minutes Intravenous On call 03/07/14 1540 03/07/14 1555   03/01/14 1800  doxycycline (VIBRA-TABS) tablet 100 mg     100 mg Oral Every 12 hours 03/01/14 1522     03/01/14 0800  ciprofloxacin (CIPRO) tablet 500 mg     500 mg Oral Daily with breakfast 02/28/14 1106     02/26/14 0000  doxycycline (VIBRAMYCIN) 100 MG capsule     100 mg Oral 2 times daily 02/26/14 1153     02/26/14 0000  ciprofloxacin (CIPRO) 500 MG tablet     500 mg Oral 2 times daily 02/26/14 1153     02/25/14 1100  ciprofloxacin (CIPRO) IVPB 400 mg  Status:  Discontinued     400 mg 200 mL/hr over 60 Minutes Intravenous Every 12 hours 02/25/14 1047 02/28/14 1106   02/24/14 1830  doxycycline (VIBRAMYCIN) 100 mg in dextrose 5 % 250 mL IVPB  Status:  Discontinued     100 mg 125 mL/hr over 120 Minutes Intravenous Every 12 hours 02/24/14 1756 03/01/14 1521   02/24/14 1800  cefTRIAXone (ROCEPHIN) 1 g in dextrose 5 % 50 mL IVPB  Status:  Discontinued     1 g 100 mL/hr over 30 Minutes Intravenous Every 24 hours 02/24/14 1231 02/24/14 1756   02/24/14 1200  vancomycin (VANCOCIN) 1,500 mg in sodium chloride 0.9 % 250 mL IVPB  Status:   Discontinued     1,500 mg 250 mL/hr over 60 Minutes Intravenous  Once 02/23/14 1423 02/24/14 1231   02/20/14 2200  vancomycin (VANCOCIN) 1,500 mg in sodium chloride 0.9 % 500 mL IVPB  Status:  Discontinued     1,500 mg 250 mL/hr over 120 Minutes Intravenous Every 24 hours 02/28/2014 2119 02/22/14 1116   02/23/2014 2200  vancomycin (VANCOCIN) 2,000 mg in sodium chloride 0.9 % 500 mL IVPB     2,000 mg 250 mL/hr over 120 Minutes Intravenous  Once 02/24/2014 2119 02/20/14 0005     Subjective:  Patient in bed, no fever chills, no headache chest abdominal pain, no shortness of breath, generalized weakness.  Objective: Weight change:   Intake/Output Summary (Last 24 hours) at 03/10/14  [place] filed at 03/10/14 1100  Gross per 24 hour  Intake    360 ml  Output   1984 ml  Net  -1624 ml   Blood pressure 102/45, pulse 107, temperature 97 F (36.1 C), temperature source Oral, resp. rate 18, height 5' 10.08" (1.78 m), weight 214.1 kg (472 lb 0.1 oz), SpO2 95.00%.  Physical Exam: General: Alert and awake, oriented x3, not in any acute distress. CVS: S1-S2 clear, no murmur rubs or gallops Chest: clear to auscultation bilaterally, no wheezing, rales or rhonchi Abdomen: soft  nondistended, normal bowel sounds  Extremities: no cyanosis, clubbing. 3+ edema noted bilaterally, left groin has a large fluctuant fluid collection with wound VAC drain in place   Lab Results: Basic Metabolic Panel:  Recent Labs Lab 03-29-2014 0500 03/09/14 1045  NA 128* 130*  K 5.6* 5.3  CL 82* 85*  CO2 15* 16*  GLUCOSE 88 152*  BUN 69* 51*  CREATININE 6.39* 4.90*  CALCIUM 9.5 9.3  PHOS 8.5* 6.9*   Liver Function Tests:  Recent Labs Lab 2014/03/29 0500 03/09/14 1045  ALBUMIN 1.9* 2.0*   No results found for this basename: LIPASE, AMYLASE,  in the last 168 hours No results found for this basename: AMMONIA,  in the last 168 hours CBC:  Recent Labs Lab 03/09/14 1045 03/10/14 0500  WBC 9.9 11.4*   NEUTROABS 6.7 7.7  HGB 7.5* 7.4*  HCT 23.4* 22.7*  MCV 85.7 85.0  PLT 121* 105*   Cardiac Enzymes: No results found for this basename: CKTOTAL, CKMB, CKMBINDEX, TROPONINI,  in the last 168 hours BNP: No components found with this basename: POCBNP,  CBG:  Recent Labs Lab 03/09/14 1214 03/09/14 1723 03/09/14 2135 03/10/14 0044 03/10/14 0427  GLUCAP 155* 162* 137* 206* 125*     Micro Results: No results found for this or any previous visit (from the past 240 hour(s)).  Studies/Results: Dg Chest 1 View  02/03/2014   CLINICAL DATA:  Central catheter placement  EXAM: CHEST - 1 VIEW  COMPARISON:  Chest CT December 02, 2013  FINDINGS: Central catheter tip is in the right axillary vein region. There is again noted a 1.7 x 1.3 cm nodular opacity in the right midlung. There is no edema or consolidation. Heart is mildly enlarged with normal pulmonary vascularity. No apparent adenopathy. No pneumothorax. No bone lesions.  IMPRESSION: Central catheter tip in right axillary vein. Nodular opacity right mid lung. No edema or consolidation.   Electronically Signed   By: Lowella Grip M.D.   On: 02/03/2014 07:06   Dg Chest 2 View  02/16/2014   CLINICAL DATA:  2 week history shortness of breath ; the patient has a history of non-Hodgkin's lymphoma  EXAM: CHEST  2 VIEW  COMPARISON:  DG CHEST 1 VIEW dated 02/03/2014  FINDINGS: The lungs are adequately inflated. There is an abnormal soft tissue density nodule that projects in the lower 1/3 of the right hemithorax on the frontal view. It measures 1.4 x 1.9 cm in diameter. It may lie anteriorly in the right middle lobe. This is similar in appearance to that demonstrated on the previous study. Elsewhere no definite pulmonary nodules are demonstrated. There is no alveolar infiltrate. The cardiac silhouette is normal in size. The pulmonary vascularity is not engorged. There is mild degenerative change of the thoracic spine with calcification of the anterior  longitudinal ligament.  IMPRESSION: 1. There is persistent nodularity likely in the right middle lobe. There is no definite evidence  of pneumonia. 2. There is no evidence of CHF. 3. Further evaluation of the thorax with chest CT scanning is recommended. This would allow further evaluation of the mediastinum and hilar regions as well as the pulmonary parenchyma given the patient's symptoms of shortness of breath.   Electronically Signed   By: David  [place]   On: 02/16/2014 10:56   US Renal  03/13/2014   CLINICAL DATA:  Evaluate for hydronephrosis  EXAM: RENAL/URINARY TRACT ULTRASOUND COMPLETE  COMPARISON:  US RENAL dated 02/03/2014; [place] ART/VEN FLOW ABD PELV DOPPLER dated 11/27/2013; US SCROTUM dated 11/27/2013  FINDINGS: Right Kidney:  Length: 11.5 cm. Echogenicity within normal limits. No mass or hydronephrosis visualized.  Left Kidney:  Length: 12.2 cm. Echogenicity within normal limits. No mass or hydronephrosis visualized.  Bladder:  Urinary bladder is decompressed, patient is status post Foley catheter insertion.  IMPRESSION: Negative renal ultrasound.   Electronically Signed   By: Salome Holmes M.D.   On: 03/04/2014 16:09   US Renal  02/03/2014   CLINICAL DATA:  Acute kidney injury.  EXAM: RENAL/URINARY TRACT ULTRASOUND COMPLETE  COMPARISON:  CT ABD/PELVIS W CM dated 10/30/2013  FINDINGS: Right Kidney:  Length: 11.6 cm. Echogenicity within normal limits. No mass or hydronephrosis visualized.  Left Kidney:  Length: 11.8 cm. Echogenicity within normal limits. No mass or hydronephrosis visualized.  Bladder:  Appears normal for degree of bladder distention.  IMPRESSION: Unremarkable study.   Electronically Signed   By: Charlett Nose M.D.   On: 02/03/2014 16:56   Dg Chest Port 1 View  02/23/2014   CLINICAL DATA:  Respiratory distress.  EXAM: PORTABLE CHEST - 1 VIEW  COMPARISON:  DG CHEST 1V PORT dated 02/20/2014; CT CHEST W/CM dated 12/02/2013; DG CHEST 1V PORT dated 03/03/2014; DG CHEST 1V PORT dated 03/01/2014   FINDINGS: Cardiomediastinal silhouette remains stable and normal. Dialysis catheter tips SVC RA junction, unchanged. No focal infiltrates, although right lung nodular densities persist. Negative osseous structures.  IMPRESSION: No active infiltrates.   Electronically Signed   By: Davonna Belling M.D.   On: 02/23/2014 08:10   Dg Chest Port 1 View  02/20/2014   CLINICAL DATA:  Shortness of breath  EXAM: PORTABLE CHEST - 1 VIEW  COMPARISON:  03/01/2014  FINDINGS: A temporary dialysis catheter is again noted on the right. The cardiac shadow is stable. The lungs are well aerated bilaterally without focal infiltrate or sizable effusion.  IMPRESSION: No acute abnormality noted.   Electronically Signed   By: Alcide Clever M.D.   On: 02/20/2014 08:08   Dg Chest Port 1 View  02/20/2014   CLINICAL DATA:  Status post central line placement  EXAM: PORTABLE CHEST - 1 VIEW  COMPARISON:  02/20/2014 1251 hrs  FINDINGS: The cardiac shadow is stable. A temporary dialysis catheter is now seen in the distal superior vena cava. No pneumothorax is noted. Mild atelectatic changes are noted in the right base. This may be related to the poor inspiratory effort.  IMPRESSION: Status post temporary dialysis catheter placement without pneumothorax.   Electronically Signed   By: Alcide Clever M.D.   On: 02/28/2014 19:31   Dg Chest Portable 1 View  02/23/2014   CLINICAL DATA:  Shortness of breath. Non-Hodgkin's lymphoma. Diabetes and hypertension.  EXAM: PORTABLE CHEST - 1 VIEW  COMPARISON:  DG CHEST 2 VIEW dated 02/16/2014; DG CHEST 1 VIEW dated 02/03/2014; CT CHEST W/CM dated 12/02/2013  FINDINGS: The heart is mildly enlarged. Right middle lobe nodule appears larger, now  measuring 2.7 cm. Probable right upper lobe nodule is only partially seen, measuring approximately 1 cm in diameter radiographically. Lesion was larger based on prior CT evaluation. There is slight prominence the left hilar region but this is felt to be stable compared with prior  radiographs and CT. No new consolidation or pleural effusion.  IMPRESSION: Persistent right lung nodules. Right middle lobe nodule is larger ring compared prior studies.   Electronically Signed   By: Shon Hale M.D.   On: 03/14/2014 13:08    Medications: Scheduled Meds: . bisacodyl  10 mg Rectal Daily  . ciprofloxacin  500 mg Oral Q breakfast  . docusate sodium  200 mg Oral BID  . doxycycline  100 mg Oral Q12H  . feeding supplement (NEPRO CARB STEADY)  237 mL Oral BID BM  . heparin subcutaneous  5,000 Units Subcutaneous 3 times per day  . insulin aspart  0-9 Units Subcutaneous 6 times per day  . multivitamin  1 tablet Oral QHS  . pantoprazole  40 mg Oral Q1200  . polyethylene glycol  17 g Oral Daily  . sodium bicarbonate  1,300 mg Oral TID  . sodium chloride  3 mL Intravenous Q12H      LOS: 19 days   Thurnell Lose M.D. [facility] 03/10/2014, 11:16 AM Pager: [phone]  If 7PM-7AM, please contact night-coverage [email or web address] Password [email or web address]

## 2014-03-10 NOTE — Progress Notes (Signed)
Patient ID: Douglas [name], male   DOB: Jun 04, 1956, 58 y.o.   MRN: [id number]  [facility] Progress Note    Subjective:   Patient was seen on dialysis and the procedure was supervised. BFR 400 Via RIJ PC BP is 91/47.  Patient appears to be tolerating treatment well     Objective:   BP 91/47  Pulse 104  Temp(Src) 97.1 F (36.2 C) (Oral)  Resp 18  Ht 5' 10.08" (1.78 m)  Wt 216 kg (476 lb 3.1 oz)  BMI 68.17 kg/m2  SpO2 97%  Intake/Output: I/O last 3 completed shifts: In: 1080 [P.O.:1080] Out: 1750 [Other:1750]   Intake/Output this shift:    Weight change:   Physical Exam: PNT:IRWER AAM in NAD CVS:no rub Resp:cta with decreased bs XVQ:MGQQP, + bs Ext:+ edema, wound vac on left thigh  Labs: BMET  Recent Labs Lab 03/03/14 0905 03/04/14 0550 03/05/14 0540 03/06/14 0446 03/07/14 0653 03/07/2014 0500 03/09/14 1045  NA 129* 131* 128* 133* 129* 128* 130*  K 4.9 4.8 5.4* 4.5 4.8 5.6* 5.3  CL 84* 85* 84* 89* 85* 82* 85*  CO2 16* 15* 16* 18* 15* 15* 16*  GLUCOSE 101* 104* 107* 136* 111* 88 152*  BUN 73* 58* 70* 43* 56* 69* 51*  CREATININE 6.70* 5.57* 6.64* 4.47* 5.49* 6.39* 4.90*  ALBUMIN 2.0* 2.0* 2.0* 1.9* 2.0* 1.9* 2.0*  CALCIUM 9.4 9.2 9.3 8.8 9.3 9.5 9.3  PHOS 7.2* 6.8* 8.1* 5.9* 6.6* 8.5* 6.9*   CBC  Recent Labs Lab 03/07/14 0653 03/04/2014 0500 03/09/14 1045 03/10/14 0500  WBC 11.1* 10.3 9.9 11.4*  NEUTROABS 8.0* 7.6 6.7 PENDING  HGB 7.7* 7.4* 7.5* 7.4*  HCT 23.6* 22.8* 23.4* 22.7*  MCV 85.8 85.7 85.7 85.0  PLT 131* 128* 121* PENDING    @IMGRELPRIORS @ Medications:    . bisacodyl  10 mg Rectal Daily  . ciprofloxacin  500 mg Oral Q breakfast  . docusate sodium  200 mg Oral BID  . doxycycline  100 mg Oral Q12H  . feeding supplement (NEPRO CARB STEADY)  237 mL Oral BID BM  . heparin subcutaneous  5,000 Units Subcutaneous 3 times per day  . insulin aspart  0-9 Units Subcutaneous 6 times per day  . multivitamin  1 tablet Oral QHS   . pantoprazole  40 mg Oral Q1200  . polyethylene glycol  17 g Oral Daily  . sodium bicarbonate  1,300 mg Oral TID  . sodium chloride  3 mL Intravenous Q12H    Assessment/ Plan:   1. Non-Hodgkin's lympohoma s/p Bendamustine and rituximab 02/03/14 2. ESRD: has remained HD-dependent and anuric, presumably due to ischemic ATN without recovery.   3. Anemia:EPO per Heme/onc 4. CKD-MBD:start binders 5. Nutrition:cont with suppl 6. 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  8. Dispo- currently not a candidate for outpt HD until he is strong enough for transferring his weight and agree with CIR.  Will also need permanent access prior to d/c to outpt HD  Donetta Potts 03/10/2014, 8:50 AM

## 2014-03-10 NOTE — Progress Notes (Signed)
Physical Therapy Treatment Patient Details Name: Douglas [name] MRN: [id number] DOB: 23-Dec-1955 Today's Date: 03/10/2014    History of Present Illness 58 yo M with pmh of follicular low-grade non-Hodgkin's lymphoma with large left groin lymphadenopathy diagnosed in 2009 with recent chemotherapy and admission for AKI 3/17-3/20 who presented as a transfer from [place] with 1.5 week history of shortness of breath found to be volume overloaded with hyperkalemia and acute renal failure requiring emergent HD.      PT Comments    Progress with sitting balance, and activity tolerance over last session; Able to work on transfer training with good effort at weight shift and push for sit<>stand; Limited this pm by fatigue, as today was a HD day; Hopeful that he will be able to successfully stand on a non-dialysis day  Continue to recommend comprehensive inpatient rehab (CIR) for post-acute therapy needs.   Follow Up Recommendations  CIR;Supervision/Assistance - 24 hour     Equipment Recommendations  Rolling walker with 5" wheels    Recommendations for Other Services       Precautions / Restrictions Precautions Precautions: Fall Precaution Comments: 454#. LLE lymphedema, wound VAC    Mobility  Bed Mobility Overal bed mobility: Needs Assistance Bed Mobility: Supine to Sit;Sit to Supine     Supine to sit: +2 for physical assistance;Mod assist Sit to supine: Mod assist;+2 for physical assistance   General bed mobility comments: Started with deflation of air mattress; Used rails to angle shoulders and trunk in prep for sitting up; physical assist to move LLE to EOB, and then to clear edge; 2 person handeld assist to pull to sit (which he performed x 3); Once sitting, worked on weight shifting in prep for reciprocal scoot and lateral scoot; Pt with particular difficulty shifting to unweigh L hip for scooting back to square off at EOB  Transfers Overall transfer level: Needs  assistance Equipment used: 2 person hand held assist (and pulled up on rails of barichair positioned facing pt) Transfers: Sit to/from Stand Sit to Stand: +2 physical assistance;Max assist         General transfer comment: Better starting position sitting EOB adn bed elevated to help position feet closer to the bed to decr the amount of forward translation of center of mass to get over feet for standing; Attempted X3 with max effort, however unable to acheive fully upright standing; the third rep pt was able to clear hips from bed  Ambulation/Gait                 Stairs            Wheelchair Mobility    Modified Rankin (Stroke Patients Only)       Balance Overall balance assessment: Needs assistance Sitting-balance support: Single extremity supported;Bilateral upper extremity supported Sitting balance-Leahy Scale: Poor Sitting balance - Comments: Improved sitting balance with better positioing EOB and better full deflation of edges of bed; Pt able to sit and reach for poles to hold in front of him; sat for at least 61minutes                             Cognition Arousal/Alertness: Awake/alert Behavior During Therapy: [facility] for tasks assessed/performed Overall Cognitive Status: Within Functional Limits for tasks assessed                      Exercises      General Comments  Pertinent Vitals/Pain no apparent distress     Home Living                      Prior Function            PT Goals (current goals can now be found in the care plan section) Acute Rehab PT Goals Patient Stated Goal: to get moving PT Goal Formulation: With patient Time For Goal Achievement: 03/16/14 Potential to Achieve Goals: Good Progress towards PT goals: Progressing toward goals    Frequency  Min 3X/week    PT Plan Current plan remains appropriate    Co-evaluation             End of Session Equipment Utilized During Treatment:  Gait belt;Other (comment) (rolled sheet for gait belt) Activity Tolerance: Patient limited by fatigue Patient left: in bed;with call bell/phone within reach     Time: 1330-1359 PT Time Calculation (min): 29 min  Charges:  $Therapeutic Activity: 23-37 mins                    G Codes:      [facility] [place] 03/10/2014, 9:36 PM [name], [place] Pager [phone] Office [phone]

## 2014-03-11 ENCOUNTER — Encounter: Payer: Self-pay | Admitting: Internal Medicine

## 2014-03-11 ENCOUNTER — Encounter (HOSPITAL_COMMUNITY): Payer: Self-pay | Admitting: Vascular Surgery

## 2014-03-11 LAB — RENAL FUNCTION PANEL
Albumin: 1.9 g/dL — ABNORMAL LOW (ref 3.5–5.2)
BUN: 44 mg/dL — AB (ref 6–23)
CHLORIDE: 87 meq/L — AB (ref 96–112)
CO2: 20 meq/L (ref 19–32)
Calcium: 8.8 mg/dL (ref 8.4–10.5)
Creatinine, Ser: 4.3 mg/dL — ABNORMAL HIGH (ref 0.50–1.35)
GFR calc non Af Amer: 14 mL/min — ABNORMAL LOW (ref >90)
GFR, EST AFRICAN AMERICAN: 16 mL/min — AB (ref >90)
Glucose, Bld: 137 mg/dL — ABNORMAL HIGH (ref 70–99)
POTASSIUM: 4.7 meq/L (ref 3.7–5.3)
Phosphorus: 5.6 mg/dL — ABNORMAL HIGH (ref 2.3–4.6)
SODIUM: 133 meq/L — AB (ref 137–147)

## 2014-03-11 LAB — CBC WITH DIFFERENTIAL/PLATELET
BASOS PCT: 0 % (ref 0–1)
Basophils Absolute: 0 10*3/uL (ref 0.0–0.1)
Eosinophils Absolute: 0 10*3/uL (ref 0.0–0.7)
Eosinophils Relative: 0 % (ref 0–5)
HEMATOCRIT: 21.3 % — AB (ref 39.0–52.0)
Hemoglobin: 7 g/dL — ABNORMAL LOW (ref 13.0–17.0)
LYMPHS PCT: 12 % (ref 12–46)
Lymphs Abs: 1.1 10*3/uL (ref 0.7–4.0)
MCH: 28.1 pg (ref 26.0–34.0)
MCHC: 32.9 g/dL (ref 30.0–36.0)
MCV: 85.5 fL (ref 78.0–100.0)
MONOS PCT: 16 % — AB (ref 3–12)
Monocytes Absolute: 1.5 10*3/uL — ABNORMAL HIGH (ref 0.1–1.0)
NEUTROS ABS: 6.9 10*3/uL (ref 1.7–7.7)
Neutrophils Relative %: 72 % (ref 43–77)
Platelets: 93 10*3/uL — ABNORMAL LOW (ref 150–400)
RBC: 2.49 MIL/uL — ABNORMAL LOW (ref 4.22–5.81)
RDW: 19.7 % — ABNORMAL HIGH (ref 11.5–15.5)
WBC: 9.5 10*3/uL (ref 4.0–10.5)

## 2014-03-11 LAB — GLUCOSE, CAPILLARY
GLUCOSE-CAPILLARY: 139 mg/dL — AB (ref 70–99)
GLUCOSE-CAPILLARY: 165 mg/dL — AB (ref 70–99)
Glucose-Capillary: 144 mg/dL — ABNORMAL HIGH (ref 70–99)
Glucose-Capillary: 151 mg/dL — ABNORMAL HIGH (ref 70–99)
Glucose-Capillary: 128 mg/dL — ABNORMAL HIGH (ref 70–99)

## 2014-03-11 LAB — ABO/RH: ABO/RH(D): A POS

## 2014-03-11 LAB — CORTISOL: Cortisol, Plasma: 22.2 ug/dL

## 2014-03-11 LAB — PREPARE RBC (CROSSMATCH)

## 2014-03-11 MED ORDER — MIDODRINE HCL 5 MG PO TABS
5.0000 mg | ORAL_TABLET | Freq: Three times a day (TID) | ORAL | Status: DC
  Administered 2014-03-11 – 2014-03-15 (×12): 5 mg via ORAL
  Filled 2014-03-11 (×15): qty 1

## 2014-03-11 MED ORDER — INSULIN ASPART 100 UNIT/ML ~~LOC~~ SOLN
0.0000 [IU] | Freq: Three times a day (TID) | SUBCUTANEOUS | Status: DC
  Administered 2014-03-12 – 2014-03-13 (×2): 1 [IU] via SUBCUTANEOUS
  Administered 2014-03-13: 2 [IU] via SUBCUTANEOUS
  Administered 2014-03-13 – 2014-03-15 (×4): 1 [IU] via SUBCUTANEOUS
  Administered 2014-03-16 (×3): 2 [IU] via SUBCUTANEOUS
  Administered 2014-03-17: 1 [IU] via SUBCUTANEOUS
  Administered 2014-03-17: 2 [IU] via SUBCUTANEOUS
  Administered 2014-03-18 – 2014-03-19 (×4): 1 [IU] via SUBCUTANEOUS
  Administered 2014-03-20: 2 [IU] via SUBCUTANEOUS
  Administered 2014-03-21 – 2014-03-24 (×9): 1 [IU] via SUBCUTANEOUS
  Administered 2014-03-25: 2 [IU] via SUBCUTANEOUS
  Administered 2014-03-25: 1 [IU] via SUBCUTANEOUS
  Administered 2014-03-25: 2 [IU] via SUBCUTANEOUS
  Administered 2014-03-26: 1 [IU] via SUBCUTANEOUS
  Administered 2014-03-26: 2 [IU] via SUBCUTANEOUS
  Administered 2014-03-27 (×2): 3 [IU] via SUBCUTANEOUS
  Administered 2014-03-27: 2 [IU] via SUBCUTANEOUS

## 2014-03-11 MED ORDER — SODIUM CHLORIDE 0.9 % IV BOLUS (SEPSIS)
500.0000 mL | Freq: Once | INTRAVENOUS | Status: AC
  Administered 2014-03-11: 250 mL via INTRAVENOUS

## 2014-03-11 MED ORDER — DIPHENHYDRAMINE HCL 50 MG/ML IJ SOLN
25.0000 mg | Freq: Four times a day (QID) | INTRAMUSCULAR | Status: DC | PRN
  Administered 2014-03-12 – 2014-03-26 (×3): 25 mg via INTRAVENOUS
  Filled 2014-03-11 (×2): qty 1

## 2014-03-11 NOTE — Progress Notes (Signed)
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      PT Comments    Patient seems weaker since this PT last worked with him.  Possibly due to anemia with pt receiving PRBC's during treatment.  Feel he remains motivated and will be able to participate at the level of CIR once his fluid levels can equilibrate.  Will continue acute level therapies and progress as tolerated.  Follow Up Recommendations  CIR;Supervision/Assistance - 24 hour     Equipment Recommendations  Rolling walker with 5" wheels    Recommendations for Other Services       Precautions / Restrictions Precautions Precautions: Fall Precaution Comments: 454#. LLE lymphedema, wound VAC Restrictions Weight Bearing Restrictions: No    Mobility  Bed Mobility Overal bed mobility: Needs Assistance Bed Mobility: Supine to Sit Rolling: Mod assist;+2 for physical assistance   Supine to sit: +2 for physical assistance;Mod assist Sit to supine: +2 for physical assistance   General bed mobility comments: cues for using rails to roll and assist with turning hips; assist for up to sit one person assist with legs and two with trunk  Transfers Overall transfer level: Needs assistance Equipment used: 2 person hand held assist (and assist pulling up on poles on barichair) Transfers: Sit to/from Stand Sit to Stand: +2 physical assistance;Max assist         General transfer comment: attempted to stand from bed x 4 wtih elevated surface, momentum strategy and assist  of 3, but pt unable to achieve full upright standing (sit>squat).  Bed to recliner via sky lift  Ambulation/Gait                 Stairs            Wheelchair Mobility    Modified Rankin (Stroke Patients Only)       Balance   Sitting-balance support: Bilateral upper extremity supported;Single extremity supported;Feet supported Sitting balance-Leahy Scale: Poor Sitting balance - Comments: UE assist for balance at edge of deflated bed and lower bed rail removed; cues for anterior weight shift; sat about 10 minutes total                            Cognition Arousal/Alertness: Awake/alert Behavior During Therapy: WFL for tasks assessed/performed Overall Cognitive Status: Within Functional Limits for tasks assessed                      Exercises Other Exercises Other Exercises: Pt instructed to perform B UE exercises with  level 2 theraband  when finished with PT    General Comments        Pertinent Vitals/Pain Min c/o left LE with movement    Home Living                      Prior Function            PT Goals (current goals can now be found in the care plan section) Progress towards PT goals: Progressing toward goals  Frequency  Min 3X/week    PT Plan Current plan remains appropriate    Co-evaluation PT/OT/SLP Co-Evaluation/Treatment: Yes Reason for Co-Treatment: Complexity of the patient's impairments (multi-system involvement);For patient/therapist safety PT goals addressed during session: Mobility/safety with mobility;Balance OT goals addressed during session: Other (comment)     End of Session Equipment Utilized During Treatment:  (rolled sheet for gait belt) Activity Tolerance: Patient limited by fatigue Patient left: in chair;with call bell/phone within reach;Other (comment) (lift pad in place)     Time: 3500-9381 PT Time Calculation (min): 43 min  Charges:  $Therapeutic Activity: 23-37 mins                     G Codes:      Max Sane 03/26/2014, 5:03 PM Magda Kiel, [place] 04/03/2014

## 2014-03-11 NOTE — Progress Notes (Signed)
Patient ID: Douglas [name], male   DOB: August 12, 1956, 58 y.o.   MRN: [id number]  [facility] Progress Note    Subjective:   No new complaint   Objective:   BP 101/57  Pulse 95  Temp(Src) 98.1 F (36.7 C) (Oral)  Resp 17  Ht 5' 10.08" (1.78 m)  Wt 214.1 kg (472 lb 0.1 oz)  BMI 67.57 kg/m2  SpO2 99%  Intake/Output: I/O last 3 completed shifts: In: 6 [P.O.:420] Out: 2034 [Drains:50; SNKNL:9767]   Intake/Output this shift:    Weight change: -1.9 kg (-4 lb 3 oz)  Physical Exam: HAL:PFXTK AAM in NAD CVS:no rub Resp:decreased BS WIO:XBDZH +BS, soft Ext:+edema, wound vac in place on left thigh  Labs: BMET  Recent Labs Lab 03/05/14 0540 03/06/14 0446 03/07/14 0653 03/12/2014 0500 03/09/14 1045 03/10/14 1250 03/11/14 0544  NA 128* 133* 129* 128* 130* 132* 133*  K 5.4* 4.5 4.8 5.6* 5.3 4.7 4.7  CL 84* 89* 85* 82* 85* 86* 87*  CO2 16* 18* 15* 15* 16* 17* 20  GLUCOSE 107* 136* 111* 88 152* 102* 137*  BUN 70* 43* 56* 69* 51* 31* 44*  CREATININE 6.64* 4.47* 5.49* 6.39* 4.90* 3.34* 4.30*  ALBUMIN 2.0* 1.9* 2.0* 1.9* 2.0* 2.1* 1.9*  CALCIUM 9.3 8.8 9.3 9.5 9.3 8.8 8.8  PHOS 8.1* 5.9* 6.6* 8.5* 6.9* 5.1* 5.6*   CBC  Recent Labs Lab March 12, 2014 0500 03/09/14 1045 03/10/14 0500 03/11/14 0544  WBC 10.3 9.9 11.4* 9.5  NEUTROABS 7.6 6.7 7.7 6.9  HGB 7.4* 7.5* 7.4* 7.0*  HCT 22.8* 23.4* 22.7* 21.3*  MCV 85.7 85.7 85.0 85.5  PLT 128* 121* 105* 93*    @IMGRELPRIORS @ Medications:    . bisacodyl  10 mg Rectal Daily  . docusate sodium  200 mg Oral BID  . feeding supplement (NEPRO CARB STEADY)  237 mL Oral BID BM  . heparin subcutaneous  5,000 Units Subcutaneous 3 times per day  . insulin aspart  0-9 Units Subcutaneous 6 times per day  . midodrine  5 mg Oral TID WC  . multivitamin  1 tablet Oral QHS  . pantoprazole  40 mg Oral Q1200  . polyethylene glycol  17 g Oral Daily  . sodium bicarbonate  1,300 mg Oral TID  . sodium chloride  500 mL  Intravenous Once  . sodium chloride  3 mL Intravenous Q12H     Assessment/ Plan:   1. ABLA/Anemia of malignancy and CKD:EPO per Heme/onc 1. Agree with blood transfusion either today or with HD tomorrow 2. Non-Hodgkin's lympohoma s/p Bendamustine and rituximab 02/03/14 3. ESRD: has remained HD-dependent and anuric, presumably due to ischemic ATN without recovery.  1. Cont with HD 3 x week.  Will plan for MWF for now but when ready to transfer to CIR will change to TThSat 4. CKD-MBD:start binders 5. Nutrition:cont with suppl 6. 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  8. Vascular access- has RIJ PC but will need AVF/AVG once stabilized. 9. Dispo- currently not a candidate for outpt HD until he is strong enough for transferring his weight and agree with CIR. Will also need permanent access prior to d/c to outpt HD [place] 03/11/2014, 11:06 AM

## 2014-03-11 NOTE — Progress Notes (Signed)
Patient ID: Douglas [name]  male  IRC:[id number]    DOB: 1956-02-22    DOA: 03/04/2014  PCP: [name], MD    Brief interim history   Patient is a 58 year old male with history of follicular low-grade non-Hodgkin lymphoma, large left groin lymphadenopathy, diagnosed in 2000 and was on recent chemotherapy, had admission for acute renal failure 3/17- 3/20. He was suspected to have acute renal failure de to HCTZ and ARB.    He had outpatient left-sided inguinal node biopsy after which he has developed a large lymph filled seroma on the left groin requiring wound VAC placement and continuous drainage, he continues to drain about liter liter and a half on a daily basis, there were some superinfection which is responding well to antibiotics which are presently on board. Have discussed his case multiple times with general surgery who do not recommend any further surgical intervention. Once he has a rehabilitation bed he could be placed in a rehabilitation as he requires intermittent IV fluid boluses for ongoing fluid loss due to daily lymph drainage from the wound VAC.      SIGNIFICANT EVENTS / STUDIES:  4/3 Hyperkalemia, hyperuricemia, hyperphosphatemia, with ARF requiring emergent CRRT. UA with urate crystals. Renal US normal. No pulmonary edema on CXR. Heparin gtt started due to concern for PE. Started IV vanc for left groin wound.  4/4 ARF thought to be due to ATN from severe sepsis, per oncology not tumor lysis syndrome. No DVT, heparin gtt stopped. 2D-echo normal.  4/5 Continue CRRT, to transition to HD, no renal recovery yet. Hyperuricemia resolved. Dialysis access is a right IJ dialysis catheter 4/6  .Wound vac placed 4/7 -hemodialysis initiated      Assessment/Plan:   Principal Problem   Acute kidney injury-potentially related to tumor lysis now ESRD :   - Etiology prerenal from volume loss from her fistula versus severe sepsis from left groin infection.  Per Oncology  tumor lysis syndrome less likely, Currently undergoing dialysis per renal on a 3 times a week basis. Discussed with renal now likely ESRD.       NHL (non-Hodgkin's lymphoma) with follicular low-grade, large left groin lymph adenopathy status post chemotherapy, hyperuricemia  Hyperuricemia has resolved, oncology following (Dr.Magrinat),  stable G6PD, LDH levels are around 600 which is twice normal, transfuse PRBCs as needed.  CT abdomen for restaging outpatient home along with outpatient followup with oncology post discharge.     Anemia of chronic disease.  Since his blood pressures are low, will transfuse one unit on 03/11/2014 his hemoglobin is 7, no signs of acute bleed, his gradual and H&H can be explained by multiple blood draws throughout his long hospital stay. Will transfuse another unit on 03/12/2014 with dialysis and monitor.     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 Hulen Skains on 02/24/2014, patient to be discharged with home health and wound VAC, will follow with Dr. Barkley Bruns in the office post discharge.    -Noted wound growing gram-negative rods we'll switch to doxycycline + cipro on 02/25/2014 and monitor temperature curve, leukocytosis improving. Clinically responded well will stop antibiotics on 03/10/2014.     Intermittent  tachycardic and hypotension  Likely due to fluid removal during HD, along with ongoing fluid loss from his left inguinal drain , will supplement with IV fluids as needed, have discussed with surgery, they have no other solution then to continue drainage this time. Per Dr. Barkley Bruns and  [place]   no further surgical intervention is required. Steroids were tapered off. Will place him on midodrine for now and monitor. Will check a random cortisol.  Since his blood pressures are low on 03/11/2014 in the setting of anemia and hemoglobin of around 7 he will get 1 unit packed RBC  transfused on 03/11/2014 and one unit on 03/12/2014 with dialysis.      Type 2 diabetes mellitus - Continue sliding scale insulin  CBG (last 3)   Recent Labs  03/10/14 1948 03/11/14 0057 03/11/14 0733  GLUCAP 142* 165* 139*      GERD with history of GI bleed: Continue PPI      DVT Prophylaxis: Heparin subcutaneous   Code Status: Full CODE STATUS  Family Communication:  Disposition: Rehabilitation, CIR evaluating    Consultants:  Surgery  Nephrology  Oncology    Procedures:  Dialysis  Wound VAC    Antibiotics:  Anti-infectives   Start     Dose/Rate Route Frequency Ordered Stop   03-26-14 0953  ceFAZolin (ANCEF) 1-5 GM-% IVPB    Comments:  Merril Abbe   : cabinet override      03-26-2014 4097 Mar 26, 2014 2159   Mar 26, 2014 0953  ceFAZolin (ANCEF) 2-3 GM-% IVPB SOLR    Comments:  Merril Abbe   : cabinet override      03-26-14 0953 03/26/14 2159   Mar 26, 2014 0600  [MAR Hold]  ceFAZolin (ANCEF) 3 g in dextrose 5 % 50 mL IVPB     (On MAR Hold since 03-26-14 0850)   3 g 160 mL/hr over 30 Minutes Intravenous On call 03/07/14 1556 2014-03-26 1021   03-26-2014 0000  ceFAZolin (ANCEF) IVPB 1 g/50 mL premix  Status:  Discontinued    Comments:  Send with pt to OR   1 g 100 mL/hr over 30 Minutes Intravenous On call 03/07/14 1540 03/07/14 1555   03/01/14 1800  doxycycline (VIBRA-TABS) tablet 100 mg  Status:  Discontinued     100 mg Oral Every 12 hours 03/01/14 1522 03/10/14 1119   03/01/14 0800  ciprofloxacin (CIPRO) tablet 500 mg  Status:  Discontinued     500 mg Oral Daily with breakfast 02/28/14 1106 03/10/14 1119   02/26/14 0000  doxycycline (VIBRAMYCIN) 100 MG capsule     100 mg Oral 2 times daily 02/26/14 1153     02/26/14 0000  ciprofloxacin (CIPRO) 500 MG tablet     500 mg Oral 2 times daily 02/26/14 1153     02/25/14 1100  ciprofloxacin (CIPRO) IVPB 400 mg  Status:  Discontinued     400 mg 200 mL/hr over 60 Minutes Intravenous Every 12 hours 02/25/14  1047 02/28/14 1106   02/24/14 1830  doxycycline (VIBRAMYCIN) 100 mg in dextrose 5 % 250 mL IVPB  Status:  Discontinued     100 mg 125 mL/hr over 120 Minutes Intravenous Every 12 hours 02/24/14 1756 03/01/14 1521   02/24/14 1800  cefTRIAXone (ROCEPHIN) 1 g in dextrose 5 % 50 mL IVPB  Status:  Discontinued     1 g 100 mL/hr over 30 Minutes Intravenous Every 24 hours 02/24/14 1231 02/24/14 1756   02/24/14 1200  vancomycin (VANCOCIN) 1,500 mg in sodium chloride 0.9 % 250 mL IVPB  Status:  Discontinued     1,500 mg 250 mL/hr over 60 Minutes Intravenous  Once 02/23/14 1423 02/24/14 1231   02/20/14 2200  vancomycin (VANCOCIN) 1,500 mg in sodium chloride 0.9 % 500 mL IVPB  Status:  Discontinued  1,500 mg 250 mL/hr over 120 Minutes Intravenous Every 24 hours 03/17/2014 2119 02/22/14 1116   02/28/2014 2200  vancomycin (VANCOCIN) 2,000 mg in sodium chloride 0.9 % 500 mL IVPB     2,000 mg 250 mL/hr over 120 Minutes Intravenous  Once 03/18/2014 2119 02/20/14 0005     Subjective:  Patient in bed, no fever chills, no headache chest abdominal pain, no shortness of breath, generalized weakness.  Objective: Weight change: -1.9 kg (-4 lb 3 oz)  Intake/Output Summary (Last 24 hours) at 03/11/14 1011 Last data filed at 03/11/14 0658  Gross per 24 hour  Intake    420 ml  Output   2034 ml  Net  -1614 ml   Blood pressure 66/45, pulse 88, temperature 98.1 F (36.7 C), temperature source Oral, resp. rate 17, height 5' 10.08" (1.78 m), weight 214.1 kg (472 lb 0.1 oz), SpO2 99.00%.  Physical Exam: General: Alert and awake, oriented x3, not in any acute distress. CVS: S1-S2 clear, no murmur rubs or gallops Chest: clear to auscultation bilaterally, no wheezing, rales or rhonchi Abdomen: soft  nondistended, normal bowel sounds  Extremities: no cyanosis, clubbing. 3+ edema noted bilaterally, left groin has a large fluctuant fluid collection with wound VAC drain in place   Lab Results: Basic Metabolic  Panel:  Recent Labs Lab 03/10/14 1250 03/11/14 0544  NA 132* 133*  K 4.7 4.7  CL 86* 87*  CO2 17* 20  GLUCOSE 102* 137*  BUN 31* 44*  CREATININE 3.34* 4.30*  CALCIUM 8.8 8.8  PHOS 5.1* 5.6*   Liver Function Tests:  Recent Labs Lab 03/10/14 1250 03/11/14 0544  ALBUMIN 2.1* 1.9*   No results found for this basename: LIPASE, AMYLASE,  in the last 168 hours No results found for this basename: AMMONIA,  in the last 168 hours CBC:  Recent Labs Lab 03/10/14 0500 03/11/14 0544  WBC 11.4* 9.5  NEUTROABS 7.7 6.9  HGB 7.4* 7.0*  HCT 22.7* 21.3*  MCV 85.0 85.5  PLT 105* 93*   Cardiac Enzymes: No results found for this basename: CKTOTAL, CKMB, CKMBINDEX, TROPONINI,  in the last 168 hours BNP: No components found with this basename: POCBNP,  CBG:  Recent Labs Lab 03/10/14 1218 03/10/14 1628 03/10/14 1948 03/11/14 0057 03/11/14 0733  GLUCAP 97 145* 142* 165* 139*     Micro Results: No results found for this or any previous visit (from the past 240 hour(s)).  Studies/Results: Dg Chest 1 View  02/03/2014   CLINICAL DATA:  Central catheter placement  EXAM: CHEST - 1 VIEW  COMPARISON:  Chest CT December 02, 2013  FINDINGS: Central catheter tip is in the right axillary vein region. There is again noted a 1.7 x 1.3 cm nodular opacity in the right midlung. There is no edema or consolidation. Heart is mildly enlarged with normal pulmonary vascularity. No apparent adenopathy. No pneumothorax. No bone lesions.  IMPRESSION: Central catheter tip in right axillary vein. Nodular opacity right mid lung. No edema or consolidation.   Electronically Signed   By: Lowella Grip M.D.   On: 02/03/2014 07:06   Dg Chest 2 View  02/16/2014   CLINICAL DATA:  2 week history shortness of breath ; the patient has a history of non-Hodgkin's lymphoma  EXAM: CHEST  2 VIEW  COMPARISON:  DG CHEST 1 VIEW dated 02/03/2014  FINDINGS: The lungs are adequately inflated. There is an abnormal soft tissue  density nodule that projects in the lower 1/3 of the right hemithorax on  the frontal view. It measures 1.4 x 1.9 cm in diameter. It may lie anteriorly in the right middle lobe. This is similar in appearance to that demonstrated on the previous study. Elsewhere no definite pulmonary nodules are demonstrated. There is no alveolar infiltrate. The cardiac silhouette is normal in size. The pulmonary vascularity is not engorged. There is mild degenerative change of the thoracic spine with calcification of the anterior longitudinal ligament.  IMPRESSION: 1. There is persistent nodularity likely in the right middle lobe. There is no definite evidence of pneumonia. 2. There is no evidence of CHF. 3. Further evaluation of the thorax with chest CT scanning is recommended. This would allow further evaluation of the mediastinum and hilar regions as well as the pulmonary parenchyma given the patient's symptoms of shortness of breath.   Electronically Signed   By: David  Martinique   On: 02/16/2014 10:56   US Renal  2014/02/24   CLINICAL DATA:  Evaluate for hydronephrosis  EXAM: RENAL/URINARY TRACT ULTRASOUND COMPLETE  COMPARISON:  US RENAL dated 02/03/2014; [place] ART/VEN FLOW ABD PELV DOPPLER dated 11/27/2013; US SCROTUM dated 11/27/2013  FINDINGS: Right Kidney:  Length: 11.5 cm. Echogenicity within normal limits. No mass or hydronephrosis visualized.  Left Kidney:  Length: 12.2 cm. Echogenicity within normal limits. No mass or hydronephrosis visualized.  Bladder:  Urinary bladder is decompressed, patient is status post Foley catheter insertion.  IMPRESSION: Negative renal ultrasound.   Electronically Signed   By: Margaree Mackintosh M.D.   On: February 24, 2014 16:09   US Renal  02/03/2014   CLINICAL DATA:  Acute kidney injury.  EXAM: RENAL/URINARY TRACT ULTRASOUND COMPLETE  COMPARISON:  CT ABD/PELVIS W CM dated 10/30/2013  FINDINGS: Right Kidney:  Length: 11.6 cm. Echogenicity within normal limits. No mass or hydronephrosis visualized.  Left  Kidney:  Length: 11.8 cm. Echogenicity within normal limits. No mass or hydronephrosis visualized.  Bladder:  Appears normal for degree of bladder distention.  IMPRESSION: Unremarkable study.   Electronically Signed   By: Rolm Baptise M.D.   On: 02/03/2014 16:56   Dg Chest Port 1 View  02/23/2014   CLINICAL DATA:  Respiratory distress.  EXAM: PORTABLE CHEST - 1 VIEW  COMPARISON:  DG CHEST 1V PORT dated 02/20/2014; CT CHEST W/CM dated 12/02/2013; DG CHEST 1V PORT dated February 24, 2014; DG CHEST 1V PORT dated 2014/02/24  FINDINGS: Cardiomediastinal silhouette remains stable and normal. Dialysis catheter tips SVC RA junction, unchanged. No focal infiltrates, although right lung nodular densities persist. Negative osseous structures.  IMPRESSION: No active infiltrates.   Electronically Signed   By: Rolla Flatten M.D.   On: 02/23/2014 08:10   Dg Chest Port 1 View  02/20/2014   CLINICAL DATA:  Shortness of breath  EXAM: PORTABLE CHEST - 1 VIEW  COMPARISON:  2014-02-24  FINDINGS: A temporary dialysis catheter is again noted on the right. The cardiac shadow is stable. The lungs are well aerated bilaterally without focal infiltrate or sizable effusion.  IMPRESSION: No acute abnormality noted.   Electronically Signed   By: Inez Catalina M.D.   On: 02/20/2014 08:08   Dg Chest Port 1 View  2014-02-24   CLINICAL DATA:  Status post central line placement  EXAM: PORTABLE CHEST - 1 VIEW  COMPARISON:  02-24-2014 1251 hrs  FINDINGS: The cardiac shadow is stable. A temporary dialysis catheter is now seen in the distal superior vena cava. No pneumothorax is noted. Mild atelectatic changes are noted in the right base. This may be related to the poor  inspiratory effort.  IMPRESSION: Status post temporary dialysis catheter placement without pneumothorax.   Electronically Signed   By: Inez Catalina M.D.   On: 02/25/2014 19:31   Dg Chest Portable 1 View  03/03/2014   CLINICAL DATA:  Shortness of breath. Non-Hodgkin's lymphoma. Diabetes and  hypertension.  EXAM: PORTABLE CHEST - 1 VIEW  COMPARISON:  DG CHEST 2 VIEW dated 02/16/2014; DG CHEST 1 VIEW dated 02/03/2014; CT CHEST W/CM dated 12/02/2013  FINDINGS: The heart is mildly enlarged. Right middle lobe nodule appears larger, now measuring 2.7 cm. Probable right upper lobe nodule is only partially seen, measuring approximately 1 cm in diameter radiographically. Lesion was larger based on prior CT evaluation. There is slight prominence the left hilar region but this is felt to be stable compared with prior radiographs and CT. No new consolidation or pleural effusion.  IMPRESSION: Persistent right lung nodules. Right middle lobe nodule is larger ring compared prior studies.   Electronically Signed   By: Shon Hale M.D.   On: 02/23/2014 13:08    Medications: Scheduled Meds: . bisacodyl  10 mg Rectal Daily  . docusate sodium  200 mg Oral BID  . feeding supplement (NEPRO CARB STEADY)  237 mL Oral BID BM  . heparin subcutaneous  5,000 Units Subcutaneous 3 times per day  . insulin aspart  0-9 Units Subcutaneous 6 times per day  . midodrine  5 mg Oral TID WC  . multivitamin  1 tablet Oral QHS  . pantoprazole  40 mg Oral Q1200  . polyethylene glycol  17 g Oral Daily  . sodium bicarbonate  1,300 mg Oral TID  . sodium chloride  500 mL Intravenous Once  . sodium chloride  3 mL Intravenous Q12H      LOS: 20 days   Thurnell Lose M.D. [facility] 03/11/2014, 10:11 AM Pager: [phone]  If 7PM-7AM, please contact night-coverage [email or web address] Password [email or web address]

## 2014-03-11 NOTE — Clinical Social Work Note (Addendum)
03/11/14 - Patient will not be able to discharge to a skilled facility - see MD and nephrology notes. Inpatient rehab is following patient and will admit once patient has made more functional gains with therapy  03/12/14 - Handoff prepared for covering CSW.  Kevionna Heffler Givens, MSW, LCSW [phone]

## 2014-03-11 NOTE — Progress Notes (Signed)
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     OT comments  Pt continues to require extensive assist with mobility, +2-3 person assist. Pt required extensive time to comlete bed mobility to sit EOB, attempted sit - stand at EOB x 4 but was unable to stand with +2 person assist and support for UEs. Pt transferred to recliner with Maximove and required extensive/total A for positioning  Follow Up Recommendations  CIR    Equipment Recommendations  None recommended by OT    Recommendations for Other Services      Precautions / Restrictions Precautions Precautions: Fall Precaution Comments: 454#. LLE lymphedema, wound VAC Restrictions Weight Bearing Restrictions: No       Mobility Bed Mobility Overal bed mobility: Needs Assistance Bed Mobility: Supine to Sit;Sit to Supine Rolling: Mod assist;+2 for physical assistance   Supine to sit: +2 for physical assistance;Mod assist Sit to supine: +2 for physical assistance   General bed mobility comments: assist with LEs and UB  Transfers Overall transfer level: Needs assistance   Transfers: Sit to/from Stand Sit to Stand: +2 physical assistance;Max assist         General transfer comment: attempted sit - stand x 4 from EOB, unable to transfer without Maxi move. Transferred with Maxi move and total A +2  for positioning in recliner    Balance   Sitting-balance support: Bilateral upper extremity supported;Single extremity supported;Feet supported Sitting  balance-Leahy Scale: Poor                             ADL                               Toileting- Clothing Manipulation and Hygiene: Bed level;Total assistance         General ADL Comments: Pt assisted wihth rolling in bed for hygiene from being on bed pan. ADLs not addressed this session, pt required extensive time and multipe staff assist for bed mobility, sit - stand x 4 and transfer with Maxi move and positioning in recliner                                      Cognition   Behavior During Therapy: [facility] for tasks assessed/performed Overall Cognitive Status: Within Functional Limits for tasks assessed                                      Exercises Other Exercises Other Exercises: Pt instructed to perform B UE exercises with  level 2 theraband  when finished with PT                Pertinent Vitals/ Pain       No c/o pain, VSS  Frequency Min 2X/week     Progress Toward Goals  OT Goals(current goals can now be found in the care plan section)  Progress towards OT goals: OT to reassess next treatment     Plan Discharge plan remains appropriate    Co-evaluation    PT/OT/SLP Co-Evaluation/Treatment: Yes Reason for Co-Treatment: Complexity of the patient's impairments (multi-system involvement);For patient/therapist safety   OT goals addressed during session: Other (comment)      End of Session     Activity Tolerance Patient limited by fatigue   Patient Left with call bell/phone within reach;in chair;Other (comment) (with PT staff)             Time: 3845-3646 OT Time Calculation (min): 34 min  Charges: OT General Charges $OT Visit: 1 Procedure OT Treatments $Therapeutic Activity: 8-22 mins  Mosetta Putt 03/11/2014, 3:26 PM

## 2014-03-11 NOTE — Progress Notes (Signed)
Pt not yet at a level to tolerate intense inpt rehab. I will continue to follow his progress to then begin [employer] approval once he has made more functional gains with therapy. Please call me with any questions. [phone]

## 2014-03-11 NOTE — Progress Notes (Signed)
NUTRITION FOLLOW-UP  DOCUMENTATION CODES Per approved criteria  -Morbid Obesity   INTERVENTION: Continue Nepro Shake po BID, each supplement provides 425 kcal and 19 grams protein.  RD provided diet education this visit; provided "Kidney Disease Food Pyramid" and discussed need for low potassium, sodium and phosphorus foods. RD to continue to follow nutrition care plan.  NUTRITION DIAGNOSIS: Increased nutrient needs related to wound healing and HD as evidenced by estimated needs. Ongoing.  Goal: Intake to meet >90% of estimated nutrition needs.  Monitor:  weight trends, lab trends, I/O's, PO intake, supplement tolerance  ASSESSMENT: PMHx significant for DM, HTN, dyslipidemia, morbid obesity, low-grade follicular NHL - supposed to have palliative XRT, receiving chemotherapy. Admitted with LLE swelling and increased SOB. Work-up reveals AKI.  Received CRRT 4/3 - 4/5. IHD initiated 4/7. Underwent HD catheter 4/20.  Pt with nonhealing L inguinal wound s/p bx in Dec 2014 and I&D in Mar 2015. VAC placed 4/6, dressing changes 2 x week.  Ordered for Renal Diet, eating poorly consuming 25% of meals. Pt reports that his appetite is great, however he doesn't like the food. He enjoys the [facility], has tried [employer] and [place], and is agreeable to consuming these while on HD. Pt asking for diet education, RD provided overview of renal diet during this visit. Pt asking appropriate questions.  Sodium low at 133 Potassium WNL Phosphorus elevated at 5.6  Height: Ht Readings from Last 1 Encounters:  02/26/14 5' 10.08" (1.78 m)    Weight: Wt Readings from Last 1 Encounters:  03/10/14 472 lb 0.1 oz (214.1 kg)  214.1 kg s/p HD 4/22 212.1 kg s/p HD 4/17 201.4 kg s/p HD 4/10  BMI:  Body mass index is 67.57 kg/(m^2). Obese Class III  Estimated Nutritional Needs: Kcal: 2300 - 2600 kcal Protein: 130 - 150 g Fluid: 1.2 liters  Skin:  Stage II to mid sacrum Closed R neck  incision Thigh incision Groin incision - wound VAC in place  Diet Order: Renal w/ 1200 ml Fluid Restriction    Intake/Output Summary (Last 24 hours) at 03/11/14 1054 Last data filed at 03/11/14 0658  Gross per 24 hour  Intake    420 ml  Output   2034 ml  Net  -1614 ml    Last BM: 4/22  Labs:   Recent Labs Lab 03/09/14 1045 03/10/14 1250 03/11/14 0544  NA 130* 132* 133*  K 5.3 4.7 4.7  CL 85* 86* 87*  CO2 16* 17* 20  BUN 51* 31* 44*  CREATININE 4.90* 3.34* 4.30*  CALCIUM 9.3 8.8 8.8  PHOS 6.9* 5.1* 5.6*  GLUCOSE 152* 102* 137*    CBG (last 3)   Recent Labs  03/10/14 1948 03/11/14 0057 03/11/14 0733  GLUCAP 142* 165* 139*    Scheduled Meds: . bisacodyl  10 mg Rectal Daily  . docusate sodium  200 mg Oral BID  . feeding supplement (NEPRO CARB STEADY)  237 mL Oral BID BM  . heparin subcutaneous  5,000 Units Subcutaneous 3 times per day  . insulin aspart  0-9 Units Subcutaneous 6 times per day  . midodrine  5 mg Oral TID WC  . multivitamin  1 tablet Oral QHS  . pantoprazole  40 mg Oral Q1200  . polyethylene glycol  17 g Oral Daily  . sodium bicarbonate  1,300 mg Oral TID  . sodium chloride  500 mL Intravenous Once  . sodium chloride  3 mL Intravenous Q12H    Continuous Infusions: . sodium  chloride 20 mL/hr at 2014-03-15 0930    Inda Coke MS, RD, LDN Inpatient Registered Dietitian Pager: [phone] After-hours pager: [phone]

## 2014-03-12 LAB — CBC WITH DIFFERENTIAL/PLATELET
BASOS ABS: 0 10*3/uL (ref 0.0–0.1)
BASOS PCT: 0 % (ref 0–1)
Eosinophils Absolute: 0.1 10*3/uL (ref 0.0–0.7)
Eosinophils Relative: 1 % (ref 0–5)
HEMATOCRIT: 23.4 % — AB (ref 39.0–52.0)
HEMOGLOBIN: 7.9 g/dL — AB (ref 13.0–17.0)
LYMPHS ABS: 1.3 10*3/uL (ref 0.7–4.0)
Lymphocytes Relative: 14 % (ref 12–46)
MCH: 28.3 pg (ref 26.0–34.0)
MCHC: 33.8 g/dL (ref 30.0–36.0)
MCV: 83.9 fL (ref 78.0–100.0)
Monocytes Absolute: 1.2 10*3/uL — ABNORMAL HIGH (ref 0.1–1.0)
Monocytes Relative: 12 % (ref 3–12)
NEUTROS ABS: 7 10*3/uL (ref 1.7–7.7)
Neutrophils Relative %: 73 % (ref 43–77)
Platelets: 94 10*3/uL — ABNORMAL LOW (ref 150–400)
RBC: 2.79 MIL/uL — ABNORMAL LOW (ref 4.22–5.81)
RDW: 18.9 % — ABNORMAL HIGH (ref 11.5–15.5)
WBC: 9.6 10*3/uL (ref 4.0–10.5)

## 2014-03-12 LAB — RENAL FUNCTION PANEL
Albumin: 2.1 g/dL — ABNORMAL LOW (ref 3.5–5.2)
BUN: 6 mg/dL (ref 6–23)
CO2: 27 meq/L (ref 19–32)
Calcium: 7.8 mg/dL — ABNORMAL LOW (ref 8.4–10.5)
Chloride: 92 mEq/L — ABNORMAL LOW (ref 96–112)
Creatinine, Ser: 1.01 mg/dL (ref 0.50–1.35)
GFR calc non Af Amer: 81 mL/min — ABNORMAL LOW (ref >90)
GLUCOSE: 101 mg/dL — AB (ref 70–99)
PHOSPHORUS: 1.1 mg/dL — AB (ref 2.3–4.6)
POTASSIUM: 2.2 meq/L — AB (ref 3.7–5.3)
Sodium: 136 mEq/L — ABNORMAL LOW (ref 137–147)

## 2014-03-12 LAB — PREPARE RBC (CROSSMATCH)

## 2014-03-12 NOTE — Progress Notes (Signed)
Patient ID: Douglas [name]  male  [id number]    DOB: 05/08/1956    DOA: 03/13/2014  PCP: [name], MD    Brief interim history   Patient is a 58 year old male with history of follicular low-grade non-Hodgkin lymphoma, large left groin lymphadenopathy, diagnosed in 2000 and was on recent chemotherapy, had admission for acute renal failure 3/17- 3/20. He was suspected to have acute renal failure de to HCTZ and ARB.    He had outpatient left-sided inguinal node biopsy after which he has developed a large lymph filled seroma on the left groin requiring wound VAC placement and continuous drainage, he continues to drain about liter liter and a half on a daily basis, there were some superinfection which is responding well to antibiotics which are presently on board. Have discussed his case multiple times with general surgery who do not recommend any further surgical intervention. Once he has a rehabilitation bed he could be placed in a rehabilitation as he requires intermittent IV fluid boluses for ongoing fluid loss due to daily lymph drainage from the wound VAC.  He is currently stable from medicine standpoint for discharge to a monitored setting like rehabilitation/SNF, case management and social worker have been informed on 03/10/2014 for the same    SIGNIFICANT EVENTS / STUDIES:   4/3 Hyperkalemia, hyperuricemia, hyperphosphatemia, with ARF requiring emergent CRRT. UA with urate crystals. Renal US normal. No pulmonary edema on CXR. Heparin gtt started due to concern for PE. Started IV vanc for left groin wound.  4/4 ARF thought to be due to ATN from severe sepsis, per oncology not tumor lysis syndrome. No DVT, heparin gtt stopped. 2D-echo normal.  4/5 Continue CRRT, to transition to HD, no renal recovery yet. Hyperuricemia resolved. Dialysis access is a right IJ dialysis catheter 4/6  .Wound vac placed 4/7 -hemodialysis initiated      Assessment/Plan:   Principal  Problem   Acute kidney injury-potentially related to tumor lysis now ESRD :   - Etiology prerenal from volume loss from her fistula versus severe sepsis from left groin infection.  Per Oncology tumor lysis syndrome less likely, Currently undergoing dialysis per renal on a 3 times a week basis. Discussed with renal now likely ESRD.     NHL (non-Hodgkin's lymphoma) with follicular low-grade, large left groin lymph adenopathy status post chemotherapy, hyperuricemia  Hyperuricemia has resolved, oncology following (Dr.Magrinat),  stable G6PD, LDH levels are around 600 which is twice normal, transfuse PRBCs as needed.  CT abdomen for restaging outpatient home along with outpatient followup with oncology post discharge.     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  Since his blood pressures are low, got 1 unit of packed RBC on 03/11/2014 and will get another on 03/12/2014 with dialysis, no signs of ongoing bleed, we'll monitor H&H.     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 Hulen Skains on 02/24/2014, patient to be discharged with home health and wound VAC, will follow with Dr. Barkley Bruns in the office post discharge.    - Noted wound growing gram-negative rods we'll switch to doxycycline + cipro on 02/25/2014 and monitor temperature curve, leukocytosis improving. Clinically responded well will stop antibiotics on 03/10/2014.     Intermittent  tachycardic and hypotension  Likely due to fluid removal during HD, along with ongoing fluid loss from his left inguinal drain , will supplement with IV fluids as needed, have discussed with surgery, they have  no other solution then to continue drainage this time. Per Dr. Barkley Bruns and Hulen Skains   no further surgical intervention is required. Steroids were tapered off. Will place him on midodrine for now and monitor. Stable random cortisol.   Since his blood pressures are low on  03/11/2014 in the setting of anemia and hemoglobin of around 7 he got 1 unit packed RBC transfused on 03/11/2014 and due for another unit on 03/12/2014 with dialysis.      Type 2 diabetes mellitus  - Continue sliding scale insulin  CBG (last 3)   Recent Labs  03/11/14 1151 03/11/14 1647 03/11/14 2301  GLUCAP 144* 151* 128*      GERD with history of GI bleed: Continue PPI      DVT Prophylaxis: Heparin subcutaneous   Code Status: Full CODE STATUS  Family Communication:  Disposition: Rehabilitation, CIR evaluating   He is currently stable from medicine standpoint for discharge to a monitored setting like rehabilitation/SNF, case management and social worker have been informed on 03/10/2014 for the same    Consultants:  Surgery  Nephrology  Oncology    Procedures:  Dialysis  Wound VAC    Antibiotics:  Anti-infectives   Start     Dose/Rate Route Frequency Ordered Stop   02-Apr-2014 0953  ceFAZolin (ANCEF) 1-5 GM-% IVPB    Comments:  [name]   : cabinet override      04-02-2014 9562 2014-04-02 2159   2014-04-02 0953  ceFAZolin (ANCEF) 2-3 GM-% IVPB SOLR    Comments:  [name]   : cabinet override      04-02-2014 0953 04/02/14 2159   04/02/14 0600  [MAR Hold]  ceFAZolin (ANCEF) 3 g in dextrose 5 % 50 mL IVPB     (On MAR Hold since 2014/04/02 0850)   3 g 160 mL/hr over 30 Minutes Intravenous On call 03/07/14 1556 Apr 02, 2014 1021   04/02/14 0000  ceFAZolin (ANCEF) IVPB 1 g/50 mL premix  Status:  Discontinued    Comments:  Send with pt to OR   1 g 100 mL/hr over 30 Minutes Intravenous On call 03/07/14 1540 03/07/14 1555   03/01/14 1800  doxycycline (VIBRA-TABS) tablet 100 mg  Status:  Discontinued     100 mg Oral Every 12 hours 03/01/14 1522 03/10/14 1119   03/01/14 0800  ciprofloxacin (CIPRO) tablet 500 mg  Status:  Discontinued     500 mg Oral Daily with breakfast 02/28/14 1106 03/10/14 1119   02/26/14 0000  doxycycline (VIBRAMYCIN) 100 MG capsule      100 mg Oral 2 times daily 02/26/14 1153     02/26/14 0000  ciprofloxacin (CIPRO) 500 MG tablet     500 mg Oral 2 times daily 02/26/14 1153     02/25/14 1100  ciprofloxacin (CIPRO) IVPB 400 mg  Status:  Discontinued     400 mg 200 mL/hr over 60 Minutes Intravenous Every 12 hours 02/25/14 1047 02/28/14 1106   02/24/14 1830  doxycycline (VIBRAMYCIN) 100 mg in dextrose 5 % 250 mL IVPB  Status:  Discontinued     100 mg 125 mL/hr over 120 Minutes Intravenous Every 12 hours 02/24/14 1756 03/01/14 1521   02/24/14 1800  cefTRIAXone (ROCEPHIN) 1 g in dextrose 5 % 50 mL IVPB  Status:  Discontinued     1 g 100 mL/hr over 30 Minutes Intravenous Every 24 hours 02/24/14 1231 02/24/14 1756   02/24/14 1200  vancomycin (VANCOCIN) 1,500 mg in sodium chloride 0.9 % 250 mL IVPB  Status:  Discontinued     1,500 mg 250 mL/hr over 60 Minutes Intravenous  Once 02/23/14 1423 02/24/14 1231   02/20/14 2200  vancomycin (VANCOCIN) 1,500 mg in sodium chloride 0.9 % 500 mL IVPB  Status:  Discontinued     1,500 mg 250 mL/hr over 120 Minutes Intravenous Every 24 hours 02/20/2014 2119 02/22/14 1116   03/12/2014 2200  vancomycin (VANCOCIN) 2,000 mg in sodium chloride 0.9 % 500 mL IVPB     2,000 mg 250 mL/hr over 120 Minutes Intravenous  Once 03/07/2014 2119 02/20/14 0005     Subjective:  Patient in bed, no fever chills, no headache chest abdominal pain, no shortness of breath, generalized weakness.  Objective: Weight change:   Intake/Output Summary (Last 24 hours) at 03/12/14 1212 Last data filed at 03/11/14 1705  Gross per 24 hour  Intake  607.5 ml  Output      0 ml  Net  607.5 ml   Blood pressure 109/57, pulse 96, temperature 98.7 F (37.1 C), temperature source Oral, resp. rate 20, height 5' 10.08" (1.78 m), weight 214.1 kg (472 lb 0.1 oz), SpO2 96.00%.  Physical Exam: General: Alert and awake, oriented x3, not in any acute distress. CVS: S1-S2 clear, no murmur rubs or gallops Chest: clear to auscultation  bilaterally, no wheezing, rales or rhonchi Abdomen: soft  nondistended, normal bowel sounds  Extremities: no cyanosis, clubbing. 3+ edema noted bilaterally, left groin has a large fluctuant fluid collection with wound VAC drain in place   Lab Results: Basic Metabolic Panel:  Recent Labs Lab 03/10/14 1250 03/11/14 0544  NA 132* 133*  K 4.7 4.7  CL 86* 87*  CO2 17* 20  GLUCOSE 102* 137*  BUN 31* 44*  CREATININE 3.34* 4.30*  CALCIUM 8.8 8.8  PHOS 5.1* 5.6*   Liver Function Tests:  Recent Labs Lab 03/10/14 1250 03/11/14 0544  ALBUMIN 2.1* 1.9*   No results found for this basename: LIPASE, AMYLASE,  in the last 168 hours No results found for this basename: AMMONIA,  in the last 168 hours CBC:  Recent Labs Lab 03/10/14 0500 03/11/14 0544  WBC 11.4* 9.5  NEUTROABS 7.7 6.9  HGB 7.4* 7.0*  HCT 22.7* 21.3*  MCV 85.0 85.5  PLT 105* 93*   Cardiac Enzymes: No results found for this basename: CKTOTAL, CKMB, CKMBINDEX, TROPONINI,  in the last 168 hours BNP: No components found with this basename: POCBNP,  CBG:  Recent Labs Lab 03/11/14 0057 03/11/14 0733 03/11/14 1151 03/11/14 1647 03/11/14 2301  GLUCAP 165* 139* 144* 151* 128*     Micro Results: No results found for this or any previous visit (from the past 240 hour(s)).  Studies/Results: Dg Chest 1 View  02/03/2014   CLINICAL DATA:  Central catheter placement  EXAM: CHEST - 1 VIEW  COMPARISON:  Chest CT December 02, 2013  FINDINGS: Central catheter tip is in the right axillary vein region. There is again noted a 1.7 x 1.3 cm nodular opacity in the right midlung. There is no edema or consolidation. Heart is mildly enlarged with normal pulmonary vascularity. No apparent adenopathy. No pneumothorax. No bone lesions.  IMPRESSION: Central catheter tip in right axillary vein. Nodular opacity right mid lung. No edema or consolidation.   Electronically Signed   By: Lowella Grip M.D.   On: 02/03/2014 07:06   Dg  Chest 2 View  02/16/2014   CLINICAL DATA:  2 week history shortness of breath ; the patient has a history of non-Hodgkin's  lymphoma  EXAM: CHEST  2 VIEW  COMPARISON:  DG CHEST 1 VIEW dated 02/03/2014  FINDINGS: The lungs are adequately inflated. There is an abnormal soft tissue density nodule that projects in the lower 1/3 of the right hemithorax on the frontal view. It measures 1.4 x 1.9 cm in diameter. It may lie anteriorly in the right middle lobe. This is similar in appearance to that demonstrated on the previous study. Elsewhere no definite pulmonary nodules are demonstrated. There is no alveolar infiltrate. The cardiac silhouette is normal in size. The pulmonary vascularity is not engorged. There is mild degenerative change of the thoracic spine with calcification of the anterior longitudinal ligament.  IMPRESSION: 1. There is persistent nodularity likely in the right middle lobe. There is no definite evidence of pneumonia. 2. There is no evidence of CHF. 3. Further evaluation of the thorax with chest CT scanning is recommended. This would allow further evaluation of the mediastinum and hilar regions as well as the pulmonary parenchyma given the patient's symptoms of shortness of breath.   Electronically Signed   By: David  Martinique   On: 02/16/2014 10:56   US Renal  02/18/2014   CLINICAL DATA:  Evaluate for hydronephrosis  EXAM: RENAL/URINARY TRACT ULTRASOUND COMPLETE  COMPARISON:  US RENAL dated 02/03/2014; [place] ART/VEN FLOW ABD PELV DOPPLER dated 11/27/2013; US SCROTUM dated 11/27/2013  FINDINGS: Right Kidney:  Length: 11.5 cm. Echogenicity within normal limits. No mass or hydronephrosis visualized.  Left Kidney:  Length: 12.2 cm. Echogenicity within normal limits. No mass or hydronephrosis visualized.  Bladder:  Urinary bladder is decompressed, patient is status post Foley catheter insertion.  IMPRESSION: Negative renal ultrasound.   Electronically Signed   By: Margaree Mackintosh M.D.   On: 03/11/2014 16:09   US  Renal  02/03/2014   CLINICAL DATA:  Acute kidney injury.  EXAM: RENAL/URINARY TRACT ULTRASOUND COMPLETE  COMPARISON:  CT ABD/PELVIS W CM dated 10/30/2013  FINDINGS: Right Kidney:  Length: 11.6 cm. Echogenicity within normal limits. No mass or hydronephrosis visualized.  Left Kidney:  Length: 11.8 cm. Echogenicity within normal limits. No mass or hydronephrosis visualized.  Bladder:  Appears normal for degree of bladder distention.  IMPRESSION: Unremarkable study.   Electronically Signed   By: Rolm Baptise M.D.   On: 02/03/2014 16:56   Dg Chest Port 1 View  02/23/2014   CLINICAL DATA:  Respiratory distress.  EXAM: PORTABLE CHEST - 1 VIEW  COMPARISON:  DG CHEST 1V PORT dated 02/20/2014; CT CHEST W/CM dated 12/02/2013; DG CHEST 1V PORT dated 03/17/2014; DG CHEST 1V PORT dated 03/01/2014  FINDINGS: Cardiomediastinal silhouette remains stable and normal. Dialysis catheter tips SVC RA junction, unchanged. No focal infiltrates, although right lung nodular densities persist. Negative osseous structures.  IMPRESSION: No active infiltrates.   Electronically Signed   By: Rolla Flatten M.D.   On: 02/23/2014 08:10   Dg Chest Port 1 View  02/20/2014   CLINICAL DATA:  Shortness of breath  EXAM: PORTABLE CHEST - 1 VIEW  COMPARISON:  03/15/2014  FINDINGS: A temporary dialysis catheter is again noted on the right. The cardiac shadow is stable. The lungs are well aerated bilaterally without focal infiltrate or sizable effusion.  IMPRESSION: No acute abnormality noted.   Electronically Signed   By: Inez Catalina M.D.   On: 02/20/2014 08:08   Dg Chest Port 1 View  02/21/2014   CLINICAL DATA:  Status post central line placement  EXAM: PORTABLE CHEST - 1 VIEW  COMPARISON:  03/08/2014  1251 hrs  FINDINGS: The cardiac shadow is stable. A temporary dialysis catheter is now seen in the distal superior vena cava. No pneumothorax is noted. Mild atelectatic changes are noted in the right base. This may be related to the poor inspiratory effort.   IMPRESSION: Status post temporary dialysis catheter placement without pneumothorax.   Electronically Signed   By: Inez Catalina M.D.   On: 02/18/2014 19:31   Dg Chest Portable 1 View  02/20/2014   CLINICAL DATA:  Shortness of breath. Non-Hodgkin's lymphoma. Diabetes and hypertension.  EXAM: PORTABLE CHEST - 1 VIEW  COMPARISON:  DG CHEST 2 VIEW dated 02/16/2014; DG CHEST 1 VIEW dated 02/03/2014; CT CHEST W/CM dated 12/02/2013  FINDINGS: The heart is mildly enlarged. Right middle lobe nodule appears larger, now measuring 2.7 cm. Probable right upper lobe nodule is only partially seen, measuring approximately 1 cm in diameter radiographically. Lesion was larger based on prior CT evaluation. There is slight prominence the left hilar region but this is felt to be stable compared with prior radiographs and CT. No new consolidation or pleural effusion.  IMPRESSION: Persistent right lung nodules. Right middle lobe nodule is larger ring compared prior studies.   Electronically Signed   By: Shon Hale M.D.   On: 02/17/2014 13:08    Medications: Scheduled Meds: . bisacodyl  10 mg Rectal Daily  . docusate sodium  200 mg Oral BID  . feeding supplement (NEPRO CARB STEADY)  237 mL Oral BID BM  . heparin subcutaneous  5,000 Units Subcutaneous 3 times per day  . insulin aspart  0-9 Units Subcutaneous TID WC  . midodrine  5 mg Oral TID WC  . multivitamin  1 tablet Oral QHS  . pantoprazole  40 mg Oral Q1200  . polyethylene glycol  17 g Oral Daily  . sodium bicarbonate  1,300 mg Oral TID  . sodium chloride  3 mL Intravenous Q12H      LOS: 21 days   Thurnell Lose M.D. [facility] 03/12/2014, 12:12 PM Pager: [phone]  If 7PM-7AM, please contact night-coverage [email or web address] Password [email or web address]

## 2014-03-12 NOTE — Progress Notes (Signed)
Utilization review completed.  

## 2014-03-12 NOTE — Progress Notes (Signed)
Patient ID: Douglas [name], male   DOB: 1956/08/08, 58 y.o.   MRN: [id number] S:no new complaints O:BP 109/57  Pulse 96  Temp(Src) 98.7 F (37.1 C) (Oral)  Resp 20  Ht 5' 10.08" (1.78 m)  Wt 214.1 kg (472 lb 0.1 oz)  BMI 67.57 kg/m2  SpO2 96%  Intake/Output Summary (Last 24 hours) at 03/12/14 1152 Last data filed at 03/11/14 1705  Gross per 24 hour  Intake  607.5 ml  Output      0 ml  Net  607.5 ml   Intake/Output: I/O last 3 completed shifts: In: 787.5 [P.O.:420; I.V.:30; Blood:337.5] Out: 50 [Drains:50]  Intake/Output this shift:    Weight change:  SEG:BTDVV AAM in NAd OHY:WVPXT HS Resp:decreased BS at bases GGY:IRSWN, NT Ext:+edema, wound vac left thigh   Recent Labs Lab 03/06/14 0446 03/07/14 0653 04-02-2014 0500 03/09/14 1045 03/10/14 1250 03/11/14 0544  NA 133* 129* 128* 130* 132* 133*  K 4.5 4.8 5.6* 5.3 4.7 4.7  CL 89* 85* 82* 85* 86* 87*  CO2 18* 15* 15* 16* 17* 20  GLUCOSE 136* 111* 88 152* 102* 137*  BUN 43* 56* 69* 51* 31* 44*  CREATININE 4.47* 5.49* 6.39* 4.90* 3.34* 4.30*  ALBUMIN 1.9* 2.0* 1.9* 2.0* 2.1* 1.9*  CALCIUM 8.8 9.3 9.5 9.3 8.8 8.8  PHOS 5.9* 6.6* 8.5* 6.9* 5.1* 5.6*   Liver Function Tests:  Recent Labs Lab 03/09/14 1045 03/10/14 1250 03/11/14 0544  ALBUMIN 2.0* 2.1* 1.9*   No results found for this basename: LIPASE, AMYLASE,  in the last 168 hours No results found for this basename: AMMONIA,  in the last 168 hours CBC:  Recent Labs Lab 03/07/14 0653 Apr 02, 2014 0500 03/09/14 1045 03/10/14 0500 03/11/14 0544  WBC 11.1* 10.3 9.9 11.4* 9.5  NEUTROABS 8.0* 7.6 6.7 7.7 6.9  HGB 7.7* 7.4* 7.5* 7.4* 7.0*  HCT 23.6* 22.8* 23.4* 22.7* 21.3*  MCV 85.8 85.7 85.7 85.0 85.5  PLT 131* 128* 121* 105* 93*   Cardiac Enzymes: No results found for this basename: CKTOTAL, CKMB, CKMBINDEX, TROPONINI,  in the last 168 hours CBG:  Recent Labs Lab 03/11/14 0057 03/11/14 0733 03/11/14 1151 03/11/14 1647 03/11/14 2301  GLUCAP 165*  139* 144* 151* 128*    Iron Studies: No results found for this basename: IRON, TIBC, TRANSFERRIN, FERRITIN,  in the last 72 hours Studies/Results: No results found. . bisacodyl  10 mg Rectal Daily  . docusate sodium  200 mg Oral BID  . feeding supplement (NEPRO CARB STEADY)  237 mL Oral BID BM  . heparin subcutaneous  5,000 Units Subcutaneous 3 times per day  . insulin aspart  0-9 Units Subcutaneous TID WC  . midodrine  5 mg Oral TID WC  . multivitamin  1 tablet Oral QHS  . pantoprazole  40 mg Oral Q1200  . polyethylene glycol  17 g Oral Daily  . sodium bicarbonate  1,300 mg Oral TID  . sodium chloride  3 mL Intravenous Q12H    BMET    Component Value Date/Time   NA 133* 03/11/2014 0544   NA 137 01/19/2014 1101   K 4.7 03/11/2014 0544   K 3.7 01/19/2014 1101   CL 87* 03/11/2014 0544   CO2 20 03/11/2014 0544   CO2 27 01/19/2014 1101   GLUCOSE 137* 03/11/2014 0544   GLUCOSE 204* 01/19/2014 1101   BUN 44* 03/11/2014 0544   BUN 15.8 01/19/2014 1101   CREATININE 4.30* 03/11/2014 0544   CREATININE 0.9 01/19/2014 1101  CALCIUM 8.8 03/11/2014 0544   CALCIUM 9.8 01/19/2014 1101   GFRNONAA 14* 03/11/2014 0544   GFRAA 16* 03/11/2014 0544   CBC    Component Value Date/Time   WBC 9.5 03/11/2014 0544   WBC 7.9 01/19/2014 1101   RBC 2.49* 03/11/2014 0544   RBC 3.02* 01/19/2014 1101   HGB 7.0* 03/11/2014 0544   HGB 8.4* 01/19/2014 1101   HCT 21.3* 03/11/2014 0544   HCT 26.0* 01/19/2014 1101   PLT 93* 03/11/2014 0544   PLT 477* 01/19/2014 1101   MCV 85.5 03/11/2014 0544   MCV 86.1 01/19/2014 1101   MCH 28.1 03/11/2014 0544   MCH 27.9 01/19/2014 1101   MCHC 32.9 03/11/2014 0544   MCHC 32.5 01/19/2014 1101   RDW 19.7* 03/11/2014 0544   RDW 16.2* 01/19/2014 1101   LYMPHSABS 1.1 03/11/2014 0544   LYMPHSABS 0.0* 01/19/2014 1101   MONOABS 1.5* 03/11/2014 0544   MONOABS 0.9 01/19/2014 1101   EOSABS 0.0 03/11/2014 0544   EOSABS 0.0 01/19/2014 1101   BASOSABS 0.0 03/11/2014 0544   BASOSABS 0.0 01/19/2014 1101     Assessment/ Plan:    1. ABLA/Anemia of malignancy and CKD:EPO per Heme/onc  1. Agree with second blood transfusion today with HD  2. Cont to follow H/H 2. Non-Hodgkin's lympohoma s/p Bendamustine and rituximab 02/03/14 1. Plan per Heme/Onc 3. ESRD: has remained HD-dependent and anuric, presumably due to ischemic ATN without recovery.  1. Cont with HD 3 x week. 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 5. Nutrition:cont with suppl 6. 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  8. Vascular access- has RIJ PC but will need AVF/AVG once stabilized. 9. Dispo- currently not a candidate for outpt HD until he is strong enough for transferring his weight and agree with CIR. Will also need permanent access prior to d/c to outpt HD [place]

## 2014-03-12 NOTE — Consult Note (Signed)
WOC wound follow up Wound type: Vac dressing changed to left groin full thickness wound. Remains with strong odor and large amt yellow drainage in cannister.  Wound appearance unchanged since previous dressing change.  Applied one piece of white foam to tunneling area and one piece black foam over top of wound.  Bridged track pad to thigh area away from fold in groin.  Cont suction on at 140mm.  Pt tolerated without discomfort. Plan dressing change Monday. Pt has developed a new wound to left  Inner groin; 1.5X.3X.2cm, 90% yellow, 10% red, mod amt yellow drainage.  Difficult to reduce pressure to site R/T location in skin fold.  Applied Aquacel to absorb drainage and provide antimicrobial benefits. Julien Girt MSN, RN, [place], [place], [place]

## 2014-03-13 LAB — TYPE AND SCREEN
ABO/RH(D): A POS
Antibody Screen: NEGATIVE
UNIT DIVISION: 0
Unit division: 0

## 2014-03-13 LAB — GLUCOSE, CAPILLARY
GLUCOSE-CAPILLARY: 151 mg/dL — AB (ref 70–99)
GLUCOSE-CAPILLARY: 132 mg/dL — AB (ref 70–99)
Glucose-Capillary: 134 mg/dL — ABNORMAL HIGH (ref 70–99)
Glucose-Capillary: 133 mg/dL — ABNORMAL HIGH (ref 70–99)
Glucose-Capillary: 133 mg/dL — ABNORMAL HIGH (ref 70–99)
Glucose-Capillary: 112 mg/dL — ABNORMAL HIGH (ref 70–99)
Glucose-Capillary: 168 mg/dL — ABNORMAL HIGH (ref 70–99)
Glucose-Capillary: 148 mg/dL — ABNORMAL HIGH (ref 70–99)

## 2014-03-13 MED ORDER — SODIUM CHLORIDE 0.9 % IV BOLUS (SEPSIS)
250.0000 mL | Freq: Once | INTRAVENOUS | Status: AC
  Administered 2014-03-13: 250 mL via INTRAVENOUS

## 2014-03-13 NOTE — Progress Notes (Signed)
RN called to let RT Know that patient was requesting to go on his CPAP.  RT placed patient on CPAP and patient is tolerating well at this time.

## 2014-03-13 NOTE — Progress Notes (Signed)
Patient ID: Douglas [name], male   DOB: 28-May-1956, 58 y.o.   MRN: [id number] S:resting comfortably O:BP 92/50  Pulse 92  Temp(Src) 98.1 F (36.7 C) (Oral)  Resp 20  Ht 5' 10.08" (1.78 m)  Wt 214 kg (471 lb 12.6 oz)  BMI 67.54 kg/m2  SpO2 98%  Intake/Output Summary (Last 24 hours) at 03/13/14 1244 Last data filed at 03/13/14 1200  Gross per 24 hour  Intake   1105 ml  Output   2037 ml  Net   -932 ml   Intake/Output: I/O last 3 completed shifts: In: 1540 [P.O.:840; I.V.:50; Blood:335] Out: 2037 [Other:2037]  Intake/Output this shift:  Total I/O In: 120 [P.O.:120] Out: -  Weight change:  Gen:WD obese AAM in NAd CVS:no rub Resp:decreased BS GQQ:PYPPJ Ext:+edema   Recent Labs Lab 03/07/14 0653 03/01/2014 0500 03/09/14 1045 03/10/14 1250 03/11/14 0544 03/12/14 1238  NA 129* 128* 130* 132* 133* 136*  K 4.8 5.6* 5.3 4.7 4.7 2.2*  CL 85* 82* 85* 86* 87* 92*  CO2 15* 15* 16* 17* 20 27  GLUCOSE 111* 88 152* 102* 137* 101*  BUN 56* 69* 51* 31* 44* 6  CREATININE 5.49* 6.39* 4.90* 3.34* 4.30* 1.01  ALBUMIN 2.0* 1.9* 2.0* 2.1* 1.9* 2.1*  CALCIUM 9.3 9.5 9.3 8.8 8.8 7.8*  PHOS 6.6* 8.5* 6.9* 5.1* 5.6* 1.1*   Liver Function Tests:  Recent Labs Lab 03/10/14 1250 03/11/14 0544 03/12/14 1238  ALBUMIN 2.1* 1.9* 2.1*   No results found for this basename: LIPASE, AMYLASE,  in the last 168 hours No results found for this basename: AMMONIA,  in the last 168 hours CBC:  Recent Labs Lab 02/26/2014 0500 03/09/14 1045 03/10/14 0500 03/11/14 0544 03/12/14 1204  WBC 10.3 9.9 11.4* 9.5 9.6  NEUTROABS 7.6 6.7 7.7 6.9 7.0  HGB 7.4* 7.5* 7.4* 7.0* 7.9*  HCT 22.8* 23.4* 22.7* 21.3* 23.4*  MCV 85.7 85.7 85.0 85.5 83.9  PLT 128* 121* 105* 93* 94*   Cardiac Enzymes: No results found for this basename: CKTOTAL, CKMB, CKMBINDEX, TROPONINI,  in the last 168 hours CBG:  Recent Labs Lab 03/11/14 1647 03/11/14 2301 03/12/14 2021 03/13/14 0741 03/13/14 1128  GLUCAP 151*  128* 112* 132* 168*    Iron Studies: No results found for this basename: IRON, TIBC, TRANSFERRIN, FERRITIN,  in the last 72 hours Studies/Results: No results found. . bisacodyl  10 mg Rectal Daily  . docusate sodium  200 mg Oral BID  . feeding supplement (NEPRO CARB STEADY)  237 mL Oral BID BM  . heparin subcutaneous  5,000 Units Subcutaneous 3 times per day  . insulin aspart  0-9 Units Subcutaneous TID WC  . midodrine  5 mg Oral TID WC  . multivitamin  1 tablet Oral QHS  . pantoprazole  40 mg Oral Q1200  . polyethylene glycol  17 g Oral Daily  . sodium bicarbonate  1,300 mg Oral TID  . sodium chloride  250 mL Intravenous Once  . sodium chloride  3 mL Intravenous Q12H    BMET    Component Value Date/Time   NA 136* 03/12/2014 1238   NA 137 01/19/2014 1101   K 2.2* 03/12/2014 1238   K 3.7 01/19/2014 1101   CL 92* 03/12/2014 1238   CO2 27 03/12/2014 1238   CO2 27 01/19/2014 1101   GLUCOSE 101* 03/12/2014 1238   GLUCOSE 204* 01/19/2014 1101   BUN 6 03/12/2014 1238   BUN 15.8 01/19/2014 1101   CREATININE 1.01 03/12/2014 1238  CREATININE 0.9 01/19/2014 1101   CALCIUM 7.8* 03/12/2014 1238   CALCIUM 9.8 01/19/2014 1101   GFRNONAA 81* 03/12/2014 1238   GFRAA >90 03/12/2014 1238   CBC    Component Value Date/Time   WBC 9.6 03/12/2014 1204   WBC 7.9 01/19/2014 1101   RBC 2.79* 03/12/2014 1204   RBC 3.02* 01/19/2014 1101   HGB 7.9* 03/12/2014 1204   HGB 8.4* 01/19/2014 1101   HCT 23.4* 03/12/2014 1204   HCT 26.0* 01/19/2014 1101   PLT 94* 03/12/2014 1204   PLT 477* 01/19/2014 1101   MCV 83.9 03/12/2014 1204   MCV 86.1 01/19/2014 1101   MCH 28.3 03/12/2014 1204   MCH 27.9 01/19/2014 1101   MCHC 33.8 03/12/2014 1204   MCHC 32.5 01/19/2014 1101   RDW 18.9* 03/12/2014 1204   RDW 16.2* 01/19/2014 1101   LYMPHSABS 1.3 03/12/2014 1204   LYMPHSABS 0.0* 01/19/2014 1101   MONOABS 1.2* 03/12/2014 1204   MONOABS 0.9 01/19/2014 1101   EOSABS 0.1 03/12/2014 1204   EOSABS 0.0 01/19/2014 1101   BASOSABS 0.0 03/12/2014 1204   BASOSABS  0.0 01/19/2014 1101     Assessment/ Plan:   1. ABLA/Anemia of malignancy and CKD:EPO per Heme/onc  1. s/p second blood transfusion yesterday with HD  2. Cont to follow H/H 2. Non-Hodgkin's lympohoma s/p Bendamustine and rituximab 02/03/14  1. Plan per Heme/Onc 3. ESRD: has remained HD-dependent and anuric, presumably due to ischemic ATN without recovery.  1. Cont with HD 3 x week. 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 5. Nutrition:cont with suppl 6. 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  8. Vascular access- has RIJ PC but will need AVF/AVG once stabilized. 9. Dispo- currently not a candidate for outpt HD until he is strong enough for transferring his weight and agree with CIR. Will also need permanent access prior to d/c to outpt HD [place]

## 2014-03-13 NOTE — Progress Notes (Signed)
Patient ID: Douglas [name]  male  IDP:[id number]    DOB: 1956-10-30    DOA: 03/16/2014  PCP: [name], MD    Brief interim history   Patient is a 58 year old male with history of follicular low-grade non-Hodgkin lymphoma, large left groin lymphadenopathy, diagnosed in 2000 and was on recent chemotherapy, had admission for acute renal failure 3/17- 3/20. He was suspected to have acute renal failure de to HCTZ and ARB.    He had outpatient left-sided inguinal node biopsy after which he has developed a large lymph filled seroma on the left groin requiring wound VAC placement and continuous drainage, he continues to drain about liter liter and a half on a daily basis, there were some superinfection which is responding well to antibiotics which are presently on board. Have discussed his case multiple times with general surgery who do not recommend any further surgical intervention. Once he has a rehabilitation bed he could be placed in a rehabilitation as he requires intermittent IV fluid boluses for ongoing fluid loss due to daily lymph drainage from the wound VAC.  He is currently stable from medicine standpoint for discharge to a monitored setting like rehabilitation/SNF, case management and social worker have been informed on 03/10/2014 for the same    SIGNIFICANT EVENTS / STUDIES:   4/3 Hyperkalemia, hyperuricemia, hyperphosphatemia, with ARF requiring emergent CRRT. UA with urate crystals. Renal US normal. No pulmonary edema on CXR. Heparin gtt started due to concern for PE. Started IV vanc for left groin wound.  4/4 ARF thought to be due to ATN from severe sepsis, per oncology not tumor lysis syndrome. No DVT, heparin gtt stopped. 2D-echo normal.  4/5 Continue CRRT, to transition to HD, no renal recovery yet. Hyperuricemia resolved. Dialysis access is a right IJ dialysis catheter 4/6  .Wound vac placed 4/7 -hemodialysis initiated      Assessment/Plan:   Principal  Problem   Acute kidney injury-potentially related to tumor lysis now ESRD :   - Etiology prerenal from volume loss from her fistula versus severe sepsis from left groin infection.  Per Oncology tumor lysis syndrome less likely, Currently undergoing dialysis per renal on a 3 times a week basis. Discussed with renal now likely ESRD.     NHL (non-Hodgkin's lymphoma) with follicular low-grade, large left groin lymph adenopathy status post chemotherapy, hyperuricemia  Hyperuricemia has resolved, oncology following (Dr.Magrinat),  stable G6PD, LDH levels are around 600 which is twice normal, transfuse PRBCs as needed.  CT abdomen for restaging outpatient home along with outpatient followup with oncology post discharge.     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  Since his blood pressures are low, got totaled 2 units of packed RBCs one each on 03/11/2014 and 03/12/2014, no signs of ongoing bleed, we'll monitor H&H.     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 Hulen Skains on 02/24/2014, patient to be discharged with home health and wound VAC, will follow with Dr. Barkley Bruns in the office post discharge.    - Noted wound growing gram-negative rods we'll switch to doxycycline + cipro on 02/25/2014 and monitor temperature curve, leukocytosis improving. Clinically responded well will stop antibiotics on 03/10/2014.     Intermittent  tachycardic and hypotension  Likely due to fluid removal during HD, along with ongoing fluid loss from his left inguinal drain , will supplement with IV fluids as needed, have discussed with surgery, they have no other solution  then to continue drainage this time. Per Dr. Barkley Bruns and Hulen Skains   no further surgical intervention is required. Steroids were tapered off. Will place him on midodrine for now and monitor. Stable random cortisol.   Since his blood pressures are low on 03/11/2014 in the  setting of anemia and hemoglobin of around 7 he got 1 unit packed RBC transfused on 03/11/2014 and due for another unit on 03/12/2014 with dialysis.      Type 2 diabetes mellitus  - Continue sliding scale insulin  CBG (last 3)   Recent Labs  03/11/14 2301 03/12/14 2021 03/13/14 0741  GLUCAP 128* 112* 132*       GERD with history of GI bleed: Continue PPI     Note hypokalemia noted on 03/12/2014 on his lab work  Highly doubt this was patient's blood, he is ESRD patient, this likely was lab. We'll recheck BMP tomorrow. He status post dialysis yesterday. This was predialysis sample.       DVT Prophylaxis: Heparin subcutaneous   Code Status: Full CODE STATUS  Family Communication:  Disposition: Rehabilitation, CIR evaluating   He is currently stable from medicine standpoint for discharge to a monitored setting like rehabilitation/SNF, case management and social worker have been informed on 03/10/2014 for the same    Consultants:  Surgery  Nephrology  Oncology    Procedures:  Dialysis  Wound VAC    Antibiotics:  Anti-infectives   Start     Dose/Rate Route Frequency Ordered Stop   02/21/2014 0953  ceFAZolin (ANCEF) 1-5 GM-% IVPB    Comments:  [name]   : cabinet override      02/23/2014 4166 03/15/2014 2159   03/07/2014 0953  ceFAZolin (ANCEF) 2-3 GM-% IVPB SOLR    Comments:  [name]   : cabinet override      03/01/2014 0953 03/01/2014 2159   02/21/2014 0600  [MAR Hold]  ceFAZolin (ANCEF) 3 g in dextrose 5 % 50 mL IVPB     (On MAR Hold since 03/18/2014 0850)   3 g 160 mL/hr over 30 Minutes Intravenous On call 03/07/14 1556 02/25/2014 1021   03/02/2014 0000  ceFAZolin (ANCEF) IVPB 1 g/50 mL premix  Status:  Discontinued    Comments:  Send with pt to OR   1 g 100 mL/hr over 30 Minutes Intravenous On call 03/07/14 1540 03/07/14 1555   03/01/14 1800  doxycycline (VIBRA-TABS) tablet 100 mg  Status:  Discontinued     100 mg Oral Every 12 hours  03/01/14 1522 03/10/14 1119   03/01/14 0800  ciprofloxacin (CIPRO) tablet 500 mg  Status:  Discontinued     500 mg Oral Daily with breakfast 02/28/14 1106 03/10/14 1119   02/26/14 0000  doxycycline (VIBRAMYCIN) 100 MG capsule     100 mg Oral 2 times daily 02/26/14 1153     02/26/14 0000  ciprofloxacin (CIPRO) 500 MG tablet     500 mg Oral 2 times daily 02/26/14 1153     02/25/14 1100  ciprofloxacin (CIPRO) IVPB 400 mg  Status:  Discontinued     400 mg 200 mL/hr over 60 Minutes Intravenous Every 12 hours 02/25/14 1047 02/28/14 1106   02/24/14 1830  doxycycline (VIBRAMYCIN) 100 mg in dextrose 5 % 250 mL IVPB  Status:  Discontinued     100 mg 125 mL/hr over 120 Minutes Intravenous Every 12 hours 02/24/14 1756 03/01/14 1521   02/24/14 1800  cefTRIAXone (ROCEPHIN) 1 g in dextrose 5 % 50 mL IVPB  Status:  Discontinued     1 g 100 mL/hr over 30 Minutes Intravenous Every 24 hours 02/24/14 1231 02/24/14 1756   02/24/14 1200  vancomycin (VANCOCIN) 1,500 mg in sodium chloride 0.9 % 250 mL IVPB  Status:  Discontinued     1,500 mg 250 mL/hr over 60 Minutes Intravenous  Once 02/23/14 1423 02/24/14 1231   02/20/14 2200  vancomycin (VANCOCIN) 1,500 mg in sodium chloride 0.9 % 500 mL IVPB  Status:  Discontinued     1,500 mg 250 mL/hr over 120 Minutes Intravenous Every 24 hours 02/22/2014 2119 02/22/14 1116   02/26/2014 2200  vancomycin (VANCOCIN) 2,000 mg in sodium chloride 0.9 % 500 mL IVPB     2,000 mg 250 mL/hr over 120 Minutes Intravenous  Once 02/23/2014 2119 02/20/14 0005     Subjective:  Patient in bed, no fever chills, no headache chest abdominal pain, no shortness of breath, generalized weakness.  Objective: Weight change:   Intake/Output Summary (Last 24 hours) at 03/13/14 1032 Last data filed at 03/13/14 0700  Gross per 24 hour  Intake    985 ml  Output   2037 ml  Net  -1052 ml   Blood pressure 92/50, pulse 92, temperature 98.1 F (36.7 C), temperature source Oral, resp. rate 20, height  5' 10.08" (1.78 m), weight 214 kg (471 lb 12.6 oz), SpO2 98.00%.  Physical Exam: General: Alert and awake, oriented x3, not in any acute distress. CVS: S1-S2 clear, no murmur rubs or gallops Chest: clear to auscultation bilaterally, no wheezing, rales or rhonchi Abdomen: soft  nondistended, normal bowel sounds  Extremities: no cyanosis, clubbing. 3+ edema noted bilaterally, left groin has a large fluctuant fluid collection with wound VAC drain in place   Lab Results: Basic Metabolic Panel:  Recent Labs Lab 03/11/14 0544 03/12/14 1238  NA 133* 136*  K 4.7 2.2*  CL 87* 92*  CO2 20 27  GLUCOSE 137* 101*  BUN 44* 6  CREATININE 4.30* 1.01  CALCIUM 8.8 7.8*  PHOS 5.6* 1.1*   Liver Function Tests:  Recent Labs Lab 03/11/14 0544 03/12/14 1238  ALBUMIN 1.9* 2.1*   No results found for this basename: LIPASE, AMYLASE,  in the last 168 hours No results found for this basename: AMMONIA,  in the last 168 hours CBC:  Recent Labs Lab 03/11/14 0544 03/12/14 1204  WBC 9.5 9.6  NEUTROABS 6.9 7.0  HGB 7.0* 7.9*  HCT 21.3* 23.4*  MCV 85.5 83.9  PLT 93* 94*   Cardiac Enzymes: No results found for this basename: CKTOTAL, CKMB, CKMBINDEX, TROPONINI,  in the last 168 hours BNP: No components found with this basename: POCBNP,  CBG:  Recent Labs Lab 03/11/14 1151 03/11/14 1647 03/11/14 2301 03/12/14 2021 03/13/14 0741  GLUCAP 144* 151* 128* 112* 132*     Micro Results: No results found for this or any previous visit (from the past 240 hour(s)).  Studies/Results: Dg Chest 1 View  02/03/2014   CLINICAL DATA:  Central catheter placement  EXAM: CHEST - 1 VIEW  COMPARISON:  Chest CT December 02, 2013  FINDINGS: Central catheter tip is in the right axillary vein region. There is again noted a 1.7 x 1.3 cm nodular opacity in the right midlung. There is no edema or consolidation. Heart is mildly enlarged with normal pulmonary vascularity. No apparent adenopathy. No pneumothorax.  No bone lesions.  IMPRESSION: Central catheter tip in right axillary vein. Nodular opacity right mid lung. No edema or consolidation.   Electronically Signed  By: Lowella Grip M.D.   On: 02/03/2014 07:06   Dg Chest 2 View  02/16/2014   CLINICAL DATA:  2 week history shortness of breath ; the patient has a history of non-Hodgkin's lymphoma  EXAM: CHEST  2 VIEW  COMPARISON:  DG CHEST 1 VIEW dated 02/03/2014  FINDINGS: The lungs are adequately inflated. There is an abnormal soft tissue density nodule that projects in the lower 1/3 of the right hemithorax on the frontal view. It measures 1.4 x 1.9 cm in diameter. It may lie anteriorly in the right middle lobe. This is similar in appearance to that demonstrated on the previous study. Elsewhere no definite pulmonary nodules are demonstrated. There is no alveolar infiltrate. The cardiac silhouette is normal in size. The pulmonary vascularity is not engorged. There is mild degenerative change of the thoracic spine with calcification of the anterior longitudinal ligament.  IMPRESSION: 1. There is persistent nodularity likely in the right middle lobe. There is no definite evidence of pneumonia. 2. There is no evidence of CHF. 3. Further evaluation of the thorax with chest CT scanning is recommended. This would allow further evaluation of the mediastinum and hilar regions as well as the pulmonary parenchyma given the patient's symptoms of shortness of breath.   Electronically Signed   By: David  Martinique   On: 02/16/2014 10:56   US Renal  02/27/2014   CLINICAL DATA:  Evaluate for hydronephrosis  EXAM: RENAL/URINARY TRACT ULTRASOUND COMPLETE  COMPARISON:  US RENAL dated 02/03/2014; [place] ART/VEN FLOW ABD PELV DOPPLER dated 11/27/2013; US SCROTUM dated 11/27/2013  FINDINGS: Right Kidney:  Length: 11.5 cm. Echogenicity within normal limits. No mass or hydronephrosis visualized.  Left Kidney:  Length: 12.2 cm. Echogenicity within normal limits. No mass or hydronephrosis  visualized.  Bladder:  Urinary bladder is decompressed, patient is status post Foley catheter insertion.  IMPRESSION: Negative renal ultrasound.   Electronically Signed   By: Margaree Mackintosh M.D.   On: 03/18/2014 16:09   US Renal  02/03/2014   CLINICAL DATA:  Acute kidney injury.  EXAM: RENAL/URINARY TRACT ULTRASOUND COMPLETE  COMPARISON:  CT ABD/PELVIS W CM dated 10/30/2013  FINDINGS: Right Kidney:  Length: 11.6 cm. Echogenicity within normal limits. No mass or hydronephrosis visualized.  Left Kidney:  Length: 11.8 cm. Echogenicity within normal limits. No mass or hydronephrosis visualized.  Bladder:  Appears normal for degree of bladder distention.  IMPRESSION: Unremarkable study.   Electronically Signed   By: Rolm Baptise M.D.   On: 02/03/2014 16:56   Dg Chest Port 1 View  02/23/2014   CLINICAL DATA:  Respiratory distress.  EXAM: PORTABLE CHEST - 1 VIEW  COMPARISON:  DG CHEST 1V PORT dated 02/20/2014; CT CHEST W/CM dated 12/02/2013; DG CHEST 1V PORT dated 02/17/2014; DG CHEST 1V PORT dated 03/08/2014  FINDINGS: Cardiomediastinal silhouette remains stable and normal. Dialysis catheter tips SVC RA junction, unchanged. No focal infiltrates, although right lung nodular densities persist. Negative osseous structures.  IMPRESSION: No active infiltrates.   Electronically Signed   By: Rolla Flatten M.D.   On: 02/23/2014 08:10   Dg Chest Port 1 View  02/20/2014   CLINICAL DATA:  Shortness of breath  EXAM: PORTABLE CHEST - 1 VIEW  COMPARISON:  02/24/2014  FINDINGS: A temporary dialysis catheter is again noted on the right. The cardiac shadow is stable. The lungs are well aerated bilaterally without focal infiltrate or sizable effusion.  IMPRESSION: No acute abnormality noted.   Electronically Signed   By: Elta Guadel[place]  Lukens M.D.   On: 02/20/2014 08:08   Dg Chest Port 1 View  02/18/2014   CLINICAL DATA:  Status post central line placement  EXAM: PORTABLE CHEST - 1 VIEW  COMPARISON:  03/13/2014 1251 hrs  FINDINGS: The cardiac  shadow is stable. A temporary dialysis catheter is now seen in the distal superior vena cava. No pneumothorax is noted. Mild atelectatic changes are noted in the right base. This may be related to the poor inspiratory effort.  IMPRESSION: Status post temporary dialysis catheter placement without pneumothorax.   Electronically Signed   By: Inez Catalina M.D.   On: 02/23/2014 19:31   Dg Chest Portable 1 View  03/09/2014   CLINICAL DATA:  Shortness of breath. Non-Hodgkin's lymphoma. Diabetes and hypertension.  EXAM: PORTABLE CHEST - 1 VIEW  COMPARISON:  DG CHEST 2 VIEW dated 02/16/2014; DG CHEST 1 VIEW dated 02/03/2014; CT CHEST W/CM dated 12/02/2013  FINDINGS: The heart is mildly enlarged. Right middle lobe nodule appears larger, now measuring 2.7 cm. Probable right upper lobe nodule is only partially seen, measuring approximately 1 cm in diameter radiographically. Lesion was larger based on prior CT evaluation. There is slight prominence the left hilar region but this is felt to be stable compared with prior radiographs and CT. No new consolidation or pleural effusion.  IMPRESSION: Persistent right lung nodules. Right middle lobe nodule is larger ring compared prior studies.   Electronically Signed   By: Shon Hale M.D.   On: 03/09/2014 13:08    Medications: Scheduled Meds: . bisacodyl  10 mg Rectal Daily  . docusate sodium  200 mg Oral BID  . feeding supplement (NEPRO CARB STEADY)  237 mL Oral BID BM  . heparin subcutaneous  5,000 Units Subcutaneous 3 times per day  . insulin aspart  0-9 Units Subcutaneous TID WC  . midodrine  5 mg Oral TID WC  . multivitamin  1 tablet Oral QHS  . pantoprazole  40 mg Oral Q1200  . polyethylene glycol  17 g Oral Daily  . sodium bicarbonate  1,300 mg Oral TID  . sodium chloride  3 mL Intravenous Q12H      LOS: 22 days   Thurnell Lose M.D. [facility] 03/13/2014, 10:32 AM Pager: [phone]  If 7PM-7AM, please contact  night-coverage [email or web address] Password [email or web address]

## 2014-03-13 NOTE — Progress Notes (Signed)
Pt placing self on/off cpap. 

## 2014-03-13 NOTE — Progress Notes (Signed)
Patient does not want to go on CPAP at this time.  PT stated that he would call RN when he was ready to put CPAP on.

## 2014-03-14 LAB — CBC WITH DIFFERENTIAL/PLATELET
BASOS ABS: 0 10*3/uL (ref 0.0–0.1)
Basophils Relative: 0 % (ref 0–1)
EOS ABS: 0.1 10*3/uL (ref 0.0–0.7)
Eosinophils Relative: 1 % (ref 0–5)
HCT: 27 % — ABNORMAL LOW (ref 39.0–52.0)
Hemoglobin: 8.8 g/dL — ABNORMAL LOW (ref 13.0–17.0)
LYMPHS ABS: 0.9 10*3/uL (ref 0.7–4.0)
Lymphocytes Relative: 11 % — ABNORMAL LOW (ref 12–46)
MCH: 28.7 pg (ref 26.0–34.0)
MCHC: 32.6 g/dL (ref 30.0–36.0)
MCV: 87.9 fL (ref 78.0–100.0)
MONO ABS: 1.4 10*3/uL — AB (ref 0.1–1.0)
MONOS PCT: 18 % — AB (ref 3–12)
NEUTROS ABS: 5.6 10*3/uL (ref 1.7–7.7)
Neutrophils Relative %: 70 % (ref 43–77)
PLATELETS: 87 10*3/uL — AB (ref 150–400)
RBC: 3.07 MIL/uL — ABNORMAL LOW (ref 4.22–5.81)
RDW: 19.8 % — AB (ref 11.5–15.5)
WBC: 8 10*3/uL (ref 4.0–10.5)

## 2014-03-14 LAB — RENAL FUNCTION PANEL
ALBUMIN: 1.7 g/dL — AB (ref 3.5–5.2)
BUN: 48 mg/dL — AB (ref 6–23)
CALCIUM: 9 mg/dL (ref 8.4–10.5)
CO2: 22 mEq/L (ref 19–32)
Chloride: 88 mEq/L — ABNORMAL LOW (ref 96–112)
Creatinine, Ser: 4.78 mg/dL — ABNORMAL HIGH (ref 0.50–1.35)
GFR calc Af Amer: 14 mL/min — ABNORMAL LOW (ref >90)
GFR calc non Af Amer: 12 mL/min — ABNORMAL LOW (ref >90)
GLUCOSE: 129 mg/dL — AB (ref 70–99)
POTASSIUM: 5.3 meq/L (ref 3.7–5.3)
Phosphorus: 6 mg/dL — ABNORMAL HIGH (ref 2.3–4.6)
Sodium: 132 mEq/L — ABNORMAL LOW (ref 137–147)

## 2014-03-14 LAB — GLUCOSE, CAPILLARY
GLUCOSE-CAPILLARY: 144 mg/dL — AB (ref 70–99)
GLUCOSE-CAPILLARY: 138 mg/dL — AB (ref 70–99)
Glucose-Capillary: 136 mg/dL — ABNORMAL HIGH (ref 70–99)
Glucose-Capillary: 141 mg/dL — ABNORMAL HIGH (ref 70–99)
Glucose-Capillary: 121 mg/dL — ABNORMAL HIGH (ref 70–99)

## 2014-03-14 NOTE — Progress Notes (Signed)
Patient ID: Douglas [name]  male  UYQ:[id number]    DOB: [birth year]    DOA: 03/14/2014  PCP: [name], MD    Brief interim history   Patient is a 58 year old male with history of follicular low-grade non-Hodgkin lymphoma, large left groin lymphadenopathy, diagnosed in 2000 and was on recent chemotherapy, had admission for acute renal failure 3/17- 3/20. He was suspected to have acute renal failure de to HCTZ and ARB.    He had outpatient left-sided inguinal node biopsy after which he has developed a large lymph filled seroma on the left groin requiring wound VAC placement and continuous drainage, he continues to drain about liter liter and a half on a daily basis, there were some superinfection which is responding well to antibiotics which are presently on board. Have discussed his case multiple times with general surgery who do not recommend any further surgical intervention. Once he has a rehabilitation bed he could be placed in a rehabilitation as he requires intermittent IV fluid boluses for ongoing fluid loss due to daily lymph drainage from the wound VAC.  He is currently stable from medicine standpoint for discharge to a monitored setting like rehabilitation/SNF, case management and social worker have been informed on 03/10/2014 for the same. His long-term prognosis although appears not to good due to underlying lymphoma, ESRD, anasarca and baseline low blood pressures.    SIGNIFICANT EVENTS / STUDIES:   4/3 Hyperkalemia, hyperuricemia, hyperphosphatemia, with ARF requiring emergent CRRT. UA with urate crystals. Renal US normal. No pulmonary edema on CXR. Heparin gtt started due to concern for PE. Started IV vanc for left groin wound.  4/4 ARF thought to be due to ATN from severe sepsis, per oncology not tumor lysis syndrome. No DVT, heparin gtt stopped. 2D-echo normal.  4/5 Continue CRRT, to transition to HD, no renal recovery yet. Hyperuricemia resolved. Dialysis access is a  right IJ dialysis catheter 4/6  .Wound vac placed 4/7 -hemodialysis initiated      Assessment/Plan:   Principal Problem   Acute kidney injury-potentially related to tumor lysis now ESRD :   - Etiology prerenal from volume loss from her fistula versus severe sepsis from left groin infection.  Per Oncology tumor lysis syndrome less likely, Currently undergoing dialysis per renal on a 3 times a week basis. Discussed with renal now likely ESRD.     NHL (non-Hodgkin's lymphoma) with follicular low-grade, large left groin lymph adenopathy status post chemotherapy, hyperuricemia  Hyperuricemia has resolved, oncology following (Dr.Magrinat),  stable G6PD, LDH levels are around 600 which is twice normal, transfuse PRBCs as needed.  CT abdomen for restaging outpatient home along with outpatient followup with oncology post discharge.     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  Since his blood pressures are low, got totaled 2 units of packed RBCs one each on 03/11/2014 and 03/12/2014, no signs of ongoing bleed, we'll monitor H&H.     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 Hulen Skains on 02/24/2014, patient to be discharged with home health and wound VAC, will follow with Dr. Barkley Bruns in the office post discharge.    - Noted wound growing gram-negative rods was placed on doxycycline + cipro on 02/25/2014 stopped on 03/10/14, monitor temperature curve, leukocytosis improving.      Intermittent  tachycardic and hypotension  Likely due to fluid removal during HD, along with ongoing fluid loss from his left inguinal drain , will supplement  with IV fluids as needed, have discussed with surgery, they have no other solution then to continue drainage this time. Per Dr. Barkley Bruns and Hulen Skains   no further surgical intervention is required. Steroids were tapered off. Will place him on midodrine for now and monitor. Stable  random cortisol.   Since his blood pressures are low on 03/11/2014 in the setting of anemia and hemoglobin of around 7,  he got 1 unit packed RBC transfused on 03/11/2014 and due for another unit on 03/12/2014 with dialysis. Post  transfusion  H&H is stable.      Type 2 diabetes mellitus  - Continue sliding scale insulin  CBG (last 3)   Recent Labs  03/13/14 1541 03/13/14 2051 03/14/14 0748  GLUCAP 148* 136* 144*       GERD with history of GI bleed: Continue PPI     Note hypokalemia noted on 03/12/2014 on his lab work  Highly doubt this was patient's blood, he is ESRD patient, this likely was lab. We'll recheck BMP tomorrow. He status post dialysis yesterday. This was predialysis sample.       DVT Prophylaxis: Heparin subcutaneous   Code Status: Full CODE STATUS  Family Communication:  Disposition: Rehabilitation, CIR evaluating   He is currently stable from medicine standpoint for discharge to a monitored setting like rehabilitation/SNF, case management and social worker have been informed on 03/10/2014 for the same    Consultants:  Surgery  Nephrology  Oncology    Procedures:  Dialysis  Wound VAC    Antibiotics:  Anti-infectives   Start     Dose/Rate Route Frequency Ordered Stop   03/15/2014 0953  ceFAZolin (ANCEF) 1-5 GM-% IVPB    Comments:  [name]   : cabinet override      03/04/2014 6295 02/23/2014 2159   02/23/2014 0953  ceFAZolin (ANCEF) 2-3 GM-% IVPB SOLR    Comments:  [name]   : cabinet override      03/07/2014 0953 03/01/2014 2159   02/23/2014 0600  [MAR Hold]  ceFAZolin (ANCEF) 3 g in dextrose 5 % 50 mL IVPB     (On MAR Hold since 03/15/2014 0850)   3 g 160 mL/hr over 30 Minutes Intravenous On call 03/07/14 1556 03/16/2014 1021   02/28/2014 0000  ceFAZolin (ANCEF) IVPB 1 g/50 mL premix  Status:  Discontinued    Comments:  Send with pt to OR   1 g 100 mL/hr over 30 Minutes Intravenous On call 03/07/14 1540 03/07/14 1555    03/01/14 1800  doxycycline (VIBRA-TABS) tablet 100 mg  Status:  Discontinued     100 mg Oral Every 12 hours 03/01/14 1522 03/10/14 1119   03/01/14 0800  ciprofloxacin (CIPRO) tablet 500 mg  Status:  Discontinued     500 mg Oral Daily with breakfast 02/28/14 1106 03/10/14 1119   02/26/14 0000  doxycycline (VIBRAMYCIN) 100 MG capsule     100 mg Oral 2 times daily 02/26/14 1153     02/26/14 0000  ciprofloxacin (CIPRO) 500 MG tablet     500 mg Oral 2 times daily 02/26/14 1153     02/25/14 1100  ciprofloxacin (CIPRO) IVPB 400 mg  Status:  Discontinued     400 mg 200 mL/hr over 60 Minutes Intravenous Every 12 hours 02/25/14 1047 02/28/14 1106   02/24/14 1830  doxycycline (VIBRAMYCIN) 100 mg in dextrose 5 % 250 mL IVPB  Status:  Discontinued     100 mg 125 mL/hr over 120 Minutes Intravenous Every 12  hours 02/24/14 1756 03/01/14 1521   02/24/14 1800  cefTRIAXone (ROCEPHIN) 1 g in dextrose 5 % 50 mL IVPB  Status:  Discontinued     1 g 100 mL/hr over 30 Minutes Intravenous Every 24 hours 02/24/14 1231 02/24/14 1756   02/24/14 1200  vancomycin (VANCOCIN) 1,500 mg in sodium chloride 0.9 % 250 mL IVPB  Status:  Discontinued     1,500 mg 250 mL/hr over 60 Minutes Intravenous  Once 02/23/14 1423 02/24/14 1231   02/20/14 2200  vancomycin (VANCOCIN) 1,500 mg in sodium chloride 0.9 % 500 mL IVPB  Status:  Discontinued     1,500 mg 250 mL/hr over 120 Minutes Intravenous Every 24 hours 03/10/2014 2119 02/22/14 1116   03/03/2014 2200  vancomycin (VANCOCIN) 2,000 mg in sodium chloride 0.9 % 500 mL IVPB     2,000 mg 250 mL/hr over 120 Minutes Intravenous  Once 02/17/2014 2119 02/20/14 0005     Subjective:  Patient in bed, no fever chills, no headache chest abdominal pain, no shortness of breath, generalized weakness.  Objective: Weight change: 3 kg (6 lb 9.8 oz)  Intake/Output Summary (Last 24 hours) at 03/14/14 1044 Last data filed at 03/14/14 0900  Gross per 24 hour  Intake    960 ml  Output    200 ml    Net    760 ml   Blood pressure 92/54, pulse 103, temperature 98.9 F (37.2 C), temperature source Oral, resp. rate 20, height 5' 10.08" (1.78 m), weight 219 kg (482 lb 12.9 oz), SpO2 95.00%.  Physical Exam: General: Alert and awake, oriented x3, not in any acute distress. CVS: S1-S2 clear, no murmur rubs or gallops Chest: clear to auscultation bilaterally, no wheezing, rales or rhonchi Abdomen: soft  nondistended, normal bowel sounds  Extremities: no cyanosis, clubbing. 3+ edema noted bilaterally, left groin has a large fluctuant fluid collection with wound VAC drain in place   Lab Results: Basic Metabolic Panel:  Recent Labs Lab 03/12/14 1238 03/14/14 0540  NA 136* 132*  K 2.2* 5.3  CL 92* 88*  CO2 27 22  GLUCOSE 101* 129*  BUN 6 48*  CREATININE 1.01 4.78*  CALCIUM 7.8* 9.0  PHOS 1.1* 6.0*   Liver Function Tests:  Recent Labs Lab 03/12/14 1238 03/14/14 0540  ALBUMIN 2.1* 1.7*   No results found for this basename: LIPASE, AMYLASE,  in the last 168 hours No results found for this basename: AMMONIA,  in the last 168 hours CBC:  Recent Labs Lab 03/12/14 1204 03/14/14 0540  WBC 9.6 8.0  NEUTROABS 7.0 5.6  HGB 7.9* 8.8*  HCT 23.4* 27.0*  MCV 83.9 87.9  PLT 94* 87*   Cardiac Enzymes: No results found for this basename: CKTOTAL, CKMB, CKMBINDEX, TROPONINI,  in the last 168 hours BNP: No components found with this basename: POCBNP,  CBG:  Recent Labs Lab 03/13/14 0741 03/13/14 1128 03/13/14 1541 03/13/14 2051 03/14/14 0748  GLUCAP 132* 168* 148* 136* 144*     Micro Results: No results found for this or any previous visit (from the past 240 hour(s)).  Studies/Results: Dg Chest 1 View  02/03/2014   CLINICAL DATA:  Central catheter placement  EXAM: CHEST - 1 VIEW  COMPARISON:  Chest CT December 02, 2013  FINDINGS: Central catheter tip is in the right axillary vein region. There is again noted a 1.7 x 1.3 cm nodular opacity in the right midlung. There  is no edema or consolidation. Heart is mildly enlarged with normal pulmonary  vascularity. No apparent adenopathy. No pneumothorax. No bone lesions.  IMPRESSION: Central catheter tip in right axillary vein. Nodular opacity right mid lung. No edema or consolidation.   Electronically Signed   By: Lowella Grip M.D.   On: 02/03/2014 07:06   Dg Chest 2 View  02/16/2014   CLINICAL DATA:  2 week history shortness of breath ; the patient has a history of non-Hodgkin's lymphoma  EXAM: CHEST  2 VIEW  COMPARISON:  DG CHEST 1 VIEW dated 02/03/2014  FINDINGS: The lungs are adequately inflated. There is an abnormal soft tissue density nodule that projects in the lower 1/3 of the right hemithorax on the frontal view. It measures 1.4 x 1.9 cm in diameter. It may lie anteriorly in the right middle lobe. This is similar in appearance to that demonstrated on the previous study. Elsewhere no definite pulmonary nodules are demonstrated. There is no alveolar infiltrate. The cardiac silhouette is normal in size. The pulmonary vascularity is not engorged. There is mild degenerative change of the thoracic spine with calcification of the anterior longitudinal ligament.  IMPRESSION: 1. There is persistent nodularity likely in the right middle lobe. There is no definite evidence of pneumonia. 2. There is no evidence of CHF. 3. Further evaluation of the thorax with chest CT scanning is recommended. This would allow further evaluation of the mediastinum and hilar regions as well as the pulmonary parenchyma given the patient's symptoms of shortness of breath.   Electronically Signed   By: David  Martinique   On: 02/16/2014 10:56   US Renal  03/05/2014   CLINICAL DATA:  Evaluate for hydronephrosis  EXAM: RENAL/URINARY TRACT ULTRASOUND COMPLETE  COMPARISON:  US RENAL dated 02/03/2014; [place] ART/VEN FLOW ABD PELV DOPPLER dated 11/27/2013; US SCROTUM dated 11/27/2013  FINDINGS: Right Kidney:  Length: 11.5 cm. Echogenicity within normal limits. No mass or  hydronephrosis visualized.  Left Kidney:  Length: 12.2 cm. Echogenicity within normal limits. No mass or hydronephrosis visualized.  Bladder:  Urinary bladder is decompressed, patient is status post Foley catheter insertion.  IMPRESSION: Negative renal ultrasound.   Electronically Signed   By: Margaree Mackintosh M.D.   On: 02/21/2014 16:09   US Renal  02/03/2014   CLINICAL DATA:  Acute kidney injury.  EXAM: RENAL/URINARY TRACT ULTRASOUND COMPLETE  COMPARISON:  CT ABD/PELVIS W CM dated 10/30/2013  FINDINGS: Right Kidney:  Length: 11.6 cm. Echogenicity within normal limits. No mass or hydronephrosis visualized.  Left Kidney:  Length: 11.8 cm. Echogenicity within normal limits. No mass or hydronephrosis visualized.  Bladder:  Appears normal for degree of bladder distention.  IMPRESSION: Unremarkable study.   Electronically Signed   By: Rolm Baptise M.D.   On: 02/03/2014 16:56   Dg Chest Port 1 View  02/23/2014   CLINICAL DATA:  Respiratory distress.  EXAM: PORTABLE CHEST - 1 VIEW  COMPARISON:  DG CHEST 1V PORT dated 02/20/2014; CT CHEST W/CM dated 12/02/2013; DG CHEST 1V PORT dated 03/14/2014; DG CHEST 1V PORT dated 03/15/2014  FINDINGS: Cardiomediastinal silhouette remains stable and normal. Dialysis catheter tips SVC RA junction, unchanged. No focal infiltrates, although right lung nodular densities persist. Negative osseous structures.  IMPRESSION: No active infiltrates.   Electronically Signed   By: Rolla Flatten M.D.   On: 02/23/2014 08:10   Dg Chest Port 1 View  02/20/2014   CLINICAL DATA:  Shortness of breath  EXAM: PORTABLE CHEST - 1 VIEW  COMPARISON:  03/04/2014  FINDINGS: A temporary dialysis catheter is again noted on the  right. The cardiac shadow is stable. The lungs are well aerated bilaterally without focal infiltrate or sizable effusion.  IMPRESSION: No acute abnormality noted.   Electronically Signed   By: Inez Catalina M.D.   On: 02/20/2014 08:08   Dg Chest Port 1 View  02-23-2014   CLINICAL DATA:  Status  post central line placement  EXAM: PORTABLE CHEST - 1 VIEW  COMPARISON:  2014/02/23 1251 hrs  FINDINGS: The cardiac shadow is stable. A temporary dialysis catheter is now seen in the distal superior vena cava. No pneumothorax is noted. Mild atelectatic changes are noted in the right base. This may be related to the poor inspiratory effort.  IMPRESSION: Status post temporary dialysis catheter placement without pneumothorax.   Electronically Signed   By: Inez Catalina M.D.   On: 2014/02/23 19:31   Dg Chest Portable 1 View  Feb 23, 2014   CLINICAL DATA:  Shortness of breath. Non-Hodgkin's lymphoma. Diabetes and hypertension.  EXAM: PORTABLE CHEST - 1 VIEW  COMPARISON:  DG CHEST 2 VIEW dated 02/16/2014; DG CHEST 1 VIEW dated 02/03/2014; CT CHEST W/CM dated 12/02/2013  FINDINGS: The heart is mildly enlarged. Right middle lobe nodule appears larger, now measuring 2.7 cm. Probable right upper lobe nodule is only partially seen, measuring approximately 1 cm in diameter radiographically. Lesion was larger based on prior CT evaluation. There is slight prominence the left hilar region but this is felt to be stable compared with prior radiographs and CT. No new consolidation or pleural effusion.  IMPRESSION: Persistent right lung nodules. Right middle lobe nodule is larger ring compared prior studies.   Electronically Signed   By: Shon Hale M.D.   On: 2014-02-23 13:08    Medications: Scheduled Meds: . bisacodyl  10 mg Rectal Daily  . docusate sodium  200 mg Oral BID  . feeding supplement (NEPRO CARB STEADY)  237 mL Oral BID BM  . heparin subcutaneous  5,000 Units Subcutaneous 3 times per day  . insulin aspart  0-9 Units Subcutaneous TID WC  . midodrine  5 mg Oral TID WC  . multivitamin  1 tablet Oral QHS  . pantoprazole  40 mg Oral Q1200  . polyethylene glycol  17 g Oral Daily  . sodium bicarbonate  1,300 mg Oral TID  . sodium chloride  3 mL Intravenous Q12H      LOS: 23 days   Thurnell Lose M.D. [facility] 03/14/2014, 10:44 AM Pager: [phone]  If 7PM-7AM, please contact night-coverage [email or web address] Password [email or web address]

## 2014-03-14 NOTE — Progress Notes (Signed)
Patient ID: Aws Shere, male   DOB: 1956-09-27, 58 y.o.   MRN: [id number]  [facility] Progress Note    Subjective:   No new complaints   Objective:   BP 92/54  Pulse 103  Temp(Src) 98.9 F (37.2 C) (Oral)  Resp 20  Ht 5' 10.08" (1.78 m)  Wt 219 kg (482 lb 12.9 oz)  BMI 69.12 kg/m2  SpO2 95%  Intake/Output: I/O last 3 completed shifts: In: 1200 [P.O.:1200] Out: -    Intake/Output this shift:  Total I/O In: 480 [P.O.:480] Out: 200 [Urine:200] Weight change: 3 kg (6 lb 9.8 oz)  Physical Exam: DXI:PJASNKNL obese AAM lying in bed in NAd CVS:no rub Resp:decreased BS at bases ZJQ:BHALP, +BS Ext:+brawny edema, wound vac on left thigh  Labs: BMET  Recent Labs Lab 2014-03-18 0500 03/09/14 1045 03/10/14 1250 03/11/14 0544 03/12/14 1238 03/14/14 0540  NA 128* 130* 132* 133* 136* 132*  K 5.6* 5.3 4.7 4.7 2.2* 5.3  CL 82* 85* 86* 87* 92* 88*  CO2 15* 16* 17* 20 27 22   GLUCOSE 88 152* 102* 137* 101* 129*  BUN 69* 51* 31* 44* 6 48*  CREATININE 6.39* 4.90* 3.34* 4.30* 1.01 4.78*  ALBUMIN 1.9* 2.0* 2.1* 1.9* 2.1* 1.7*  CALCIUM 9.5 9.3 8.8 8.8 7.8* 9.0  PHOS 8.5* 6.9* 5.1* 5.6* 1.1* 6.0*   CBC  Recent Labs Lab 03/10/14 0500 03/11/14 0544 03/12/14 1204 03/14/14 0540  WBC 11.4* 9.5 9.6 8.0  NEUTROABS 7.7 6.9 7.0 5.6  HGB 7.4* 7.0* 7.9* 8.8*  HCT 22.7* 21.3* 23.4* 27.0*  MCV 85.0 85.5 83.9 87.9  PLT 105* 93* 94* 87*    @IMGRELPRIORS @ Medications:    . bisacodyl  10 mg Rectal Daily  . docusate sodium  200 mg Oral BID  . feeding supplement (NEPRO CARB STEADY)  237 mL Oral BID BM  . heparin subcutaneous  5,000 Units Subcutaneous 3 times per day  . insulin aspart  0-9 Units Subcutaneous TID WC  . midodrine  5 mg Oral TID WC  . multivitamin  1 tablet Oral QHS  . pantoprazole  40 mg Oral Q1200  . polyethylene glycol  17 g Oral Daily  . sodium bicarbonate  1,300 mg Oral TID  . sodium chloride  3 mL Intravenous Q12H     Assessment/  Plan:   1. ABLA/Anemia of malignancy and CKD:EPO per Heme/onc  1. s/p second blood transfusion yesterday with HD  2. Cont to follow H/H 2. Non-Hodgkin's lympohoma s/p Bendamustine and rituximab 02/03/14  1. Plan per Heme/Onc 3. ESRD: has remained HD-dependent and anuric, presumably due to ischemic ATN without recovery.  1. Cont with HD 3 x week. 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 5. Nutrition:cont with suppl 6. 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  8. Vascular access- has RIJ PC but will need AVF/AVG once stabilized. 9. Dispo- currently not a candidate for outpt HD until he is strong enough for transferring his weight and agree with CIR. Will also need permanent access prior to d/c to outpt HD [place] 03/14/2014, 10:25 AM

## 2014-03-14 NOTE — Progress Notes (Signed)
Pt places self on/off cpap/ 

## 2014-03-15 LAB — CBC WITH DIFFERENTIAL/PLATELET
HCT: 30 % — ABNORMAL LOW (ref 39.0–52.0)
Hemoglobin: 9.7 g/dL — ABNORMAL LOW (ref 13.0–17.0)
MCH: 28.4 pg (ref 26.0–34.0)
MCHC: 32.3 g/dL (ref 30.0–36.0)
MCV: 87.7 fL (ref 78.0–100.0)
Platelets: 107 10*3/uL — ABNORMAL LOW (ref 150–400)
RBC: 3.42 MIL/uL — ABNORMAL LOW (ref 4.22–5.81)
RDW: 19.3 % — ABNORMAL HIGH (ref 11.5–15.5)
WBC: 9.2 10*3/uL (ref 4.0–10.5)

## 2014-03-15 LAB — RENAL FUNCTION PANEL
Albumin: 1.9 g/dL — ABNORMAL LOW (ref 3.5–5.2)
BUN: 5 mg/dL — ABNORMAL LOW (ref 6–23)
CHLORIDE: 92 meq/L — AB (ref 96–112)
CO2: 28 mEq/L (ref 19–32)
Calcium: 8 mg/dL — ABNORMAL LOW (ref 8.4–10.5)
Creatinine, Ser: 0.94 mg/dL (ref 0.50–1.35)
Glucose, Bld: 94 mg/dL (ref 70–99)
POTASSIUM: 2.3 meq/L — AB (ref 3.7–5.3)
Phosphorus: 0.9 mg/dL — CL (ref 2.3–4.6)
SODIUM: 137 meq/L (ref 137–147)

## 2014-03-15 LAB — GLUCOSE, CAPILLARY
Glucose-Capillary: 123 mg/dL — ABNORMAL HIGH (ref 70–99)
Glucose-Capillary: 134 mg/dL — ABNORMAL HIGH (ref 70–99)

## 2014-03-15 MED ORDER — MIDODRINE HCL 5 MG PO TABS
10.0000 mg | ORAL_TABLET | Freq: Three times a day (TID) | ORAL | Status: DC
  Administered 2014-03-15 – 2014-03-26 (×33): 10 mg via ORAL
  Filled 2014-03-15 (×40): qty 2

## 2014-03-15 MED ORDER — HEPARIN SODIUM (PORCINE) 1000 UNIT/ML DIALYSIS
20.0000 [IU]/kg | INTRAMUSCULAR | Status: DC | PRN
  Filled 2014-03-15: qty 5

## 2014-03-15 MED ORDER — MIDODRINE HCL 5 MG PO TABS
ORAL_TABLET | ORAL | Status: AC
  Filled 2014-03-15: qty 1

## 2014-03-15 NOTE — Progress Notes (Signed)
Pt returned from HD, taking over pt from another day RN. Noted that pt had AM labs with a K+ of 2.3 and a phosphorus level of 0.9 during HD session, no notes as to whether or not this was addressed during dialysis. MD notified via text page. Per HD RN labs were drawn during session. Will continue to monitor.

## 2014-03-15 NOTE — Procedures (Signed)
I have seen and examined this patient and agree with the plan of care  Patient comfortable and seen on dialysis  Sherril Croon 03/15/2014, 12:09 PM

## 2014-03-15 NOTE — Progress Notes (Signed)
Pt states he puts himself on and off CPAP, encouraged to call for help or if he has any questions. Will continue to monitor.

## 2014-03-15 NOTE — Progress Notes (Signed)
Patient ID: Douglas [name]  male  [id number]    DOB: 1956/11/07    DOA: 02/25/2014  PCP: [name], MD    Brief interim history   Patient is a 58 year old male with history of follicular low-grade non-Hodgkin lymphoma, large left groin lymphadenopathy, diagnosed in 2000 and was on recent chemotherapy, had admission for acute renal failure 3/17- 3/20. He was suspected to have acute renal failure de to HCTZ and ARB.    He had outpatient left-sided inguinal node biopsy after which he has developed a large lymph filled seroma on the left groin requiring wound VAC placement and continuous drainage, he continues to drain about liter liter and a half on a daily basis, there were some superinfection which is responding well to antibiotics which are presently on board. Have discussed his case multiple times with general surgery who do not recommend any further surgical intervention. Once he has a rehabilitation bed he could be placed in a rehabilitation as he requires intermittent IV fluid boluses for ongoing fluid loss due to daily lymph drainage from the wound VAC.  He is currently stable from medicine standpoint for discharge to a monitored setting like rehabilitation/SNF, case management and social worker have been informed on 03/10/2014 for the same. His long-term prognosis although appears not to good due to underlying lymphoma, ESRD, anasarca and baseline low blood pressures.    SIGNIFICANT EVENTS / STUDIES:   4/3 Hyperkalemia, hyperuricemia, hyperphosphatemia, with ARF requiring emergent CRRT. UA with urate crystals. Renal US normal. No pulmonary edema on CXR. Heparin gtt started due to concern for PE. Started IV vanc for left groin wound.  4/4 ARF thought to be due to ATN from severe sepsis, per oncology not tumor lysis syndrome. No DVT, heparin gtt stopped. 2D-echo normal.  4/5 Continue CRRT, to transition to HD, no renal recovery yet. Hyperuricemia resolved. Dialysis access is a  right IJ dialysis catheter 4/6  .Wound vac placed 4/7 -hemodialysis initiated      Assessment/Plan:   Principal Problem   Acute kidney injury-potentially related to tumor lysis now ESRD :   - Etiology prerenal from volume loss from her fistula versus severe sepsis from left groin infection.  Per Oncology tumor lysis syndrome less likely, Currently undergoing dialysis per renal on a 3 times a week basis. Discussed with renal now likely ESRD.     NHL (non-Hodgkin's lymphoma) with follicular low-grade, large left groin lymph adenopathy status post chemotherapy, hyperuricemia  Hyperuricemia has resolved, oncology following (Dr.Magrinat),  stable G6PD, LDH levels are around 600 which is twice normal, transfuse PRBCs as needed.  CT abdomen for restaging outpatient home along with outpatient followup with oncology post discharge.     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  Since his blood pressures are low, got totaled 2 units of packed RBCs one each on 03/11/2014 and 03/12/2014, no signs of ongoing bleed, post transfusion stable H&H.     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 Hulen Skains on 02/24/2014, patient to be discharged with home health and wound VAC, will follow with Dr. Barkley Bruns in the office post discharge.    - Noted wound growing gram-negative rods was placed on doxycycline + cipro on 02/25/2014 stopped on 03/10/14, monitor temperature curve, leukocytosis improving.      Intermittent  tachycardic and hypotension  Likely due to fluid removal during HD, along with ongoing fluid loss from his left inguinal drain , will  supplement with IV fluids as needed, have discussed with surgery, they have no other solution then to continue drainage this time. Per Dr. Barkley Bruns and Hulen Skains   no further surgical intervention is required. Steroids were tapered off. Increase midodrene. Stable random cortisol and  post transfusion H&H.          Type 2 diabetes mellitus  - Continue sliding scale insulin  CBG (last 3)   Recent Labs  03/14/14 1101 03/14/14 1634 03/14/14 2211  GLUCAP 141* 138* 121*       GERD with history of GI bleed: Continue PPI     Note hypokalemia noted on 03/12/2014 on his lab work  Highly doubt this was patient's blood, he is ESRD patient, this likely was lab. We'll recheck BMP tomorrow. He status post dialysis yesterday. This was predialysis sample.       DVT Prophylaxis: Heparin subcutaneous   Code Status: Full CODE STATUS  Family Communication:  Disposition: Rehabilitation, CIR evaluating   He is currently stable from medicine standpoint for discharge to a monitored setting like rehabilitation/SNF, case management and social worker have been informed on 03/10/2014 for the same    Consultants:  Surgery  Nephrology  Oncology    Procedures:  Dialysis  Wound VAC    Antibiotics:  Anti-infectives   Start     Dose/Rate Route Frequency Ordered Stop   02/18/2014 0953  ceFAZolin (ANCEF) 1-5 GM-% IVPB    Comments:  [name]   : cabinet override      02/21/2014 4098 03/15/2014 2159   02/22/2014 0953  ceFAZolin (ANCEF) 2-3 GM-% IVPB SOLR    Comments:  [name]   : cabinet override      03/15/2014 0953 03/17/2014 2159   03/09/2014 0600  [MAR Hold]  ceFAZolin (ANCEF) 3 g in dextrose 5 % 50 mL IVPB     (On MAR Hold since 02/24/2014 0850)   3 g 160 mL/hr over 30 Minutes Intravenous On call 03/07/14 1556 02/26/2014 1021   03/10/2014 0000  ceFAZolin (ANCEF) IVPB 1 g/50 mL premix  Status:  Discontinued    Comments:  Send with pt to OR   1 g 100 mL/hr over 30 Minutes Intravenous On call 03/07/14 1540 03/07/14 1555   03/01/14 1800  doxycycline (VIBRA-TABS) tablet 100 mg  Status:  Discontinued     100 mg Oral Every 12 hours 03/01/14 1522 03/10/14 1119   03/01/14 0800  ciprofloxacin (CIPRO) tablet 500 mg  Status:  Discontinued     500 mg Oral Daily  with breakfast 02/28/14 1106 03/10/14 1119   02/26/14 0000  doxycycline (VIBRAMYCIN) 100 MG capsule     100 mg Oral 2 times daily 02/26/14 1153     02/26/14 0000  ciprofloxacin (CIPRO) 500 MG tablet     500 mg Oral 2 times daily 02/26/14 1153     02/25/14 1100  ciprofloxacin (CIPRO) IVPB 400 mg  Status:  Discontinued     400 mg 200 mL/hr over 60 Minutes Intravenous Every 12 hours 02/25/14 1047 02/28/14 1106   02/24/14 1830  doxycycline (VIBRAMYCIN) 100 mg in dextrose 5 % 250 mL IVPB  Status:  Discontinued     100 mg 125 mL/hr over 120 Minutes Intravenous Every 12 hours 02/24/14 1756 03/01/14 1521   02/24/14 1800  cefTRIAXone (ROCEPHIN) 1 g in dextrose 5 % 50 mL IVPB  Status:  Discontinued     1 g 100 mL/hr over 30 Minutes Intravenous Every 24 hours 02/24/14 1231 02/24/14 1756  02/24/14 1200  vancomycin (VANCOCIN) 1,500 mg in sodium chloride 0.9 % 250 mL IVPB  Status:  Discontinued     1,500 mg 250 mL/hr over 60 Minutes Intravenous  Once 02/23/14 1423 02/24/14 1231   02/20/14 2200  vancomycin (VANCOCIN) 1,500 mg in sodium chloride 0.9 % 500 mL IVPB  Status:  Discontinued     1,500 mg 250 mL/hr over 120 Minutes Intravenous Every 24 hours 02/21/14 2119 02/22/14 1116   February 21, 2014 2200  vancomycin (VANCOCIN) 2,000 mg in sodium chloride 0.9 % 500 mL IVPB     2,000 mg 250 mL/hr over 120 Minutes Intravenous  Once 21-Feb-2014 2119 02/20/14 0005     Subjective:  Patient in bed, no fever chills, no headache chest abdominal pain, no shortness of breath, generalized weakness.  Objective: Weight change: 1 kg (2 lb 3.3 oz)  Intake/Output Summary (Last 24 hours) at 03/15/14 0936 Last data filed at 03/15/14 0700  Gross per 24 hour  Intake    600 ml  Output     50 ml  Net    550 ml   Blood pressure 88/65, pulse 111, temperature 98.5 F (36.9 C), temperature source Oral, resp. rate 20, height 5' 10.08" (1.78 m), weight 220 kg (485 lb 0.2 oz), SpO2 93.00%.  Physical Exam: General: Alert and awake,  oriented x3, not in any acute distress. CVS: S1-S2 clear, no murmur rubs or gallops Chest: clear to auscultation bilaterally, no wheezing, rales or rhonchi Abdomen: soft  nondistended, normal bowel sounds  Extremities: no cyanosis, clubbing. 3+ edema noted bilaterally, left groin has a large fluctuant fluid collection with wound VAC drain in place   Lab Results: Basic Metabolic Panel:  Recent Labs Lab 03/12/14 1238 03/14/14 0540  NA 136* 132*  K 2.2* 5.3  CL 92* 88*  CO2 27 22  GLUCOSE 101* 129*  BUN 6 48*  CREATININE 1.01 4.78*  CALCIUM 7.8* 9.0  PHOS 1.1* 6.0*   Liver Function Tests:  Recent Labs Lab 03/12/14 1238 03/14/14 0540  ALBUMIN 2.1* 1.7*   No results found for this basename: LIPASE, AMYLASE,  in the last 168 hours No results found for this basename: AMMONIA,  in the last 168 hours CBC:  Recent Labs Lab 03/14/14 0540 03/15/14 0500  WBC 8.0 9.2  NEUTROABS 5.6  --   HGB 8.8* 9.7*  HCT 27.0* 30.0*  MCV 87.9 87.7  PLT 87* 107*   Cardiac Enzymes: No results found for this basename: CKTOTAL, CKMB, CKMBINDEX, TROPONINI,  in the last 168 hours BNP: No components found with this basename: POCBNP,  CBG:  Recent Labs Lab 03/13/14 2051 03/14/14 0748 03/14/14 1101 03/14/14 1634 03/14/14 2211  GLUCAP 136* 144* 141* 138* 121*     Micro Results: No results found for this or any previous visit (from the past 240 hour(s)).  Studies/Results: Dg Chest 1 View  02/03/2014   CLINICAL DATA:  Central catheter placement  EXAM: CHEST - 1 VIEW  COMPARISON:  Chest CT December 02, 2013  FINDINGS: Central catheter tip is in the right axillary vein region. There is again noted a 1.7 x 1.3 cm nodular opacity in the right midlung. There is no edema or consolidation. Heart is mildly enlarged with normal pulmonary vascularity. No apparent adenopathy. No pneumothorax. No bone lesions.  IMPRESSION: Central catheter tip in right axillary vein. Nodular opacity right mid lung. No  edema or consolidation.   Electronically Signed   By: Bretta Bang M.D.   On: 02/03/2014 07:06  Dg Chest 2 View  02/16/2014   CLINICAL DATA:  2 week history shortness of breath ; the patient has a history of non-Hodgkin's lymphoma  EXAM: CHEST  2 VIEW  COMPARISON:  DG CHEST 1 VIEW dated 02/03/2014  FINDINGS: The lungs are adequately inflated. There is an abnormal soft tissue density nodule that projects in the lower 1/3 of the right hemithorax on the frontal view. It measures 1.4 x 1.9 cm in diameter. It may lie anteriorly in the right middle lobe. This is similar in appearance to that demonstrated on the previous study. Elsewhere no definite pulmonary nodules are demonstrated. There is no alveolar infiltrate. The cardiac silhouette is normal in size. The pulmonary vascularity is not engorged. There is mild degenerative change of the thoracic spine with calcification of the anterior longitudinal ligament.  IMPRESSION: 1. There is persistent nodularity likely in the right middle lobe. There is no definite evidence of pneumonia. 2. There is no evidence of CHF. 3. Further evaluation of the thorax with chest CT scanning is recommended. This would allow further evaluation of the mediastinum and hilar regions as well as the pulmonary parenchyma given the patient's symptoms of shortness of breath.   Electronically Signed   By: David  Martinique   On: 02/16/2014 10:56   US Renal  03/13/14   CLINICAL DATA:  Evaluate for hydronephrosis  EXAM: RENAL/URINARY TRACT ULTRASOUND COMPLETE  COMPARISON:  US RENAL dated 02/03/2014; [place] ART/VEN FLOW ABD PELV DOPPLER dated 11/27/2013; US SCROTUM dated 11/27/2013  FINDINGS: Right Kidney:  Length: 11.5 cm. Echogenicity within normal limits. No mass or hydronephrosis visualized.  Left Kidney:  Length: 12.2 cm. Echogenicity within normal limits. No mass or hydronephrosis visualized.  Bladder:  Urinary bladder is decompressed, patient is status post Foley catheter insertion.  IMPRESSION:  Negative renal ultrasound.   Electronically Signed   By: Margaree Mackintosh M.D.   On: March 13, 2014 16:09   US Renal  02/03/2014   CLINICAL DATA:  Acute kidney injury.  EXAM: RENAL/URINARY TRACT ULTRASOUND COMPLETE  COMPARISON:  CT ABD/PELVIS W CM dated 10/30/2013  FINDINGS: Right Kidney:  Length: 11.6 cm. Echogenicity within normal limits. No mass or hydronephrosis visualized.  Left Kidney:  Length: 11.8 cm. Echogenicity within normal limits. No mass or hydronephrosis visualized.  Bladder:  Appears normal for degree of bladder distention.  IMPRESSION: Unremarkable study.   Electronically Signed   By: Rolm Baptise M.D.   On: 02/03/2014 16:56   Dg Chest Port 1 View  02/23/2014   CLINICAL DATA:  Respiratory distress.  EXAM: PORTABLE CHEST - 1 VIEW  COMPARISON:  DG CHEST 1V PORT dated 02/20/2014; CT CHEST W/CM dated 12/02/2013; DG CHEST 1V PORT dated Mar 13, 2014; DG CHEST 1V PORT dated Mar 13, 2014  FINDINGS: Cardiomediastinal silhouette remains stable and normal. Dialysis catheter tips SVC RA junction, unchanged. No focal infiltrates, although right lung nodular densities persist. Negative osseous structures.  IMPRESSION: No active infiltrates.   Electronically Signed   By: Rolla Flatten M.D.   On: 02/23/2014 08:10   Dg Chest Port 1 View  02/20/2014   CLINICAL DATA:  Shortness of breath  EXAM: PORTABLE CHEST - 1 VIEW  COMPARISON:  03/13/14  FINDINGS: A temporary dialysis catheter is again noted on the right. The cardiac shadow is stable. The lungs are well aerated bilaterally without focal infiltrate or sizable effusion.  IMPRESSION: No acute abnormality noted.   Electronically Signed   By: Inez Catalina M.D.   On: 02/20/2014 08:08   Dg Chest [facility]  1 View  03-12-2014   CLINICAL DATA:  Status post central line placement  EXAM: PORTABLE CHEST - 1 VIEW  COMPARISON:  Mar 12, 2014 1251 hrs  FINDINGS: The cardiac shadow is stable. A temporary dialysis catheter is now seen in the distal superior vena cava. No pneumothorax is noted.  Mild atelectatic changes are noted in the right base. This may be related to the poor inspiratory effort.  IMPRESSION: Status post temporary dialysis catheter placement without pneumothorax.   Electronically Signed   By: Inez Catalina M.D.   On: 2014/03/12 19:31   Dg Chest Portable 1 View  2014/03/12   CLINICAL DATA:  Shortness of breath. Non-Hodgkin's lymphoma. Diabetes and hypertension.  EXAM: PORTABLE CHEST - 1 VIEW  COMPARISON:  DG CHEST 2 VIEW dated 02/16/2014; DG CHEST 1 VIEW dated 02/03/2014; CT CHEST W/CM dated 12/02/2013  FINDINGS: The heart is mildly enlarged. Right middle lobe nodule appears larger, now measuring 2.7 cm. Probable right upper lobe nodule is only partially seen, measuring approximately 1 cm in diameter radiographically. Lesion was larger based on prior CT evaluation. There is slight prominence the left hilar region but this is felt to be stable compared with prior radiographs and CT. No new consolidation or pleural effusion.  IMPRESSION: Persistent right lung nodules. Right middle lobe nodule is larger ring compared prior studies.   Electronically Signed   By: Shon Hale M.D.   On: Mar 12, 2014 13:08    Medications: Scheduled Meds: . bisacodyl  10 mg Rectal Daily  . docusate sodium  200 mg Oral BID  . feeding supplement (NEPRO CARB STEADY)  237 mL Oral BID BM  . heparin subcutaneous  5,000 Units Subcutaneous 3 times per day  . insulin aspart  0-9 Units Subcutaneous TID WC  . midodrine      . midodrine  5 mg Oral TID WC  . multivitamin  1 tablet Oral QHS  . pantoprazole  40 mg Oral Q1200  . polyethylene glycol  17 g Oral Daily  . sodium bicarbonate  1,300 mg Oral TID  . sodium chloride  3 mL Intravenous Q12H      LOS: 24 days   Thurnell Lose M.D. [facility] 03/15/2014, 9:36 AM Pager: [phone]  If 7PM-7AM, please contact night-coverage [email or web address] Password [email or web address]

## 2014-03-15 NOTE — Consult Note (Signed)
WOC contacted surgery group to notify them that the drainage has consistently had foul odor, more and more at each dressing changes.  Now has new open area in the left groin that is concerning to communicate with existing wound.  Has had NPWT x almost 1 month and track as not closed any. When NPWT VAC dressing is removed able to express large amounts of lymphatic fluid, that is more cloudy some days that others.  Surgery to note VAC canister output in nursing flow sheets.   Discussed with Coralie Keens PA, she will notify surgery team to make decisions on any further follow up from the surgery team.  VAC scheduled to be changed T/FRI.   Hazeline Charnley Dellrose RN,CWOCN [phone]

## 2014-03-15 NOTE — Progress Notes (Signed)
PT Cancellation Note  Patient Details Name: Yonah Tangeman MRN: [id number] DOB: [birth year]   Cancelled Treatment:    Reason Eval/Treat Not Completed: Patient at procedure or test/unavailable Currently in HD; Will follow-up later today as time allows, otherwise will return for PT session tomorrow.     [facility] 03/15/2014, 8:28 AM  [name], PT  Acute Rehabilitation Services Pager [phone] Office [phone]

## 2014-03-16 DIAGNOSIS — T8189XA Other complications of procedures, not elsewhere classified, initial encounter: Secondary | ICD-10-CM

## 2014-03-16 LAB — CBC WITH DIFFERENTIAL/PLATELET
HEMATOCRIT: 28.7 % — AB (ref 39.0–52.0)
Hemoglobin: 9.2 g/dL — ABNORMAL LOW (ref 13.0–17.0)
MCH: 28.1 pg (ref 26.0–34.0)
MCHC: 32.1 g/dL (ref 30.0–36.0)
MCV: 87.8 fL (ref 78.0–100.0)
Platelets: 114 10*3/uL — ABNORMAL LOW (ref 150–400)
RBC: 3.27 MIL/uL — AB (ref 4.22–5.81)
RDW: 19.5 % — ABNORMAL HIGH (ref 11.5–15.5)
WBC: 10.1 10*3/uL (ref 4.0–10.5)

## 2014-03-16 LAB — GLUCOSE, CAPILLARY
Glucose-Capillary: 171 mg/dL — ABNORMAL HIGH (ref 70–99)
Glucose-Capillary: 180 mg/dL — ABNORMAL HIGH (ref 70–99)
Glucose-Capillary: 193 mg/dL — ABNORMAL HIGH (ref 70–99)
Glucose-Capillary: 194 mg/dL — ABNORMAL HIGH (ref 70–99)
Glucose-Capillary: 141 mg/dL — ABNORMAL HIGH (ref 70–99)

## 2014-03-16 LAB — CBC
HEMATOCRIT: 29 % — AB (ref 39.0–52.0)
Hemoglobin: 9.2 g/dL — ABNORMAL LOW (ref 13.0–17.0)
MCH: 28.8 pg (ref 26.0–34.0)
MCHC: 31.7 g/dL (ref 30.0–36.0)
MCV: 90.6 fL (ref 78.0–100.0)
PLATELETS: 117 10*3/uL — AB (ref 150–400)
RBC: 3.2 MIL/uL — ABNORMAL LOW (ref 4.22–5.81)
RDW: 19.3 % — ABNORMAL HIGH (ref 11.5–15.5)
WBC: 9.3 10*3/uL (ref 4.0–10.5)

## 2014-03-16 LAB — POTASSIUM: Potassium: 4.3 mEq/L (ref 3.7–5.3)

## 2014-03-16 MED ORDER — SODIUM CHLORIDE 0.9 % IV BOLUS (SEPSIS)
250.0000 mL | Freq: Once | INTRAVENOUS | Status: AC
  Administered 2014-03-16: 250 mL via INTRAVENOUS

## 2014-03-16 NOTE — Progress Notes (Signed)
Patient ID: Douglas [name], male   DOB: 1956/02/17, 58 y.o.   MRN: [id number] 8 Days Post-Op  Subjective: WOC, RN asked [place] to evaluate this guy's wound as his output from his VAC has changed.  It is more brown and has a foul odor.  The patient states he does not have an increase in pain.  Objective: Vital signs in last 24 hours: Temp:  [98 F (36.7 C)-99.1 F (37.3 C)] 98.5 F (36.9 C) (04/28 0737) Pulse Rate:  [90-114] 101 (04/28 0737) Resp:  [18-20] 20 (04/28 0737) BP: (84-125)/(50-73) 107/69 mmHg (04/28 0737) SpO2:  [92 %-94 %] 92 % (04/28 0737) Weight:  [481 lb 0.7 oz (218.2 kg)] 481 lb 0.7 oz (218.2 kg) (04/27 1112) Last BM Date: 03/15/14  Intake/Output from previous day: 04/27 0701 - 04/28 0700 In: 360 [P.O.:360] Out: 2435 [Drains:100] Intake/Output this shift:    PE: Skin:  Wound is uncovered and foul odor experienced.  Thin brown dishwater type drainage expressed from wound.  Wound tunnel down to 7cm from 12cm.  Significant lymph edema of his entire leg.  Small wound in his inguinal crease on this same side.    Lab Results:   Recent Labs  03/15/14 0500 03/16/14 0615  WBC 9.2 10.1  HGB 9.7* 9.2*  HCT 30.0* 28.7*  PLT 107* 114*   BMET  Recent Labs  03/14/14 0540 03/15/14 1030  NA 132* 137  K 5.3 2.3*  CL 88* 92*  CO2 22 28  GLUCOSE 129* 94  BUN 48* 5*  CREATININE 4.78* 0.94  CALCIUM 9.0 8.0*   PT/INR No results found for this basename: LABPROT, INR,  in the last 72 hours CMP     Component Value Date/Time   NA 137 03/15/2014 1030   NA 137 01/19/2014 1101   K 2.3* 03/15/2014 1030   K 3.7 01/19/2014 1101   CL 92* 03/15/2014 1030   CO2 28 03/15/2014 1030   CO2 27 01/19/2014 1101   GLUCOSE 94 03/15/2014 1030   GLUCOSE 204* 01/19/2014 1101   BUN 5* 03/15/2014 1030   BUN 15.8 01/19/2014 1101   CREATININE 0.94 03/15/2014 1030   CREATININE 0.9 01/19/2014 1101   CALCIUM 8.0* 03/15/2014 1030   CALCIUM 9.8 01/19/2014 1101   PROT 6.8 02/24/2014 0545   PROT 7.3 01/19/2014  1101   ALBUMIN 1.9* 03/15/2014 1030   ALBUMIN 2.0* 01/19/2014 1101   AST 33 02/24/2014 0545   AST 23 01/19/2014 1101   ALT 11 02/24/2014 0545   ALT 15 01/19/2014 1101   ALKPHOS 85 02/24/2014 0545   ALKPHOS 186* 01/19/2014 1101   BILITOT 0.3 02/24/2014 0545   BILITOT 0.44 01/19/2014 1101   GFRNONAA >90 03/15/2014 1030   GFRAA >90 03/15/2014 1030   Lipase  No results found for this basename: lipase       Studies/Results: No results found.  Anti-infectives: Anti-infectives   Start     Dose/Rate Route Frequency Ordered Stop   03/15/2014 0953  ceFAZolin (ANCEF) 1-5 GM-% IVPB    Comments:  [name]   : cabinet override      03/09/2014 4580 03/06/2014 2159   03/04/2014 0953  ceFAZolin (ANCEF) 2-3 GM-% IVPB SOLR    Comments:  [name]   : cabinet override      03/07/2014 0953 02/20/2014 2159   02/23/2014 0600  [MAR Hold]  ceFAZolin (ANCEF) 3 g in dextrose 5 % 50 mL IVPB     (On MAR Hold since 03/17/2014 0850)  3 g 160 mL/hr over 30 Minutes Intravenous On call 03/07/14 1556 03/17/2014 1021   03/10/2014 0000  ceFAZolin (ANCEF) IVPB 1 g/50 mL premix  Status:  Discontinued    Comments:  Send with pt to OR   1 g 100 mL/hr over 30 Minutes Intravenous On call 03/07/14 1540 03/07/14 1555   03/01/14 1800  doxycycline (VIBRA-TABS) tablet 100 mg  Status:  Discontinued     100 mg Oral Every 12 hours 03/01/14 1522 03/10/14 1119   03/01/14 0800  ciprofloxacin (CIPRO) tablet 500 mg  Status:  Discontinued     500 mg Oral Daily with breakfast 02/28/14 1106 03/10/14 1119   02/26/14 0000  doxycycline (VIBRAMYCIN) 100 MG capsule     100 mg Oral 2 times daily 02/26/14 1153     02/26/14 0000  ciprofloxacin (CIPRO) 500 MG tablet     500 mg Oral 2 times daily 02/26/14 1153     02/25/14 1100  ciprofloxacin (CIPRO) IVPB 400 mg  Status:  Discontinued     400 mg 200 mL/hr over 60 Minutes Intravenous Every 12 hours 02/25/14 1047 02/28/14 1106   02/24/14 1830  doxycycline (VIBRAMYCIN) 100 mg in dextrose 5 % 250 mL IVPB  Status:   Discontinued     100 mg 125 mL/hr over 120 Minutes Intravenous Every 12 hours 02/24/14 1756 03/01/14 1521   02/24/14 1800  cefTRIAXone (ROCEPHIN) 1 g in dextrose 5 % 50 mL IVPB  Status:  Discontinued     1 g 100 mL/hr over 30 Minutes Intravenous Every 24 hours 02/24/14 1231 02/24/14 1756   02/24/14 1200  vancomycin (VANCOCIN) 1,500 mg in sodium chloride 0.9 % 250 mL IVPB  Status:  Discontinued     1,500 mg 250 mL/hr over 60 Minutes Intravenous  Once 02/23/14 1423 02/24/14 1231   02/20/14 2200  vancomycin (VANCOCIN) 1,500 mg in sodium chloride 0.9 % 500 mL IVPB  Status:  Discontinued     1,500 mg 250 mL/hr over 120 Minutes Intravenous Every 24 hours 03/11/2014 2119 02/22/14 1116   03/09/2014 2200  vancomycin (VANCOCIN) 2,000 mg in sodium chloride 0.9 % 500 mL IVPB     2,000 mg 250 mL/hr over 120 Minutes Intravenous  Once 03/01/2014 2119 02/20/14 0005       Assessment/Plan  1. Open biopsy site; left groin with Non Hodgkin's lymphoma. Lymphatic leak  Plan: 1. We will dc his VAC for now and start WD dressing changes so that we can evaluate the wound and incase this is infected this will help clean it up. 2. Send fluid for culture. 3. Will d/w MD   LOS: 25 days    Henreitta Cea 03/16/2014, 9:57 AM Pager: [phone]

## 2014-03-16 NOTE — Progress Notes (Signed)
Occupational Therapy Treatment Patient Details Name: Douglas [name] MRN: [id number] DOB: 10/01/1956 Today's Date: 03/16/2014    History of present illness 58 yo M with pmh of follicular low-grade non-Hodgkin's lymphoma with large left groin lymphadenopathy diagnosed in 2009 with recent chemotherapy and admission for AKI 3/17-3/20 who presented as a transfer from [place] with 1.5 week history of shortness of breath found to be volume overloaded with hyperkalemia and acute renal failure requiring emergent HD.     OT comments  Pt making slow progress with functional goals and should continue with acute OT services to increase level of function and safety. Reiterated to patient the importance of performing B UE strengthening exercises outside of OT tx session with theraband provided to him. Pt continues to require +2-3 assist for bed mobility and mechanical lift for transfers  Follow Up Recommendations  CIR    Equipment Recommendations  None recommended by OT;Other (comment) (TBD)    Recommendations for Other Services      Precautions / Restrictions Precautions Precautions: Fall Precaution Comments: 454#. LLE lymphedema, wound VAC Restrictions Weight Bearing Restrictions: No       Mobility Bed Mobility Overal bed mobility: Needs Assistance Bed Mobility: Supine to Sit;Sit to Supine Rolling: Mod assist;+2 for physical assistance   Supine to sit: +2 for physical assistance;Mod assist Sit to supine: +2 for physical assistance      Transfers Overall transfer level: Needs assistance Equipment used: 2 person hand held assist Transfers: Sit to/from Stand Sit to Stand: +2 physical assistance;Max assist   Squat pivot transfers: +2 physical assistance;Min assist     General transfer comment: attempted to stand from bed x 2 with elevated surface, momentum strategy and assist of 3, but pt unable to achieve full upright standing. Pt was able assist more and to clear hips from bed  minimally this session    Balance Overall balance assessment: Needs assistance Sitting-balance support: Single extremity supported;Bilateral upper extremity supported;Feet supported Sitting balance-Leahy Scale: Fair Sitting balance - Comments: UE assist for balance at edge of deflated bed and lower bed rail removed; cues for anterior weight shift; sat about 12 minutes                           ADL       Grooming: Wash/dry hands;Wash/dry face;Set up;Bed level Grooming Details (indicate cue type and reason): min  A for balance at EOB, cues for upright posture and hand placement                                      Vision  wears glasses for distance only per pt                              Cognition   Behavior During Therapy: [facility] for tasks assessed/performed Overall Cognitive Status: Within Functional Limits for tasks assessed                                      Exercises General Exercises - Upper Extremity Shoulder Flexion: Strengthening;Both;10 reps;Theraband Shoulder Extension: Strengthening;Both;Theraband;10 reps Shoulder ABduction: Strengthening;10 reps;Both;Theraband Shoulder ADduction: 10 reps;Strengthening;Both;Theraband Elbow Flexion: Strengthening;10 reps;Both;Theraband Elbow Extension: Strengthening;Both;10 reps;Theraband          General Comments  pt  pleasant and cooperative    Pertinent Vitals/ Pain       No c/o pain, VSS                                                          Frequency Min 2X/week     Progress Toward Goals  OT Goals(current goals can now be found in the care plan section)  Progress towards OT goals: Progressing toward goals     Plan Discharge plan remains appropriate    Co-evaluation    PT/OT/SLP Co-Evaluation/Treatment: Yes Reason for Co-Treatment: Complexity of the patient's impairments (multi-system involvement);For patient/therapist safety    OT goals addressed during session: ADL's and self-care;Strengthening/ROM      End of Session     Activity Tolerance Patient limited by fatigue   Patient Left with call bell/phone within reach;in bed             Time: 1138-1221 OT Time Calculation (min): 43 min  Charges: OT General Charges $OT Visit: 1 Procedure OT Treatments $Therapeutic Activity: 8-22 mins $Therapeutic Exercise: 8-22 mins  Mosetta Putt 03/16/2014, 3:13 PM

## 2014-03-16 NOTE — Progress Notes (Signed)
I have seen and examined the pt and agree with PA-Osborne's progress note. Does not appear infected Would con't to TID dressing changed to help clean up wound.  Will likely be OK for vac in a few days.

## 2014-03-16 NOTE — Consult Note (Signed)
WOC wound follow up Wound type: lymphatic leak left groin, new full thickness ulceration that is deep in the skin fold.  We probed this today, it is not fluctuant in the base and does not communicate with the other wound that I can tell today.  [facility] -Saverio Danker NP at the bedside with me to assess due to change in odor and change in consistency of drainage. Groin is more edematous today and with warmness to the touch.  Measurement: 2.5cmx 6.5cm x 0.5cm with tunnel at 9 o'clock that is now 7cm ; New area distal, deep in the groin is 1.5cm x 3.0cm x 0.2cm  Wound bed: 100% granulation tissue in the wound with leak and tunnel 100% yellow slough in the more medial wound in the groin/fold Drainage (amount, consistency, odor) change in odor, more foul-can smell from door. Muddy water appearance, foul. Did not actively drain like normal when I removed the VAC dressing, however when I palpate the area I can express purulent, brown drainage.  Periwound: induration, erythema.  No change in the patient's level of pain, no new pain or discomfort Dressing procedure/placement/frequency: DC VAC for now so that surgery team can further evaluate patient. Moist to moist dressing used to fill tunnel and wound bed. Changed silver alginate in the skin fold ulcer.   Notified bedside nurse of wound care update/changes.  Wound culture obtained by WOC while at the bedside per [facility] order, sent per bedside nursing.   [facility] team will follow along with you for continued care as needed.  Hung Rhinesmith [place] RN,CWOCN [phone]

## 2014-03-16 NOTE — Progress Notes (Signed)
NUTRITION FOLLOW-UP  DOCUMENTATION CODES Per approved criteria  -Morbid Obesity   INTERVENTION: Continue Nepro Shake po BID, each supplement provides 425 kcal and 19 grams protein.  RD to continue to follow nutrition care plan.  NUTRITION DIAGNOSIS: Increased nutrient needs related to wound healing and HD as evidenced by estimated needs. Ongoing.  Goal: Intake to meet >90% of estimated nutrition needs.  Monitor:  weight trends, lab trends, I/O's, PO intake, supplement tolerance  ASSESSMENT: PMHx significant for DM, HTN, dyslipidemia, morbid obesity, low-grade follicular NHL - supposed to have palliative XRT, receiving chemotherapy. Admitted with LLE swelling and increased SOB. Work-up reveals AKI.  Received CRRT 4/3 - 4/5. IHD initiated 4/7. Underwent HD catheter placement 4/20.  Pt with nonhealing L inguinal wound s/p bx in Dec 2014 and I&D in Mar 2015. VAC placed 4/6, dressing changes 2 x week. Per [facility] RN note on 4/24 - pt with development of new wound to L inner groin. WOC RN revisited pt on 4/27 and contacted surgery team as wound drainage now has increased odor and increased in amount of drainage. VAC then discontinued on 4/28 by surgery team to better assess wound.  Ordered for Renal Diet, intake has improved, pt eating approx 75% of meals. Continues to receive Nepro Shakes PO BID.  RD provided renal education on 4/23.  Sodium WNL Potassium now WNL Phosphorus low at 0.9  Height: Ht Readings from Last 1 Encounters:  03/14/14 5' 10.08" (1.78 m)    Weight: Wt Readings from Last 1 Encounters:  03/15/14 481 lb 0.7 oz (218.2 kg)  214.1 kg s/p HD 4/22 212.1 kg s/p HD 4/17 201.4 kg s/p HD 4/10  BMI:  Body mass index is 68.87 kg/(m^2). Obese Class III  Estimated Nutritional Needs: Kcal: 2300 - 2600 kcal Protein: 130 - 150 g Fluid: 1.2 liters  Skin:  Stage II to mid sacrum Closed R neck incision Thigh incision Groin incision - wound VAC in place  Diet Order:  Renal w/ 1200 ml Fluid Restriction    Intake/Output Summary (Last 24 hours) at 03/16/14 1221 Last data filed at 03/16/14 0616  Gross per 24 hour  Intake    360 ml  Output    100 ml  Net    260 ml    Last BM: 4/27  Labs:   Recent Labs Lab 03/12/14 1238 03/14/14 0540 03/15/14 1030 03/16/14 1005  NA 136* 132* 137  --   K 2.2* 5.3 2.3* 4.3  CL 92* 88* 92*  --   CO2 27 22 28   --   BUN 6 48* 5*  --   CREATININE 1.01 4.78* 0.94  --   CALCIUM 7.8* 9.0 8.0*  --   PHOS 1.1* 6.0* 0.9*  --   GLUCOSE 101* 129* 94  --     CBG (last 3)   Recent Labs  03/15/14 1630 03/15/14 2014 03/16/14 0735  GLUCAP 134* 171* 180*    Scheduled Meds: . bisacodyl  10 mg Rectal Daily  . docusate sodium  200 mg Oral BID  . feeding supplement (NEPRO CARB STEADY)  237 mL Oral BID BM  . heparin subcutaneous  5,000 Units Subcutaneous 3 times per day  . insulin aspart  0-9 Units Subcutaneous TID WC  . midodrine  10 mg Oral TID WC  . multivitamin  1 tablet Oral QHS  . pantoprazole  40 mg Oral Q1200  . polyethylene glycol  17 g Oral Daily  . sodium bicarbonate  1,300 mg Oral TID  .  sodium chloride  3 mL Intravenous Q12H    Continuous Infusions: . sodium chloride Stopped (03/15/14 0700)    Inda Coke MS, RD, LDN Inpatient Registered Dietitian Pager: [phone] After-hours pager: [phone]

## 2014-03-16 NOTE — Progress Notes (Signed)
Checked with pt about CPAP; pt needs nothing as of now, will place himself on, and call if anything is needed.

## 2014-03-16 NOTE — Progress Notes (Signed)
Utilization review completed.  

## 2014-03-16 NOTE — Progress Notes (Signed)
Pt continues to not be at a level to be functionally able to tolerate intense rehab or get [facility] approval for admission. I continue to follow his progress. [phone]

## 2014-03-16 NOTE — Progress Notes (Signed)
[facility] ROUNDING NOTE   Subjective:   Interval History: no new complaints  Objective:  Vital signs in last 24 hours:  Temp:  [98 F (36.7 C)-99.1 F (37.3 C)] 98.5 F (36.9 C) (04/28 0737) Pulse Rate:  [90-113] 101 (04/28 0737) Resp:  [18-20] 20 (04/28 0737) BP: (84-125)/(52-73) 107/69 mmHg (04/28 0737) SpO2:  [92 %-94 %] 92 % (04/28 0737) Weight:  [218.2 kg (481 lb 0.7 oz)] 218.2 kg (481 lb 0.7 oz) (04/27 1112)  Weight change: 0 kg (0 lb) Filed Weights   03/14/14 2214 03/15/14 0711 03/15/14 1112  Weight: 220 kg (485 lb 0.2 oz) 220 kg (485 lb 0.2 oz) 218.2 kg (481 lb 0.7 oz)    Intake/Output: I/O last 3 completed shifts: In: 109 [P.O.:720] Out: 2435 [Drains:100; Other:2335]   Intake/Output this shift:     CVS- RRR RS- CTA ABD- BS present soft non-distended EXT- :+brawny edema, wound vac on left thigh    Basic Metabolic Panel:  Recent Labs Lab 03/10/14 1250 03/11/14 0544 03/12/14 1238 03/14/14 0540 03/15/14 1030  NA 132* 133* 136* 132* 137  K 4.7 4.7 2.2* 5.3 2.3*  CL 86* 87* 92* 88* 92*  CO2 17* 20 27 22 28   GLUCOSE 102* 137* 101* 129* 94  BUN 31* 44* 6 48* 5*  CREATININE 3.34* 4.30* 1.01 4.78* 0.94  CALCIUM 8.8 8.8 7.8* 9.0 8.0*  PHOS 5.1* 5.6* 1.1* 6.0* 0.9*    Liver Function Tests:  Recent Labs Lab 03/10/14 1250 03/11/14 0544 03/12/14 1238 03/14/14 0540 03/15/14 1030  ALBUMIN 2.1* 1.9* 2.1* 1.7* 1.9*   No results found for this basename: LIPASE, AMYLASE,  in the last 168 hours No results found for this basename: AMMONIA,  in the last 168 hours  CBC:  Recent Labs Lab 03/10/14 0500 03/11/14 0544 03/12/14 1204 03/14/14 0540 03/15/14 0500 03/16/14 0615  WBC 11.4* 9.5 9.6 8.0 9.2 10.1  NEUTROABS 7.7 6.9 7.0 5.6  --  QUESTIONABLE RESULTS, RECOMMEND RECOLLECT TO VERIFY  HGB 7.4* 7.0* 7.9* 8.8* 9.7* 9.2*  HCT 22.7* 21.3* 23.4* 27.0* 30.0* 28.7*  MCV 85.0 85.5 83.9 87.9 87.7 87.8  PLT 105* 93* 94* 87* 107* 114*     Cardiac Enzymes: No results found for this basename: CKTOTAL, CKMB, CKMBINDEX, TROPONINI,  in the last 168 hours  BNP: No components found with this basename: POCBNP,   CBG:  Recent Labs Lab 03/14/14 2211 03/15/14 1215 03/15/14 1630 03/15/14 2014 03/16/14 0735  GLUCAP 121* 123* 134* 171* 180*    Microbiology: Results for orders placed during the hospital encounter of 2014/03/17  MRSA PCR SCREENING     Status: Abnormal   Collection Time    2014/03/17  5:27 PM      Result Value Ref Range Status   MRSA by PCR POSITIVE (*) NEGATIVE Final   Comment:            The GeneXpert MRSA Assay (FDA     approved for NASAL specimens     only), is one component of a     comprehensive MRSA colonization     surveillance program. It is not     intended to diagnose MRSA     infection nor to guide or     monitor treatment for     MRSA infections.     RESULT CALLED TO, READ BACK BY AND VERIFIED WITH:     Linda Hedges RN 2154 Mar 17, 2014 A BROWNING  WOUND CULTURE     Status: None  Collection Time    02-26-14  5:27 PM      Result Value Ref Range Status   Specimen Description WOUND   Final   Special Requests IMMUNOCOMPROMISED LEFT INGUINAL AREA   Final   Gram Stain     Final   Value: NO WBC SEEN     NO SQUAMOUS EPITHELIAL CELLS SEEN     RARE GRAM NEGATIVE RODS     Performed at [facility]   Culture     Final   Value: MULTIPLE ORGANISMS PRESENT, NONE PREDOMINANT     Note: NO STAPHYLOCOCCUS AUREUS ISOLATED NO GROUP A STREP (S.PYOGENES) ISOLATED     Performed at [facility]   Report Status 02/22/2014 FINAL   Final  CULTURE, BLOOD (ROUTINE X 2)     Status: None   Collection Time    02/21/14  5:31 PM      Result Value Ref Range Status   Specimen Description BLOOD LEFT HAND   Final   Special Requests BOTTLES DRAWN AEROBIC ONLY [place]   Final   Culture  Setup Time     Final   Value: 02/22/2014 02:22     Performed at [facility]   Culture     Final   Value: NO GROWTH 5  DAYS     Performed at [facility]   Report Status 02/28/2014 FINAL   Final  CULTURE, BLOOD (ROUTINE X 2)     Status: None   Collection Time    02/21/14  5:40 PM      Result Value Ref Range Status   Specimen Description BLOOD LEFT HAND   Final   Special Requests BOTTLES DRAWN AEROBIC ONLY 10 CC   Final   Culture  Setup Time     Final   Value: 02/22/2014 02:23     Performed at [facility]   Culture     Final   Value: NO GROWTH 5 DAYS     Performed at [facility]   Report Status 02/28/2014 FINAL   Final    Coagulation Studies: No results found for this basename: LABPROT, INR,  in the last 72 hours  Urinalysis: No results found for this basename: COLORURINE, APPERANCEUR, LABSPEC, PHURINE, GLUCOSEU, HGBUR, BILIRUBINUR, KETONESUR, PROTEINUR, UROBILINOGEN, NITRITE, LEUKOCYTESUR,  in the last 72 hours    Imaging: No results found.   Medications:   . sodium chloride Stopped (03/15/14 0700)   . bisacodyl  10 mg Rectal Daily  . docusate sodium  200 mg Oral BID  . feeding supplement (NEPRO CARB STEADY)  237 mL Oral BID BM  . heparin subcutaneous  5,000 Units Subcutaneous 3 times per day  . insulin aspart  0-9 Units Subcutaneous TID WC  . midodrine  10 mg Oral TID WC  . multivitamin  1 tablet Oral QHS  . pantoprazole  40 mg Oral Q1200  . polyethylene glycol  17 g Oral Daily  . sodium bicarbonate  1,300 mg Oral TID  . sodium chloride  3 mL Intravenous Q12H   diphenhydrAMINE, guaiFENesin-dextromethorphan, heparin, hydrOXYzine, lactulose, oxyCODONE-acetaminophen, simethicone, sodium chloride, sodium chloride  Assessment/ Plan:   1.ABLA/Anemia of malignancy and CKD:EPO per Heme/onc  1. s/p second blood transfusion  2. Cont to follow H/H 2 Non-Hodgkin's lympohoma s/p Bendamustine and rituximab 02/03/14  1. Plan per Heme/Onc    3   ESRD: has remained HD-dependent and anuric, presumably due to ischemic ATN without recovery. Will need placement . Cont  with  HD 3 x week. TThSat  4. 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   6. Vascular access- has RIJ PC but will need AVF/AVG once stabilized.    Dispo- currently not a candidate for outpt HD      LOS: [place] @TODAY @10 :04 AM

## 2014-03-16 NOTE — Progress Notes (Signed)
Physical Therapy Treatment Patient Details Name: Douglas [name] MRN: [id number] DOB: 11-Jun-1956 Today's Date: 03/16/2014    History of Present Illness 58 yo M with pmh of follicular low-grade non-Hodgkin's lymphoma with large left groin lymphadenopathy diagnosed in 2009 with recent chemotherapy and admission for AKI 3/17-3/20 who presented as a transfer from [place] with 1.5 week history of shortness of breath found to be volume overloaded with hyperkalemia and acute renal failure requiring emergent HD.      PT Comments    Much improved sitting balance/anterior weight shift in sitting compared to last few sessions; Able to shift weight enough to clear hips from bed during transfer training  Follow Up Recommendations  CIR;Supervision/Assistance - 24 hour     Equipment Recommendations  Rolling walker with 5" wheels    Recommendations for Other Services       Precautions / Restrictions Precautions Precautions: Fall Precaution Comments: 454#. LLE lymphedema, wound VAC Restrictions Weight Bearing Restrictions: No    Mobility  Bed Mobility Overal bed mobility: Needs Assistance Bed Mobility: Supine to Sit;Sit to Supine Rolling: Mod assist;+2 for physical assistance   Supine to sit: +2 for physical assistance;Mod assist Sit to supine: +2 for physical assistance   General bed mobility comments: Cues for using rails to roll and to flex opposite hip to help with rolling; Step-by-step cues to pre-position LEs towardsEOB and shift shoulders in prep for pulling up to sit; acheived better positioning with pre-positioning and getting up on R side of bed  Transfers Overall transfer level: Needs assistance Equipment used: 2 person hand held assist;None ( bil knees heavily blocked; [place]-transfer technique) Transfers: Sit to/from Stand Sit to Stand: +2 physical assistance;Max assist   Squat pivot transfers: +2 physical assistance;Min assist     General transfer comment: attempted  to stand from bed x 2 with elevated surface, momentum strategy and assist of 3, but pt unable to achieve full upright standing. Pt was able assist more and to clear hips from bed minimally this session  Ambulation/Gait                 Stairs            Wheelchair Mobility    Modified Rankin (Stroke Patients Only)       Balance Overall balance assessment: Needs assistance Sitting-balance support: Bilateral upper extremity supported Sitting balance-Leahy Scale: Fair Sitting balance - Comments: UE assist for balance at edge of deflated bed and lower bed rail removed; cues for anterior weight shift; sat about 12 minutes; gains made in ability to hinge at hips; more able to keep shoulders above hips (as opposed to shoulders behind hips with UE propping posteriorly in previous sessions)                            Cognition Arousal/Alertness: Awake/alert Behavior During Therapy: WFL for tasks assessed/performed Overall Cognitive Status: Within Functional Limits for tasks assessed                      Exercises General Exercises - Upper Extremity Shoulder Flexion: Strengthening;Both;10 reps;Theraband Shoulder Extension: Strengthening;Both;Theraband;10 reps Shoulder ABduction: Strengthening;10 reps;Both;Theraband Shoulder ADduction: 10 reps;Strengthening;Both;Theraband Elbow Flexion: Strengthening;10 reps;Both;Theraband Elbow Extension: Strengthening;Both;10 reps;Theraband General Exercises - Lower Extremity Quad Sets: AROM;Left;5 reps Heel Slides: AAROM;Left;5 reps Other Exercises Other Exercises: Attempted to use Maxi-sky for OOB, but it bacame apparent that the lift had less battery power during transfer, so assist pt back to  the bed and positioned Maxi-sky for charging    General Comments        Pertinent Vitals/Pain no apparent distress     Home Living                      Prior Function            PT Goals (current goals can  now be found in the care plan section) Acute Rehab PT Goals Patient Stated Goal: to get moving PT Goal Formulation: With patient Time For Goal Achievement: 03/16/14 Potential to Achieve Goals: Good Progress towards PT goals: Progressing toward goals (very slowly)    Frequency  Min 3X/week    PT Plan Current plan remains appropriate    Co-evaluation PT/OT/SLP Co-Evaluation/Treatment: Yes Reason for Co-Treatment: Complexity of the patient's impairments (multi-system involvement);For patient/therapist safety PT goals addressed during session: Mobility/safety with mobility OT goals addressed during session: ADL's and self-care;Strengthening/ROM     End of Session Equipment Utilized During Treatment: Gait belt (sheet roll as gait belt) Activity Tolerance: Patient tolerated treatment well Patient left: in bed;Other (comment) (with OT performing UE therex)     Time: 2620-3559 PT Time Calculation (min): 37 min  Charges:  $Therapeutic Activity: 8-22 mins                    G Codes:      [facility] [place] 03/16/2014, 4:38 PM [name], [place] Pager [phone] Office [phone]

## 2014-03-16 NOTE — Progress Notes (Signed)
Patient ID: Brysyn [name]  male  FXT:[id number]    DOB: [birth year]    DOA: 03/01/2014  PCP: [name], MD    Brief interim history   Patient is a 58 year old male with history of follicular low-grade non-Hodgkin lymphoma, large left groin lymphadenopathy, diagnosed in 2000 and was on recent chemotherapy, had admission for acute renal failure 3/17- 3/20. He was suspected to have acute renal failure de to HCTZ and ARB.    He had outpatient left-sided inguinal node biopsy after which he has developed a large lymph filled seroma on the left groin requiring wound VAC placement and continuous drainage, he continues to drain about liter liter and a half on a daily basis, there were some superinfection which is responding well to antibiotics which are presently on board. Have discussed his case multiple times with general surgery who do not recommend any further surgical intervention. Once he has a rehabilitation bed he could be placed in a rehabilitation as he requires intermittent IV fluid boluses for ongoing fluid loss due to daily lymph drainage from the wound VAC.  He is currently stable from medicine standpoint for discharge to a monitored setting like rehabilitation/SNF, case management and social worker have been informed on 03/10/2014 for the same. His long-term prognosis although appears not to good due to underlying lymphoma, ESRD, anasarca and baseline low blood pressures.    SIGNIFICANT EVENTS / STUDIES:   4/3 Hyperkalemia, hyperuricemia, hyperphosphatemia, with ARF requiring emergent CRRT. UA with urate crystals. Renal US normal. No pulmonary edema on CXR. Heparin gtt started due to concern for PE. Started IV vanc for left groin wound.  4/4 ARF thought to be due to ATN from severe sepsis, per oncology not tumor lysis syndrome. No DVT, heparin gtt stopped. 2D-echo normal.  4/5 Continue CRRT, to transition to HD, no renal recovery yet. Hyperuricemia resolved. Dialysis access is a  right IJ dialysis catheter 4/6  .Wound vac placed 4/7 -hemodialysis initiated      Assessment/Plan:   Principal Problem   Acute kidney injury-potentially related to tumor lysis now ESRD :   - Etiology prerenal from volume loss from her fistula versus severe sepsis from left groin infection.  Per Oncology tumor lysis syndrome less likely, Currently undergoing dialysis per renal on a 3 times a week basis. Discussed with renal now likely ESRD.     NHL (non-Hodgkin's lymphoma) with follicular low-grade, large left groin lymph adenopathy status post chemotherapy, hyperuricemia  Hyperuricemia has resolved, oncology following (Dr.Magrinat),  stable G6PD, LDH levels are around 600 which is twice normal, transfuse PRBCs as needed.  CT abdomen for restaging outpatient home along with outpatient followup with oncology post discharge.     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  Since his blood pressures are low, got totaled 2 units of packed RBCs one each on 03/11/2014 and 03/12/2014, no signs of ongoing bleed, post transfusion stable H&H.     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 Hulen Skains on 02/24/2014, patient to be discharged with home health and wound VAC, will follow with Dr. Barkley Bruns in the office post discharge.    - Noted wound growing gram-negative rods was placed on doxycycline + cipro on 02/25/2014 stopped on 03/10/14, monitor temperature curve, leukocytosis improved.      Intermittent  tachycardic and hypotension  Likely due to fluid removal during HD, along with ongoing fluid loss from his left inguinal drain , will  supplement with IV fluids as needed, have discussed with surgery, they have no other solution then to continue drainage this time. Per Dr. Barkley Bruns and Hulen Skains   no further surgical intervention is required. Steroids were tapered off. Increased midodrene. Stable random cortisol and  post transfusion H&H.       Type 2 diabetes mellitus  - Continue sliding scale insulin  CBG (last 3)   Recent Labs  03/15/14 1630 03/15/14 2014 03/16/14 0735  GLUCAP 134* 171* 180*       GERD with history of GI bleed: Continue PPI       DVT Prophylaxis: Heparin subcutaneous   Code Status: Full CODE STATUS  Family Communication:  Disposition: Rehabilitation, CIR evaluating   He is currently stable from medicine standpoint for discharge to a monitored setting like rehabilitation/SNF, case management and social worker have been informed on 03/10/2014 for the same    Consultants:  Surgery  Nephrology  Oncology    Procedures:  Dialysis  Wound VAC    Antibiotics:  Anti-infectives   Start     Dose/Rate Route Frequency Ordered Stop   03/07/2014 0953  ceFAZolin (ANCEF) 1-5 GM-% IVPB    Comments:  [name]   : cabinet override      03/10/2014 [id number] 03/06/2014 2159   03/09/2014 0953  ceFAZolin (ANCEF) 2-3 GM-% IVPB SOLR    Comments:  [name]   : cabinet override      02/21/2014 0953 03/15/2014 2159   02/19/2014 0600  [MAR Hold]  ceFAZolin (ANCEF) 3 g in dextrose 5 % 50 mL IVPB     (On MAR Hold since 03/05/2014 0850)   3 g 160 mL/hr over 30 Minutes Intravenous On call 03/07/14 1556 03/15/2014 1021   03/11/2014 0000  ceFAZolin (ANCEF) IVPB 1 g/50 mL premix  Status:  Discontinued    Comments:  Send with pt to OR   1 g 100 mL/hr over 30 Minutes Intravenous On call 03/07/14 1540 03/07/14 1555   03/01/14 1800  doxycycline (VIBRA-TABS) tablet 100 mg  Status:  Discontinued     100 mg Oral Every 12 hours 03/01/14 1522 03/10/14 1119   03/01/14 0800  ciprofloxacin (CIPRO) tablet 500 mg  Status:  Discontinued     500 mg Oral Daily with breakfast 02/28/14 1106 03/10/14 1119   02/26/14 0000  doxycycline (VIBRAMYCIN) 100 MG capsule     100 mg Oral 2 times daily 02/26/14 1153     02/26/14 0000  ciprofloxacin (CIPRO) 500 MG tablet     500 mg Oral 2 times daily 02/26/14  1153     02/25/14 1100  ciprofloxacin (CIPRO) IVPB 400 mg  Status:  Discontinued     400 mg 200 mL/hr over 60 Minutes Intravenous Every 12 hours 02/25/14 1047 02/28/14 1106   02/24/14 1830  doxycycline (VIBRAMYCIN) 100 mg in dextrose 5 % 250 mL IVPB  Status:  Discontinued     100 mg 125 mL/hr over 120 Minutes Intravenous Every 12 hours 02/24/14 1756 03/01/14 1521   02/24/14 1800  cefTRIAXone (ROCEPHIN) 1 g in dextrose 5 % 50 mL IVPB  Status:  Discontinued     1 g 100 mL/hr over 30 Minutes Intravenous Every 24 hours 02/24/14 1231 02/24/14 1756   02/24/14 1200  vancomycin (VANCOCIN) 1,500 mg in sodium chloride 0.9 % 250 mL IVPB  Status:  Discontinued     1,500 mg 250 mL/hr over 60 Minutes Intravenous  Once 02/23/14 1423 02/24/14 1231   02/20/14 2200  vancomycin (VANCOCIN) 1,500 mg in sodium chloride 0.9 % 500 mL IVPB  Status:  Discontinued     1,500 mg 250 mL/hr over 120 Minutes Intravenous Every 24 hours 03/14/2014 2119 02/22/14 1116   03/02/2014 2200  vancomycin (VANCOCIN) 2,000 mg in sodium chloride 0.9 % 500 mL IVPB     2,000 mg 250 mL/hr over 120 Minutes Intravenous  Once 03/03/2014 2119 02/20/14 0005     Subjective:  Patient in bed, no fever chills, no headache chest abdominal pain, no shortness of breath, generalized weakness.  Objective: Weight change: 0 kg (0 lb)  Intake/Output Summary (Last 24 hours) at 03/16/14 1224 Last data filed at 03/16/14 0616  Gross per 24 hour  Intake    360 ml  Output    100 ml  Net    260 ml   Blood pressure 95/57, pulse 107, temperature 98.6 F (37 C), temperature source Oral, resp. rate 19, height 5' 10.08" (1.78 m), weight 218.2 kg (481 lb 0.7 oz), SpO2 94.00%.  Physical Exam: General: Alert and awake, oriented x3, not in any acute distress. CVS: S1-S2 clear, no murmur rubs or gallops Chest: clear to auscultation bilaterally, no wheezing, rales or rhonchi Abdomen: soft  nondistended, normal bowel sounds  Extremities: no cyanosis, clubbing. 3+  edema noted bilaterally, left groin has a large fluctuant fluid collection with wound VAC drain in place   Lab Results: Basic Metabolic Panel:  Recent Labs Lab 03/14/14 0540 03/15/14 1030 03/16/14 1005  NA 132* 137  --   K 5.3 2.3* 4.3  CL 88* 92*  --   CO2 22 28  --   GLUCOSE 129* 94  --   BUN 48* 5*  --   CREATININE 4.78* 0.94  --   CALCIUM 9.0 8.0*  --   PHOS 6.0* 0.9*  --    Liver Function Tests:  Recent Labs Lab 03/14/14 0540 03/15/14 1030  ALBUMIN 1.7* 1.9*   No results found for this basename: LIPASE, AMYLASE,  in the last 168 hours No results found for this basename: AMMONIA,  in the last 168 hours CBC:  Recent Labs Lab 03/16/14 0615 03/16/14 1005  WBC 10.1 9.3  NEUTROABS QUESTIONABLE RESULTS, RECOMMEND RECOLLECT TO VERIFY  --   HGB 9.2* 9.2*  HCT 28.7* 29.0*  MCV 87.8 90.6  PLT 114* 117*   Cardiac Enzymes: No results found for this basename: CKTOTAL, CKMB, CKMBINDEX, TROPONINI,  in the last 168 hours BNP: No components found with this basename: POCBNP,  CBG:  Recent Labs Lab 03/14/14 2211 03/15/14 1215 03/15/14 1630 03/15/14 2014 03/16/14 0735  GLUCAP 121* 123* 134* 171* 180*     Micro Results: No results found for this or any previous visit (from the past 240 hour(s)).  Studies/Results: Dg Chest 1 View  02/03/2014   CLINICAL DATA:  Central catheter placement  EXAM: CHEST - 1 VIEW  COMPARISON:  Chest CT December 02, 2013  FINDINGS: Central catheter tip is in the right axillary vein region. There is again noted a 1.7 x 1.3 cm nodular opacity in the right midlung. There is no edema or consolidation. Heart is mildly enlarged with normal pulmonary vascularity. No apparent adenopathy. No pneumothorax. No bone lesions.  IMPRESSION: Central catheter tip in right axillary vein. Nodular opacity right mid lung. No edema or consolidation.   Electronically Signed   By: Lowella Grip M.D.   On: 02/03/2014 07:06   Dg Chest 2 View  02/16/2014    CLINICAL DATA:  2  week history shortness of breath ; the patient has a history of non-Hodgkin's lymphoma  EXAM: CHEST  2 VIEW  COMPARISON:  DG CHEST 1 VIEW dated 02/03/2014  FINDINGS: The lungs are adequately inflated. There is an abnormal soft tissue density nodule that projects in the lower 1/3 of the right hemithorax on the frontal view. It measures 1.4 x 1.9 cm in diameter. It may lie anteriorly in the right middle lobe. This is similar in appearance to that demonstrated on the previous study. Elsewhere no definite pulmonary nodules are demonstrated. There is no alveolar infiltrate. The cardiac silhouette is normal in size. The pulmonary vascularity is not engorged. There is mild degenerative change of the thoracic spine with calcification of the anterior longitudinal ligament.  IMPRESSION: 1. There is persistent nodularity likely in the right middle lobe. There is no definite evidence of pneumonia. 2. There is no evidence of CHF. 3. Further evaluation of the thorax with chest CT scanning is recommended. This would allow further evaluation of the mediastinum and hilar regions as well as the pulmonary parenchyma given the patient's symptoms of shortness of breath.   Electronically Signed   By: David  Martinique   On: 02/16/2014 10:56   US Renal  March 08, 2014   CLINICAL DATA:  Evaluate for hydronephrosis  EXAM: RENAL/URINARY TRACT ULTRASOUND COMPLETE  COMPARISON:  US RENAL dated 02/03/2014; [place] ART/VEN FLOW ABD PELV DOPPLER dated 11/27/2013; US SCROTUM dated 11/27/2013  FINDINGS: Right Kidney:  Length: 11.5 cm. Echogenicity within normal limits. No mass or hydronephrosis visualized.  Left Kidney:  Length: 12.2 cm. Echogenicity within normal limits. No mass or hydronephrosis visualized.  Bladder:  Urinary bladder is decompressed, patient is status post Foley catheter insertion.  IMPRESSION: Negative renal ultrasound.   Electronically Signed   By: Margaree Mackintosh M.D.   On: 08-Mar-2014 16:09   US Renal  02/03/2014   CLINICAL  DATA:  Acute kidney injury.  EXAM: RENAL/URINARY TRACT ULTRASOUND COMPLETE  COMPARISON:  CT ABD/PELVIS W CM dated 10/30/2013  FINDINGS: Right Kidney:  Length: 11.6 cm. Echogenicity within normal limits. No mass or hydronephrosis visualized.  Left Kidney:  Length: 11.8 cm. Echogenicity within normal limits. No mass or hydronephrosis visualized.  Bladder:  Appears normal for degree of bladder distention.  IMPRESSION: Unremarkable study.   Electronically Signed   By: Rolm Baptise M.D.   On: 02/03/2014 16:56   Dg Chest Port 1 View  02/23/2014   CLINICAL DATA:  Respiratory distress.  EXAM: PORTABLE CHEST - 1 VIEW  COMPARISON:  DG CHEST 1V PORT dated 02/20/2014; CT CHEST W/CM dated 12/02/2013; DG CHEST 1V PORT dated 03/03/2014; DG CHEST 1V PORT dated 03/07/2014  FINDINGS: Cardiomediastinal silhouette remains stable and normal. Dialysis catheter tips SVC RA junction, unchanged. No focal infiltrates, although right lung nodular densities persist. Negative osseous structures.  IMPRESSION: No active infiltrates.   Electronically Signed   By: Rolla Flatten M.D.   On: 02/23/2014 08:10   Dg Chest Port 1 View  02/20/2014   CLINICAL DATA:  Shortness of breath  EXAM: PORTABLE CHEST - 1 VIEW  COMPARISON:  02/28/2014  FINDINGS: A temporary dialysis catheter is again noted on the right. The cardiac shadow is stable. The lungs are well aerated bilaterally without focal infiltrate or sizable effusion.  IMPRESSION: No acute abnormality noted.   Electronically Signed   By: Inez Catalina M.D.   On: 02/20/2014 08:08   Dg Chest Port 1 View  03/06/2014   CLINICAL DATA:  Status post central  line placement  EXAM: PORTABLE CHEST - 1 VIEW  COMPARISON:  02/22/2014 1251 hrs  FINDINGS: The cardiac shadow is stable. A temporary dialysis catheter is now seen in the distal superior vena cava. No pneumothorax is noted. Mild atelectatic changes are noted in the right base. This may be related to the poor inspiratory effort.  IMPRESSION: Status post  temporary dialysis catheter placement without pneumothorax.   Electronically Signed   By: Inez Catalina M.D.   On: 03/01/2014 19:31   Dg Chest Portable 1 View  03/05/2014   CLINICAL DATA:  Shortness of breath. Non-Hodgkin's lymphoma. Diabetes and hypertension.  EXAM: PORTABLE CHEST - 1 VIEW  COMPARISON:  DG CHEST 2 VIEW dated 02/16/2014; DG CHEST 1 VIEW dated 02/03/2014; CT CHEST W/CM dated 12/02/2013  FINDINGS: The heart is mildly enlarged. Right middle lobe nodule appears larger, now measuring 2.7 cm. Probable right upper lobe nodule is only partially seen, measuring approximately 1 cm in diameter radiographically. Lesion was larger based on prior CT evaluation. There is slight prominence the left hilar region but this is felt to be stable compared with prior radiographs and CT. No new consolidation or pleural effusion.  IMPRESSION: Persistent right lung nodules. Right middle lobe nodule is larger ring compared prior studies.   Electronically Signed   By: Shon Hale M.D.   On: 03/12/2014 13:08    Medications: Scheduled Meds: . bisacodyl  10 mg Rectal Daily  . docusate sodium  200 mg Oral BID  . feeding supplement (NEPRO CARB STEADY)  237 mL Oral BID BM  . heparin subcutaneous  5,000 Units Subcutaneous 3 times per day  . insulin aspart  0-9 Units Subcutaneous TID WC  . midodrine  10 mg Oral TID WC  . multivitamin  1 tablet Oral QHS  . pantoprazole  40 mg Oral Q1200  . polyethylene glycol  17 g Oral Daily  . sodium bicarbonate  1,300 mg Oral TID  . sodium chloride  3 mL Intravenous Q12H      LOS: 25 days   Thurnell Lose M.D. [facility] 03/16/2014, 12:24 PM Pager: [phone]  If 7PM-7AM, please contact night-coverage [email or web address] Password [email or web address]

## 2014-03-17 LAB — CBC WITH DIFFERENTIAL/PLATELET
BASOS ABS: 0 10*3/uL (ref 0.0–0.1)
Basophils Relative: 0 % (ref 0–1)
EOS PCT: 1 % (ref 0–5)
Eosinophils Absolute: 0.1 10*3/uL (ref 0.0–0.7)
HEMATOCRIT: 26.9 % — AB (ref 39.0–52.0)
Hemoglobin: 8.5 g/dL — ABNORMAL LOW (ref 13.0–17.0)
LYMPHS ABS: 1.4 10*3/uL (ref 0.7–4.0)
Lymphocytes Relative: 14 % (ref 12–46)
MCH: 28.2 pg (ref 26.0–34.0)
MCHC: 31.6 g/dL (ref 30.0–36.0)
MCV: 89.4 fL (ref 78.0–100.0)
MONOS PCT: 14 % — AB (ref 3–12)
Monocytes Absolute: 1.4 10*3/uL — ABNORMAL HIGH (ref 0.1–1.0)
NEUTROS ABS: 7.1 10*3/uL (ref 1.7–7.7)
Neutrophils Relative %: 71 % (ref 43–77)
Platelets: 110 10*3/uL — ABNORMAL LOW (ref 150–400)
RBC: 3.01 MIL/uL — ABNORMAL LOW (ref 4.22–5.81)
RDW: 19.2 % — AB (ref 11.5–15.5)
WBC: 10 10*3/uL (ref 4.0–10.5)

## 2014-03-17 LAB — RENAL FUNCTION PANEL
Albumin: 1.6 g/dL — ABNORMAL LOW (ref 3.5–5.2)
BUN: 49 mg/dL — ABNORMAL HIGH (ref 6–23)
CALCIUM: 9.1 mg/dL (ref 8.4–10.5)
CO2: 21 meq/L (ref 19–32)
Chloride: 89 mEq/L — ABNORMAL LOW (ref 96–112)
Creatinine, Ser: 4.94 mg/dL — ABNORMAL HIGH (ref 0.50–1.35)
GFR calc Af Amer: 14 mL/min — ABNORMAL LOW (ref >90)
GFR calc non Af Amer: 12 mL/min — ABNORMAL LOW (ref >90)
GLUCOSE: 138 mg/dL — AB (ref 70–99)
Phosphorus: 5.7 mg/dL — ABNORMAL HIGH (ref 2.3–4.6)
Potassium: 4.4 mEq/L (ref 3.7–5.3)
Sodium: 133 mEq/L — ABNORMAL LOW (ref 137–147)

## 2014-03-17 LAB — GLUCOSE, CAPILLARY
GLUCOSE-CAPILLARY: 167 mg/dL — AB (ref 70–99)
Glucose-Capillary: 129 mg/dL — ABNORMAL HIGH (ref 70–99)
Glucose-Capillary: 178 mg/dL — ABNORMAL HIGH (ref 70–99)

## 2014-03-17 MED ORDER — SODIUM CHLORIDE 0.9 % IV SOLN
100.0000 mL | INTRAVENOUS | Status: DC | PRN
Start: 2014-03-17 — End: 2014-03-17

## 2014-03-17 MED ORDER — PENTAFLUOROPROP-TETRAFLUOROETH EX AERO: 1.0000 "application " | INHALATION_SPRAY | CUTANEOUS | Status: DC | PRN

## 2014-03-17 MED ORDER — HEPARIN SODIUM (PORCINE) 1000 UNIT/ML DIALYSIS
1000.0000 [IU] | INTRAMUSCULAR | Status: DC | PRN
  Filled 2014-03-17: qty 1

## 2014-03-17 MED ORDER — SODIUM CHLORIDE 0.9 % IV SOLN: 100.0000 mL | INTRAVENOUS | Status: DC | PRN

## 2014-03-17 MED ORDER — NEPRO/CARBSTEADY PO LIQD: 237.0000 mL | ORAL | Status: DC | PRN

## 2014-03-17 MED ORDER — OXYCODONE-ACETAMINOPHEN 5-325 MG PO TABS
1.0000 | ORAL_TABLET | Freq: Once | ORAL | Status: AC
  Administered 2014-03-17: 1 via ORAL
  Filled 2014-03-17: qty 2

## 2014-03-17 MED ORDER — LIDOCAINE-PRILOCAINE 2.5-2.5 % EX CREA: 1.0000 "application " | TOPICAL_CREAM | CUTANEOUS | Status: DC | PRN

## 2014-03-17 MED ORDER — ALTEPLASE 2 MG IJ SOLR
2.0000 mg | Freq: Once | INTRAMUSCULAR | Status: DC | PRN
  Filled 2014-03-17: qty 2

## 2014-03-17 MED ORDER — LIDOCAINE HCL (PF) 1 % IJ SOLN: 5.0000 mL | INTRAMUSCULAR | Status: DC | PRN

## 2014-03-17 NOTE — Progress Notes (Signed)
PT Cancellation Note  Patient Details Name: Douglas [name] MRN: [id number] DOB: 12/08/1955   Cancelled Treatment:    Reason Eval/Treat Not Completed: Other (comment) Politely declining post HD   [facility] [name] 03/17/2014, 2:26 PM [name], [place] Pager [phone] Office [phone]

## 2014-03-17 NOTE — Progress Notes (Addendum)
[facility] PROGRESS NOTE  Douglas [name] KNL:[id number] DOB: 1956/09/17 DOA: 02/24/2014 PCP: [name], MD  Assessment/Plan: Patient is a 58 year old male with history of follicular low-grade non-Hodgkin lymphoma, large left groin lymphadenopathy, diagnosed in 2000 and was on recent chemotherapy, had admission for acute renal failure 3/17- 3/20. He was suspected to have acute renal failure de to HCTZ and ARB.  He had outpatient left-sided inguinal node biopsy after which he has developed a large lymph filled seroma on the left groin requiring wound VAC placement and continuous drainage, he continues to drain about liter liter and a half on a daily basis, there were some superinfection which is responding well to antibiotics which are presently on board. Have discussed his case multiple times with general surgery who do not recommend any further surgical intervention. Once he has a rehabilitation bed he could be placed in a rehabilitation as he requires intermittent IV fluid boluses for ongoing fluid loss due to daily lymph drainage from the wound VAC.   He is currently stable from medicine standpoint for discharge to a monitored setting like rehabilitation/SNF, case management and social worker have been informed on 03/10/2014 for the same. His long-term prognosis although appears not to good due to underlying lymphoma, ESRD, anasarca and baseline low blood pressures.   SIGNIFICANT EVENTS / STUDIES:  4/3 Hyperkalemia, hyperuricemia, hyperphosphatemia, with ARF requiring emergent CRRT. UA with urate crystals. Renal US normal. No pulmonary edema on CXR. Heparin gtt started due to concern for PE. Started IV vanc for left groin wound.  4/4 ARF thought to be due to ATN from severe sepsis, per oncology not tumor lysis syndrome. No DVT, heparin gtt stopped. 2D-echo normal.  4/5 Continue CRRT, to transition to HD, no renal recovery yet. Hyperuricemia resolved. Dialysis access is a right IJ  dialysis catheter  4/6 .Wound vac placed  4/7 -hemodialysis initiated   Assessment/Plan:  1. Acute kidney injury-potentially related to tumor lysis now ESRD :  - Etiology prerenal from volume loss from her fistula versus severe sepsis from left groin infection. Per Oncology tumor lysis syndrome less likely, Currently undergoing dialysis per renal on a 3 times a week basis. Discussed with renal now likely ESRD.  2. NHL (non-Hodgkin's lymphoma) with follicular low-grade, large left groin lymph adenopathy status post chemotherapy, hyperuricemia  Hyperuricemia has resolved, oncology following (Dr.Magrinat), stable G6PD, LDH levels are around 600 which is twice normal, transfuse PRBCs as needed. CT abdomen for restaging outpatient home along with outpatient followup with oncology post discharge.  3. 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  Since his blood pressures are low, got totaled 2 units of packed RBCs one each on 03/11/2014 and 03/12/2014, no signs of ongoing bleed, post transfusion stable H&H.  4. 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 Hulen Skains on 02/24/2014, patient to be discharged with home health and wound VAC, will follow with Dr. Barkley Bruns in the office post discharge.  - Noted wound growing gram-negative rods was placed on doxycycline + cipro on 02/25/2014 stopped on 03/10/14, monitor temperature curve, leukocytosis improved.  5. Intermittent tachycardic and hypotension  Likely due to fluid removal during HD, along with ongoing fluid loss from his left inguinal drain , will supplement with IV fluids as needed, have discussed with surgery, they have no other solution then to continue drainage this time. Per Dr. Barkley Bruns and Hulen Skains no further surgical intervention is required. Steroids were tapered off. Increased midodrene. Stable  random cortisol and post transfusion H&H.  6. Type 2 diabetes mellitus  - Continue  sliding scale insulin  7. GERD with history of GI bleed: Continue PPI     Code Status: full Family Communication:  D/w patient, called updated [name],[name] [phone] (indicate person spoken with, relationship, and if by phone, the number) Disposition Plan: CIR vs LTEC  Consultants:  Surgery  Nephrology  Oncology Procedures:  Dialysis  Wound VAC Antibiotics:  Anti-infectives    Start    Dose/Rate  Route  Frequency  Ordered  Stop    03/17/2014 0953   ceFAZolin (ANCEF) 1-5 GM-% IVPB     03/03/2014 0953  02/27/2014 2159      Comments: [name] : cabinet override         03/07/2014 0953   ceFAZolin (ANCEF) 2-3 GM-% IVPB SOLR     03/18/2014 0953  02/17/2014 2159      Comments: [name] : cabinet override         03/04/2014 0600   [MAR Hold] ceFAZolin (ANCEF) 3 g in dextrose 5 % 50 mL IVPB (On MAR Hold since 03/07/2014 0850)  3 g  160 mL/hr over 30 Minutes  Intravenous  On call  03/07/14 1556  03/07/2014 1021    02/23/2014 0000   ceFAZolin (ANCEF) IVPB 1 g/50 mL premix Status: Discontinued  1 g  100 mL/hr over 30 Minutes  Intravenous  On call  03/07/14 1540  03/07/14 1555      Comments: Send with pt to OR         03/01/14 1800   doxycycline (VIBRA-TABS) tablet 100 mg Status: Discontinued  100 mg  Oral  Every 12 hours  03/01/14 1522  03/10/14 1119    03/01/14 0800   ciprofloxacin (CIPRO) tablet 500 mg Status: Discontinued  500 mg  Oral  Daily with breakfast  02/28/14 1106  03/10/14 1119    02/26/14 0000   doxycycline (VIBRAMYCIN) 100 MG capsule  100 mg  Oral  2 times daily  02/26/14 1153     02/26/14 0000   ciprofloxacin (CIPRO) 500 MG tablet  500 mg  Oral  2 times daily  02/26/14 1153     02/25/14 1100   ciprofloxacin (CIPRO) IVPB 400 mg Status: Discontinued  400 mg  200 mL/hr over 60 Minutes  Intravenous  Every 12 hours  02/25/14 1047  02/28/14 1106    02/24/14 1830   doxycycline (VIBRAMYCIN) 100 mg in dextrose 5 % 250 mL IVPB Status: Discontinued  100 mg  125 mL/hr over 120 Minutes   Intravenous  Every 12 hours  02/24/14 1756  03/01/14 1521    02/24/14 1800   cefTRIAXone (ROCEPHIN) 1 g in dextrose 5 % 50 mL IVPB Status: Discontinued  1 g  100 mL/hr over 30 Minutes  Intravenous  Every 24 hours  02/24/14 1231  02/24/14 1756    02/24/14 1200   vancomycin (VANCOCIN) 1,500 mg in sodium chloride 0.9 % 250 mL IVPB Status: Discontinued  1,500 mg  250 mL/hr over 60 Minutes  Intravenous  Once  02/23/14 1423  02/24/14 1231    02/20/14 2200   vancomycin (VANCOCIN) 1,500 mg in sodium chloride 0.9 % 500 mL IVPB Status: Discontinued  1,500 mg  250 mL/hr over 120 Minutes  Intravenous  Every 24 hours  02/27/2014 2119  02/22/14 1116    02/18/2014 2200   vancomycin (VANCOCIN) 2,000 mg in sodium chloride 0.9 % 500 mL IVPB  2,000 mg  250  mL/hr over 120 Minutes  Intravenous  Once  02/24/14 2119  02/20/14 0005       HPI/Subjective: alert  Objective: Filed Vitals:   03/17/14 1315  BP: 103/39  Pulse: 117  Temp: 99 F (37.2 C)  Resp: 19    Intake/Output Summary (Last 24 hours) at 03/17/14 1349 Last data filed at 03/17/14 1009  Gross per 24 hour  Intake    120 ml  Output    715 ml  Net   -595 ml   Filed Weights   03/15/14 1112 03/17/14 0707 03/17/14 1009  Weight: 218.2 kg (481 lb 0.7 oz) 219 kg (482 lb 12.9 oz) 219 kg (482 lb 12.9 oz)    Exam:   General:  alert  Cardiovascular: s1,s2 rrr  Respiratory: CTA BL  Abdomen: soft, obese, NT  Musculoskeletal: LE edema   Data Reviewed: Basic Metabolic Panel:  Recent Labs Lab 03/11/14 0544 03/12/14 1238 03/14/14 0540 03/15/14 1030 03/16/14 1005 03/17/14 0500  NA 133* 136* 132* 137  --  133*  K 4.7 2.2* 5.3 2.3* 4.3 4.4  CL 87* 92* 88* 92*  --  89*  CO2 20 27 22 28   --  21  GLUCOSE 137* 101* 129* 94  --  138*  BUN 44* 6 48* 5*  --  49*  CREATININE 4.30* 1.01 4.78* 0.94  --  4.94*  CALCIUM 8.8 7.8* 9.0 8.0*  --  9.1  PHOS 5.6* 1.1* 6.0* 0.9*  --  5.7*   Liver Function Tests:  Recent Labs Lab 03/11/14 0544  03/12/14 1238 03/14/14 0540 03/15/14 1030 03/17/14 0500  ALBUMIN 1.9* 2.1* 1.7* 1.9* 1.6*   No results found for this basename: LIPASE, AMYLASE,  in the last 168 hours No results found for this basename: AMMONIA,  in the last 168 hours CBC:  Recent Labs Lab 03/11/14 0544 03/12/14 1204 03/14/14 0540 03/15/14 0500 03/16/14 0615 03/16/14 1005 03/17/14 0500  WBC 9.5 9.6 8.0 9.2 10.1 9.3 10.0  NEUTROABS 6.9 7.0 5.6  --  QUESTIONABLE RESULTS, RECOMMEND RECOLLECT TO VERIFY  --  7.1  HGB 7.0* 7.9* 8.8* 9.7* 9.2* 9.2* 8.5*  HCT 21.3* 23.4* 27.0* 30.0* 28.7* 29.0* 26.9*  MCV 85.5 83.9 87.9 87.7 87.8 90.6 89.4  PLT 93* 94* 87* 107* 114* 117* 110*   Cardiac Enzymes: No results found for this basename: CKTOTAL, CKMB, CKMBINDEX, TROPONINI,  in the last 168 hours BNP (last 3 results)  Recent Labs  2014/02/24 1220  PROBNP 937.1*   CBG:  Recent Labs Lab 03/16/14 0735 03/16/14 1220 03/16/14 1729 03/16/14 2121 03/17/14 1148  GLUCAP 180* 193* 194* 141* 129*    Recent Results (from the past 240 hour(s))  WOUND CULTURE     Status: None   Collection Time    03/16/14 10:12 AM      Result Value Ref Range Status   Specimen Description WOUND LEG LEFT   Final   Special Requests NONE   Final   Gram Stain     Final   Value: RARE WBC PRESENT, PREDOMINANTLY PMN     NO SQUAMOUS EPITHELIAL CELLS SEEN     ABUNDANT GRAM NEGATIVE RODS     MODERATE GRAM POSITIVE COCCI     IN PAIRS   Culture     Final   Value: Culture reincubated for better growth     Performed at [facility]   Report Status PENDING   Incomplete     Studies: No results found.  Scheduled Meds: .  bisacodyl  10 mg Rectal Daily  . docusate sodium  200 mg Oral BID  . feeding supplement (NEPRO CARB STEADY)  237 mL Oral BID BM  . heparin subcutaneous  5,000 Units Subcutaneous 3 times per day  . insulin aspart  0-9 Units Subcutaneous TID WC  . midodrine  10 mg Oral TID WC  . multivitamin  1 tablet Oral QHS  .  pantoprazole  40 mg Oral Q1200  . polyethylene glycol  17 g Oral Daily  . sodium bicarbonate  1,300 mg Oral TID  . sodium chloride  3 mL Intravenous Q12H   Continuous Infusions: . sodium chloride Stopped (03/15/14 0700)    Active Problems:   NHL (non-Hodgkin's lymphoma)   HTN (hypertension)   Dyslipidemia   Anemia of chronic disease   Morbid obesity   Lower GI bleed   Diabetes mellitus   Tumor lysis syndrome, possibility of   Uremia   SOB (shortness of breath) DDX = CHF vs. PE   Acidosis    Time spent: >35 minutes     [name]  [facility] Pager [phone]. If 7PM-7AM, please contact night-coverage at [email or web address], password [facility] 03/17/2014, 1:49 PM  LOS: 26 days

## 2014-03-17 NOTE — Procedures (Signed)
I have seen and examined this patient and agree with the plan of care. Patient was seen on dialysis Sherril Croon 03/17/2014, 10:26 AM

## 2014-03-17 NOTE — Progress Notes (Signed)
Pt will place mask on when ready for bed. Pt encouraged to call RT if needing any assistance with mask or machine. No distress noted.

## 2014-03-18 LAB — CBC WITH DIFFERENTIAL/PLATELET
BASOS PCT: 0 % (ref 0–1)
Basophils Absolute: 0 10*3/uL (ref 0.0–0.1)
EOS PCT: 0 % (ref 0–5)
Eosinophils Absolute: 0 10*3/uL (ref 0.0–0.7)
HCT: 26.7 % — ABNORMAL LOW (ref 39.0–52.0)
HEMOGLOBIN: 8.5 g/dL — AB (ref 13.0–17.0)
LYMPHS ABS: 1.6 10*3/uL (ref 0.7–4.0)
Lymphocytes Relative: 13 % (ref 12–46)
MCH: 28.1 pg (ref 26.0–34.0)
MCHC: 31.8 g/dL (ref 30.0–36.0)
MCV: 88.1 fL (ref 78.0–100.0)
MONO ABS: 1.6 10*3/uL — AB (ref 0.1–1.0)
Monocytes Relative: 13 % — ABNORMAL HIGH (ref 3–12)
Neutro Abs: 9 10*3/uL — ABNORMAL HIGH (ref 1.7–7.7)
Neutrophils Relative %: 74 % (ref 43–77)
Platelets: 130 10*3/uL — ABNORMAL LOW (ref 150–400)
RBC: 3.03 MIL/uL — AB (ref 4.22–5.81)
RDW: 19.4 % — ABNORMAL HIGH (ref 11.5–15.5)
Smear Review: DECREASED
WBC: 12.2 10*3/uL — ABNORMAL HIGH (ref 4.0–10.5)

## 2014-03-18 LAB — RENAL FUNCTION PANEL
Albumin: 1.8 g/dL — ABNORMAL LOW (ref 3.5–5.2)
BUN: 41 mg/dL — AB (ref 6–23)
CO2: 20 mEq/L (ref 19–32)
Calcium: 9.1 mg/dL (ref 8.4–10.5)
Chloride: 86 mEq/L — ABNORMAL LOW (ref 96–112)
Creatinine, Ser: 4.18 mg/dL — ABNORMAL HIGH (ref 0.50–1.35)
GFR calc non Af Amer: 15 mL/min — ABNORMAL LOW (ref >90)
GFR, EST AFRICAN AMERICAN: 17 mL/min — AB (ref >90)
GLUCOSE: 139 mg/dL — AB (ref 70–99)
POTASSIUM: 4.2 meq/L (ref 3.7–5.3)
Phosphorus: 5.4 mg/dL — ABNORMAL HIGH (ref 2.3–4.6)
Sodium: 132 mEq/L — ABNORMAL LOW (ref 137–147)

## 2014-03-18 LAB — GLUCOSE, CAPILLARY
GLUCOSE-CAPILLARY: 116 mg/dL — AB (ref 70–99)
Glucose-Capillary: 145 mg/dL — ABNORMAL HIGH (ref 70–99)
Glucose-Capillary: 123 mg/dL — ABNORMAL HIGH (ref 70–99)
Glucose-Capillary: 125 mg/dL — ABNORMAL HIGH (ref 70–99)

## 2014-03-18 NOTE — Progress Notes (Signed)
Occupational Therapy Treatment Patient Details Name: Douglas [name] MRN: [id number] DOB: 01-Jan-1956 Today's Date: 03/18/2014    History of present illness 58 yo M with pmh of follicular low-grade non-Hodgkin's lymphoma with large left groin lymphadenopathy diagnosed in 2009 with recent chemotherapy and admission for AKI 3/17-3/20 who presented as a transfer from [place] with 1.5 week history of shortness of breath found to be volume overloaded with hyperkalemia and acute renal failure requiring emergent HD.     OT comments  Pt works very hard in therapy.  Attempting to get pt back on his feet again.  Feel once he is on his feet he will be able to take some steps and become mobile.  Slow progress being made toward adls and adl mobility goals.  Follow Up Recommendations  CIR    Equipment Recommendations  None recommended by OT;Other (comment)    Recommendations for Other Services      Precautions / Restrictions Precautions Precautions: Fall Precaution Comments: 454#. LLE lymphedema, wound VAC Restrictions Weight Bearing Restrictions: No       Mobility Bed Mobility Overal bed mobility: Needs Assistance Bed Mobility: Supine to Sit;Sit to Supine     Supine to sit: Max assist;+2 for physical assistance;+2 for safety/equipment Sit to supine: Total assist;+2 for physical assistance   General bed mobility comments: Cues for using rails to roll and to flex opposite hip to help with rolling; Step-by-step cues to pre-position LEs towardsEOB and shift shoulders in prep for pulling up to sit; acheived better positioning with pre-positioning and getting up on R side of bed  Transfers Overall transfer level: Needs assistance Equipment used: 2 person hand held assist;None Transfers: Sit to/from Stand Sit to Stand: Total assist;+2 physical assistance (unable to get into full standing position.)         General transfer comment: Pt has great difficulty getting L foot under his  knee to assist with transfers.  pt unable to stand putting all weight through his R leg.      Balance Overall balance assessment: Needs assistance Sitting-balance support: Feet supported;Bilateral upper extremity supported Sitting balance-Leahy Scale: Poor Sitting balance - Comments: Pt has difficulty sitting on EOB for too long w/o losing balance.  pt can sit statically for appx 1-2 minutes before fatiguing and having to support self w arms or hold to bed rails to stay in sitting. Postural control: Posterior lean     Standing balance comment: unable to get pt into standing.  Attempted with 2 person assist and sheet with bariatric techniques to assist pt from sit to stand.  See PT not for full details.  Pt unable to get L foot under his knee to be able to bear weight through this leg to assist in standing.                   ADL Overall ADL's : Needs assistance/impaired Eating/Feeding: Set up;Sitting                   Lower Body Dressing: Total assistance       Toileting- Clothing Manipulation and Hygiene: Bed level;Total assistance       Functional mobility during ADLs: Total assistance General ADL Comments: Attempted working with pt at EOB today in preparation for more adls on the side of the bed.  Pt has difficulty getting his weight forward and sitting in upright position.  Pt feels at times that he is slipping off the bed although he is not.  Explained to  the pt that sitting upright actually pushes his hips into a posterior tilt which helps keep him on the side of the bed and he will tend to slip off he he sits back in an anteior pelvic tilt.  Need to progress with pt sitting on EOB longer to do adls.      Vision                     Perception     Praxis      Cognition   Behavior During Therapy: [facility] for tasks assessed/performed Overall Cognitive Status: Within Functional Limits for tasks assessed                       Extremity/Trunk  Assessment               Exercises     Shoulder Instructions       General Comments      Pertinent Vitals/ Pain       Pt w c/o pain in L leg during therapy.  Pt did not rate pain.  Nursing aware.  Home Living                                          Prior Functioning/Environment              Frequency Min 2X/week     Progress Toward Goals  OT Goals(current goals can now be found in the care plan section)  Progress towards OT goals: Progressing toward goals  Acute Rehab OT Goals Patient Stated Goal: to get moving OT Goal Formulation: With patient Time For Goal Achievement: 03/09/14 Potential to Achieve Goals: Good ADL Goals Pt Will Perform Grooming: with min assist;with min guard assist;sitting Pt Will Perform Upper Body Bathing: with min guard assist;sitting Pt Will Perform Lower Body Bathing: with max assist;with mod assist;sitting/lateral leans Pt Will Perform Upper Body Dressing: with min guard assist;sitting Pt Will Transfer to Toilet: with max assist;bedside commode Additional ADL Goal #1: Pt will participate in  UE strengtheinig exercises while seated to increase ADL and functional tasks independence  Plan Discharge plan remains appropriate    Co-evaluation    PT/OT/SLP Co-Evaluation/Treatment: Yes Reason for Co-Treatment: Complexity of the patient's impairments (multi-system involvement);For patient/therapist safety   OT goals addressed during session: ADL's and self-care      End of Session Equipment Utilized During Treatment: Gait belt;Rolling walker   Activity Tolerance Patient limited by fatigue   Patient Left with call bell/phone within reach;in bed   Nurse Communication Mobility status        Time: 3762-8315 OT Time Calculation (min): 43 min  Charges: OT General Charges $OT Visit: 1 Procedure OT Treatments $Therapeutic Activity: 23-37 mins  Vickki Muff 03/18/2014, 11:17 AM [phone]

## 2014-03-18 NOTE — Progress Notes (Signed)
[facility] PROGRESS NOTE  Douglas [name] FYB:[id number] DOB: [birth year] DOA: 02/28/2014 PCP: [name], MD  Assessment/Plan: Patient is a 58 year old male with history of follicular low-grade non-Hodgkin lymphoma, large left groin lymphadenopathy, diagnosed in 2000 and was on recent chemotherapy, had admission for acute renal failure 3/17- 3/20. He was suspected to have acute renal failure de to HCTZ and ARB.  He had outpatient left-sided inguinal node biopsy after which he has developed a large lymph filled seroma on the left groin requiring wound VAC placement and continuous drainage, he continues to drain about liter liter and a half on a daily basis, there were some superinfection which is responding well to antibiotics which are presently on board. Have discussed his case multiple times with general surgery who do not recommend any further surgical intervention. Once he has a rehabilitation bed he could be placed in a rehabilitation as he requires intermittent IV fluid boluses for ongoing fluid loss due to daily lymph drainage from the wound VAC.   He is currently stable from medicine standpoint for discharge to a monitored setting like rehabilitation/SNF, case management and social worker have been informed on 03/10/2014 for the same. His long-term prognosis although appears not to good due to underlying lymphoma, ESRD, anasarca and baseline low blood pressures.   SIGNIFICANT EVENTS / STUDIES:  4/3 Hyperkalemia, hyperuricemia, hyperphosphatemia, with ARF requiring emergent CRRT. UA with urate crystals. Renal US normal. No pulmonary edema on CXR. Heparin gtt started due to concern for PE. Started IV vanc for left groin wound.  4/4 ARF thought to be due to ATN from severe sepsis, per oncology not tumor lysis syndrome. No DVT, heparin gtt stopped. 2D-echo normal.  4/5 Continue CRRT, to transition to HD, no renal recovery yet. Hyperuricemia resolved. Dialysis access is a right IJ  dialysis catheter  4/6 .Wound vac placed  4/7 -hemodialysis initiated   Assessment/Plan:  1. Acute kidney injury-potentially related to tumor lysis now ESRD :  - Etiology prerenal from volume loss from her fistula versus severe sepsis from left groin infection. Per Oncology tumor lysis syndrome less likely, Currently undergoing dialysis per renal on a 3 times a week basis. Discussed with renal now likely ESRD.  -per nephrology not a candidate for outpatient HD yet  2. NHL (non-Hodgkin's lymphoma) with follicular low-grade, large left groin lymph adenopathy status post chemotherapy, hyperuricemia  Hyperuricemia has resolved, oncology following (Dr.Magrinat), stable G6PD, LDH levels are around 600 which is twice normal, transfuse PRBCs as needed. CT abdomen for restaging outpatient home along with outpatient followup with oncology post discharge.  3. 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  Since his blood pressures are low, got totaled 2 units of packed RBCs one each on 03/11/2014 and 03/12/2014, no signs of ongoing bleed, post transfusion stable H&H.  4. 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 Hulen Skains on 02/24/2014, patient to be discharged with home health and wound VAC, will follow with Dr. Barkley Bruns in the office post discharge.  - Noted wound growing gram-negative rods was placed on doxycycline + cipro on 02/25/2014 stopped on 03/10/14, monitor temperature curve, leukocytosis improved.  5. Intermittent tachycardic and hypotension  Likely due to fluid removal during HD, along with ongoing fluid loss from his left inguinal drain , will supplement with IV fluids as needed, have discussed with surgery, they have no other solution then to continue drainage this time. Per Dr. Barkley Bruns and Hulen Skains no further surgical  intervention is required. Steroids were tapered off. Increased midodrene. Stable random cortisol and post  transfusion H&H.  6. Type 2 diabetes mellitus  - Continue sliding scale insulin  7. GERD with history of GI bleed: Continue PPI     Code Status: full Family Communication:  D/w patient, called updated [name],[name] [phone] (indicate person spoken with, relationship, and if by phone, the number) Disposition Plan: CIR vs LTEC  Consultants:  Surgery  Nephrology  Oncology Procedures:  Dialysis  Wound VAC Antibiotics:  Anti-infectives    Start    Dose/Rate  Route  Frequency  Ordered  Stop    02/26/2014 0953   ceFAZolin (ANCEF) 1-5 GM-% IVPB     03/01/2014 0953  02/27/2014 2159      Comments: [name] : cabinet override         03/07/2014 0953   ceFAZolin (ANCEF) 2-3 GM-% IVPB SOLR     03/12/2014 0953  03/07/2014 2159      Comments: [name] : cabinet override         02/26/2014 0600   [MAR Hold] ceFAZolin (ANCEF) 3 g in dextrose 5 % 50 mL IVPB (On MAR Hold since 03/02/2014 0850)  3 g  160 mL/hr over 30 Minutes  Intravenous  On call  03/07/14 1556  03/03/2014 1021    03/03/2014 0000   ceFAZolin (ANCEF) IVPB 1 g/50 mL premix Status: Discontinued  1 g  100 mL/hr over 30 Minutes  Intravenous  On call  03/07/14 1540  03/07/14 1555      Comments: Send with pt to OR         03/01/14 1800   doxycycline (VIBRA-TABS) tablet 100 mg Status: Discontinued  100 mg  Oral  Every 12 hours  03/01/14 1522  03/10/14 1119    03/01/14 0800   ciprofloxacin (CIPRO) tablet 500 mg Status: Discontinued  500 mg  Oral  Daily with breakfast  02/28/14 1106  03/10/14 1119    02/26/14 0000   doxycycline (VIBRAMYCIN) 100 MG capsule  100 mg  Oral  2 times daily  02/26/14 1153     02/26/14 0000   ciprofloxacin (CIPRO) 500 MG tablet  500 mg  Oral  2 times daily  02/26/14 1153     02/25/14 1100   ciprofloxacin (CIPRO) IVPB 400 mg Status: Discontinued  400 mg  200 mL/hr over 60 Minutes  Intravenous  Every 12 hours  02/25/14 1047  02/28/14 1106    02/24/14 1830   doxycycline (VIBRAMYCIN) 100 mg in dextrose 5 % 250 mL IVPB  Status: Discontinued  100 mg  125 mL/hr over 120 Minutes  Intravenous  Every 12 hours  02/24/14 1756  03/01/14 1521    02/24/14 1800   cefTRIAXone (ROCEPHIN) 1 g in dextrose 5 % 50 mL IVPB Status: Discontinued  1 g  100 mL/hr over 30 Minutes  Intravenous  Every 24 hours  02/24/14 1231  02/24/14 1756    02/24/14 1200   vancomycin (VANCOCIN) 1,500 mg in sodium chloride 0.9 % 250 mL IVPB Status: Discontinued  1,500 mg  250 mL/hr over 60 Minutes  Intravenous  Once  02/23/14 1423  02/24/14 1231    02/20/14 2200   vancomycin (VANCOCIN) 1,500 mg in sodium chloride 0.9 % 500 mL IVPB Status: Discontinued  1,500 mg  250 mL/hr over 120 Minutes  Intravenous  Every 24 hours  02/28/2014 2119  02/22/14 1116    03/12/2014 2200   vancomycin (VANCOCIN) 2,000 mg in sodium chloride  0.9 % 500 mL IVPB  2,000 mg  250 mL/hr over 120 Minutes  Intravenous  Once  02/17/2014 2119  02/20/14 0005       HPI/Subjective: alert  Objective: Filed Vitals:   03/18/14 0552  BP: 109/71  Pulse: 112  Temp: 98.6 F (37 C)  Resp: 18    Intake/Output Summary (Last 24 hours) at 03/18/14 1147 Last data filed at 03/17/14 2300  Gross per 24 hour  Intake    360 ml  Output      0 ml  Net    360 ml   Filed Weights   03/17/14 0707 03/17/14 1009 03/17/14 2138  Weight: 219 kg (482 lb 12.9 oz) 219 kg (482 lb 12.9 oz) 217.3 kg (479 lb 1 oz)    Exam:   General:  alert  Cardiovascular: s1,s2 rrr  Respiratory: CTA BL  Abdomen: soft, obese, NT  Musculoskeletal: LE edema   Data Reviewed: Basic Metabolic Panel:  Recent Labs Lab 03/12/14 1238 03/14/14 0540 03/15/14 1030 03/16/14 1005 03/17/14 0500 03/18/14 0815  NA 136* 132* 137  --  133* 132*  K 2.2* 5.3 2.3* 4.3 4.4 4.2  CL 92* 88* 92*  --  89* 86*  CO2 27 22 28   --  21 20  GLUCOSE 101* 129* 94  --  138* 139*  BUN 6 48* 5*  --  49* 41*  CREATININE 1.01 4.78* 0.94  --  4.94* 4.18*  CALCIUM 7.8* 9.0 8.0*  --  9.1 9.1  PHOS 1.1* 6.0* 0.9*  --  5.7* 5.4*   Liver  Function Tests:  Recent Labs Lab 03/12/14 1238 03/14/14 0540 03/15/14 1030 03/17/14 0500 03/18/14 0815  ALBUMIN 2.1* 1.7* 1.9* 1.6* 1.8*   No results found for this basename: LIPASE, AMYLASE,  in the last 168 hours No results found for this basename: AMMONIA,  in the last 168 hours CBC:  Recent Labs Lab 03/12/14 1204 03/14/14 0540 03/15/14 0500 03/16/14 0615 03/16/14 1005 03/17/14 0500 03/18/14 0815  WBC 9.6 8.0 9.2 10.1 9.3 10.0 12.2*  NEUTROABS 7.0 5.6  --  QUESTIONABLE RESULTS, RECOMMEND RECOLLECT TO VERIFY  --  7.1 9.0*  HGB 7.9* 8.8* 9.7* 9.2* 9.2* 8.5* 8.5*  HCT 23.4* 27.0* 30.0* 28.7* 29.0* 26.9* 26.7*  MCV 83.9 87.9 87.7 87.8 90.6 89.4 88.1  PLT 94* 87* 107* 114* 117* 110* 130*   Cardiac Enzymes: No results found for this basename: CKTOTAL, CKMB, CKMBINDEX, TROPONINI,  in the last 168 hours BNP (last 3 results)  Recent Labs  03/18/2014 1220  PROBNP 937.1*   CBG:  Recent Labs Lab 03/16/14 2121 03/17/14 1148 03/17/14 1632 03/17/14 2124 03/18/14 0800  GLUCAP 141* 129* 178* 167* 145*    Recent Results (from the past 240 hour(s))  WOUND CULTURE     Status: None   Collection Time    03/16/14 10:12 AM      Result Value Ref Range Status   Specimen Description WOUND LEG LEFT   Final   Special Requests NONE   Final   Gram Stain     Final   Value: RARE WBC PRESENT, PREDOMINANTLY PMN     NO SQUAMOUS EPITHELIAL CELLS SEEN     ABUNDANT GRAM NEGATIVE RODS     MODERATE GRAM POSITIVE COCCI     IN PAIRS   Culture     Final   Value: ABUNDANT GRAM NEGATIVE RODS     Performed at [facility]   Report Status PENDING  Incomplete     Studies: No results found.  Scheduled Meds: . bisacodyl  10 mg Rectal Daily  . docusate sodium  200 mg Oral BID  . feeding supplement (NEPRO CARB STEADY)  237 mL Oral BID BM  . heparin subcutaneous  5,000 Units Subcutaneous 3 times per day  . insulin aspart  0-9 Units Subcutaneous TID WC  . midodrine  10 mg Oral TID  WC  . multivitamin  1 tablet Oral QHS  . pantoprazole  40 mg Oral Q1200  . polyethylene glycol  17 g Oral Daily  . sodium bicarbonate  1,300 mg Oral TID  . sodium chloride  3 mL Intravenous Q12H   Continuous Infusions: . sodium chloride Stopped (03/15/14 0700)    Active Problems:   NHL (non-Hodgkin's lymphoma)   HTN (hypertension)   Dyslipidemia   Anemia of chronic disease   Morbid obesity   Lower GI bleed   Diabetes mellitus   Tumor lysis syndrome, possibility of   Uremia   SOB (shortness of breath) DDX = CHF vs. PE   Acidosis    Time spent: >35 minutes     [name]  [facility] Pager [phone]. If 7PM-7AM, please contact night-coverage at [email or web address], password [facility] 03/18/2014, 11:47 AM  LOS: 27 days

## 2014-03-18 NOTE — Progress Notes (Signed)
[facility] ROUNDING NOTE   Subjective:   Interval History: no complaints  Objective:  Vital signs in last 24 hours:  Temp:  [97.9 F (36.6 C)-98.8 F (37.1 C)] 97.9 F (36.6 C) (04/30 1321) Pulse Rate:  [102-112] 103 (04/30 1321) Resp:  [18] 18 (04/30 1321) BP: (93-136)/(57-81) 136/81 mmHg (04/30 1321) SpO2:  [91 %-95 %] 95 % (04/30 1321) Weight:  [217.3 kg (479 lb 1 oz)] 217.3 kg (479 lb 1 oz) (04/29 2138)  Weight change:  Filed Weights   03/17/14 0707 03/17/14 1009 03/17/14 2138  Weight: 219 kg (482 lb 12.9 oz) 219 kg (482 lb 12.9 oz) 217.3 kg (479 lb 1 oz)    Intake/Output: I/O last 3 completed shifts: In: 480 [P.O.:480] Out: 715 [Other:714; Stool:1]   Intake/Output this shift:     CVS- RRR  RS- CTA  ABD- BS present soft non-distended  EXT- :+brawny edema, wound vac on left thigh    Basic Metabolic Panel:  Recent Labs Lab 03/12/14 1238 03/14/14 0540 03/15/14 1030 03/16/14 1005 03/17/14 0500 03/18/14 0815  NA 136* 132* 137  --  133* 132*  K 2.2* 5.3 2.3* 4.3 4.4 4.2  CL 92* 88* 92*  --  89* 86*  CO2 27 22 28   --  21 20  GLUCOSE 101* 129* 94  --  138* 139*  BUN 6 48* 5*  --  49* 41*  CREATININE 1.01 4.78* 0.94  --  4.94* 4.18*  CALCIUM 7.8* 9.0 8.0*  --  9.1 9.1  PHOS 1.1* 6.0* 0.9*  --  5.7* 5.4*    Liver Function Tests:  Recent Labs Lab 03/12/14 1238 03/14/14 0540 03/15/14 1030 03/17/14 0500 03/18/14 0815  ALBUMIN 2.1* 1.7* 1.9* 1.6* 1.8*   No results found for this basename: LIPASE, AMYLASE,  in the last 168 hours No results found for this basename: AMMONIA,  in the last 168 hours  CBC:  Recent Labs Lab 03/12/14 1204 03/14/14 0540 03/15/14 0500 03/16/14 0615 03/16/14 1005 03/17/14 0500 03/18/14 0815  WBC 9.6 8.0 9.2 10.1 9.3 10.0 12.2*  NEUTROABS 7.0 5.6  --  QUESTIONABLE RESULTS, RECOMMEND RECOLLECT TO VERIFY  --  7.1 9.0*  HGB 7.9* 8.8* 9.7* 9.2* 9.2* 8.5* 8.5*  HCT 23.4* 27.0* 30.0* 28.7* 29.0* 26.9* 26.7*   MCV 83.9 87.9 87.7 87.8 90.6 89.4 88.1  PLT 94* 87* 107* 114* 117* 110* 130*    Cardiac Enzymes: No results found for this basename: CKTOTAL, CKMB, CKMBINDEX, TROPONINI,  in the last 168 hours  BNP: No components found with this basename: POCBNP,   CBG:  Recent Labs Lab 03/17/14 1148 03/17/14 1632 03/17/14 2124 03/18/14 0800 03/18/14 1202  GLUCAP 129* 178* 167* 145* 25*    Microbiology: Results for orders placed during the hospital encounter of 03/14/2014  MRSA PCR SCREENING     Status: Abnormal   Collection Time    03/08/2014  5:27 PM      Result Value Ref Range Status   MRSA by PCR POSITIVE (*) NEGATIVE Final   Comment:            The GeneXpert MRSA Assay (FDA     approved for NASAL specimens     only), is one component of a     comprehensive MRSA colonization     surveillance program. It is not     intended to diagnose MRSA     infection nor to guide or     monitor treatment for  MRSA infections.     RESULT CALLED TO, READ BACK BY AND VERIFIED WITH:     Linda Hedges RN 2154 02/22/2014 A BROWNING  WOUND CULTURE     Status: None   Collection Time    02/22/2014  5:27 PM      Result Value Ref Range Status   Specimen Description WOUND   Final   Special Requests IMMUNOCOMPROMISED LEFT INGUINAL AREA   Final   Gram Stain     Final   Value: NO WBC SEEN     NO SQUAMOUS EPITHELIAL CELLS SEEN     RARE GRAM NEGATIVE RODS     Performed at [facility]   Culture     Final   Value: MULTIPLE ORGANISMS PRESENT, NONE PREDOMINANT     Note: NO STAPHYLOCOCCUS AUREUS ISOLATED NO GROUP A STREP (S.PYOGENES) ISOLATED     Performed at [facility]   Report Status 02/22/2014 FINAL   Final  CULTURE, BLOOD (ROUTINE X 2)     Status: None   Collection Time    02/21/14  5:31 PM      Result Value Ref Range Status   Specimen Description BLOOD LEFT HAND   Final   Special Requests BOTTLES DRAWN AEROBIC ONLY 8CC   Final   Culture  Setup Time     Final   Value: 02/22/2014 02:22      Performed at [facility]   Culture     Final   Value: NO GROWTH 5 DAYS     Performed at [facility]   Report Status 02/28/2014 FINAL   Final  CULTURE, BLOOD (ROUTINE X 2)     Status: None   Collection Time    02/21/14  5:40 PM      Result Value Ref Range Status   Specimen Description BLOOD LEFT HAND   Final   Special Requests BOTTLES DRAWN AEROBIC ONLY 10 CC   Final   Culture  Setup Time     Final   Value: 02/22/2014 02:23     Performed at [facility]   Culture     Final   Value: NO GROWTH 5 DAYS     Performed at [facility]   Report Status 02/28/2014 FINAL   Final  WOUND CULTURE     Status: None   Collection Time    03/16/14 10:12 AM      Result Value Ref Range Status   Specimen Description WOUND LEG LEFT   Final   Special Requests NONE   Final   Gram Stain     Final   Value: RARE WBC PRESENT, PREDOMINANTLY PMN     NO SQUAMOUS EPITHELIAL CELLS SEEN     ABUNDANT GRAM NEGATIVE RODS     MODERATE GRAM POSITIVE COCCI     IN PAIRS   Culture     Final   Value: ABUNDANT GRAM NEGATIVE RODS     Performed at [facility]   Report Status PENDING   Incomplete    Coagulation Studies: No results found for this basename: LABPROT, INR,  in the last 72 hours  Urinalysis: No results found for this basename: COLORURINE, APPERANCEUR, LABSPEC, PHURINE, GLUCOSEU, HGBUR, BILIRUBINUR, KETONESUR, PROTEINUR, UROBILINOGEN, NITRITE, LEUKOCYTESUR,  in the last 72 hours    Imaging: No results found.   Medications:   . sodium chloride Stopped (03/15/14 0700)   . bisacodyl  10 mg Rectal Daily  . docusate sodium  200 mg Oral  BID  . feeding supplement (NEPRO CARB STEADY)  237 mL Oral BID BM  . heparin subcutaneous  5,000 Units Subcutaneous 3 times per day  . insulin aspart  0-9 Units Subcutaneous TID WC  . midodrine  10 mg Oral TID WC  . multivitamin  1 tablet Oral QHS  . pantoprazole  40 mg Oral Q1200  . polyethylene glycol  17 g Oral Daily   . sodium bicarbonate  1,300 mg Oral TID  . sodium chloride  3 mL Intravenous Q12H   diphenhydrAMINE, guaiFENesin-dextromethorphan, heparin, hydrOXYzine, lactulose, oxyCODONE-acetaminophen, simethicone, sodium chloride, sodium chloride  Assessment/ Plan:  1.ABLA/Anemia of malignancy and CKD:EPO per Heme/onc  1. s/p second blood transfusion  2. Cont to follow H/H 3. 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 Plan per Heme/Onc 3 ESRD: has remained HD-dependent and anuric, presumably due to ischemic ATN without recovery. Will need placement of permcath. Cont with HD 3 x week. TThSat  4. 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  6. Vascular access- has RIJ PC but will need AVF/AVG once stabilized.    Disposition is going to a challenge in the face of poor mobility and the need for dialysis treatments   LOS: [place] @TODAY @3 :31 PM

## 2014-03-18 NOTE — Progress Notes (Signed)
Patient ID: Douglas [name], male   DOB: 1956-05-07, 58 y.o.   MRN: [id number] 10 Days Post-Op  Subjective: Pt feels ok  Objective: Vital signs in last 24 hours: Temp:  [98.6 F (37 C)-99 F (37.2 C)] 98.6 F (37 C) (04/30 0552) Pulse Rate:  [102-117] 112 (04/30 0552) Resp:  [18-19] 18 (04/30 0552) BP: (93-123)/(39-71) 109/71 mmHg (04/30 0552) SpO2:  [91 %-93 %] 91 % (04/30 0552) Weight:  [479 lb 1 oz (217.3 kg)] 479 lb 1 oz (217.3 kg) (04/29 2138) Last BM Date: 03/17/14  Intake/Output from previous day: 04/29 0701 - 04/30 0700 In: 360 [P.O.:360] Out: 715 [Stool:1] Intake/Output this shift:    PE: Skin: wound is not packed correctly.  It is not packed down tunnel.  Drainage still brownish, but more lymphatic drainage today than the other day.  No definite evidence of abscess or infection.  No erythema.  Diffuse lymphedema of that leg  Lab Results:   Recent Labs  03/17/14 0500 03/18/14 0815  WBC 10.0 12.2*  HGB 8.5* 8.5*  HCT 26.9* 26.7*  PLT 110* 130*   BMET  Recent Labs  03/17/14 0500 03/18/14 0815  NA 133* 132*  K 4.4 4.2  CL 89* 86*  CO2 21 20  GLUCOSE 138* 139*  BUN 49* 41*  CREATININE 4.94* 4.18*  CALCIUM 9.1 9.1   PT/INR No results found for this basename: LABPROT, INR,  in the last 72 hours CMP     Component Value Date/Time   NA 132* 03/18/2014 0815   NA 137 01/19/2014 1101   K 4.2 03/18/2014 0815   K 3.7 01/19/2014 1101   CL 86* 03/18/2014 0815   CO2 20 03/18/2014 0815   CO2 27 01/19/2014 1101   GLUCOSE 139* 03/18/2014 0815   GLUCOSE 204* 01/19/2014 1101   BUN 41* 03/18/2014 0815   BUN 15.8 01/19/2014 1101   CREATININE 4.18* 03/18/2014 0815   CREATININE 0.9 01/19/2014 1101   CALCIUM 9.1 03/18/2014 0815   CALCIUM 9.8 01/19/2014 1101   PROT 6.8 02/24/2014 0545   PROT 7.3 01/19/2014 1101   ALBUMIN 1.8* 03/18/2014 0815   ALBUMIN 2.0* 01/19/2014 1101   AST 33 02/24/2014 0545   AST 23 01/19/2014 1101   ALT 11 02/24/2014 0545   ALT 15 01/19/2014 1101   ALKPHOS 85  02/24/2014 0545   ALKPHOS 186* 01/19/2014 1101   BILITOT 0.3 02/24/2014 0545   BILITOT 0.44 01/19/2014 1101   GFRNONAA 15* 03/18/2014 0815   GFRAA 17* 03/18/2014 0815   Lipase  No results found for this basename: lipase       Studies/Results: No results found.  Anti-infectives: Anti-infectives   Start     Dose/Rate Route Frequency Ordered Stop   2014/04/01 0953  ceFAZolin (ANCEF) 1-5 GM-% IVPB    Comments:  [name]   : cabinet override      04/01/14 4709 04-01-14 2159   04-01-2014 0953  ceFAZolin (ANCEF) 2-3 GM-% IVPB SOLR    Comments:  [name]   : cabinet override      Apr 01, 2014 0953 04-01-2014 2159   Apr 01, 2014 0600  [MAR Hold]  ceFAZolin (ANCEF) 3 g in dextrose 5 % 50 mL IVPB     (On MAR Hold since 04-01-2014 0850)   3 g 160 mL/hr over 30 Minutes Intravenous On call 03/07/14 1556 2014/04/01 1021   01-Apr-2014 0000  ceFAZolin (ANCEF) IVPB 1 g/50 mL premix  Status:  Discontinued    Comments:  Send with pt to  OR   1 g 100 mL/hr over 30 Minutes Intravenous On call 03/07/14 1540 03/07/14 1555   03/01/14 1800  doxycycline (VIBRA-TABS) tablet 100 mg  Status:  Discontinued     100 mg Oral Every 12 hours 03/01/14 1522 03/10/14 1119   03/01/14 0800  ciprofloxacin (CIPRO) tablet 500 mg  Status:  Discontinued     500 mg Oral Daily with breakfast 02/28/14 1106 03/10/14 1119   02/26/14 0000  doxycycline (VIBRAMYCIN) 100 MG capsule     100 mg Oral 2 times daily 02/26/14 1153     02/26/14 0000  ciprofloxacin (CIPRO) 500 MG tablet     500 mg Oral 2 times daily 02/26/14 1153     02/25/14 1100  ciprofloxacin (CIPRO) IVPB 400 mg  Status:  Discontinued     400 mg 200 mL/hr over 60 Minutes Intravenous Every 12 hours 02/25/14 1047 02/28/14 1106   02/24/14 1830  doxycycline (VIBRAMYCIN) 100 mg in dextrose 5 % 250 mL IVPB  Status:  Discontinued     100 mg 125 mL/hr over 120 Minutes Intravenous Every 12 hours 02/24/14 1756 03/01/14 1521   02/24/14 1800  cefTRIAXone (ROCEPHIN) 1 g in dextrose 5 % 50 mL IVPB   Status:  Discontinued     1 g 100 mL/hr over 30 Minutes Intravenous Every 24 hours 02/24/14 1231 02/24/14 1756   02/24/14 1200  vancomycin (VANCOCIN) 1,500 mg in sodium chloride 0.9 % 250 mL IVPB  Status:  Discontinued     1,500 mg 250 mL/hr over 60 Minutes Intravenous  Once 02/23/14 1423 02/24/14 1231   02/20/14 2200  vancomycin (VANCOCIN) 1,500 mg in sodium chloride 0.9 % 500 mL IVPB  Status:  Discontinued     1,500 mg 250 mL/hr over 120 Minutes Intravenous Every 24 hours 02/25/2014 2119 02/22/14 1116   02/21/2014 2200  vancomycin (VANCOCIN) 2,000 mg in sodium chloride 0.9 % 500 mL IVPB     2,000 mg 250 mL/hr over 120 Minutes Intravenous  Once 03/17/2014 2119 02/20/14 0005       Assessment/Plan  1. Lymphatic leak, s/p LN biopsy by IR  Plan: 1. Wound cont WD dressing changes for now, until drainage clears up.  Once this occurs may replace a VAC.  We will sign off and defer further wound care to [facility], RN.  Spoke to Dr. Daleen Bo about this.   LOS: 27 days    Henreitta Cea 03/18/2014, 10:51 AM Pager: [phone]

## 2014-03-18 NOTE — Progress Notes (Addendum)
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  As of 03/18/14, pt is ESRD and requiring HD.      PT Comments    Pt. Was seen by this therapist in conjunction with OT and an additional PT with bariatric expertise.  Last portion of session was spent problem solving for a different alternative to standing from bed attempts , to hopefully increase success at standing.  I have coordinated with Jonelle Sidle, Soil scientist on the unit that pt. will be up in chair using maxi sky with nursing staff by 0715 in the am.  Dialysis has been scheduled for 10 or later so PT can attempt to have pt. Stand from the [place] chair.  PT,. will conduct session at 0745.  Maxi sky pad has been ordered by Biomedical engineer and RN to pick up from Immunologist.  The size of pt's L LE and decreased strength and ROM inhibit his ability to stand greatly.  PT goals reviewed and remain appropriate.  Follow Up Recommendations  CIR;Supervision/Assistance - 24 hour     Equipment Recommendations  Rolling walker with 5" wheels    Recommendations for Other Services       Precautions / Restrictions Precautions Precautions: Fall Precaution Comments: morbid obesity Restrictions Weight Bearing Restrictions: No    Mobility  Bed Mobility Overal bed mobility: Needs Assistance Bed Mobility: Supine to Sit;Sit to Supine Rolling: Mod assist;+2 for physical assistance   Supine to sit: Max assist;+2 for physical assistance;+2 for safety/equipment Sit to supine: Total assist;+2 for  physical assistance   General bed mobility comments: tehcnique ceus, 2 total assist to move side to sit with help needed especially at shoulders  Transfers Overall transfer level: Needs assistance Equipment used: 2 person hand held assist;None Transfers: Sit to/from Stand Sit to Stand: +2 physical assistance;Total assist         General transfer comment: Due to size and limitations in ROM and strength of L LE, pt. unable to flex L hip/knee adequately to provide a base for him to move over to achieve standing from bed surface.  Used 2 person technique with sheet wrapped around patient and therapists shift backward for leverage.  Also had 3 rd person available to maintain position of L foot to prevent forward slide in stand attempt.  Two trials with pt. achieving only 4" clearance of buttocks off bed (rear postion of buttocks cleared, not forward portion)  Ambulation/Gait Ambulation/Gait assistance:  (pt. unable)               Stairs            Wheelchair Mobility    Modified Rankin (Stroke Patients Only)       Balance Overall balance assessment: Needs assistance Sitting-balance support: Feet supported Sitting balance-Leahy Scale: Poor Sitting balance - Comments: Pt has difficulty sitting on EOB for too long w/o losing balance.  pt can sit statically for appx 1-2 minutes before fatiguing and having to support self w arms or hold to bed rails to stay in sitting.  Cognition Arousal/Alertness: Awake/alert Behavior During Therapy: WFL for tasks assessed/performed Overall Cognitive Status: Within Functional Limits for tasks assessed                      Exercises General Exercises - Lower Extremity Ankle Circles/Pumps: AROM;Both;10 reps;Supine Quad Sets: AROM;Both;10 reps;Supine Gluteal Sets: AROM;Both;10 reps;Supine Short Arc Quad: AROM;Left;10 reps;Supine Long Arc Quad: AROM;AAROM;Left;5 reps;Seated Heel Slides:  AAROM;Left;5 reps;Supine Straight Leg Raises: AAROM;Right;Left;5 reps;Supine    General Comments        Pertinent Vitals/Pain See vitals tab No distress during session    Home Living                      Prior Function            PT Goals (current goals can now be found in the care plan section) Acute Rehab PT Goals Time For Goal Achievement: 04/01/14 Potential to Achieve Goals: Fair Progress towards PT goals: Progressing toward goals    Frequency  Min 3X/week    PT Plan Current plan remains appropriate    Co-evaluation   Reason for Co-Treatment: Complexity of the patient's impairments (multi-system involvement) PT goals addressed during session: Mobility/safety with mobility;Balance;Strengthening/ROM       End of Session Equipment Utilized During Treatment: Gait belt (sheet roll as gait belt) Activity Tolerance: Patient tolerated treatment well;Patient limited by fatigue Patient left: in bed;with call bell/phone within reach     Time: 6063-0160 PT Time Calculation (min): 61 min  Charges:  $Therapeutic Exercise: 8-22 mins $Therapeutic Activity: 8-22 mins                    G CodesLadona Ridgel 03/18/2014, 5:26 PM Gerlean Ren PT Acute Rehab Services [place] [phone]

## 2014-03-18 NOTE — Progress Notes (Signed)
Physical Therapy Treatment Patient Details Name: Douglas [name] MRN: [id number] DOB: 10/01/1956 Today's Date: 03/18/2014    History of Present Illness 58 yo M with pmh of follicular low-grade non-Hodgkin's lymphoma with large left groin lymphadenopathy diagnosed in 2009 with recent chemotherapy and admission for AKI 3/17-3/20 who presented as a transfer from [place] with 1.5 week history of shortness of breath found to be volume overloaded with hyperkalemia and acute renal failure requiring emergent HD.  As of 03/18/14, pt is ESRD and requiring HD.      PT Comments    Pt. Shows good motivation to work on exercises in bed to facilitate overall strength for assisting in standing attempts. Pt. Agreeable to 0745 am session tomorrow.    Follow Up Recommendations  CIR;Supervision/Assistance - 24 hour     Equipment Recommendations  Rolling walker with 5" wheels    Recommendations for Other Services       Precautions / Restrictions Precautions Precautions: Fall Precaution Comments: morbid obesity Restrictions Weight Bearing Restrictions: No    Mobility  Bed Mobility Overal bed mobility: Needs Assistance Bed Mobility: Supine to Sit;Sit to Supine Rolling: Mod assist;+2 for physical assistance   Supine to sit: Max assist;+2 for physical assistance;+2 for safety/equipment Sit to supine: Total assist;+2 for physical assistance   General bed mobility comments: tehcnique ceus, 2 total assist to move side to sit with help needed especially at shoulders  Transfers Overall transfer level: Needs assistance Equipment used: 2 person hand held assist;None Transfers: Sit to/from Stand Sit to Stand: +2 physical assistance;Total assist         General transfer comment: Due to size and limitations in ROM and strength of L LE, pt. unable to flex L hip/knee adequately to provide a base for him to move over to achieve standing from bed surface.  Used 2 person technique with sheet wrapped  around patient and therapists shift backward for leverage.  Also had 3 rd person available to maintain position of L foot to prevent forward slide in stand attempt.  Two trials with pt. achieving only 4" clearance of buttocks off bed (rear postion of buttocks cleared, not forward portion)  Ambulation/Gait Ambulation/Gait assistance:  (pt. unable)               Stairs            Wheelchair Mobility    Modified Rankin (Stroke Patients Only)       Balance Overall balance assessment: Needs assistance Sitting-balance support: Feet supported Sitting balance-Leahy Scale: Poor Sitting balance - Comments: Pt has difficulty sitting on EOB for too long w/o losing balance.  pt can sit statically for appx 1-2 minutes before fatiguing and having to support self w arms or hold to bed rails to stay in sitting.                            Cognition Arousal/Alertness: Awake/alert Behavior During Therapy: WFL for tasks assessed/performed Overall Cognitive Status: Within Functional Limits for tasks assessed                      Exercises General Exercises - Lower Extremity Ankle Circles/Pumps: AROM;Both;10 reps;Supine Quad Sets: AROM;Both;10 reps;Supine Gluteal Sets: AROM;Both;10 reps;Supine Short Arc Quad: AROM;Left;10 reps;Supine Long Arc Quad: AROM;AAROM;Left;5 reps;Seated Heel Slides: AAROM;Left;5 reps;Supine;AROM;Right;10 reps Straight Leg Raises: AAROM;Right;Left;5 reps;Supine Other Exercises Other Exercises: Practiced 1/2 bridging using R LE to facilitate rolling toward L side.  Pt. encouraged  to practice these exercises throughout the eveneing as he can tolerate to try to build some strength.    General Comments        Pertinent Vitals/Pain See vitals tab No pain at present    Home Living                      Prior Function            PT Goals (current goals can now be found in the care plan section) Acute Rehab PT Goals Time For Goal  Achievement: 04/01/14 Potential to Achieve Goals: Fair Progress towards PT goals: Progressing toward goals    Frequency  Min 3X/week    PT Plan Current plan remains appropriate    Co-evaluation   Reason for Co-Treatment: Complexity of the patient's impairments (multi-system involvement) PT goals addressed during session: Mobility/safety with mobility;Balance;Strengthening/ROM       End of Session Equipment Utilized During Treatment: Gait belt (sheet roll as gait belt) Activity Tolerance: Patient tolerated treatment well Patient left: in bed;with call bell/phone within reach;with family/visitor present     Time: 4235-3614 PT Time Calculation (min): 23 min  Charges:  $Therapeutic Exercise: 23-37 mins $Therapeutic Activity: 8-22 mins                    G CodesLadona Ridgel 03/18/2014, 5:41 PM Gerlean Ren PT Acute Rehab Services [place] [phone]

## 2014-03-18 NOTE — Progress Notes (Signed)
Pt can place on CPAP mask when ready for bed. Pt encouraged to call RT if needing any assistance. No distress noted at this time.

## 2014-03-19 DIAGNOSIS — R0602 Shortness of breath: Secondary | ICD-10-CM

## 2014-03-19 LAB — CBC WITH DIFFERENTIAL/PLATELET
BASOS PCT: 0 % (ref 0–1)
Basophils Absolute: 0 10*3/uL (ref 0.0–0.1)
Eosinophils Absolute: 0.1 10*3/uL (ref 0.0–0.7)
Eosinophils Relative: 1 % (ref 0–5)
HCT: 27.1 % — ABNORMAL LOW (ref 39.0–52.0)
Hemoglobin: 8.6 g/dL — ABNORMAL LOW (ref 13.0–17.0)
LYMPHS ABS: 0.7 10*3/uL (ref 0.7–4.0)
LYMPHS PCT: 5 % — AB (ref 12–46)
MCH: 28.2 pg (ref 26.0–34.0)
MCHC: 31.7 g/dL (ref 30.0–36.0)
MCV: 88.9 fL (ref 78.0–100.0)
Monocytes Absolute: 2.1 10*3/uL — ABNORMAL HIGH (ref 0.1–1.0)
Monocytes Relative: 17 % — ABNORMAL HIGH (ref 3–12)
Neutro Abs: 9.4 10*3/uL — ABNORMAL HIGH (ref 1.7–7.7)
Neutrophils Relative %: 77 % (ref 43–77)
Platelets: 144 10*3/uL — ABNORMAL LOW (ref 150–400)
RBC: 3.05 MIL/uL — ABNORMAL LOW (ref 4.22–5.81)
RDW: 19.4 % — AB (ref 11.5–15.5)
WBC: 12.2 10*3/uL — ABNORMAL HIGH (ref 4.0–10.5)

## 2014-03-19 LAB — GLUCOSE, CAPILLARY
GLUCOSE-CAPILLARY: 125 mg/dL — AB (ref 70–99)
GLUCOSE-CAPILLARY: 96 mg/dL (ref 70–99)
Glucose-Capillary: 136 mg/dL — ABNORMAL HIGH (ref 70–99)
Glucose-Capillary: 98 mg/dL (ref 70–99)

## 2014-03-19 LAB — RENAL FUNCTION PANEL
Albumin: 1.7 g/dL — ABNORMAL LOW (ref 3.5–5.2)
BUN: 48 mg/dL — AB (ref 6–23)
CO2: 19 mEq/L (ref 19–32)
CREATININE: 4.75 mg/dL — AB (ref 0.50–1.35)
Calcium: 9.1 mg/dL (ref 8.4–10.5)
Chloride: 86 mEq/L — ABNORMAL LOW (ref 96–112)
GFR calc Af Amer: 14 mL/min — ABNORMAL LOW (ref >90)
GFR calc non Af Amer: 12 mL/min — ABNORMAL LOW (ref >90)
GLUCOSE: 143 mg/dL — AB (ref 70–99)
PHOSPHORUS: 5.7 mg/dL — AB (ref 2.3–4.6)
Potassium: 4.2 mEq/L (ref 3.7–5.3)
SODIUM: 131 meq/L — AB (ref 137–147)

## 2014-03-19 MED ORDER — SODIUM CHLORIDE 0.9 % IV SOLN: 100.0000 mL | INTRAVENOUS | Status: DC | PRN

## 2014-03-19 MED ORDER — ALTEPLASE 2 MG IJ SOLR
2.0000 mg | Freq: Once | INTRAMUSCULAR | Status: DC | PRN
  Filled 2014-03-19: qty 2

## 2014-03-19 MED ORDER — NEPRO/CARBSTEADY PO LIQD
237.0000 mL | ORAL | Status: DC | PRN
  Filled 2014-03-19: qty 237

## 2014-03-19 MED ORDER — PENTAFLUOROPROP-TETRAFLUOROETH EX AERO: 1.0000 "application " | INHALATION_SPRAY | CUTANEOUS | Status: DC | PRN

## 2014-03-19 MED ORDER — HEPARIN SODIUM (PORCINE) 1000 UNIT/ML DIALYSIS: 1000.0000 [IU] | INTRAMUSCULAR | Status: DC | PRN

## 2014-03-19 MED ORDER — LIDOCAINE-PRILOCAINE 2.5-2.5 % EX CREA
1.0000 "application " | TOPICAL_CREAM | CUTANEOUS | Status: DC | PRN
  Filled 2014-03-19: qty 5

## 2014-03-19 MED ORDER — LIDOCAINE HCL (PF) 1 % IJ SOLN: 5.0000 mL | INTRAMUSCULAR | Status: DC | PRN

## 2014-03-19 NOTE — Progress Notes (Signed)
I asked for LTACH referral to RN CM per Dr. Beverly Gust' request. Pt not at a level for intense inpt rehab at this time. I will follow. [phone]

## 2014-03-19 NOTE — Progress Notes (Addendum)
[facility] PROGRESS NOTE  Douglas [name] [id number] DOB: 20-Feb-1956 DOA: 02/18/2014 PCP: Douglas Hampshire, MD  Assessment/Plan: Patient is a 58 year old male with history of follicular low-grade non-Hodgkin lymphoma, large left groin lymphadenopathy, diagnosed in 2000 and was on recent chemotherapy, had admission for acute renal failure 3/17- 3/20. He was suspected to have acute renal failure de to HCTZ and ARB.  He had outpatient left-sided inguinal node biopsy after which he has developed a large lymph filled seroma on the left groin requiring wound VAC placement and continuous drainage, he continues to drain about liter liter and a half on a daily basis, there were some superinfection which is responding well to antibiotics which are presently on board. Have discussed his case multiple times with general surgery who do not recommend any further surgical intervention. Once he has a rehabilitation bed he could be placed in a rehabilitation as he requires intermittent IV fluid boluses for ongoing fluid loss due to daily lymph drainage from the wound .   He is currently stable from medicine standpoint for discharge to a monitored setting like LTEC;  -he is not stable enough to go SNF or CIR at this time; , case management and social worker have been informed. His long-term prognosis although appears not to good due to underlying lymphoma, ESRD, anasarca and baseline low blood pressures.   SIGNIFICANT EVENTS / STUDIES:  4/3 Hyperkalemia, hyperuricemia, hyperphosphatemia, with ARF requiring emergent CRRT. UA with urate crystals. Renal US normal. No pulmonary edema on CXR. Heparin gtt started due to concern for PE. Started IV vanc for left groin wound.  4/4 ARF thought to be due to ATN from severe sepsis, per oncology not tumor lysis syndrome. No DVT, heparin gtt stopped. 2D-echo normal.  4/5 Continue CRRT, to transition to HD, no renal recovery yet. Hyperuricemia resolved. Dialysis access is  a right IJ dialysis catheter  4/6 .Wound vac placed  4/7 -hemodialysis initiated   Assessment/Plan:  1. Acute kidney injury-potentially related to tumor lysis now ESRD :  - Etiology prerenal from volume loss from her fistula versus severe sepsis from left groin infection. Per Oncology tumor lysis syndrome less likely, Currently undergoing dialysis per renal on a 3 times a week basis. Discussed with renal now likely ESRD.  -per nephrology not a candidate for outpatient HD yet  2. NHL (non-Hodgkin's lymphoma) with follicular low-grade, large left groin lymph adenopathy status post chemotherapy, hyperuricemia  Hyperuricemia has resolved, oncology following (Dr.Magrinat), stable G6PD, LDH levels are around 600 which is twice normal, transfuse PRBCs as needed. CT abdomen for restaging outpatient home along with outpatient followup with oncology post discharge.  3. 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  Since his blood pressures are low, got totaled 2 units of packed RBCs one each on 03/11/2014 and 03/12/2014, no signs of ongoing bleed, post transfusion stable H&H.  4. 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 Hulen Skains on 02/24/2014, patient to be discharged with home health and wound VAC, will follow with Dr. Barkley Bruns in the office post discharge.  - Noted wound growing gram-negative rods was placed on doxycycline + cipro on 02/25/2014 stopped on 03/10/14; monitor temperature curve, wbc borderline stable  5. Intermittent tachycardic and hypotension  Likely due to fluid removal during HD, along with ongoing fluid loss from his left inguinal drain , will supplement with IV fluids as needed, have discussed with surgery, they have no other solution then to continue  drainage this time. Per Dr. Barkley Bruns and Hulen Skains no further surgical intervention is required. Steroids were tapered off. Increased midodrene. Stable random cortisol and  post transfusion H&H.  -may need IVF bolus intermittent  6. Type 2 diabetes mellitus  - Continue sliding scale insulin  7. GERD with history of GI bleed: Continue PPI   Patient i snot stable enough to go SNF or CIR due to ongoing medical issues, dialysis, wound care, severe deconditioning, int remittent hypotension with HD -But he is a good candidate for long term care like LTEC; CM involved   Code Status: full Family Communication:  D/w patient, called updated [name],[name] [phone] (indicate person spoken with, relationship, and if by phone, the number) Disposition Plan: CIR vs LTEC  Consultants:  Surgery  Nephrology  Oncology Procedures:  Dialysis  Wound VAC Antibiotics:  Anti-infectives    Start    Dose/Rate  Route  Frequency  Ordered  Stop    03/12/2014 0953   ceFAZolin (ANCEF) 1-5 GM-% IVPB     03/04/2014 0953  03/06/2014 2159      Comments: [name] : cabinet override         03/18/2014 0953   ceFAZolin (ANCEF) 2-3 GM-% IVPB SOLR     02/22/2014 0953  02/28/2014 2159      Comments: [name] : cabinet override         03/18/2014 0600   [MAR Hold] ceFAZolin (ANCEF) 3 g in dextrose 5 % 50 mL IVPB (On MAR Hold since 03/04/2014 0850)  3 g  160 mL/hr over 30 Minutes  Intravenous  On call  03/07/14 1556  02/18/2014 1021    03/02/2014 0000   ceFAZolin (ANCEF) IVPB 1 g/50 mL premix Status: Discontinued  1 g  100 mL/hr over 30 Minutes  Intravenous  On call  03/07/14 1540  03/07/14 1555      Comments: Send with pt to OR         03/01/14 1800   doxycycline (VIBRA-TABS) tablet 100 mg Status: Discontinued  100 mg  Oral  Every 12 hours  03/01/14 1522  03/10/14 1119    03/01/14 0800   ciprofloxacin (CIPRO) tablet 500 mg Status: Discontinued  500 mg  Oral  Daily with breakfast  02/28/14 1106  03/10/14 1119    02/26/14 0000   doxycycline (VIBRAMYCIN) 100 MG capsule  100 mg  Oral  2 times daily  02/26/14 1153     02/26/14 0000   ciprofloxacin (CIPRO) 500 MG tablet  500 mg  Oral  2 times daily   02/26/14 1153     02/25/14 1100   ciprofloxacin (CIPRO) IVPB 400 mg Status: Discontinued  400 mg  200 mL/hr over 60 Minutes  Intravenous  Every 12 hours  02/25/14 1047  02/28/14 1106    02/24/14 1830   doxycycline (VIBRAMYCIN) 100 mg in dextrose 5 % 250 mL IVPB Status: Discontinued  100 mg  125 mL/hr over 120 Minutes  Intravenous  Every 12 hours  02/24/14 1756  03/01/14 1521    02/24/14 1800   cefTRIAXone (ROCEPHIN) 1 g in dextrose 5 % 50 mL IVPB Status: Discontinued  1 g  100 mL/hr over 30 Minutes  Intravenous  Every 24 hours  02/24/14 1231  02/24/14 1756    02/24/14 1200   vancomycin (VANCOCIN) 1,500 mg in sodium chloride 0.9 % 250 mL IVPB Status: Discontinued  1,500 mg  250 mL/hr over 60 Minutes  Intravenous  Once  02/23/14 1423  02/24/14 1231  02/20/14 2200   vancomycin (VANCOCIN) 1,500 mg in sodium chloride 0.9 % 500 mL IVPB Status: Discontinued  1,500 mg  250 mL/hr over 120 Minutes  Intravenous  Every 24 hours  03/09/2014 2119  02/22/14 1116    02/26/2014 2200   vancomycin (VANCOCIN) 2,000 mg in sodium chloride 0.9 % 500 mL IVPB  2,000 mg  250 mL/hr over 120 Minutes  Intravenous  Once  03/12/2014 2119  02/20/14 0005       HPI/Subjective: alert  Objective: Filed Vitals:   03/19/14 1049  BP: 79/51  Pulse:   Temp:   Resp:     Intake/Output Summary (Last 24 hours) at 03/19/14 1101 Last data filed at 03/19/14 0900  Gross per 24 hour  Intake    360 ml  Output      0 ml  Net    360 ml   Filed Weights   03/17/14 0707 03/17/14 1009 03/17/14 2138  Weight: 219 kg (482 lb 12.9 oz) 219 kg (482 lb 12.9 oz) 217.3 kg (479 lb 1 oz)    Exam:   General:  alert  Cardiovascular: s1,s2 rrr  Respiratory: CTA BL  Abdomen: soft, obese, NT  Musculoskeletal: LE edema   Data Reviewed: Basic Metabolic Panel:  Recent Labs Lab 03/14/14 0540 03/15/14 1030 03/16/14 1005 03/17/14 0500 03/18/14 0815 03/19/14 0725  NA 132* 137  --  133* 132* 131*  K 5.3 2.3* 4.3 4.4 4.2 4.2  CL 88*  92*  --  89* 86* 86*  CO2 22 28  --  21 20 19   GLUCOSE 129* 94  --  138* 139* 143*  BUN 48* 5*  --  49* 41* 48*  CREATININE 4.78* 0.94  --  4.94* 4.18* 4.75*  CALCIUM 9.0 8.0*  --  9.1 9.1 9.1  PHOS 6.0* 0.9*  --  5.7* 5.4* 5.7*   Liver Function Tests:  Recent Labs Lab 03/14/14 0540 03/15/14 1030 03/17/14 0500 03/18/14 0815 03/19/14 0725  ALBUMIN 1.7* 1.9* 1.6* 1.8* 1.7*   No results found for this basename: LIPASE, AMYLASE,  in the last 168 hours No results found for this basename: AMMONIA,  in the last 168 hours CBC:  Recent Labs Lab 03/14/14 0540  03/16/14 0615 03/16/14 1005 03/17/14 0500 03/18/14 0815 03/19/14 0725  WBC 8.0  < > 10.1 9.3 10.0 12.2* PENDING  NEUTROABS 5.6  --  QUESTIONABLE RESULTS, RECOMMEND RECOLLECT TO VERIFY  --  7.1 9.0* PENDING  HGB 8.8*  < > 9.2* 9.2* 8.5* 8.5* 8.6*  HCT 27.0*  < > 28.7* 29.0* 26.9* 26.7* 27.1*  MCV 87.9  < > 87.8 90.6 89.4 88.1 88.9  PLT 87*  < > 114* 117* 110* 130* 144*  < > = values in this interval not displayed. Cardiac Enzymes: No results found for this basename: CKTOTAL, CKMB, CKMBINDEX, TROPONINI,  in the last 168 hours BNP (last 3 results)  Recent Labs  02/22/2014 1220  PROBNP 937.1*   CBG:  Recent Labs Lab 03/18/14 0800 03/18/14 1202 03/18/14 1633 03/18/14 2154 03/19/14 0743  GLUCAP 145* 123* 116* 125* 136*    Recent Results (from the past 240 hour(s))  WOUND CULTURE     Status: None   Collection Time    03/16/14 10:12 AM      Result Value Ref Range Status   Specimen Description WOUND LEG LEFT   Final   Special Requests NONE   Final   Gram Stain     Final   Value:  RARE WBC PRESENT, PREDOMINANTLY PMN     NO SQUAMOUS EPITHELIAL CELLS SEEN     ABUNDANT GRAM NEGATIVE RODS     MODERATE GRAM POSITIVE COCCI     IN PAIRS   Culture     Final   Value: ABUNDANT ACINETOBACTER CALCOACETICUS/BAUMANNII COMPLEX     Performed at [facility]   Report Status PENDING   Incomplete   Organism ID,  Bacteria ACINETOBACTER CALCOACETICUS/BAUMANNII COMPLEX   Final     Studies: No results found.  Scheduled Meds: . bisacodyl  10 mg Rectal Daily  . docusate sodium  200 mg Oral BID  . feeding supplement (NEPRO CARB STEADY)  237 mL Oral BID BM  . heparin subcutaneous  5,000 Units Subcutaneous 3 times per day  . insulin aspart  0-9 Units Subcutaneous TID WC  . midodrine  10 mg Oral TID WC  . multivitamin  1 tablet Oral QHS  . pantoprazole  40 mg Oral Q1200  . polyethylene glycol  17 g Oral Daily  . sodium bicarbonate  1,300 mg Oral TID  . sodium chloride  3 mL Intravenous Q12H   Continuous Infusions: . sodium chloride Stopped (03/15/14 0700)    Active Problems:   NHL (non-Hodgkin's lymphoma)   HTN (hypertension)   Dyslipidemia   Anemia of chronic disease   Morbid obesity   Lower GI bleed   Diabetes mellitus   Tumor lysis syndrome, possibility of   Uremia   SOB (shortness of breath) DDX = CHF vs. PE   Acidosis    Time spent: >35 minutes     [name]  [facility] Pager [phone]. If 7PM-7AM, please contact night-coverage at [email or web address], password [facility] 03/19/2014, 11:01 AM  LOS: 28 days

## 2014-03-19 NOTE — Progress Notes (Signed)
[facility] ROUNDING NOTE   Subjective:   Interval History: no change scheduled for dialysis today  Objective:  Vital signs in last 24 hours:  Temp:  [97.7 F (36.5 C)-98.2 F (36.8 C)] 98 F (36.7 C) (05/01 0509) Pulse Rate:  [98-103] 99 (05/01 0509) Resp:  [17-19] 18 (05/01 0509) BP: (79-136)/(44-81) 79/51 mmHg (05/01 1049) SpO2:  [94 %-96 %] 96 % (05/01 0509)  Weight change:  Filed Weights   03/17/14 0707 03/17/14 1009 03/17/14 2138  Weight: 219 kg (482 lb 12.9 oz) 219 kg (482 lb 12.9 oz) 217.3 kg (479 lb 1 oz)    Intake/Output: I/O last 3 completed shifts: In: 360 [P.O.:360] Out: -    Intake/Output this shift:  Total I/O In: 120 [P.O.:120] Out: -   CVS- RRR  RS- CTA  ABD- BS present soft non-distended  EXT- :+brawny edema, wound vac on left thigh    Basic Metabolic Panel:  Recent Labs Lab 03/14/14 0540 03/15/14 1030 03/16/14 1005 03/17/14 0500 03/18/14 0815 03/19/14 0725  NA 132* 137  --  133* 132* 131*  K 5.3 2.3* 4.3 4.4 4.2 4.2  CL 88* 92*  --  89* 86* 86*  CO2 22 28  --  21 20 19   GLUCOSE 129* 94  --  138* 139* 143*  BUN 48* 5*  --  49* 41* 48*  CREATININE 4.78* 0.94  --  4.94* 4.18* 4.75*  CALCIUM 9.0 8.0*  --  9.1 9.1 9.1  PHOS 6.0* 0.9*  --  5.7* 5.4* 5.7*    Liver Function Tests:  Recent Labs Lab 03/14/14 0540 03/15/14 1030 03/17/14 0500 03/18/14 0815 03/19/14 0725  ALBUMIN 1.7* 1.9* 1.6* 1.8* 1.7*   No results found for this basename: LIPASE, AMYLASE,  in the last 168 hours No results found for this basename: AMMONIA,  in the last 168 hours  CBC:  Recent Labs Lab 03/14/14 0540  03/16/14 0615 03/16/14 1005 03/17/14 0500 03/18/14 0815 03/19/14 0725  WBC 8.0  < > 10.1 9.3 10.0 12.2* 12.2*  NEUTROABS 5.6  --  QUESTIONABLE RESULTS, RECOMMEND RECOLLECT TO VERIFY  --  7.1 9.0* 9.4*  HGB 8.8*  < > 9.2* 9.2* 8.5* 8.5* 8.6*  HCT 27.0*  < > 28.7* 29.0* 26.9* 26.7* 27.1*  MCV 87.9  < > 87.8 90.6 89.4 88.1 88.9   PLT 87*  < > 114* 117* 110* 130* 144*  < > = values in this interval not displayed.  Cardiac Enzymes: No results found for this basename: CKTOTAL, CKMB, CKMBINDEX, TROPONINI,  in the last 168 hours  BNP: No components found with this basename: POCBNP,   CBG:  Recent Labs Lab 03/18/14 0800 03/18/14 1202 03/18/14 1633 03/18/14 2154 03/19/14 0743  GLUCAP 145* 123* 116* 125* 136*    Microbiology: Results for orders placed during the hospital encounter of February 25, 2014  MRSA PCR SCREENING     Status: Abnormal   Collection Time    02/25/2014  5:27 PM      Result Value Ref Range Status   MRSA by PCR POSITIVE (*) NEGATIVE Final   Comment:            The GeneXpert MRSA Assay (FDA     approved for NASAL specimens     only), is one component of a     comprehensive MRSA colonization     surveillance program. It is not     intended to diagnose MRSA     infection nor to guide or  monitor treatment for     MRSA infections.     RESULT CALLED TO, READ BACK BY AND VERIFIED WITH:     Linda Hedges RN 2154 02/27/2014 A BROWNING  WOUND CULTURE     Status: None   Collection Time    03/16/2014  5:27 PM      Result Value Ref Range Status   Specimen Description WOUND   Final   Special Requests IMMUNOCOMPROMISED LEFT INGUINAL AREA   Final   Gram Stain     Final   Value: NO WBC SEEN     NO SQUAMOUS EPITHELIAL CELLS SEEN     RARE GRAM NEGATIVE RODS     Performed at [facility]   Culture     Final   Value: MULTIPLE ORGANISMS PRESENT, NONE PREDOMINANT     Note: NO STAPHYLOCOCCUS AUREUS ISOLATED NO GROUP A STREP (S.PYOGENES) ISOLATED     Performed at [facility]   Report Status 02/22/2014 FINAL   Final  CULTURE, BLOOD (ROUTINE X 2)     Status: None   Collection Time    02/21/14  5:31 PM      Result Value Ref Range Status   Specimen Description BLOOD LEFT HAND   Final   Special Requests BOTTLES DRAWN AEROBIC ONLY 8CC   Final   Culture  Setup Time     Final   Value: 02/22/2014  02:22     Performed at [facility]   Culture     Final   Value: NO GROWTH 5 DAYS     Performed at [facility]   Report Status 02/28/2014 FINAL   Final  CULTURE, BLOOD (ROUTINE X 2)     Status: None   Collection Time    02/21/14  5:40 PM      Result Value Ref Range Status   Specimen Description BLOOD LEFT HAND   Final   Special Requests BOTTLES DRAWN AEROBIC ONLY 10 CC   Final   Culture  Setup Time     Final   Value: 02/22/2014 02:23     Performed at [facility]   Culture     Final   Value: NO GROWTH 5 DAYS     Performed at [facility]   Report Status 02/28/2014 FINAL   Final  WOUND CULTURE     Status: None   Collection Time    03/16/14 10:12 AM      Result Value Ref Range Status   Specimen Description WOUND LEG LEFT   Final   Special Requests NONE   Final   Gram Stain     Final   Value: RARE WBC PRESENT, PREDOMINANTLY PMN     NO SQUAMOUS EPITHELIAL CELLS SEEN     ABUNDANT GRAM NEGATIVE RODS     MODERATE GRAM POSITIVE COCCI     IN PAIRS   Culture     Final   Value: ABUNDANT ACINETOBACTER CALCOACETICUS/BAUMANNII COMPLEX     Performed at [facility]   Report Status PENDING   Incomplete   Organism ID, Bacteria ACINETOBACTER CALCOACETICUS/BAUMANNII COMPLEX   Final    Coagulation Studies: No results found for this basename: LABPROT, INR,  in the last 72 hours  Urinalysis: No results found for this basename: COLORURINE, APPERANCEUR, LABSPEC, PHURINE, GLUCOSEU, HGBUR, BILIRUBINUR, KETONESUR, PROTEINUR, UROBILINOGEN, NITRITE, LEUKOCYTESUR,  in the last 72 hours    Imaging: No results found.   Medications:   . sodium chloride Stopped (03/15/14  0700)   . bisacodyl  10 mg Rectal Daily  . docusate sodium  200 mg Oral BID  . feeding supplement (NEPRO CARB STEADY)  237 mL Oral BID BM  . heparin subcutaneous  5,000 Units Subcutaneous 3 times per day  . insulin aspart  0-9 Units Subcutaneous TID WC  . midodrine  10 mg Oral TID WC   . multivitamin  1 tablet Oral QHS  . pantoprazole  40 mg Oral Q1200  . polyethylene glycol  17 g Oral Daily  . sodium bicarbonate  1,300 mg Oral TID  . sodium chloride  3 mL Intravenous Q12H   diphenhydrAMINE, guaiFENesin-dextromethorphan, heparin, hydrOXYzine, lactulose, oxyCODONE-acetaminophen, simethicone, sodium chloride, sodium chloride  Assessment/ Plan:  1.ABLA/Anemia of malignancy and CKD:EPO per Heme/onc  1. s/p second blood transfusion  2. Cont to follow H/H 3. 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 Plan per Heme/Onc 3 ESRD: has remained HD-dependent and anuric, presumably due to ischemic ATN without recovery. Will need placement of permcath. Cont with HD 3 x week. 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  6. Vascular access- has RIJ PC but will need AVF/AVG once stabilized.  Disposition is going to a challenge in the face of poor mobility and the need for dialysis treatments. Discussed with Danne Baxter is to going to check with [facility]     LOS: [place] @TODAY @11 :50 AM

## 2014-03-19 NOTE — Progress Notes (Signed)
CARE MANAGEMENT NOTE 03/19/2014  Patient:  Passarella,Lawerance   Account Number:  [id number]  Date Initiated:  02/22/2014  Documentation initiated by:  [facility]  Subjective/Objective Assessment:   AKI - hyperkalemia requiring emergent CRRT.     Action/Plan:   arrange for wound VAC   Anticipated DC Date:  03/20/2014   Anticipated DC Plan:  IP REHAB FACILITY      DC Planning Services  CM consult      PAC Choice  Resumption Of Svcs/PTA Provider   Choice offered to / List presented to:             Status of service:  In process, will continue to follow Medicare Important Message given?   (If response is "NO", the following Medicare IM given date fields will be blank) Date Medicare IM given:   Date Additional Medicare IM given:    Discharge Disposition:    Per UR Regulation:  Reviewed for med. necessity/level of care/duration of stay  If discussed at [place] of Stay Meetings, dates discussed:   03/11/2014    Comments:  ContactBarbaraann Cao Sister [phone]                 Parker,Catherine Friend [phone]                  [name],Lucy Sister [phone]  03/19/2014  [place], [place] [phone] CIR/Barbara continue to monitor status [facility] contacted to review for LTAC [facility] contacted to review for LTAC.  She noted the patient has BCBS they are out of network.  03/12/2014  [place], [place] CIR/Barbara continue to monitor patient status for transfer to CIR.  02/24/2014  [place], [place] [facility]Rickie [phone]  NCM called for f/u regarding wound VAC, requested the following documentation. FAXED [phone]  facesheet, H&P, OP notes, WOC notes.  02-22-14 2:45pm Luz Lex, RNBSN [phone] hyperkalemia in non [facility] patient.  Started on CRRT.  Has been home with [facility] - RN following.  Will need resumption of HH RN on discharge to continue this service.    Talked with patient - lives at  home with GF - Donia Guiles.  Does have HH with [facility] which he would like to continue.  At this time has a wound VAC to groin - changing M-W-F.  VAC form placed on chart.

## 2014-03-19 NOTE — Progress Notes (Signed)
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  As of 03/18/14, pt is ESRD and requiring HD.      PT Comments    Worked with pt. From seated position in [place] chair this am in hopes that he could have a more successful attempt at standing.  He did quite a bit of shifting and boosting practice and had 4 trials of standing.  He remains unable to stand, clearing buttocks only  2-3 inches off surface of chair.  He session successful in regards to how much he was able to begin to  activate trunk musculature for assisting self.Pt. Is working very hard with therapist toward goal of standing.  Will plan same scheduled session for Mon am 5/4 at [place].  Have arranged with charge RN Cybil as Jonelle Sidle is not here today.    Follow Up Recommendations  CIR;Supervision/Assistance - 24 hour     Equipment Recommendations  Rolling walker with 5" wheels    Recommendations for Other Services       Precautions / Restrictions Precautions Precautions: Fall Precaution Comments: morbid obesity Restrictions Weight Bearing Restrictions: No Other Position/Activity Restrictions: pt. withlimited ROM and strength due to mass of L LE    Mobility  Bed Mobility Overal bed mobility:  (not assessed, pt. in bari chair)                Transfers Overall transfer level: Needs assistance Equipment used: 2 person hand held assist Transfers: Sit to/from Stand Sit to Stand: +2 physical assistance;Total assist         General transfer comment: Pt. was in  bari chair upon PT arrival.  Worked extensively on bringing trunk forward in sitting position /activation of core musculature by multiple repetitions of bringing body forward in chair with use of upright poles and manual facilitation from therapists.  Worked on attempted sit to stand from sitting in chair x 4 reps.  Pt. continues to be limited in clearing buttocks from surface of chair.  Had pt. to work on attempting to boost self back into chair by pushing with R LE through bedside chair stabilized by therapist.  Pt.appears fearful of bringing trunk over COM and feels as though he is slipping forward even when it appears he is stable in sitting.  Use of chair seat belt for pt's comfort, loosened appropriately to trial shifting forward.    Ambulation/Gait                 Stairs            Wheelchair Mobility    Modified Rankin (Stroke Patients Only)       Balance                                    Cognition Arousal/Alertness: Awake/alert Behavior During Therapy: WFL for tasks assessed/performed Overall Cognitive Status: Within Functional Limits for tasks assessed  Exercises General Exercises - Lower Extremity Ankle Circles/Pumps: AROM;Both;15 reps;Seated Long Arc Quad: AROM;AAROM;Right;Left;15 reps;Seated    General Comments        Pertinent Vitals/Pain See vitals tab     Home Living                      Prior Function            PT Goals (current goals can now be found in the care plan section) Progress towards PT goals: Progressing toward goals    Frequency  Min 3X/week    PT Plan Current plan remains appropriate    Co-evaluation             End of Session Equipment Utilized During Treatment: Gait belt (sheet roll as gait belt) Activity Tolerance: Patient tolerated treatment well;Patient limited by fatigue Patient left: in chair;with call bell/phone within reach;Other (comment) (seat belt  reaplied in bari chair and chair reclined)     Time: 1638-4536 PT Time Calculation (min): 57 min  Charges:  $Therapeutic Exercise: 8-22 mins $Therapeutic Activity: 38-52 mins                    G Codes:      Ladona Ridgel 03/19/2014, 9:19 AM Gerlean Ren PT Acute Rehab Services [phone] Beeper [phone]

## 2014-03-19 NOTE — Progress Notes (Signed)
Pt is able to place on CPAP mask when ready for bed. Pt encouraged to call RT if needing any assistance.

## 2014-03-19 DEATH — deceased

## 2014-03-20 LAB — GLUCOSE, CAPILLARY
GLUCOSE-CAPILLARY: 119 mg/dL — AB (ref 70–99)
GLUCOSE-CAPILLARY: 175 mg/dL — AB (ref 70–99)
GLUCOSE-CAPILLARY: 165 mg/dL — AB (ref 70–99)
Glucose-Capillary: 137 mg/dL — ABNORMAL HIGH (ref 70–99)

## 2014-03-20 LAB — RENAL FUNCTION PANEL
ALBUMIN: 1.5 g/dL — AB (ref 3.5–5.2)
BUN: 35 mg/dL — AB (ref 6–23)
CO2: 22 mEq/L (ref 19–32)
Calcium: 8.4 mg/dL (ref 8.4–10.5)
Chloride: 89 mEq/L — ABNORMAL LOW (ref 96–112)
Creatinine, Ser: 3.86 mg/dL — ABNORMAL HIGH (ref 0.50–1.35)
GFR calc Af Amer: 19 mL/min — ABNORMAL LOW (ref >90)
GFR calc non Af Amer: 16 mL/min — ABNORMAL LOW (ref >90)
Glucose, Bld: 120 mg/dL — ABNORMAL HIGH (ref 70–99)
PHOSPHORUS: 4.8 mg/dL — AB (ref 2.3–4.6)
POTASSIUM: 3.9 meq/L (ref 3.7–5.3)
Sodium: 135 mEq/L — ABNORMAL LOW (ref 137–147)

## 2014-03-20 NOTE — Progress Notes (Signed)
Pt. States he can place his cpap on himself. RT informed pt. To notify if he needs any assistance. 

## 2014-03-20 NOTE — Progress Notes (Signed)
[facility] PROGRESS NOTE  Douglas [name] ELF:[id number] DOB: September 30, 1956 DOA: 02/27/2014 PCP: [name], MD  Assessment/Plan: Patient is a 58 year old male with history of follicular low-grade non-Hodgkin lymphoma, large left groin lymphadenopathy, diagnosed in 2000 and was on recent chemotherapy, had admission for acute renal failure 3/17- 3/20. He was suspected to have acute renal failure de to HCTZ and ARB.  He had outpatient left-sided inguinal node biopsy after which he has developed a large lymph filled seroma on the left groin requiring wound VAC placement and continuous drainage. Have discussed his case multiple times with general surgery who do not recommend any further surgical intervention. Once he has a rehabilitation bed he could be placed in a rehabilitation as he requires intermittent IV fluid boluses for ongoing fluid loss due to daily lymph drainage from the wound .   He is currently stable from medicine standpoint for discharge to a monitored setting like LTEC;  -he is not stable enough to go SNF or CIR at this time; , case management and social worker have been informed. His long-term prognosis although appears not to good due to underlying lymphoma, ESRD, anasarca and baseline low blood pressures.   SIGNIFICANT EVENTS / STUDIES:  4/3 Hyperkalemia, hyperuricemia, hyperphosphatemia, with ARF requiring emergent CRRT. UA with urate crystals. Renal US normal. No pulmonary edema on CXR. Heparin gtt started due to concern for PE. Started IV vanc for left groin wound.  4/4 ARF thought to be due to ATN from severe sepsis, per oncology not tumor lysis syndrome. No DVT, heparin gtt stopped. 2D-echo normal.  4/5 Continue CRRT, to transition to HD, no renal recovery yet. Hyperuricemia resolved. Dialysis access is a right IJ dialysis catheter  4/6 .Wound vac placed  4/7 -hemodialysis initiated   Assessment/Plan:  1. Acute kidney injury-potentially related to tumor lysis now  ESRD :  - Etiology prerenal from volume loss from her fistula versus severe sepsis from left groin infection. Per Oncology tumor lysis syndrome less likely, Currently undergoing dialysis per renal on a 3 times a week basis. Discussed with renal now likely ESRD.  -per nephrology not a candidate for outpatient HD yet  2. NHL (non-Hodgkin's lymphoma) with follicular low-grade, large left groin lymph adenopathy status post chemotherapy, hyperuricemia -s/p Bendamustine and rituximab 02/03/14  Hyperuricemia has resolved, oncology following (Dr.Magrinat), stable G6PD, LDH levels are around 600 which is twice normal, transfuse PRBCs as needed. CT abdomen for restaging outpatient home along with outpatient followup with oncology post discharge.  3. 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  Since his blood pressures are low, got totaled 2 units of packed RBCs one each on 03/11/2014 and 03/12/2014, no signs of ongoing bleed, post transfusion stable H&H.  4. 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 Hulen Skains on 02/24/2014, patient to be discharged with home health and wound VAC, will follow with Dr. Barkley Bruns in the office post discharge.  - Noted wound growing gram-negative rods was placed on doxycycline + cipro on 02/25/2014 stopped on 03/10/14; monitor temperature curve, wbc borderline stable  5. Intermittent tachycardic and hypotension  Likely due to fluid removal during HD, along with ongoing fluid loss from his left inguinal drain , will supplement with IV fluids as needed, have discussed with surgery, they have no other solution then to continue drainage this time. Per Dr. Barkley Bruns and Hulen Skains no further surgical intervention is required. Steroids were tapered off. Increased  midodrene. Stable random cortisol and post transfusion H&H.  -may need IVF bolus intermittent  6. Type 2 diabetes  mellitus  - Continue sliding scale insulin  7. GERD with history of GI bleed: Continue PPI   Patient i snot stable enough to go SNF or CIR due to ongoing medical issues, dialysis, wound care, severe deconditioning, int remittent hypotension with HD -But he is a good candidate for long term care like LTEC; CM involved   Code Status: full Family Communication:  D/w patient, called updated [name],[name] [phone] (indicate person spoken with, relationship, and if by phone, the number) Disposition Plan: CIR vs LTEC  Consultants:  Surgery  Nephrology  Oncology Procedures:  Dialysis  Wound VAC Antibiotics:  Anti-infectives    Start    Dose/Rate  Route  Frequency  Ordered  Stop    03/11/2014 0953   ceFAZolin (ANCEF) 1-5 GM-% IVPB     02/21/2014 0953  03/12/2014 2159      Comments: [name] : cabinet override         03/03/2014 0953   ceFAZolin (ANCEF) 2-3 GM-% IVPB SOLR     02/24/2014 0953  03/10/2014 2159      Comments: [name] : cabinet override         02/23/2014 0600   [MAR Hold] ceFAZolin (ANCEF) 3 g in dextrose 5 % 50 mL IVPB (On MAR Hold since 02/21/2014 0850)  3 g  160 mL/hr over 30 Minutes  Intravenous  On call  03/07/14 1556  03/10/2014 1021    03/09/2014 0000   ceFAZolin (ANCEF) IVPB 1 g/50 mL premix Status: Discontinued  1 g  100 mL/hr over 30 Minutes  Intravenous  On call  03/07/14 1540  03/07/14 1555      Comments: Send with pt to OR         03/01/14 1800   doxycycline (VIBRA-TABS) tablet 100 mg Status: Discontinued  100 mg  Oral  Every 12 hours  03/01/14 1522  03/10/14 1119    03/01/14 0800   ciprofloxacin (CIPRO) tablet 500 mg Status: Discontinued  500 mg  Oral  Daily with breakfast  02/28/14 1106  03/10/14 1119    02/26/14 0000   doxycycline (VIBRAMYCIN) 100 MG capsule  100 mg  Oral  2 times daily  02/26/14 1153     02/26/14 0000   ciprofloxacin (CIPRO) 500 MG tablet  500 mg  Oral  2 times daily  02/26/14 1153     02/25/14 1100   ciprofloxacin (CIPRO) IVPB 400 mg Status:  Discontinued  400 mg  200 mL/hr over 60 Minutes  Intravenous  Every 12 hours  02/25/14 1047  02/28/14 1106    02/24/14 1830   doxycycline (VIBRAMYCIN) 100 mg in dextrose 5 % 250 mL IVPB Status: Discontinued  100 mg  125 mL/hr over 120 Minutes  Intravenous  Every 12 hours  02/24/14 1756  03/01/14 1521    02/24/14 1800   cefTRIAXone (ROCEPHIN) 1 g in dextrose 5 % 50 mL IVPB Status: Discontinued  1 g  100 mL/hr over 30 Minutes  Intravenous  Every 24 hours  02/24/14 1231  02/24/14 1756    02/24/14 1200   vancomycin (VANCOCIN) 1,500 mg in sodium chloride 0.9 % 250 mL IVPB Status: Discontinued  1,500 mg  250 mL/hr over 60 Minutes  Intravenous  Once  02/23/14 1423  02/24/14 1231    02/20/14 2200   vancomycin (VANCOCIN) 1,500 mg in sodium chloride 0.9 % 500 mL IVPB  Status: Discontinued  1,500 mg  250 mL/hr over 120 Minutes  Intravenous  Every 24 hours  02/21/2014 2119  02/22/14 1116    03/17/2014 2200   vancomycin (VANCOCIN) 2,000 mg in sodium chloride 0.9 % 500 mL IVPB  2,000 mg  250 mL/hr over 120 Minutes  Intravenous  Once  02/24/2014 2119  02/20/14 0005       HPI/Subjective: alert  Objective: Filed Vitals:   03/20/14 0550  BP: 96/62  Pulse: 102  Temp: 98.8 F (37.1 C)  Resp: 17    Intake/Output Summary (Last 24 hours) at 03/20/14 0901 Last data filed at 03/20/14 0500  Gross per 24 hour  Intake    240 ml  Output   1450 ml  Net  -1210 ml   Filed Weights   03/17/14 2138 03/19/14 1348 03/19/14 1656  Weight: 217.3 kg (479 lb 1 oz) 222.5 kg (490 lb 8.4 oz) 221 kg (487 lb 3.5 oz)    Exam:   General:  alert  Cardiovascular: s1,s2 rrr  Respiratory: CTA BL  Abdomen: soft, obese, NT  Musculoskeletal: LE edema   Data Reviewed: Basic Metabolic Panel:  Recent Labs Lab 03/15/14 1030 03/16/14 1005 03/17/14 0500 03/18/14 0815 03/19/14 0725 03/20/14 0635  NA 137  --  133* 132* 131* 135*  K 2.3* 4.3 4.4 4.2 4.2 3.9  CL 92*  --  89* 86* 86* 89*  CO2 28  --  21 20 19 22   GLUCOSE  94  --  138* 139* 143* 120*  BUN 5*  --  49* 41* 48* 35*  CREATININE 0.94  --  4.94* 4.18* 4.75* 3.86*  CALCIUM 8.0*  --  9.1 9.1 9.1 8.4  PHOS 0.9*  --  5.7* 5.4* 5.7* 4.8*   Liver Function Tests:  Recent Labs Lab 03/15/14 1030 03/17/14 0500 03/18/14 0815 03/19/14 0725 03/20/14 0635  ALBUMIN 1.9* 1.6* 1.8* 1.7* 1.5*   No results found for this basename: LIPASE, AMYLASE,  in the last 168 hours No results found for this basename: AMMONIA,  in the last 168 hours CBC:  Recent Labs Lab 03/14/14 0540  03/16/14 0615 03/16/14 1005 03/17/14 0500 03/18/14 0815 03/19/14 0725  WBC 8.0  < > 10.1 9.3 10.0 12.2* 12.2*  NEUTROABS 5.6  --  QUESTIONABLE RESULTS, RECOMMEND RECOLLECT TO VERIFY  --  7.1 9.0* 9.4*  HGB 8.8*  < > 9.2* 9.2* 8.5* 8.5* 8.6*  HCT 27.0*  < > 28.7* 29.0* 26.9* 26.7* 27.1*  MCV 87.9  < > 87.8 90.6 89.4 88.1 88.9  PLT 87*  < > 114* 117* 110* 130* 144*  < > = values in this interval not displayed. Cardiac Enzymes: No results found for this basename: CKTOTAL, CKMB, CKMBINDEX, TROPONINI,  in the last 168 hours BNP (last 3 results)  Recent Labs  03/16/2014 1220  PROBNP 937.1*   CBG:  Recent Labs Lab 03/19/14 0743 03/19/14 1153 03/19/14 1818 03/19/14 2035 03/20/14 0736  GLUCAP 136* 125* 96 98 119*    Recent Results (from the past 240 hour(s))  WOUND CULTURE     Status: None   Collection Time    03/16/14 10:12 AM      Result Value Ref Range Status   Specimen Description WOUND LEG LEFT   Final   Special Requests NONE   Final   Gram Stain     Final   Value: RARE WBC PRESENT, PREDOMINANTLY PMN     NO SQUAMOUS EPITHELIAL CELLS SEEN  ABUNDANT GRAM NEGATIVE RODS     MODERATE GRAM POSITIVE COCCI     IN PAIRS   Culture     Final   Value: ABUNDANT ACINETOBACTER CALCOACETICUS/BAUMANNII COMPLEX     Note: COLISTIN SENSITIVE 0.38ug/mL     ABUNDANT STAPHYLOCOCCUS AUREUS     Note: RIFAMPIN AND GENTAMICIN SHOULD NOT BE USED AS SINGLE DRUGS FOR TREATMENT OF  STAPH INFECTIONS.     Performed at [facility]   Report Status PENDING   Incomplete   Organism ID, Bacteria ACINETOBACTER CALCOACETICUS/BAUMANNII COMPLEX   Final     Studies: No results found.  Scheduled Meds: . bisacodyl  10 mg Rectal Daily  . docusate sodium  200 mg Oral BID  . feeding supplement (NEPRO CARB STEADY)  237 mL Oral BID BM  . heparin subcutaneous  5,000 Units Subcutaneous 3 times per day  . insulin aspart  0-9 Units Subcutaneous TID WC  . midodrine  10 mg Oral TID WC  . multivitamin  1 tablet Oral QHS  . pantoprazole  40 mg Oral Q1200  . polyethylene glycol  17 g Oral Daily  . sodium bicarbonate  1,300 mg Oral TID  . sodium chloride  3 mL Intravenous Q12H   Continuous Infusions: . sodium chloride Stopped (03/15/14 0700)    Active Problems:   NHL (non-Hodgkin's lymphoma)   HTN (hypertension)   Dyslipidemia   Anemia of chronic disease   Morbid obesity   Lower GI bleed   Diabetes mellitus   Tumor lysis syndrome, possibility of   Uremia   SOB (shortness of breath) DDX = CHF vs. PE   Acidosis    Time spent: >35 minutes     [name]  [facility] Pager [phone]. If 7PM-7AM, please contact night-coverage at [email or web address], password [facility] 03/20/2014, 9:01 AM  LOS: 29 days

## 2014-03-20 NOTE — Progress Notes (Signed)
[facility] ROUNDING NOTE   Subjective:   Interval History: no complaints this morning   Objective:  Vital signs in last 24 hours:  Temp:  [97.7 F (36.5 C)-98.8 F (37.1 C)] 98.8 F (37.1 C) (05/02 0550) Pulse Rate:  [78-102] 102 (05/02 0550) Resp:  [16-20] 17 (05/02 0550) BP: (79-126)/(44-77) 96/62 mmHg (05/02 0550) SpO2:  [93 %-97 %] 95 % (05/02 0550) Weight:  [221 kg (487 lb 3.5 oz)-222.5 kg (490 lb 8.4 oz)] 221 kg (487 lb 3.5 oz) (05/01 1656)  Weight change:  Filed Weights   03/17/14 2138 03/19/14 1348 03/19/14 1656  Weight: 217.3 kg (479 lb 1 oz) 222.5 kg (490 lb 8.4 oz) 221 kg (487 lb 3.5 oz)    Intake/Output: I/O last 3 completed shifts: In: 360 [P.O.:360] Out: 1450 [Other:1450]   Intake/Output this shift:     CVS- RRR RS- CTA ABD- BS present soft non-distended EXT-  +brawny edema, wound vac on left thigh    Basic Metabolic Panel:  Recent Labs Lab 03/15/14 1030 03/16/14 1005 03/17/14 0500 03/18/14 0815 03/19/14 0725 03/20/14 0635  NA 137  --  133* 132* 131* 135*  K 2.3* 4.3 4.4 4.2 4.2 3.9  CL 92*  --  89* 86* 86* 89*  CO2 28  --  21 20 19 22   GLUCOSE 94  --  138* 139* 143* 120*  BUN 5*  --  49* 41* 48* 35*  CREATININE 0.94  --  4.94* 4.18* 4.75* 3.86*  CALCIUM 8.0*  --  9.1 9.1 9.1 8.4  PHOS 0.9*  --  5.7* 5.4* 5.7* 4.8*    Liver Function Tests:  Recent Labs Lab 03/15/14 1030 03/17/14 0500 03/18/14 0815 03/19/14 0725 03/20/14 0635  ALBUMIN 1.9* 1.6* 1.8* 1.7* 1.5*   No results found for this basename: LIPASE, AMYLASE,  in the last 168 hours No results found for this basename: AMMONIA,  in the last 168 hours  CBC:  Recent Labs Lab 03/14/14 0540  03/16/14 0615 03/16/14 1005 03/17/14 0500 03/18/14 0815 03/19/14 0725  WBC 8.0  < > 10.1 9.3 10.0 12.2* 12.2*  NEUTROABS 5.6  --  QUESTIONABLE RESULTS, RECOMMEND RECOLLECT TO VERIFY  --  7.1 9.0* 9.4*  HGB 8.8*  < > 9.2* 9.2* 8.5* 8.5* 8.6*  HCT 27.0*  < > 28.7*  29.0* 26.9* 26.7* 27.1*  MCV 87.9  < > 87.8 90.6 89.4 88.1 88.9  PLT 87*  < > 114* 117* 110* 130* 144*  < > = values in this interval not displayed.  Cardiac Enzymes: No results found for this basename: CKTOTAL, CKMB, CKMBINDEX, TROPONINI,  in the last 168 hours  BNP: No components found with this basename: POCBNP,   CBG:  Recent Labs Lab 03/19/14 0743 03/19/14 1153 03/19/14 1818 03/19/14 2035 03/20/14 0736  GLUCAP 136* 125* 96 98 119*    Microbiology: Results for orders placed during the hospital encounter of 03/07/2014  MRSA PCR SCREENING     Status: Abnormal   Collection Time    03/01/2014  5:27 PM      Result Value Ref Range Status   MRSA by PCR POSITIVE (*) NEGATIVE Final   Comment:            The GeneXpert MRSA Assay (FDA     approved for NASAL specimens     only), is one component of a     comprehensive MRSA colonization     surveillance program. It is not     intended  to diagnose MRSA     infection nor to guide or     monitor treatment for     MRSA infections.     RESULT CALLED TO, READ BACK BY AND VERIFIED WITH:     Linda Hedges RN 2154 03/07/2014 A BROWNING  WOUND CULTURE     Status: None   Collection Time    02/18/2014  5:27 PM      Result Value Ref Range Status   Specimen Description WOUND   Final   Special Requests IMMUNOCOMPROMISED LEFT INGUINAL AREA   Final   Gram Stain     Final   Value: NO WBC SEEN     NO SQUAMOUS EPITHELIAL CELLS SEEN     RARE GRAM NEGATIVE RODS     Performed at [facility]   Culture     Final   Value: MULTIPLE ORGANISMS PRESENT, NONE PREDOMINANT     Note: NO STAPHYLOCOCCUS AUREUS ISOLATED NO GROUP A STREP (S.PYOGENES) ISOLATED     Performed at [facility]   Report Status 02/22/2014 FINAL   Final  CULTURE, BLOOD (ROUTINE X 2)     Status: None   Collection Time    02/21/14  5:31 PM      Result Value Ref Range Status   Specimen Description BLOOD LEFT HAND   Final   Special Requests BOTTLES DRAWN AEROBIC ONLY 8CC    Final   Culture  Setup Time     Final   Value: 02/22/2014 02:22     Performed at [facility]   Culture     Final   Value: NO GROWTH 5 DAYS     Performed at [facility]   Report Status 02/28/2014 FINAL   Final  CULTURE, BLOOD (ROUTINE X 2)     Status: None   Collection Time    02/21/14  5:40 PM      Result Value Ref Range Status   Specimen Description BLOOD LEFT HAND   Final   Special Requests BOTTLES DRAWN AEROBIC ONLY 10 CC   Final   Culture  Setup Time     Final   Value: 02/22/2014 02:23     Performed at [facility]   Culture     Final   Value: NO GROWTH 5 DAYS     Performed at [facility]   Report Status 02/28/2014 FINAL   Final  WOUND CULTURE     Status: None   Collection Time    03/16/14 10:12 AM      Result Value Ref Range Status   Specimen Description WOUND LEG LEFT   Final   Special Requests NONE   Final   Gram Stain     Final   Value: RARE WBC PRESENT, PREDOMINANTLY PMN     NO SQUAMOUS EPITHELIAL CELLS SEEN     ABUNDANT GRAM NEGATIVE RODS     MODERATE GRAM POSITIVE COCCI     IN PAIRS   Culture     Final   Value: ABUNDANT ACINETOBACTER CALCOACETICUS/BAUMANNII COMPLEX     Note: COLISTIN SENSITIVE 0.38ug/mL     ABUNDANT STAPHYLOCOCCUS AUREUS     Note: RIFAMPIN AND GENTAMICIN SHOULD NOT BE USED AS SINGLE DRUGS FOR TREATMENT OF STAPH INFECTIONS.     Performed at [facility]   Report Status PENDING   Incomplete   Organism ID, Bacteria ACINETOBACTER CALCOACETICUS/BAUMANNII COMPLEX   Final    Coagulation Studies: No results found for this basename:  LABPROT, INR,  in the last 72 hours  Urinalysis: No results found for this basename: COLORURINE, APPERANCEUR, LABSPEC, PHURINE, GLUCOSEU, HGBUR, BILIRUBINUR, KETONESUR, PROTEINUR, UROBILINOGEN, NITRITE, LEUKOCYTESUR,  in the last 72 hours    Imaging: No results found.   Medications:   . sodium chloride Stopped (03/15/14 0700)   . bisacodyl  10 mg Rectal Daily  .  docusate sodium  200 mg Oral BID  . feeding supplement (NEPRO CARB STEADY)  237 mL Oral BID BM  . heparin subcutaneous  5,000 Units Subcutaneous 3 times per day  . insulin aspart  0-9 Units Subcutaneous TID WC  . midodrine  10 mg Oral TID WC  . multivitamin  1 tablet Oral QHS  . pantoprazole  40 mg Oral Q1200  . polyethylene glycol  17 g Oral Daily  . sodium bicarbonate  1,300 mg Oral TID  . sodium chloride  3 mL Intravenous Q12H   diphenhydrAMINE, guaiFENesin-dextromethorphan, heparin, hydrOXYzine, lactulose, oxyCODONE-acetaminophen, simethicone, sodium chloride, sodium chloride  Assessment/ Plan:  1.ABLA/Anemia of malignancy and CKD:EPO per Heme/onc  1. s/p second blood transfusion  2. Cont to follow H/H 3. 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 Plan per Heme/Onc 3 ESRD: has remained HD-dependent and anuric, presumably due to ischemic ATN without recovery. Will need placement of permanent access at some point. Cont with HD 3 x week. 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  6. Vascular access- has RIJ PC but will need AVF/AVG once stabilized.  Disposition is going to a challenge in the face of poor mobility and the need for dialysis treatments. Discussed with Danne Baxter was to going to check with [facility]      LOS: [place] @TODAY @9 :50 AM

## 2014-03-21 ENCOUNTER — Inpatient Hospital Stay (HOSPITAL_COMMUNITY): Payer: BC Managed Care – PPO

## 2014-03-21 DIAGNOSIS — C8595 Non-Hodgkin lymphoma, unspecified, lymph nodes of inguinal region and lower limb: Secondary | ICD-10-CM

## 2014-03-21 DIAGNOSIS — I959 Hypotension, unspecified: Secondary | ICD-10-CM | POA: Diagnosis not present

## 2014-03-21 DIAGNOSIS — G4733 Obstructive sleep apnea (adult) (pediatric): Secondary | ICD-10-CM | POA: Diagnosis present

## 2014-03-21 DIAGNOSIS — S31104A Unspecified open wound of abdominal wall, left lower quadrant without penetration into peritoneal cavity, initial encounter: Secondary | ICD-10-CM | POA: Diagnosis present

## 2014-03-21 LAB — RENAL FUNCTION PANEL
ALBUMIN: 1.7 g/dL — AB (ref 3.5–5.2)
BUN: 44 mg/dL — AB (ref 6–23)
CHLORIDE: 85 meq/L — AB (ref 96–112)
CO2: 19 mEq/L (ref 19–32)
Calcium: 8.8 mg/dL (ref 8.4–10.5)
Creatinine, Ser: 4.76 mg/dL — ABNORMAL HIGH (ref 0.50–1.35)
GFR calc Af Amer: 14 mL/min — ABNORMAL LOW (ref >90)
GFR calc non Af Amer: 12 mL/min — ABNORMAL LOW (ref >90)
Glucose, Bld: 140 mg/dL — ABNORMAL HIGH (ref 70–99)
PHOSPHORUS: 6 mg/dL — AB (ref 2.3–4.6)
Potassium: 4.2 mEq/L (ref 3.7–5.3)
SODIUM: 132 meq/L — AB (ref 137–147)

## 2014-03-21 LAB — GLUCOSE, CAPILLARY
Glucose-Capillary: 132 mg/dL — ABNORMAL HIGH (ref 70–99)
Glucose-Capillary: 141 mg/dL — ABNORMAL HIGH (ref 70–99)

## 2014-03-21 MED ORDER — LACTULOSE 10 GM/15ML PO SOLN
30.0000 g | Freq: Two times a day (BID) | ORAL | Status: DC
  Administered 2014-03-22 – 2014-03-27 (×8): 30 g via ORAL
  Filled 2014-03-21 (×13): qty 45

## 2014-03-21 MED ORDER — SENNA 8.6 MG PO TABS
2.0000 | ORAL_TABLET | Freq: Every day | ORAL | Status: DC
  Administered 2014-03-21 – 2014-03-26 (×5): 17.2 mg via ORAL
  Filled 2014-03-21 (×6): qty 2

## 2014-03-21 NOTE — Progress Notes (Signed)
CRITICAL VALUE ALERT  Critical value received:  Wound culture positive for acinetobacter and MRSA  Date of notification:  03/21/14   Time of notification:  10:02  Critical value read back:yes  Nurse who received alert:  Virgilio Frees   MD notified (1st page):  Dr. Daleen Bo  Time of first page:  10:05  Responding MD:  Dr. Daleen Bo  Time MD responded:  10:07

## 2014-03-21 NOTE — Progress Notes (Signed)
Patient CBG 140 at 12:30

## 2014-03-21 NOTE — Consult Note (Signed)
[facility] for Infectious Disease    Date of Admission:  02/24/2014           Reason for Consult: Intermittent hypotension in the setting of chronic left groin wound    Referring Physician: Dr. Rowe Clack Primary Care Physician: Dr. Dorena Bodo  Active Problems:   Hypotension   Non-healing left groin open wound   NHL (non-Hodgkin's lymphoma)   HTN (hypertension)   Dyslipidemia   Anemia of chronic disease   Morbid obesity   Lower GI bleed   Diabetes mellitus   AKI (acute kidney injury)   Obstructive sleep apnea   . bisacodyl  10 mg Rectal Daily  . docusate sodium  200 mg Oral BID  . feeding supplement (NEPRO CARB STEADY)  237 mL Oral BID BM  . heparin subcutaneous  5,000 Units Subcutaneous 3 times per day  . insulin aspart  0-9 Units Subcutaneous TID WC  . lactulose  30 g Oral BID  . midodrine  10 mg Oral TID WC  . multivitamin  1 tablet Oral QHS  . pantoprazole  40 mg Oral Q1200  . polyethylene glycol  17 g Oral Daily  . senna  2 tablet Oral Daily  . sodium bicarbonate  1,300 mg Oral TID  . sodium chloride  3 mL Intravenous Q12H    Recommendations: 1. Observe off of antibiotics for now 2. Attempt to reexamine the wound with wound care nurse ([place] RN, [phone]) 3. Consider repeat CT scan of left groin and thigh 4. Requested tightness likely susceptibility to be performed on Acinetobacter isolate  Assessment: I am not certain that active wound infection is the source of his recent hypotension. Although it sounds like he had worsening wound drainage on April 28, it appears that that has improved with wet to dry dressing changes only. MRSA and Acinetobacter may be insignificant colonizing organisms. I would prefer to hold off on antibiotic therapy at this time and see if we can reexamine the wound in the next 48 hours with the wound care nurse. If there is ongoing concern for active infection it may be worthwhile repeating his CT scan to make  certain that there is not some deep, undrained abscess before starting antibiotic therapy.   HPI: Douglas [name] is a 58 y.o. male with history of non-Hodgkin's lymphoma who underwent core biopsies of an enlarging left groin mass last December. The biopsies revealed lymphoma again and he started chemotherapy. Since the biopsy he has had an open wound in his left groin with lymphatic drainage. He has been treated intermittently with antibiotics initially with doxycycline alone, then doxycycline and ciprofloxacin, completing his last round of therapy on April 22. He was rehospitalized on April 3 with worsening renal insufficiency, volume overload and hyperkalemia and had to start emergent hemodialysis. The wound VAC was placed. On April 28 more brown, foul-smelling drainage was noted and the VAC dressing was changed to wet to dry dressing changes twice daily. Swab cultures obtained that day have grown MRSA and multidrug resistant Acinetobacter. He has had some intermittent hypotension recently and I was consulted because there was concern that persistent wound infection might be the source. He has not had any fever. He has not noted any increased pain or swelling in his groin. He has not been seen by the wound care nurse since April 28 internal surgery signed off. His current nurse states that his wound is looking better. He's had no foul  or dark drainage recently and a total portion of the wound seems to be decreasing in size.   Review of Systems: Constitutional: negative Eyes: negative Ears, nose, mouth, throat, and face: negative Respiratory: negative Cardiovascular: negative Gastrointestinal: negative Genitourinary:negative  Past Medical History  Diagnosis Date  . Non Hodgkin's lymphoma January 2009  . Diabetes mellitus without complication   . Hypertension   . Sleep apnea   . Lymphadenopathy, inguinal     left    History  Substance Use Topics  . Smoking status: Never Smoker   .  Smokeless tobacco: Never Used  . Alcohol Use: No    Family History  Problem Relation Age of Onset  . CAD Mother 93    Died at age 82  . CAD Father    No Known Allergies  OBJECTIVE: Blood pressure 92/53, pulse 108, temperature 98.6 F (37 C), temperature source Oral, resp. rate 21, height 5' 10.08" (1.78 m), weight 487 lb 3.5 oz (221 kg), SpO2 93.00%. General: He is morbidly obese he is alert, cooperative and in no distress. He has a right IJ hemodialysis catheter and a saline lock IV and his right upper arm Skin: No rash Lungs: Clear Cor: Distant heart sounds but regular S1 and S2 with no murmur Abdomen: Obese, soft and nontender Anterior left groin wound has a central deep tunnel that measures about 7.5 cm deep. He has some serosanguineous drainage on the gauze dressing. There is no over. There is no surrounding cellulitis. He has marked lymphedema and swelling of his entire left thigh which he and his nurse state is unchanged. . Lab Results Lab Results  Component Value Date   WBC 12.2* 03/19/2014   HGB 8.6* 03/19/2014   HCT 27.1* 03/19/2014   MCV 88.9 03/19/2014   PLT 144* 03/19/2014    Lab Results  Component Value Date   CREATININE 3.86* 03/20/2014   BUN 35* 03/20/2014   NA 135* 03/20/2014   K 3.9 03/20/2014   CL 89* 03/20/2014   CO2 22 03/20/2014    Lab Results  Component Value Date   ALT 11 02/24/2014   AST 33 02/24/2014   ALKPHOS 85 02/24/2014   BILITOT 0.3 02/24/2014     Microbiology: Recent Results (from the past 240 hour(s))  WOUND CULTURE     Status: None   Collection Time    03/16/14 10:12 AM      Result Value Ref Range Status   Specimen Description WOUND LEG LEFT   Final   Special Requests NONE   Final   Gram Stain     Final   Value: RARE WBC PRESENT, PREDOMINANTLY PMN     NO SQUAMOUS EPITHELIAL CELLS SEEN     ABUNDANT GRAM NEGATIVE RODS     MODERATE GRAM POSITIVE COCCI     IN PAIRS   Culture     Final   Value: ABUNDANT ACINETOBACTER CALCOACETICUS/BAUMANNII COMPLEX      Note: COLISTIN SENSITIVE 0.38ug/mL     ABUNDANT STAPHYLOCOCCUS AUREUS     Note: RIFAMPIN AND GENTAMICIN SHOULD NOT BE USED AS SINGLE DRUGS FOR TREATMENT OF STAPH INFECTIONS.     Performed at [facility]   Report Status PENDING   Incomplete   Organism ID, Bacteria ACINETOBACTER CALCOACETICUS/BAUMANNII COMPLEX   Final    Michel Bickers, MD [facility] [phone] pager   [phone] cell 03/21/2014, 12:47 PM

## 2014-03-21 NOTE — Progress Notes (Signed)
[facility] ROUNDING NOTE   Subjective:   Interval History:no complaints   Objective:  Vital signs in last 24 hours:  Temp:  [98.1 F (36.7 C)-98.6 F (37 C)] 98.6 F (37 C) (05/03 0900) Pulse Rate:  [70-110] 108 (05/03 0900) Resp:  [20-22] 21 (05/03 0900) BP: (92-100)/(46-64) 92/53 mmHg (05/03 0900) SpO2:  [91 %-97 %] 93 % (05/03 0900)  Weight change:  Filed Weights   03/17/14 2138 03/19/14 1348 03/19/14 1656  Weight: 217.3 kg (479 lb 1 oz) 222.5 kg (490 lb 8.4 oz) 221 kg (487 lb 3.5 oz)    Intake/Output: I/O last 3 completed shifts: In: 360 [P.O.:360] Out: -    Intake/Output this shift:    CVS- RRR  RS- CTA  ABD- BS present soft non-distended  EXT- +brawny edema, wound vac on left thigh    Basic Metabolic Panel:  Recent Labs Lab 03/15/14 1030 03/16/14 1005 03/17/14 0500 03/18/14 0815 03/19/14 0725 03/20/14 0635  NA 137  --  133* 132* 131* 135*  K 2.3* 4.3 4.4 4.2 4.2 3.9  CL 92*  --  89* 86* 86* 89*  CO2 28  --  21 20 19 22   GLUCOSE 94  --  138* 139* 143* 120*  BUN 5*  --  49* 41* 48* 35*  CREATININE 0.94  --  4.94* 4.18* 4.75* 3.86*  CALCIUM 8.0*  --  9.1 9.1 9.1 8.4  PHOS 0.9*  --  5.7* 5.4* 5.7* 4.8*    Liver Function Tests:  Recent Labs Lab 03/15/14 1030 03/17/14 0500 03/18/14 0815 03/19/14 0725 03/20/14 0635  ALBUMIN 1.9* 1.6* 1.8* 1.7* 1.5*   No results found for this basename: LIPASE, AMYLASE,  in the last 168 hours No results found for this basename: AMMONIA,  in the last 168 hours  CBC:  Recent Labs Lab 03/16/14 0615 03/16/14 1005 03/17/14 0500 03/18/14 0815 03/19/14 0725  WBC 10.1 9.3 10.0 12.2* 12.2*  NEUTROABS QUESTIONABLE RESULTS, RECOMMEND RECOLLECT TO VERIFY  --  7.1 9.0* 9.4*  HGB 9.2* 9.2* 8.5* 8.5* 8.6*  HCT 28.7* 29.0* 26.9* 26.7* 27.1*  MCV 87.8 90.6 89.4 88.1 88.9  PLT 114* 117* 110* 130* 144*    Cardiac Enzymes: No results found for this basename: CKTOTAL, CKMB, CKMBINDEX, TROPONINI,  in  the last 168 hours  BNP: No components found with this basename: POCBNP,   CBG:  Recent Labs Lab 03/20/14 0736 03/20/14 1333 03/20/14 1633 03/20/14 2146 03/21/14 0754  GLUCAP 119* 175* 165* 137* 132*    Microbiology: Results for orders placed during the hospital encounter of 03/08/2014  MRSA PCR SCREENING     Status: Abnormal   Collection Time    02/22/2014  5:27 PM      Result Value Ref Range Status   MRSA by PCR POSITIVE (*) NEGATIVE Final   Comment:            The GeneXpert MRSA Assay (FDA     approved for NASAL specimens     only), is one component of a     comprehensive MRSA colonization     surveillance program. It is not     intended to diagnose MRSA     infection nor to guide or     monitor treatment for     MRSA infections.     RESULT CALLED TO, READ BACK BY AND VERIFIED WITH:     Linda Hedges RN [id number] 02/22/2014 A BROWNING  WOUND CULTURE     Status: None  Collection Time    03/11/2014  5:27 PM      Result Value Ref Range Status   Specimen Description WOUND   Final   Special Requests IMMUNOCOMPROMISED LEFT INGUINAL AREA   Final   Gram Stain     Final   Value: NO WBC SEEN     NO SQUAMOUS EPITHELIAL CELLS SEEN     RARE GRAM NEGATIVE RODS     Performed at [facility]   Culture     Final   Value: MULTIPLE ORGANISMS PRESENT, NONE PREDOMINANT     Note: NO STAPHYLOCOCCUS AUREUS ISOLATED NO GROUP A STREP (S.PYOGENES) ISOLATED     Performed at [facility]   Report Status 02/22/2014 FINAL   Final  CULTURE, BLOOD (ROUTINE X 2)     Status: None   Collection Time    02/21/14  5:31 PM      Result Value Ref Range Status   Specimen Description BLOOD LEFT HAND   Final   Special Requests BOTTLES DRAWN AEROBIC ONLY 8CC   Final   Culture  Setup Time     Final   Value: 02/22/2014 02:22     Performed at [facility]   Culture     Final   Value: NO GROWTH 5 DAYS     Performed at [facility]   Report Status 02/28/2014 FINAL   Final  CULTURE,  BLOOD (ROUTINE X 2)     Status: None   Collection Time    02/21/14  5:40 PM      Result Value Ref Range Status   Specimen Description BLOOD LEFT HAND   Final   Special Requests BOTTLES DRAWN AEROBIC ONLY 10 CC   Final   Culture  Setup Time     Final   Value: 02/22/2014 02:23     Performed at [facility]   Culture     Final   Value: NO GROWTH 5 DAYS     Performed at [facility]   Report Status 02/28/2014 FINAL   Final  WOUND CULTURE     Status: None   Collection Time    03/16/14 10:12 AM      Result Value Ref Range Status   Specimen Description WOUND LEG LEFT   Final   Special Requests NONE   Final   Gram Stain     Final   Value: RARE WBC PRESENT, PREDOMINANTLY PMN     NO SQUAMOUS EPITHELIAL CELLS SEEN     ABUNDANT GRAM NEGATIVE RODS     MODERATE GRAM POSITIVE COCCI     IN PAIRS   Culture     Final   Value: ABUNDANT ACINETOBACTER CALCOACETICUS/BAUMANNII COMPLEX     Note: COLISTIN SENSITIVE 0.38ug/mL     ABUNDANT STAPHYLOCOCCUS AUREUS     Note: RIFAMPIN AND GENTAMICIN SHOULD NOT BE USED AS SINGLE DRUGS FOR TREATMENT OF STAPH INFECTIONS.     Performed at [facility]   Report Status PENDING   Incomplete   Organism ID, Bacteria ACINETOBACTER CALCOACETICUS/BAUMANNII COMPLEX   Final    Coagulation Studies: No results found for this basename: LABPROT, INR,  in the last 72 hours  Urinalysis: No results found for this basename: COLORURINE, APPERANCEUR, LABSPEC, PHURINE, GLUCOSEU, HGBUR, BILIRUBINUR, KETONESUR, PROTEINUR, UROBILINOGEN, NITRITE, LEUKOCYTESUR,  in the last 72 hours    Imaging: No results found.   Medications:   . sodium chloride Stopped (03/15/14 0700)   . bisacodyl  10 mg Rectal  Daily  . docusate sodium  200 mg Oral BID  . feeding supplement (NEPRO CARB STEADY)  237 mL Oral BID BM  . heparin subcutaneous  5,000 Units Subcutaneous 3 times per day  . insulin aspart  0-9 Units Subcutaneous TID WC  . lactulose  30 g Oral BID  .  midodrine  10 mg Oral TID WC  . multivitamin  1 tablet Oral QHS  . pantoprazole  40 mg Oral Q1200  . polyethylene glycol  17 g Oral Daily  . senna  2 tablet Oral Daily  . sodium bicarbonate  1,300 mg Oral TID  . sodium chloride  3 mL Intravenous Q12H   diphenhydrAMINE, guaiFENesin-dextromethorphan, heparin, hydrOXYzine, oxyCODONE-acetaminophen, simethicone, sodium chloride, sodium chloride  Assessment/ Plan:  1.ABLA/Anemia of malignancy and CKD:EPO per Heme/onc  1. s/p second blood transfusion  2. Cont to follow H/H 3. 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 Plan per Heme/Onc 3 ESRD: has remained HD-dependent and anuric, presumably due to ischemic ATN without recovery. Will need placement of permanent access at some point. Cont with HD 3 x week. 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  6. Vascular access- has RIJ PC but will need AVF/AVG once stabilized.  Disposition is going to a challenge in the face of poor mobility and the need for dialysis treatments. Discussed with Danne Baxter was to going to check with [facility]  Nothing new to add although blood pressure remains low. Requires midodrine for support   LOS: [place] @TODAY @11 :50 AM

## 2014-03-21 NOTE — Progress Notes (Signed)
Patient SpO2 88% on room air.  Initiated 2L of oxygen via nasal cannula.  SpO2 resolved to 94%.  Notified Dr. Daleen Bo.  Will continue to monitor.

## 2014-03-21 NOTE — Progress Notes (Addendum)
[facility] PROGRESS NOTE  Douglas [name] DUK:[id number] DOB: 1955-11-25 DOA: 03/01/14 PCP: [name], MD  Assessment/Plan: Patient is a 58 year old male with history of follicular low-grade non-Hodgkin lymphoma, large left groin lymphadenopathy, diagnosed in 2000 and was on recent chemotherapy, had admission for acute renal failure 3/17- 3/20. He was suspected to have acute renal failure de to HCTZ and ARB.  He had outpatient left-sided inguinal node biopsy after which he has developed a large lymph filled seroma on the left groin requiring wound VAC placement and continuous drainage. Have discussed his case multiple times with general surgery who do not recommend any further surgical intervention. Once he has a rehabilitation bed he could be placed in a rehabilitation as he requires intermittent IV fluid boluses for ongoing fluid loss due to daily lymph drainage from the wound .   He is currently stable from medicine standpoint for discharge to a monitored setting like LTEC;  -he is not stable enough to go SNF or CIR at this time; , case management and social worker have been informed. His long-term prognosis although appears not to good due to underlying lymphoma, ESRD, anasarca and baseline low blood pressures.   SIGNIFICANT EVENTS / STUDIES:  4/3 Hyperkalemia, hyperuricemia, hyperphosphatemia, with ARF requiring emergent CRRT. UA with urate crystals. Renal US normal. No pulmonary edema on CXR. Heparin gtt started due to concern for PE. Started IV vanc for left groin wound.  4/4 ARF thought to be due to ATN from severe sepsis, per oncology not tumor lysis syndrome. No DVT, heparin gtt stopped. 2D-echo normal.  4/5 Continue CRRT, to transition to HD, no renal recovery yet. Hyperuricemia resolved. Dialysis access is a right IJ dialysis catheter  4/6 .Wound vac placed  4/7 -hemodialysis initiated   Assessment/Plan:  1. Acute kidney injury-potentially related to tumor lysis now  ESRD :  - Etiology prerenal from volume loss from her fistula versus severe sepsis from left groin infection. Per Oncology tumor lysis syndrome less likely, Currently undergoing dialysis per renal on a 3 times a week basis. Discussed with renal now likely ESRD.  -per nephrology not a candidate for outpatient HD yet  2. NHL (non-Hodgkin's lymphoma) with follicular low-grade, large left groin lymph adenopathy status post chemotherapy, hyperuricemia -s/p Bendamustine and rituximab 02/03/14  Hyperuricemia has resolved, oncology following (Dr.Magrinat), stable G6PD, LDH levels are around 600 which is twice normal, transfuse PRBCs as needed. CT abdomen for restaging outpatient home along with outpatient followup with oncology post discharge.  3. 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  Since his blood pressures are low, got totaled 2 units of packed RBCs one each on 03/11/2014 and 03/12/2014, no signs of ongoing bleed, post transfusion stable H&H.  4. 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 Hulen Skains on 02/24/2014, patient to be discharged with home health and wound VAC, will follow with Dr. Barkley Bruns in the office post discharge.  - Noted wound growing gram-negative rods was placed on doxycycline + cipro on 02/25/2014 stopped on 03/10/14;  -wound c/s (4/28): ACINETOBACTER CALCOACETICUS/BAUMANNII asked ID evaluation w/ need atx treatment with intermittent hypotension, mild leukocytoses   5. Intermittent tachycardic and hypotension  Likely due to fluid removal during HD, along with ongoing fluid loss from his left inguinal drain , will supplement with IV fluids as needed, have discussed with surgery, they have no other solution then to continue drainage this time. Per Dr. Barkley Bruns  and Wyatt no further surgical intervention is required. Steroids were tapered off. Increased midodrene.  Stable random cortisol and post transfusion H&H.  -may need IVF bolus intermittent  6. Type 2 diabetes mellitus  - Continue sliding scale insulin  7. GERD with history of GI bleed: Continue PPI  -mild constipation; added bowel regimen; monitor   Patient i snot stable enough to go SNF or CIR due to ongoing medical issues, dialysis, wound care, severe deconditioning, intermittent hypotension with HD -But he is a good candidate for long term care like LTEC; CM involved   Code Status: full Family Communication:  D/w patient, called updated [name],[name] [phone] (indicate person spoken with, relationship, and if by phone, the number) Disposition Plan: CIR vs LTEC  Consultants:  Surgery  Nephrology  Oncology Procedures:  Dialysis  Wound VAC Antibiotics:  Anti-infectives    Start    Dose/Rate  Route  Frequency  Ordered  Stop    03/25/2014 0953   ceFAZolin (ANCEF) 1-5 GM-% IVPB     03-25-14 0953  2014/03/25 2159      Comments: [name] : cabinet override         03/25/2014 0953   ceFAZolin (ANCEF) 2-3 GM-% IVPB SOLR     2014/03/25 0953  03/25/2014 2159      Comments: [name] : cabinet override         03-25-2014 0600   [MAR Hold] ceFAZolin (ANCEF) 3 g in dextrose 5 % 50 mL IVPB (On MAR Hold since Mar 25, 2014 0850)  3 g  160 mL/hr over 30 Minutes  Intravenous  On call  03/07/14 1556  03-25-2014 1021    2014/03/25 0000   ceFAZolin (ANCEF) IVPB 1 g/50 mL premix Status: Discontinued  1 g  100 mL/hr over 30 Minutes  Intravenous  On call  03/07/14 1540  03/07/14 1555      Comments: Send with pt to OR         03/01/14 1800   doxycycline (VIBRA-TABS) tablet 100 mg Status: Discontinued  100 mg  Oral  Every 12 hours  03/01/14 1522  03/10/14 1119    03/01/14 0800   ciprofloxacin (CIPRO) tablet 500 mg Status: Discontinued  500 mg  Oral  Daily with breakfast  02/28/14 1106  03/10/14 1119    02/26/14 0000   doxycycline (VIBRAMYCIN) 100 MG capsule  100 mg  Oral  2 times daily  02/26/14 1153      02/26/14 0000   ciprofloxacin (CIPRO) 500 MG tablet  500 mg  Oral  2 times daily  02/26/14 1153     02/25/14 1100   ciprofloxacin (CIPRO) IVPB 400 mg Status: Discontinued  400 mg  200 mL/hr over 60 Minutes  Intravenous  Every 12 hours  02/25/14 1047  02/28/14 1106    02/24/14 1830   doxycycline (VIBRAMYCIN) 100 mg in dextrose 5 % 250 mL IVPB Status: Discontinued  100 mg  125 mL/hr over 120 Minutes  Intravenous  Every 12 hours  02/24/14 1756  03/01/14 1521    02/24/14 1800   cefTRIAXone (ROCEPHIN) 1 g in dextrose 5 % 50 mL IVPB Status: Discontinued  1 g  100 mL/hr over 30 Minutes  Intravenous  Every 24 hours  02/24/14 1231  02/24/14 1756    02/24/14 1200   vancomycin (VANCOCIN) 1,500 mg in sodium chloride 0.9 % 250 mL IVPB Status: Discontinued  1,500 mg  250 mL/hr over 60 Minutes  Intravenous  Once  02/23/14 1423  02/24/14 1231  02/20/14 2200   vancomycin (VANCOCIN) 1,500 mg in sodium chloride 0.9 % 500 mL IVPB Status: Discontinued  1,500 mg  250 mL/hr over 120 Minutes  Intravenous  Every 24 hours  2014-03-01 2119  02/22/14 1116    03-01-14 2200   vancomycin (VANCOCIN) 2,000 mg in sodium chloride 0.9 % 500 mL IVPB  2,000 mg  250 mL/hr over 120 Minutes  Intravenous  Once  March 01, 2014 2119  02/20/14 0005       HPI/Subjective: alert  Objective: Filed Vitals:   03/21/14 0642  BP: 98/64  Pulse: 110  Temp: 98.1 F (36.7 C)  Resp: 22    Intake/Output Summary (Last 24 hours) at 03/21/14 0914 Last data filed at 03/20/14 1700  Gross per 24 hour  Intake      0 ml  Output      0 ml  Net      0 ml   Filed Weights   03/17/14 2138 03/19/14 1348 03/19/14 1656  Weight: 217.3 kg (479 lb 1 oz) 222.5 kg (490 lb 8.4 oz) 221 kg (487 lb 3.5 oz)    Exam:   General:  alert  Cardiovascular: s1,s2 rrr  Respiratory: CTA BL  Abdomen: soft, obese, NT  Musculoskeletal: LE edema   Data Reviewed: Basic Metabolic Panel:  Recent Labs Lab 03/15/14 1030 03/16/14 1005 03/17/14 0500 03/18/14 0815  03/19/14 0725 03/20/14 0635  NA 137  --  133* 132* 131* 135*  K 2.3* 4.3 4.4 4.2 4.2 3.9  CL 92*  --  89* 86* 86* 89*  CO2 28  --  21 20 19 22   GLUCOSE 94  --  138* 139* 143* 120*  BUN 5*  --  49* 41* 48* 35*  CREATININE 0.94  --  4.94* 4.18* 4.75* 3.86*  CALCIUM 8.0*  --  9.1 9.1 9.1 8.4  PHOS 0.9*  --  5.7* 5.4* 5.7* 4.8*   Liver Function Tests:  Recent Labs Lab 03/15/14 1030 03/17/14 0500 03/18/14 0815 03/19/14 0725 03/20/14 0635  ALBUMIN 1.9* 1.6* 1.8* 1.7* 1.5*   No results found for this basename: LIPASE, AMYLASE,  in the last 168 hours No results found for this basename: AMMONIA,  in the last 168 hours CBC:  Recent Labs Lab 03/16/14 0615 03/16/14 1005 03/17/14 0500 03/18/14 0815 03/19/14 0725  WBC 10.1 9.3 10.0 12.2* 12.2*  NEUTROABS QUESTIONABLE RESULTS, RECOMMEND RECOLLECT TO VERIFY  --  7.1 9.0* 9.4*  HGB 9.2* 9.2* 8.5* 8.5* 8.6*  HCT 28.7* 29.0* 26.9* 26.7* 27.1*  MCV 87.8 90.6 89.4 88.1 88.9  PLT 114* 117* 110* 130* 144*   Cardiac Enzymes: No results found for this basename: CKTOTAL, CKMB, CKMBINDEX, TROPONINI,  in the last 168 hours BNP (last 3 results)  Recent Labs  2014-03-01 1220  PROBNP 937.1*   CBG:  Recent Labs Lab 03/20/14 0736 03/20/14 1333 03/20/14 1633 03/20/14 2146 03/21/14 0754  GLUCAP 119* 175* 165* 137* 132*    Recent Results (from the past 240 hour(s))  WOUND CULTURE     Status: None   Collection Time    03/16/14 10:12 AM      Result Value Ref Range Status   Specimen Description WOUND LEG LEFT   Final   Special Requests NONE   Final   Gram Stain     Final   Value: RARE WBC PRESENT, PREDOMINANTLY PMN     NO SQUAMOUS EPITHELIAL CELLS SEEN     ABUNDANT GRAM NEGATIVE RODS     MODERATE GRAM  POSITIVE COCCI     IN PAIRS   Culture     Final   Value: ABUNDANT ACINETOBACTER CALCOACETICUS/BAUMANNII COMPLEX     Note: COLISTIN SENSITIVE 0.38ug/mL     ABUNDANT STAPHYLOCOCCUS AUREUS     Note: RIFAMPIN AND GENTAMICIN SHOULD NOT  BE USED AS SINGLE DRUGS FOR TREATMENT OF STAPH INFECTIONS.     Performed at [facility]   Report Status PENDING   Incomplete   Organism ID, Bacteria ACINETOBACTER CALCOACETICUS/BAUMANNII COMPLEX   Final     Studies: No results found.  Scheduled Meds: . bisacodyl  10 mg Rectal Daily  . docusate sodium  200 mg Oral BID  . feeding supplement (NEPRO CARB STEADY)  237 mL Oral BID BM  . heparin subcutaneous  5,000 Units Subcutaneous 3 times per day  . insulin aspart  0-9 Units Subcutaneous TID WC  . midodrine  10 mg Oral TID WC  . multivitamin  1 tablet Oral QHS  . pantoprazole  40 mg Oral Q1200  . polyethylene glycol  17 g Oral Daily  . sodium bicarbonate  1,300 mg Oral TID  . sodium chloride  3 mL Intravenous Q12H   Continuous Infusions: . sodium chloride Stopped (03/15/14 0700)    Active Problems:   NHL (non-Hodgkin's lymphoma)   HTN (hypertension)   Dyslipidemia   Anemia of chronic disease   Morbid obesity   Lower GI bleed   Diabetes mellitus   Tumor lysis syndrome, possibility of   Uremia   SOB (shortness of breath) DDX = CHF vs. PE   Acidosis    Time spent: >35 minutes     [name]  [facility] Pager [phone]. If 7PM-7AM, please contact night-coverage at [email or web address], password [facility] 03/21/2014, 9:14 AM  LOS: 30 days

## 2014-03-22 ENCOUNTER — Inpatient Hospital Stay (HOSPITAL_COMMUNITY): Payer: BC Managed Care – PPO

## 2014-03-22 DIAGNOSIS — X58XXXA Exposure to other specified factors, initial encounter: Secondary | ICD-10-CM

## 2014-03-22 DIAGNOSIS — E669 Obesity, unspecified: Secondary | ICD-10-CM

## 2014-03-22 DIAGNOSIS — S31109A Unspecified open wound of abdominal wall, unspecified quadrant without penetration into peritoneal cavity, initial encounter: Secondary | ICD-10-CM

## 2014-03-22 DIAGNOSIS — I959 Hypotension, unspecified: Secondary | ICD-10-CM

## 2014-03-22 LAB — RENAL FUNCTION PANEL
Albumin: 1.5 g/dL — ABNORMAL LOW (ref 3.5–5.2)
BUN: 53 mg/dL — AB (ref 6–23)
CALCIUM: 8.9 mg/dL (ref 8.4–10.5)
CO2: 19 mEq/L (ref 19–32)
Chloride: 87 mEq/L — ABNORMAL LOW (ref 96–112)
Creatinine, Ser: 5.24 mg/dL — ABNORMAL HIGH (ref 0.50–1.35)
GFR calc Af Amer: 13 mL/min — ABNORMAL LOW (ref >90)
GFR calc non Af Amer: 11 mL/min — ABNORMAL LOW (ref >90)
GLUCOSE: 128 mg/dL — AB (ref 70–99)
PHOSPHORUS: 6.8 mg/dL — AB (ref 2.3–4.6)
Potassium: 4.9 mEq/L (ref 3.7–5.3)
SODIUM: 131 meq/L — AB (ref 137–147)

## 2014-03-22 LAB — CBC
HEMATOCRIT: 22.7 % — AB (ref 39.0–52.0)
HEMOGLOBIN: 7.2 g/dL — AB (ref 13.0–17.0)
MCH: 28.2 pg (ref 26.0–34.0)
MCHC: 31.7 g/dL (ref 30.0–36.0)
MCV: 89 fL (ref 78.0–100.0)
Platelets: 114 10*3/uL — ABNORMAL LOW (ref 150–400)
RBC: 2.55 MIL/uL — ABNORMAL LOW (ref 4.22–5.81)
RDW: 20.1 % — ABNORMAL HIGH (ref 11.5–15.5)
WBC: 13.2 10*3/uL — ABNORMAL HIGH (ref 4.0–10.5)

## 2014-03-22 LAB — WOUND CULTURE

## 2014-03-22 LAB — PREPARE RBC (CROSSMATCH)

## 2014-03-22 LAB — GLUCOSE, CAPILLARY
Glucose-Capillary: 122 mg/dL — ABNORMAL HIGH (ref 70–99)
Glucose-Capillary: 111 mg/dL — ABNORMAL HIGH (ref 70–99)
Glucose-Capillary: 124 mg/dL — ABNORMAL HIGH (ref 70–99)

## 2014-03-22 MED ORDER — LIDOCAINE-PRILOCAINE 2.5-2.5 % EX CREA: 1.0000 "application " | TOPICAL_CREAM | CUTANEOUS | Status: DC | PRN

## 2014-03-22 MED ORDER — ALTEPLASE 2 MG IJ SOLR
2.0000 mg | Freq: Once | INTRAMUSCULAR | Status: DC | PRN
Start: 2014-03-22 — End: 2014-03-22

## 2014-03-22 MED ORDER — NEPRO/CARBSTEADY PO LIQD: 237.0000 mL | ORAL | Status: DC | PRN

## 2014-03-22 MED ORDER — PENTAFLUOROPROP-TETRAFLUOROETH EX AERO: 1.0000 "application " | INHALATION_SPRAY | CUTANEOUS | Status: DC | PRN

## 2014-03-22 MED ORDER — MIDODRINE HCL 5 MG PO TABS
ORAL_TABLET | ORAL | Status: AC
  Administered 2014-03-22: 10 mg via ORAL
  Filled 2014-03-22: qty 2

## 2014-03-22 MED ORDER — HEPARIN SODIUM (PORCINE) 1000 UNIT/ML DIALYSIS: 1000.0000 [IU] | INTRAMUSCULAR | Status: DC | PRN

## 2014-03-22 MED ORDER — LIDOCAINE HCL (PF) 1 % IJ SOLN: 5.0000 mL | INTRAMUSCULAR | Status: DC | PRN

## 2014-03-22 MED ORDER — SODIUM CHLORIDE 0.9 % IV BOLUS (SEPSIS)
500.0000 mL | Freq: Once | INTRAVENOUS | Status: AC
  Administered 2014-03-22: 500 mL via INTRAVENOUS

## 2014-03-22 MED ORDER — SODIUM CHLORIDE 0.9 % IV SOLN: 100.0000 mL | INTRAVENOUS | Status: DC | PRN

## 2014-03-22 NOTE — Progress Notes (Signed)
NUTRITION FOLLOW-UP  DOCUMENTATION CODES Per approved criteria  -Morbid Obesity   INTERVENTION: Continue Nepro Shake po BID, each supplement provides 425 kcal and 19 grams protein.  Continue Renal-MVI daily. Encouraged high protein foods. RD to continue to follow nutrition care plan.  NUTRITION DIAGNOSIS: Increased nutrient needs related to wound healing and HD as evidenced by estimated needs. Ongoing.  Goal: Intake to meet >90% of estimated nutrition needs.  Monitor:  weight trends, lab trends, I/O's, PO intake, supplement tolerance  ASSESSMENT: PMHx significant for DM, HTN, dyslipidemia, morbid obesity, low-grade follicular NHL - supposed to have palliative XRT, receiving chemotherapy. Admitted with LLE swelling and increased SOB. Work-up reveals AKI.  Received CRRT 4/3 - 4/5. IHD initiated 4/7. Underwent HD catheter placement 4/20.  Pt with nonhealing L inguinal wound s/p bx in Dec 2014 and I&D in Mar 2015. VAC placed 4/6, dressing changes 2 x week. Per D'Lo RN note on 4/24 - pt with development of new wound to L inner groin. WOC RN revisited pt on 4/27 and contacted surgery team as wound drainage now has increased odor and increased in amount of drainage. VAC then discontinued on 4/28 by surgery team to better assess wound.  Ordered for Renal Diet, intake has improved, pt eating approx 50% of meals. Continues to receive Nepro Shakes PO BID. States that he likes these shakes, drinks 2-3 daily. States that his sister brings him about 2 meals a week to help with his appetite, states that he is tired of hospital food. She brings him chicken sandwiches usually - per his report. We reviewed his labs and noted that his phosphorus has been high, discussed high phosphorus foods to avoid.  RD provided renal education on 4/23.  Sodium low at 131 Potassium WNL Phosphorus elevated at 6.8 CBG's: 132 - 141  Height: Ht Readings from Last 1 Encounters:  03/14/14 5' 10.08" (1.78 m)     Weight: Wt Readings from Last 1 Encounters:  03/22/14 504 lb 13.7 oz (229 kg)  229.7 kg s/p HD 5/4 214.1 kg s/p HD 4/22 212.1 kg s/p HD 4/17 201.4 kg s/p HD 4/10  BMI:  Body mass index is 72.28 kg/(m^2). Obese Class III  Estimated Nutritional Needs: Kcal: 2300 - 2600 kcal Protein: 130 - 150 g Fluid: 1.2 liters  Skin:  Stage II to mid sacrum Closed R neck incision Thigh incision Groin incision - wound VAC in place  Diet Order: Renal w/ 1200 ml Fluid Restriction    Intake/Output Summary (Last 24 hours) at 03/22/14 1048 Last data filed at 03/22/14 1016  Gross per 24 hour  Intake   1030 ml  Output      0 ml  Net   1030 ml    Last BM: 5/3  Labs:   Recent Labs Lab 03/20/14 0635 03/21/14 1200 03/22/14 0617  NA 135* 132* 131*  K 3.9 4.2 4.9  CL 89* 85* 87*  CO2 22 19 19   BUN 35* 44* 53*  CREATININE 3.86* 4.76* 5.24*  CALCIUM 8.4 8.8 8.9  PHOS 4.8* 6.0* 6.8*  GLUCOSE 120* 140* 128*    CBG (last 3)   Recent Labs  03/20/14 2146 03/21/14 0754 03/21/14 1709  GLUCAP 137* 132* 141*    Scheduled Meds: . bisacodyl  10 mg Rectal Daily  . docusate sodium  200 mg Oral BID  . feeding supplement (NEPRO CARB STEADY)  237 mL Oral BID BM  . heparin subcutaneous  5,000 Units Subcutaneous 3 times per day  .  insulin aspart  0-9 Units Subcutaneous TID WC  . lactulose  30 g Oral BID  . midodrine  10 mg Oral TID WC  . multivitamin  1 tablet Oral QHS  . pantoprazole  40 mg Oral Q1200  . polyethylene glycol  17 g Oral Daily  . senna  2 tablet Oral Daily  . sodium bicarbonate  1,300 mg Oral TID  . sodium chloride  3 mL Intravenous Q12H    Continuous Infusions: . sodium chloride Stopped (03/15/14 0700)    Inda Coke MS, RD, LDN Inpatient Registered Dietitian Pager: [phone] After-hours pager: [phone]

## 2014-03-22 NOTE — Procedures (Signed)
Tolerating hemodialysis. He needs blood with hypotension and persistent oligo-anuria.  Avoid hypotension with tx.  Assessment  63M with AKI(creat 0.9 on 01/18/14) in setting of ARB, nonhealing inguinal wound, and s/p treatment for NHL.  1. AKI, normal baseline GFR.  2. Hyperkalemia w/ EKG changes, improving 3. NHL on active therapy 4. Nonhealing L inguinal wound 5. Hypertension, now hypotensive 6. Anemia, will transfuse 7. Diabetes Mellitus   Subjective: Interval History: minimal to no UOP  Objective: Vital signs in last 24 hours: Temp:  [97.8 F (36.6 C)-99.1 F (37.3 C)] 97.8 F (36.6 C) (05/04 0547) Pulse Rate:  [94-109] 100 (05/04 0733) Resp:  [18-22] 18 (05/04 0733) BP: (77-118)/(22-68) 102/54 mmHg (05/04 0733) SpO2:  [89 %-95 %] 90 % (05/04 0701) Weight:  [221.9 kg (489 lb 3.2 oz)-229 kg (504 lb 13.7 oz)] 229 kg (504 lb 13.7 oz) (05/04 0701) Weight change:   Intake/Output from previous day: 05/03 0701 - 05/04 0700 In: 600 [P.O.:600] Out: 1 [Stool:1] Intake/Output this shift:    General appearance: very large male with foul odor, morbidly obese , PC right chest  Lab Results:  Recent Labs  03/22/14 0717  WBC 13.2*  HGB 7.2*  HCT 22.7*  PLT PENDING   BMET:  Recent Labs  03/20/14 0635 03/21/14 1200  NA 135* 132*  K 3.9 4.2  CL 89* 85*  CO2 22 19  GLUCOSE 120* 140*  BUN 35* 44*  CREATININE 3.86* 4.76*  CALCIUM 8.4 8.8   No results found for this basename: PTH,  in the last 72 hours Iron Studies: No results found for this basename: IRON, TIBC, TRANSFERRIN, FERRITIN,  in the last 72 hours Studies/Results: Dg Chest Port 1 View  03/21/2014   CLINICAL DATA:  Breath  EXAM: PORTABLE CHEST - 1 VIEW  COMPARISON:  DG CHEST 1V PORT dated 03/11/2014  FINDINGS: There is a right-sided dual-lumen central venous catheter in satisfactory position.  There is bilateral diffuse interstitial thickening. There is no pleural effusion or pneumothorax. There is a persistent  right lower lobe pulmonary nodule. There is no other focal parenchymal opacity. There is enlargement of the central pulmonary vasculature. He cardiomediastinal silhouette is stable. The osseous structures are unremarkable.  IMPRESSION: 1. Findings of concerning for mild interstitial edema. 2. Stable right lower lobe pulmonary nodule.   Electronically Signed   By: Kathreen Devoid   On: 03/21/2014 23:41   Scheduled: . bisacodyl  10 mg Rectal Daily  . docusate sodium  200 mg Oral BID  . feeding supplement (NEPRO CARB STEADY)  237 mL Oral BID BM  . heparin subcutaneous  5,000 Units Subcutaneous 3 times per day  . insulin aspart  0-9 Units Subcutaneous TID WC  . lactulose  30 g Oral BID  . midodrine  10 mg Oral TID WC  . multivitamin  1 tablet Oral QHS  . pantoprazole  40 mg Oral Q1200  . polyethylene glycol  17 g Oral Daily  . senna  2 tablet Oral Daily  . sodium bicarbonate  1,300 mg Oral TID  . sodium chloride  3 mL Intravenous Q12H     LOS: 31 days   Estanislado Emms 03/22/2014,7:51 AM

## 2014-03-22 NOTE — Progress Notes (Signed)
INFECTIOUS DISEASE PROGRESS NOTE  ID: Douglas [name] is a 58 y.o. male with  Active Problems:   NHL (non-Hodgkin's lymphoma)   HTN (hypertension)   Dyslipidemia   Anemia of chronic disease   Morbid obesity   Lower GI bleed   Diabetes mellitus   AKI (acute kidney injury)   Hypotension   Non-healing left groin open wound   Obstructive sleep apnea  Subjective: Without complaints  Abtx:  Anti-infectives   Start     Dose/Rate Route Frequency Ordered Stop   03/17/2014 0953  ceFAZolin (ANCEF) 1-5 GM-% IVPB    Comments:  [name]   : cabinet override      03/06/2014 1194 02/25/2014 2159   03/15/2014 0953  ceFAZolin (ANCEF) 2-3 GM-% IVPB SOLR    Comments:  [name]   : cabinet override      03/04/2014 0953 03/05/2014 2159   03/02/2014 0600  [MAR Hold]  ceFAZolin (ANCEF) 3 g in dextrose 5 % 50 mL IVPB     (On MAR Hold since 02/22/2014 0850)   3 g 160 mL/hr over 30 Minutes Intravenous On call 03/07/14 1556 02/18/2014 1021   02/28/2014 0000  ceFAZolin (ANCEF) IVPB 1 g/50 mL premix  Status:  Discontinued    Comments:  Send with pt to OR   1 g 100 mL/hr over 30 Minutes Intravenous On call 03/07/14 1540 03/07/14 1555   03/01/14 1800  doxycycline (VIBRA-TABS) tablet 100 mg  Status:  Discontinued     100 mg Oral Every 12 hours 03/01/14 1522 03/10/14 1119   03/01/14 0800  ciprofloxacin (CIPRO) tablet 500 mg  Status:  Discontinued     500 mg Oral Daily with breakfast 02/28/14 1106 03/10/14 1119   02/26/14 0000  doxycycline (VIBRAMYCIN) 100 MG capsule     100 mg Oral 2 times daily 02/26/14 1153     02/26/14 0000  ciprofloxacin (CIPRO) 500 MG tablet     500 mg Oral 2 times daily 02/26/14 1153     02/25/14 1100  ciprofloxacin (CIPRO) IVPB 400 mg  Status:  Discontinued     400 mg 200 mL/hr over 60 Minutes Intravenous Every 12 hours 02/25/14 1047 02/28/14 1106   02/24/14 1830  doxycycline (VIBRAMYCIN) 100 mg in dextrose 5 % 250 mL IVPB  Status:  Discontinued     100 mg 125 mL/hr over 120  Minutes Intravenous Every 12 hours 02/24/14 1756 03/01/14 1521   02/24/14 1800  cefTRIAXone (ROCEPHIN) 1 g in dextrose 5 % 50 mL IVPB  Status:  Discontinued     1 g 100 mL/hr over 30 Minutes Intravenous Every 24 hours 02/24/14 1231 02/24/14 1756   02/24/14 1200  vancomycin (VANCOCIN) 1,500 mg in sodium chloride 0.9 % 250 mL IVPB  Status:  Discontinued     1,500 mg 250 mL/hr over 60 Minutes Intravenous  Once 02/23/14 1423 02/24/14 1231   02/20/14 2200  vancomycin (VANCOCIN) 1,500 mg in sodium chloride 0.9 % 500 mL IVPB  Status:  Discontinued     1,500 mg 250 mL/hr over 120 Minutes Intravenous Every 24 hours 02/17/2014 2119 02/22/14 1116   03/13/2014 2200  vancomycin (VANCOCIN) 2,000 mg in sodium chloride 0.9 % 500 mL IVPB     2,000 mg 250 mL/hr over 120 Minutes Intravenous  Once 02/17/2014 2119 02/20/14 0005      Medications:  Scheduled: . bisacodyl  10 mg Rectal Daily  . docusate sodium  200 mg Oral BID  . feeding supplement (NEPRO CARB  STEADY)  237 mL Oral BID BM  . heparin subcutaneous  5,000 Units Subcutaneous 3 times per day  . insulin aspart  0-9 Units Subcutaneous TID WC  . lactulose  30 g Oral BID  . midodrine  10 mg Oral TID WC  . multivitamin  1 tablet Oral QHS  . pantoprazole  40 mg Oral Q1200  . polyethylene glycol  17 g Oral Daily  . senna  2 tablet Oral Daily  . sodium bicarbonate  1,300 mg Oral TID  . sodium chloride  3 mL Intravenous Q12H    Objective: Vital signs in last 24 hours: Temp:  [97.5 F (36.4 C)-98.6 F (37 C)] 97.8 F (36.6 C) (05/04 1511) Pulse Rate:  [90-105] 90 (05/04 1511) Resp:  [18-22] 18 (05/04 1511) BP: (72-118)/(22-77) 98/66 mmHg (05/04 1511) SpO2:  [90 %-96 %] 96 % (05/04 1511) Weight:  [221.9 kg (489 lb 3.2 oz)-229.7 kg (506 lb 6.4 oz)] 229.7 kg (506 lb 6.4 oz) (05/04 1058)   General appearance: alert, cooperative, no distress and morbidly obese Extremities: wound in L groin is undressed, partially unpacked. malodorous (? from dressing),  no d/c, no tendereness.   Lab Results  Recent Labs  03/21/14 1200 03/22/14 0617 03/22/14 0717  WBC  --   --  13.2*  HGB  --   --  7.2*  HCT  --   --  22.7*  NA 132* 131*  --   K 4.2 4.9  --   CL 85* 87*  --   CO2 19 19  --   BUN 44* 53*  --   CREATININE 4.76* 5.24*  --    Liver Panel  Recent Labs  03/21/14 1200 03/22/14 0617  ALBUMIN 1.7* 1.5*   Sedimentation Rate No results found for this basename: ESRSEDRATE,  in the last 72 hours C-Reactive Protein No results found for this basename: CRP,  in the last 72 hours  Microbiology: Recent Results (from the past 240 hour(s))  WOUND CULTURE     Status: None   Collection Time    03/16/14 10:12 AM      Result Value Ref Range Status   Specimen Description WOUND LEG LEFT   Final   Special Requests NONE   Final   Gram Stain     Final   Value: RARE WBC PRESENT, PREDOMINANTLY PMN     NO SQUAMOUS EPITHELIAL CELLS SEEN     ABUNDANT GRAM NEGATIVE RODS     MODERATE GRAM POSITIVE COCCI     IN PAIRS   Culture     Final   Value: ABUNDANT ACINETOBACTER CALCOACETICUS/BAUMANNII COMPLEX     Note: COLISTIN SENSITIVE 0.38ug/mL TIGECYCLINE 2 ML/ug     ABUNDANT METHICILLIN RESISTANT STAPHYLOCOCCUS AUREUS     Note: RIFAMPIN AND GENTAMICIN SHOULD NOT BE USED AS SINGLE DRUGS FOR TREATMENT OF STAPH INFECTIONS. CRITICAL RESULT CALLED TO, READ BACK BY AND VERIFIED WITH: ASHLEY LARSON @ 9:58AM 03/21/14 BY DWEEKS     Performed at [facility]   Report Status 03/22/2014 FINAL   Final   Organism ID, Bacteria ACINETOBACTER CALCOACETICUS/BAUMANNII COMPLEX   Final   Organism ID, Bacteria METHICILLIN RESISTANT STAPHYLOCOCCUS AUREUS   Final    Studies/Results: Dg Chest Port 1 View  03/21/2014   CLINICAL DATA:  Breath  EXAM: PORTABLE CHEST - 1 VIEW  COMPARISON:  DG CHEST 1V PORT dated 03/10/2014  FINDINGS: There is a right-sided dual-lumen central venous catheter in satisfactory position.  There is bilateral diffuse interstitial  thickening. There  is no pleural effusion or pneumothorax. There is a persistent right lower lobe pulmonary nodule. There is no other focal parenchymal opacity. There is enlargement of the central pulmonary vasculature. He cardiomediastinal silhouette is stable. The osseous structures are unremarkable.  IMPRESSION: 1. Findings of concerning for mild interstitial edema. 2. Stable right lower lobe pulmonary nodule.   Electronically Signed   By: Kathreen Devoid   On: 03/21/2014 23:41     Assessment/Plan: NHL Chronic wound hypotension Obesity Acinetobacter, MRSA colonization vs infection  Total days of antibiotics: off  Continue to watch off anbx His acinetobacter has a MIC of 2 (within published trials which use <2, [facility] uses < 1) He has been afebrile. WBC slightly up.  If any clinical change would check CT abdomen.           Campbell Riches Infectious Diseases (pager) [phone] [email or web address] 03/22/2014, 4:56 PM  LOS: 31 days   **Disclaimer: This note may have been dictated with voice recognition software. Similar sounding words can inadvertently be transcribed and this note may contain transcription errors which may not have been corrected upon publication of note.**

## 2014-03-22 NOTE — Progress Notes (Signed)
Patient states he can use the CPAP machine as needed and knows how to put his mask on and off himself. Patient is aware to call RT if he does need any help at all with the machine. RT will continue to assist as needed.

## 2014-03-22 NOTE — Progress Notes (Signed)
[facility] PROGRESS NOTE  Douglas [name] UYQ:[id number] DOB: 03-01-1956 DOA: 03/14/2014 PCP: [name], MD  Assessment/Plan: Patient is a 58 year old male with history of follicular low-grade non-Hodgkin lymphoma, large left groin lymphadenopathy, diagnosed in 2000 and was on recent chemotherapy, had admission for acute renal failure 3/17- 3/20. He was suspected to have acute renal failure de to HCTZ and ARB.  He had outpatient left-sided inguinal node biopsy after which he has developed a large lymph filled seroma on the left groin requiring wound VAC placement and continuous drainage. Have discussed his case multiple times with general surgery who do not recommend any further surgical intervention. Once he has a rehabilitation bed he could be placed in a rehabilitation as he requires intermittent IV fluid boluses for ongoing fluid loss due to daily lymph drainage from the wound .   He is currently stable from medicine standpoint for discharge to a monitored setting like LTEC;  -he is not stable enough to go SNF or CIR at this time; , case management and social worker have been informed. His long-term prognosis although appears not to good due to underlying lymphoma, ESRD, anasarca and baseline low blood pressures.   SIGNIFICANT EVENTS / STUDIES:  4/3 Hyperkalemia, hyperuricemia, hyperphosphatemia, with ARF requiring emergent CRRT. UA with urate crystals. Renal US normal. No pulmonary edema on CXR. Heparin gtt started due to concern for PE. Started IV vanc for left groin wound.  4/4 ARF thought to be due to ATN from severe sepsis, per oncology not tumor lysis syndrome. No DVT, heparin gtt stopped. 2D-echo normal.  4/5 Continue CRRT, to transition to HD, no renal recovery yet. Hyperuricemia resolved. Dialysis access is a right IJ dialysis catheter  4/6 .Wound vac placed  4/7 -hemodialysis initiated   Assessment/Plan:  1. Acute kidney injury-potentially related to tumor lysis now  ESRD :  - Etiology prerenal from volume loss from her fistula versus severe sepsis from left groin infection. Per Oncology tumor lysis syndrome less likely, Currently undergoing dialysis per renal on a 3 times a week basis. Discussed with renal now likely ESRD.  -per nephrology not a candidate for outpatient HD yet  2. NHL (non-Hodgkin's lymphoma) with follicular low-grade, large left groin lymph adenopathy status post chemotherapy, hyperuricemia -s/p Bendamustine and rituximab 02/03/14  Hyperuricemia has resolved, oncology following (Dr.Magrinat), stable G6PD, LDH levels are around 600 which is twice normal, transfuse PRBCs as needed. CT abdomen for restaging outpatient home along with outpatient followup with oncology post discharge.  3. 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  Since his blood pressures are low, got totaled 2 units of packed RBCs one each on 03/11/2014 and 03/12/2014, no signs of ongoing bleed, post transfusion stable H&H.  4. 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 Hulen Skains on 02/24/2014, patient to be discharged with home health and wound VAC, will follow with Dr. Barkley Bruns in the office post discharge.  - Noted wound growing gram-negative rods was placed on doxycycline + cipro on 02/25/2014 stopped on 03/10/14;  -wound c/s (4/28): ACINETOBACTER CALCOACETICUS/BAUMANNII asked ID evaluation w/ need atx treatment with intermittent hypotension, mild leukocytoses  -will obtain CT L groin r/o deep infection due to intermittent hypotension  5. Intermittent tachycardic and hypotension  Likely due to fluid removal during HD, along with ongoing fluid loss from his left inguinal drain , will supplement with IV fluids as needed, have discussed with surgery, they have  no other solution then to continue drainage this time. Per Dr. Barkley Bruns and Hulen Skains no further surgical  intervention is required. Steroids were tapered off. Increased midodrene. Stable random cortisol and post transfusion H&H.  -may need IVF bolus intermittent  6. Type 2 diabetes mellitus  - Continue sliding scale insulin  7. GERD with history of GI bleed: Continue PPI  -mild constipation; added bowel regimen; monitor   Patient i snot stable enough to go SNF or CIR due to ongoing medical issues, dialysis, wound care, severe deconditioning, intermittent hypotension with HD -But he is a good candidate for long term care like LTEC; CM involved   Code Status: full Family Communication:  D/w patient, called updated [name],[name] [phone] (indicate person spoken with, relationship, and if by phone, the number) Disposition Plan: CIR vs LTEC  Consultants:  Surgery  Nephrology  Oncology Procedures:  Dialysis  Wound VAC Antibiotics:  Anti-infectives    Start    Dose/Rate  Route  Frequency  Ordered  Stop    03/15/2014 0953   ceFAZolin (ANCEF) 1-5 GM-% IVPB     02/20/2014 0953  03/06/2014 2159      Comments: [name] : cabinet override         03/06/2014 0953   ceFAZolin (ANCEF) 2-3 GM-% IVPB SOLR     03/12/2014 0953  02/17/2014 2159      Comments: [name] : cabinet override         03/07/2014 0600   [MAR Hold] ceFAZolin (ANCEF) 3 g in dextrose 5 % 50 mL IVPB (On MAR Hold since 03/15/2014 0850)  3 g  160 mL/hr over 30 Minutes  Intravenous  On call  03/07/14 1556  02/24/2014 1021    03/16/2014 0000   ceFAZolin (ANCEF) IVPB 1 g/50 mL premix Status: Discontinued  1 g  100 mL/hr over 30 Minutes  Intravenous  On call  03/07/14 1540  03/07/14 1555      Comments: Send with pt to OR         03/01/14 1800   doxycycline (VIBRA-TABS) tablet 100 mg Status: Discontinued  100 mg  Oral  Every 12 hours  03/01/14 1522  03/10/14 1119    03/01/14 0800   ciprofloxacin (CIPRO) tablet 500 mg Status: Discontinued  500 mg  Oral  Daily with breakfast  02/28/14 1106  03/10/14 1119    02/26/14 0000   doxycycline  (VIBRAMYCIN) 100 MG capsule  100 mg  Oral  2 times daily  02/26/14 1153     02/26/14 0000   ciprofloxacin (CIPRO) 500 MG tablet  500 mg  Oral  2 times daily  02/26/14 1153     02/25/14 1100   ciprofloxacin (CIPRO) IVPB 400 mg Status: Discontinued  400 mg  200 mL/hr over 60 Minutes  Intravenous  Every 12 hours  02/25/14 1047  02/28/14 1106    02/24/14 1830   doxycycline (VIBRAMYCIN) 100 mg in dextrose 5 % 250 mL IVPB Status: Discontinued  100 mg  125 mL/hr over 120 Minutes  Intravenous  Every 12 hours  02/24/14 1756  03/01/14 1521    02/24/14 1800   cefTRIAXone (ROCEPHIN) 1 g in dextrose 5 % 50 mL IVPB Status: Discontinued  1 g  100 mL/hr over 30 Minutes  Intravenous  Every 24 hours  02/24/14 1231  02/24/14 1756    02/24/14 1200   vancomycin (VANCOCIN) 1,500 mg in sodium chloride 0.9 % 250 mL IVPB Status: Discontinued  1,500 mg  250 mL/hr over  60 Minutes  Intravenous  Once  02/23/14 1423  02/24/14 1231    02/20/14 2200   vancomycin (VANCOCIN) 1,500 mg in sodium chloride 0.9 % 500 mL IVPB Status: Discontinued  1,500 mg  250 mL/hr over 120 Minutes  Intravenous  Every 24 hours  03/17/2014 2119  02/22/14 1116    03/10/2014 2200   vancomycin (VANCOCIN) 2,000 mg in sodium chloride 0.9 % 500 mL IVPB  2,000 mg  250 mL/hr over 120 Minutes  Intravenous  Once  02/20/2014 2119  02/20/14 0005       HPI/Subjective: alert  Objective: Filed Vitals:   03/22/14 1058  BP: 110/59  Pulse: 102  Temp: 98.3 F (36.8 C)  Resp: 22    Intake/Output Summary (Last 24 hours) at 03/22/14 1357 Last data filed at 03/22/14 1058  Gross per 24 hour  Intake    790 ml  Output   -670 ml  Net   1460 ml   Filed Weights   03/21/14 2102 03/22/14 0701 03/22/14 1058  Weight: 221.9 kg (489 lb 3.2 oz) 229 kg (504 lb 13.7 oz) 229.7 kg (506 lb 6.4 oz)    Exam:   General:  alert  Cardiovascular: s1,s2 rrr  Respiratory: CTA BL  Abdomen: soft, obese, NT  Musculoskeletal: LE edema   Data Reviewed: Basic Metabolic  Panel:  Recent Labs Lab 03/18/14 0815 03/19/14 0725 03/20/14 0635 03/21/14 1200 03/22/14 0617  NA 132* 131* 135* 132* 131*  K 4.2 4.2 3.9 4.2 4.9  CL 86* 86* 89* 85* 87*  CO2 20 19 22 19 19   GLUCOSE 139* 143* 120* 140* 128*  BUN 41* 48* 35* 44* 53*  CREATININE 4.18* 4.75* 3.86* 4.76* 5.24*  CALCIUM 9.1 9.1 8.4 8.8 8.9  PHOS 5.4* 5.7* 4.8* 6.0* 6.8*   Liver Function Tests:  Recent Labs Lab 03/18/14 0815 03/19/14 0725 03/20/14 0635 03/21/14 1200 03/22/14 0617  ALBUMIN 1.8* 1.7* 1.5* 1.7* 1.5*   No results found for this basename: LIPASE, AMYLASE,  in the last 168 hours No results found for this basename: AMMONIA,  in the last 168 hours CBC:  Recent Labs Lab 03/16/14 0615 03/16/14 1005 03/17/14 0500 03/18/14 0815 03/19/14 0725 03/22/14 0717  WBC 10.1 9.3 10.0 12.2* 12.2* 13.2*  NEUTROABS QUESTIONABLE RESULTS, RECOMMEND RECOLLECT TO VERIFY  --  7.1 9.0* 9.4*  --   HGB 9.2* 9.2* 8.5* 8.5* 8.6* 7.2*  HCT 28.7* 29.0* 26.9* 26.7* 27.1* 22.7*  MCV 87.8 90.6 89.4 88.1 88.9 89.0  PLT 114* 117* 110* 130* 144* 114*   Cardiac Enzymes: No results found for this basename: CKTOTAL, CKMB, CKMBINDEX, TROPONINI,  in the last 168 hours BNP (last 3 results)  Recent Labs  02/17/2014 1220  PROBNP 937.1*   CBG:  Recent Labs Lab 03/20/14 1633 03/20/14 2146 03/21/14 0754 03/21/14 1709 03/22/14 1213  GLUCAP 165* 137* 132* 141* 122*    Recent Results (from the past 240 hour(s))  WOUND CULTURE     Status: None   Collection Time    03/16/14 10:12 AM      Result Value Ref Range Status   Specimen Description WOUND LEG LEFT   Final   Special Requests NONE   Final   Gram Stain     Final   Value: RARE WBC PRESENT, PREDOMINANTLY PMN     NO SQUAMOUS EPITHELIAL CELLS SEEN     ABUNDANT GRAM NEGATIVE RODS     MODERATE GRAM POSITIVE COCCI     IN PAIRS  Culture     Final   Value: ABUNDANT ACINETOBACTER CALCOACETICUS/BAUMANNII COMPLEX     Note: COLISTIN SENSITIVE 0.38ug/mL  TIGECYCLINE 2 ML/ug     ABUNDANT METHICILLIN RESISTANT STAPHYLOCOCCUS AUREUS     Note: RIFAMPIN AND GENTAMICIN SHOULD NOT BE USED AS SINGLE DRUGS FOR TREATMENT OF STAPH INFECTIONS. CRITICAL RESULT CALLED TO, READ BACK BY AND VERIFIED WITH: ASHLEY LARSON @ 9:58AM 03/21/14 BY DWEEKS     Performed at [facility]   Report Status 03/22/2014 FINAL   Final   Organism ID, Bacteria ACINETOBACTER CALCOACETICUS/BAUMANNII COMPLEX   Final   Organism ID, Bacteria METHICILLIN RESISTANT STAPHYLOCOCCUS AUREUS   Final     Studies: Dg Chest Port 1 View  03/21/2014   CLINICAL DATA:  Breath  EXAM: PORTABLE CHEST - 1 VIEW  COMPARISON:  DG CHEST 1V PORT dated 02/28/2014  FINDINGS: There is a right-sided dual-lumen central venous catheter in satisfactory position.  There is bilateral diffuse interstitial thickening. There is no pleural effusion or pneumothorax. There is a persistent right lower lobe pulmonary nodule. There is no other focal parenchymal opacity. There is enlargement of the central pulmonary vasculature. He cardiomediastinal silhouette is stable. The osseous structures are unremarkable.  IMPRESSION: 1. Findings of concerning for mild interstitial edema. 2. Stable right lower lobe pulmonary nodule.   Electronically Signed   By: Kathreen Devoid   On: 03/21/2014 23:41    Scheduled Meds: . bisacodyl  10 mg Rectal Daily  . docusate sodium  200 mg Oral BID  . feeding supplement (NEPRO CARB STEADY)  237 mL Oral BID BM  . heparin subcutaneous  5,000 Units Subcutaneous 3 times per day  . insulin aspart  0-9 Units Subcutaneous TID WC  . lactulose  30 g Oral BID  . midodrine  10 mg Oral TID WC  . multivitamin  1 tablet Oral QHS  . pantoprazole  40 mg Oral Q1200  . polyethylene glycol  17 g Oral Daily  . senna  2 tablet Oral Daily  . sodium bicarbonate  1,300 mg Oral TID  . sodium chloride  3 mL Intravenous Q12H   Continuous Infusions: . sodium chloride Stopped (03/15/14 0700)    Active Problems:    NHL (non-Hodgkin's lymphoma)   HTN (hypertension)   Dyslipidemia   Anemia of chronic disease   Morbid obesity   Lower GI bleed   Diabetes mellitus   AKI (acute kidney injury)   Hypotension   Non-healing left groin open wound   Obstructive sleep apnea    Time spent: >35 minutes     [name]  [facility] Pager [phone]. If 7PM-7AM, please contact night-coverage at [email or web address], password [facility] 03/22/2014, 1:57 PM  LOS: 31 days

## 2014-03-22 NOTE — Progress Notes (Signed)
PT Cancellation Note  Patient Details Name: Douglas [name] MRN: [id number] DOB: 18-Jul-1956   Cancelled Treatment:    Reason Eval/Treat Not Completed: Patient at procedure or test/unavailable  Attempted to see for PT in am before HD as planned by Manuela Schwartz, PT last Friday  Pt was already in HD as of 7:15 am, so unable to work with pt  Considering the importance of pt's mobility goals, would like to schedule to see pt prior to HD, so he can give best effort and we can maximize progress  Will return for PT session tomorrow at around 10 am; Will coordinate with Nursing to have pt in [place] by 10am  Will plan to coordinate with Nursing on Mondays, Wednesdays, and Fridays to have pt in [place] (via sky-lift) by 8am for PT, and coordinate with HD to take him to dialysis at around 10 am   [facility] 03/22/2014, 8:35 AM  [name], [place] Pager [phone] Office [phone]

## 2014-03-23 ENCOUNTER — Inpatient Hospital Stay (HOSPITAL_COMMUNITY): Payer: BC Managed Care – PPO

## 2014-03-23 LAB — TYPE AND SCREEN
ABO/RH(D): A POS
Antibody Screen: NEGATIVE
UNIT DIVISION: 0
Unit division: 0

## 2014-03-23 LAB — GLUCOSE, CAPILLARY
GLUCOSE-CAPILLARY: 140 mg/dL — AB (ref 70–99)
Glucose-Capillary: 141 mg/dL — ABNORMAL HIGH (ref 70–99)
Glucose-Capillary: 128 mg/dL — ABNORMAL HIGH (ref 70–99)
Glucose-Capillary: 133 mg/dL — ABNORMAL HIGH (ref 70–99)
Glucose-Capillary: 134 mg/dL — ABNORMAL HIGH (ref 70–99)
Glucose-Capillary: 120 mg/dL — ABNORMAL HIGH (ref 70–99)

## 2014-03-23 LAB — RENAL FUNCTION PANEL
Albumin: 1.4 g/dL — ABNORMAL LOW (ref 3.5–5.2)
BUN: 39 mg/dL — AB (ref 6–23)
CALCIUM: 7.8 mg/dL — AB (ref 8.4–10.5)
CO2: 19 mEq/L (ref 19–32)
CREATININE: 3.99 mg/dL — AB (ref 0.50–1.35)
Chloride: 91 mEq/L — ABNORMAL LOW (ref 96–112)
GFR calc Af Amer: 18 mL/min — ABNORMAL LOW (ref >90)
GFR calc non Af Amer: 15 mL/min — ABNORMAL LOW (ref >90)
GLUCOSE: 132 mg/dL — AB (ref 70–99)
Phosphorus: 5.2 mg/dL — ABNORMAL HIGH (ref 2.3–4.6)
Potassium: 5 mEq/L (ref 3.7–5.3)
Sodium: 131 mEq/L — ABNORMAL LOW (ref 137–147)

## 2014-03-23 LAB — CBC
HCT: 27.7 % — ABNORMAL LOW (ref 39.0–52.0)
Hemoglobin: 9.2 g/dL — ABNORMAL LOW (ref 13.0–17.0)
MCH: 28.8 pg (ref 26.0–34.0)
MCHC: 33.2 g/dL (ref 30.0–36.0)
MCV: 86.6 fL (ref 78.0–100.0)
Platelets: 98 10*3/uL — ABNORMAL LOW (ref 150–400)
RBC: 3.2 MIL/uL — ABNORMAL LOW (ref 4.22–5.81)
RDW: 19.3 % — ABNORMAL HIGH (ref 11.5–15.5)
WBC: 12.1 10*3/uL — ABNORMAL HIGH (ref 4.0–10.5)

## 2014-03-23 LAB — HEPATITIS B SURFACE ANTIGEN: Hepatitis B Surface Ag: NEGATIVE

## 2014-03-23 MED ORDER — MORPHINE SULFATE 2 MG/ML IJ SOLN: 1.0000 mg | Freq: Once | INTRAMUSCULAR | Status: DC

## 2014-03-23 MED ORDER — SODIUM CHLORIDE 0.9 % IV BOLUS (SEPSIS)
500.0000 mL | Freq: Once | INTRAVENOUS | Status: AC
  Administered 2014-03-23: 500 mL via INTRAVENOUS

## 2014-03-23 MED ORDER — SODIUM CHLORIDE 0.9 % IV BOLUS (SEPSIS)
200.0000 mL | Freq: Once | INTRAVENOUS | Status: AC
  Administered 2014-03-23: 200 mL via INTRAVENOUS

## 2014-03-23 NOTE — Progress Notes (Signed)
Pt refuses to wear CPAP tonight. Pt encouraged to call RT if pt changes mind. No distress noted. 

## 2014-03-23 NOTE — Progress Notes (Signed)
PT bp 68/36 after 500 ml bolus over 2 hrs. MD notified. Pt due for scheduled midodrine. MD ok'd another 200 cc bolus. Will give and continue to monitor.

## 2014-03-23 NOTE — Progress Notes (Signed)
[facility] informed of Pt Bp 77/32 awaiting response Arthor Captain LPN

## 2014-03-23 NOTE — Progress Notes (Signed)
[facility] PROGRESS NOTE  Douglas [name] [id number] DOB: Nov 27, 1955 DOA: 02/22/2014 PCP: [name], MD  Brief summary  Patient is a 58 year old male with history of follicular low-grade non-Hodgkin lymphoma, large left groin lymphadenopathy, diagnosed in 2000 and was on recent chemotherapy, had admission for acute renal failure 3/17- 3/20. He was suspected to have acute renal failure de to HCTZ and ARB.  He had outpatient left-sided inguinal node biopsy after which he   has developed a large lymph filled seroma on the left groin requiring wound VAC placement and continuous drainage. Have discussed his case multiple times with general surgery who do not recommend any further surgical intervention. Once he has a rehabilitation bed he could be placed in a rehabilitation as he requires intermittent IV fluid boluses for ongoing fluid loss due to daily lymph drainage from the wound .   He is currently stable from medicine standpoint for discharge to a monitored setting like LTEC;  -he is not stable enough to go SNF or CIR at this time; , case management and social worker have been informed. His long-term prognosis although appears not to good due to underlying lymphoma, ESRD, anasarca and baseline low blood pressures.   SIGNIFICANT EVENTS / STUDIES:  4/3 Hyperkalemia, hyperuricemia, hyperphosphatemia, with ARF requiring emergent CRRT. UA with urate crystals. Renal US normal. No pulmonary edema on CXR.  4/4 ARF thought to be due to ATN from severe sepsis, per oncology not tumor lysis syndrome. No DVT, heparin gtt stopped. 2D-echo normal EF 4/5 Continue CRRT, to transition to HD, no renal recovery yet. Hyperuricemia resolved. Dialysis access is a right IJ dialysis catheter  4/6 .Wound vac placed  4/7 -hemodialysis initiated   Assessment/Plan:  1. Acute kidney injury-potentially related to tumor lysis now ESRD :  - Etiology prerenal from volume loss from her fistula versus severe  sepsis from left groin infection. Per Oncology tumor lysis syndrome less likely, Currently undergoing dialysis per renal on a 3 times a week basis. Discussed with renal now likely ESRD.  -per nephrology not a candidate for outpatient HD yet  2. NHL (non-Hodgkin's lymphoma) with follicular low-grade, large left groin lymph adenopathy status post chemotherapy, hyperuricemia -s/p Bendamustine and rituximab 02/03/14  Hyperuricemia has resolved, oncology following (Dr.Magrinat), stable G6PD, LDH levels are around 600 which is twice normal, transfuse PRBCs as needed. CT abdomen for restaging outpatient home along with outpatient followup with oncology post discharge.  3. 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  Since his blood pressures are low, s/p PRBCs 2 units 04/23 no signs of ongoing bleed, s/p 2 units 5/3; monito rHg; TF prn with HD 4. 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 Hulen Skains on 02/24/2014, patient to be discharged with home health and wound VAC, will follow with Dr. Barkley Bruns in the office post discharge.  - Noted wound growing gram-negative rods was placed on doxycycline + cipro on 02/25/2014 stopped on 03/10/14;  -wound c/s (4/28): ACINETOBACTER CALCOACETICUS/BAUMANNII monitoring off atax per ID;  -obtain US gron r/o deep infection; could not fit the  CT  5. Intermittent tachycardic and hypotension  Likely due to fluid removal during HD, along with ongoing fluid loss from his left inguinal drain , will supplement with IV fluids as needed, have discussed with surgery, they have no other solution then to continue drainage this time. Per Dr. Barkley Bruns and Hulen Skains no further surgical intervention is required. Steroids  were tapered off. Increased midodrene. Stable random cortisol and post transfusion H&H.  -may need IVF bolus intermittent  6. Type 2 diabetes mellitus  -  Continue sliding scale insulin  7. GERD with history of GI bleed: Continue PPI  -mild constipation; added bowel regimen; monitor   Patient is not stable enough to go SNF or CIR due to ongoing medical issues, dialysis, wound care, severe deconditioning, intermittent hypotension with HD -But he is a good candidate for long term care like LTEC; CM involved   Code Status: full Family Communication:  D/w patient, called updated [name],[name] [phone] (indicate person spoken with, relationship, and if by phone, the number) Disposition Plan: [place]  Consultants:  Surgery  Nephrology  Oncology Procedures:  Dialysis  Wound VAC Antibiotics:  Anti-infectives    Start    Dose/Rate  Route  Frequency  Ordered  Stop    Mar 19, 2014 0953   ceFAZolin (ANCEF) 1-5 GM-% IVPB     2014-03-19 0953  19-Mar-2014 2159      Comments: [name] : cabinet override         Mar 19, 2014 0953   ceFAZolin (ANCEF) 2-3 GM-% IVPB SOLR     2014-03-19 0953  Mar 19, 2014 2159      Comments: [name] : cabinet override         Mar 19, 2014 0600   [MAR Hold] ceFAZolin (ANCEF) 3 g in dextrose 5 % 50 mL IVPB (On MAR Hold since 03/19/14 0850)  3 g  160 mL/hr over 30 Minutes  Intravenous  On call  03/07/14 1556  Mar 19, 2014 1021    Mar 19, 2014 0000   ceFAZolin (ANCEF) IVPB 1 g/50 mL premix Status: Discontinued  1 g  100 mL/hr over 30 Minutes  Intravenous  On call  03/07/14 1540  03/07/14 1555      Comments: Send with pt to OR         03/01/14 1800   doxycycline (VIBRA-TABS) tablet 100 mg Status: Discontinued  100 mg  Oral  Every 12 hours  03/01/14 1522  03/10/14 1119    03/01/14 0800   ciprofloxacin (CIPRO) tablet 500 mg Status: Discontinued  500 mg  Oral  Daily with breakfast  02/28/14 1106  03/10/14 1119    02/26/14 0000   doxycycline (VIBRAMYCIN) 100 MG capsule  100 mg  Oral  2 times daily  02/26/14 1153     02/26/14 0000   ciprofloxacin (CIPRO) 500 MG tablet  500 mg  Oral  2 times daily  02/26/14 1153     02/25/14 1100   ciprofloxacin  (CIPRO) IVPB 400 mg Status: Discontinued  400 mg  200 mL/hr over 60 Minutes  Intravenous  Every 12 hours  02/25/14 1047  02/28/14 1106    02/24/14 1830   doxycycline (VIBRAMYCIN) 100 mg in dextrose 5 % 250 mL IVPB Status: Discontinued  100 mg  125 mL/hr over 120 Minutes  Intravenous  Every 12 hours  02/24/14 1756  03/01/14 1521    02/24/14 1800   cefTRIAXone (ROCEPHIN) 1 g in dextrose 5 % 50 mL IVPB Status: Discontinued  1 g  100 mL/hr over 30 Minutes  Intravenous  Every 24 hours  02/24/14 1231  02/24/14 1756    02/24/14 1200   vancomycin (VANCOCIN) 1,500 mg in sodium chloride 0.9 % 250 mL IVPB Status: Discontinued  1,500 mg  250 mL/hr over 60 Minutes  Intravenous  Once  02/23/14 1423  02/24/14 1231    02/20/14 2200   vancomycin (VANCOCIN) 1,500 mg  in sodium chloride 0.9 % 500 mL IVPB Status: Discontinued  1,500 mg  250 mL/hr over 120 Minutes  Intravenous  Every 24 hours  03/11/2014 2119  02/22/14 1116    02/26/2014 2200   vancomycin (VANCOCIN) 2,000 mg in sodium chloride 0.9 % 500 mL IVPB  2,000 mg  250 mL/hr over 120 Minutes  Intravenous  Once  02/20/2014 2119  02/20/14 0005       HPI/Subjective: alert  Objective: Filed Vitals:   03/23/14 0509  BP: 77/32  Pulse: 61  Temp: 98.5 F (36.9 C)  Resp: 20    Intake/Output Summary (Last 24 hours) at 03/23/14 0807 Last data filed at 03/22/14 2300  Gross per 24 hour  Intake    960 ml  Output   -669 ml  Net   1629 ml   Filed Weights   03/21/14 2102 03/22/14 0701 03/22/14 1058  Weight: 221.9 kg (489 lb 3.2 oz) 229 kg (504 lb 13.7 oz) 229.7 kg (506 lb 6.4 oz)    Exam:   General:  alert  Cardiovascular: s1,s2 rrr  Respiratory: CTA BL  Abdomen: soft, obese, NT  Musculoskeletal: LE edema   Data Reviewed: Basic Metabolic Panel:  Recent Labs Lab 03/18/14 0815 03/19/14 0725 03/20/14 0635 03/21/14 1200 03/22/14 0617  NA 132* 131* 135* 132* 131*  K 4.2 4.2 3.9 4.2 4.9  CL 86* 86* 89* 85* 87*  CO2 20 19 22 19 19   GLUCOSE 139*  143* 120* 140* 128*  BUN 41* 48* 35* 44* 53*  CREATININE 4.18* 4.75* 3.86* 4.76* 5.24*  CALCIUM 9.1 9.1 8.4 8.8 8.9  PHOS 5.4* 5.7* 4.8* 6.0* 6.8*   Liver Function Tests:  Recent Labs Lab 03/18/14 0815 03/19/14 0725 03/20/14 0635 03/21/14 1200 03/22/14 0617  ALBUMIN 1.8* 1.7* 1.5* 1.7* 1.5*   No results found for this basename: LIPASE, AMYLASE,  in the last 168 hours No results found for this basename: AMMONIA,  in the last 168 hours CBC:  Recent Labs Lab 03/16/14 1005 03/17/14 0500 03/18/14 0815 03/19/14 0725 03/22/14 0717  WBC 9.3 10.0 12.2* 12.2* 13.2*  NEUTROABS  --  7.1 9.0* 9.4*  --   HGB 9.2* 8.5* 8.5* 8.6* 7.2*  HCT 29.0* 26.9* 26.7* 27.1* 22.7*  MCV 90.6 89.4 88.1 88.9 89.0  PLT 117* 110* 130* 144* 114*   Cardiac Enzymes: No results found for this basename: CKTOTAL, CKMB, CKMBINDEX, TROPONINI,  in the last 168 hours BNP (last 3 results)  Recent Labs  02/26/2014 1220  PROBNP 937.1*   CBG:  Recent Labs Lab 03/21/14 1709 03/22/14 1213 03/22/14 1718 03/22/14 2115 03/23/14 0748  GLUCAP 141* 122* 111* 124* 128*    Recent Results (from the past 240 hour(s))  WOUND CULTURE     Status: None   Collection Time    03/16/14 10:12 AM      Result Value Ref Range Status   Specimen Description WOUND LEG LEFT   Final   Special Requests NONE   Final   Gram Stain     Final   Value: RARE WBC PRESENT, PREDOMINANTLY PMN     NO SQUAMOUS EPITHELIAL CELLS SEEN     ABUNDANT GRAM NEGATIVE RODS     MODERATE GRAM POSITIVE COCCI     IN PAIRS   Culture     Final   Value: ABUNDANT ACINETOBACTER CALCOACETICUS/BAUMANNII COMPLEX     Note: COLISTIN SENSITIVE 0.38ug/mL TIGECYCLINE 2 ML/ug     ABUNDANT METHICILLIN RESISTANT STAPHYLOCOCCUS AUREUS  Note: RIFAMPIN AND GENTAMICIN SHOULD NOT BE USED AS SINGLE DRUGS FOR TREATMENT OF STAPH INFECTIONS. CRITICAL RESULT CALLED TO, READ BACK BY AND VERIFIED WITH: ASHLEY LARSON @ 9:58AM 03/21/14 BY DWEEKS     Performed at [facility]   Report Status 03/22/2014 FINAL   Final   Organism ID, Bacteria ACINETOBACTER CALCOACETICUS/BAUMANNII COMPLEX   Final   Organism ID, Bacteria METHICILLIN RESISTANT STAPHYLOCOCCUS AUREUS   Final     Studies: Dg Chest Port 1 View  03/21/2014   CLINICAL DATA:  Breath  EXAM: PORTABLE CHEST - 1 VIEW  COMPARISON:  DG CHEST 1V PORT dated 02/22/2014  FINDINGS: There is a right-sided dual-lumen central venous catheter in satisfactory position.  There is bilateral diffuse interstitial thickening. There is no pleural effusion or pneumothorax. There is a persistent right lower lobe pulmonary nodule. There is no other focal parenchymal opacity. There is enlargement of the central pulmonary vasculature. He cardiomediastinal silhouette is stable. The osseous structures are unremarkable.  IMPRESSION: 1. Findings of concerning for mild interstitial edema. 2. Stable right lower lobe pulmonary nodule.   Electronically Signed   By: Kathreen Devoid   On: 03/21/2014 23:41    Scheduled Meds: . bisacodyl  10 mg Rectal Daily  . docusate sodium  200 mg Oral BID  . feeding supplement (NEPRO CARB STEADY)  237 mL Oral BID BM  . heparin subcutaneous  5,000 Units Subcutaneous 3 times per day  . insulin aspart  0-9 Units Subcutaneous TID WC  . lactulose  30 g Oral BID  . midodrine  10 mg Oral TID WC  .  morphine injection  1 mg Intravenous Once  . multivitamin  1 tablet Oral QHS  . pantoprazole  40 mg Oral Q1200  . polyethylene glycol  17 g Oral Daily  . senna  2 tablet Oral Daily  . sodium bicarbonate  1,300 mg Oral TID  . sodium chloride  500 mL Intravenous Once  . sodium chloride  3 mL Intravenous Q12H   Continuous Infusions: . sodium chloride Stopped (03/15/14 0700)    Active Problems:   NHL (non-Hodgkin's lymphoma)   HTN (hypertension)   Dyslipidemia   Anemia of chronic disease   Morbid obesity   Lower GI bleed   Diabetes mellitus   AKI (acute kidney injury)   Hypotension   Non-healing left groin  open wound   Obstructive sleep apnea    Time spent: >35 minutes     [name]  [facility] Pager [phone]. If 7PM-7AM, please contact night-coverage at [email or web address], password [facility] 03/23/2014, 8:07 AM  LOS: 32 days

## 2014-03-23 NOTE — Progress Notes (Signed)
Pt returned from ultrasound with dressing on left groin removed. Removed packing per protocol. Unable to visualize base of tunnel even with flashlight. Wound filled with large amount of foul, black fluid. Rinsed wound with NS until fluid running clear and placed 1 piece NS moistened kerlex in wound, covered with dry 4x4, Dime to quarter sized blood clots x4 noted in drainage. Tunneling noted to be at 9.5 cm deep. Pt tolerated procedure well, no cx. Will continue to monitor.

## 2014-03-23 NOTE — Progress Notes (Signed)
Assessment  5M with AKI(creat 0.9 on 01/18/14) in setting of ARB, nonhealing inguinal wound, and s/p treatment for NHL.  1. AKI, normal baseline GFR. Currently dialysis dependent. 2. NHL recent active therapy 3. Nonhealing L inguinal wound draining 4. Hypertension, now hypotensive with hypoalbuminemia and severe intravascular volume depletion.  I agree with volume expansion per CCM 5. Anemia, s/p PRBCs 6. Diabetes Mellitus  Subjective: Interval History: Low BP persists  Objective: Vital signs in last 24 hours: Temp:  [97.3 F (36.3 C)-98.5 F (36.9 C)] 97.3 F (36.3 C) (05/05 1028) Pulse Rate:  [61-99] 61 (05/05 0509) Resp:  [18-20] 20 (05/05 0509) BP: (68-104)/(32-66) 68/36 mmHg (05/05 1000) SpO2:  [94 %-96 %] 94 % (05/05 0509) Weight change: 7.1 kg (15 lb 10.4 oz)  Intake/Output from previous day: 05/04 0701 - 05/05 0700 In: 960 [P.O.:290; Blood:670] Out: -669 [Stool:1] Intake/Output this shift:   General appearance: alert and cooperative GI: obese and protuberant No edema  Lab Results:  Recent Labs  03/22/14 0717  WBC 13.2*  HGB 7.2*  HCT 22.7*  PLT 114*   BMET:  Recent Labs  03/21/14 1200 03/22/14 0617  NA 132* 131*  K 4.2 4.9  CL 85* 87*  CO2 19 19  GLUCOSE 140* 128*  BUN 44* 53*  CREATININE 4.76* 5.24*  CALCIUM 8.8 8.9   No results found for this basename: PTH,  in the last 72 hours Iron Studies: No results found for this basename: IRON, TIBC, TRANSFERRIN, FERRITIN,  in the last 72 hours Studies/Results: No results found.  Scheduled: . bisacodyl  10 mg Rectal Daily  . docusate sodium  200 mg Oral BID  . feeding supplement (NEPRO CARB STEADY)  237 mL Oral BID BM  . heparin subcutaneous  5,000 Units Subcutaneous 3 times per day  . insulin aspart  0-9 Units Subcutaneous TID WC  . lactulose  30 g Oral BID  . midodrine  10 mg Oral TID WC  .  morphine injection  1 mg Intravenous Once  . multivitamin  1 tablet Oral QHS  . pantoprazole  40 mg Oral  Q1200  . polyethylene glycol  17 g Oral Daily  . senna  2 tablet Oral Daily  . sodium bicarbonate  1,300 mg Oral TID  . sodium chloride  3 mL Intravenous Q12H     LOS: 32 days   Estanislado Emms 03/23/2014,11:04 AM

## 2014-03-23 NOTE — Progress Notes (Signed)
PULMONARY / CRITICAL CARE MEDICINE   Name: Douglas [name] MRN: [id number] DOB: [birth year]    ADMISSION DATE:  03-12-14 CONSULTATION DATE:  2014-03-12  REFERRING MD : Daleen Bo  PRIMARY SERVICE: TRH  CHIEF COMPLAINT: Hypotension  BRIEF PATIENT DESCRIPTION:   58 yo M with pmh of follicular low-grade non-Hodgkin's lymphoma with large left groin lymphadenopathy diagnosed in 2009 with recent chemotherapy and admission for AKI 3/17-3/20 who presented as a transfer from [place] with 1.5 week history of shortness of breath found to have acute renal failure requiring emergent CRRT.   SIGNIFICANT EVENTS / STUDIES:  4/3 ARF requiring emergent CRRT.4/4, Renal US negative.  4/4 ARF thought to be due to ATN from severe sepsis, per oncology not tumor lysis syndrome. No DVT, heparin gtt stopped.  4/5 Continue CRRT, to transition to HD, no renal recovery yet. Hyperuricemia resolved. Dialysis access is a right IJ dialysis catheter  4/6 .Wound vac placed  4/7 -hemodialysis initiated   LINES / TUBES: HD RIJ catheter 4/3 >4/20 HD RIJ permacath 4/20 >>>  CULTURES: MRSA PCR 4/3 Postive Wound 4/3> neg BC 4/5>>neg Wound 4/28 >>> Abundant MRSA and Acinetobacter- MDR - both sensitive to [place]  ANTIBIOTICS: Vanc 4/3 >>  HPI:  SUBJECTIVE:   58 yo M with pmh of follicular low-grade non-Hodgkin's lymphoma with large left groin lymphadenopathy diagnosed in 2009 with recent chemotherapy and admission for AKI 3/17-3/20 who presented as a transfer from [place] with 1.5 week history of shortness of breath found to have acute renal failure requiring emergent CRRT. Was started on HD 4/7. It was discovered that his leg wound was growing GNR so he was placed on doxy and cipro 4/9. He was also intermittently hypotensive post HD and d/t large amounts of drainage from wound (1-2L/day) Surgery did not recommend further intervention at that time. With continued dialysis, nephrology was still hopeful that his GFR  would be able to recover. He had tunneled HD cath placed 4/20 and it was at that time thought that his kidney dysfunction had transitioned to ESRD. 4/25 it appeared as though he was stable for discharge to SNF and care management began the search for placement. 4/28 the wound care nurse noticed a change in the appearance of his leg wound. Surgery was consulted and d/c'd the wound vac and sent cultures. Cultures were positive for acinetobacter and MRSA, however ID recommended to monitor off ABX. 5/5 he is hypotensive and has received several fluid boluses. Primary team does not feel comfortable with further IVF resuscitation so PCCM has been asked to see for further evaluation.   VITAL SIGNS: Temp:  [97.8 F (36.6 C)-98.5 F (36.9 C)] 98.5 F (36.9 C) (05/05 0509) Pulse Rate:  [61-102] 61 (05/05 0509) Resp:  [18-22] 20 (05/05 0509) BP: (77-110)/(32-66) 77/32 mmHg (05/05 0509) SpO2:  [92 %-96 %] 94 % (05/05 0509) Weight:  [229.7 kg (506 lb 6.4 oz)] 229.7 kg (506 lb 6.4 oz) (05/04 1058) HEMODYNAMICS:   VENTILATOR SETTINGS:   INTAKE / OUTPUT: Intake/Output     05/04 0701 - 05/05 0700 05/05 0701 - 05/06 0700   P.O. 290    Blood 670    Total Intake(mL/kg) 960 (4.2)    Other -670    Stool 1    Total Output -669     Net +1629          Stool Occurrence 3 x      PHYSICAL EXAMINATION: General: NAD, morbidly obese Neuro: Moving extremities, normal strength   HEENT:  Normocephalic  Cardiovascular:  Normal rate and rhythm, distant heart sounds Lungs: clear to ausculation anteriorly  Abdomen: lower abdominal tenderness, normal BS, no guarding Musculoskeletal:  BLE edema Skin:  Wound to L groin  LABS:  CBC  Recent Labs Lab 03/18/14 0815 03/19/14 0725 03/22/14 0717  HGB 8.5* 8.6* 7.2*  HCT 26.7* 27.1* 22.7*  WBC 12.2* 12.2* 13.2*  PLT 130* 144* 114*    COAGULATION No results found for this basename: INR,  in the last 168 hours  CARDIAC   No results found for this basename:  TROPONINI,  in the last 168 hours No results found for this basename: PROBNP,  in the last 168 hours   CHEMISTRY  Recent Labs Lab 03/18/14 0815 03/19/14 0725 03/20/14 0635 03/21/14 1200 03/22/14 0617  NA 132* 131* 135* 132* 131*  K 4.2 4.2 3.9 4.2 4.9  CL 86* 86* 89* 85* 87*  CO2 20 19 22 19 19   GLUCOSE 139* 143* 120* 140* 128*  BUN 41* 48* 35* 44* 53*  CREATININE 4.18* 4.75* 3.86* 4.76* 5.24*  CALCIUM 9.1 9.1 8.4 8.8 8.9  PHOS 5.4* 5.7* 4.8* 6.0* 6.8*   Estimated Creatinine Clearance: 30.2 ml/min (by C-G formula based on Cr of 5.24).   LIVER  Recent Labs Lab 03/18/14 0815 03/19/14 0725 03/20/14 0635 03/21/14 1200 03/22/14 0617  ALBUMIN 1.8* 1.7* 1.5* 1.7* 1.5*     INFECTIOUS No results found for this basename: LATICACIDVEN, PROCALCITON,  in the last 168 hours   ENDOCRINE CBG (last 3)   Recent Labs  03/22/14 1718 03/22/14 2115 03/23/14 0748  GLUCAP 111* 124* 128*      ASSESSMENT / PLAN:  A: Hypotension likely secondary to ESRD/HD and leg wound as primary team has indicated. Does not meet SIRS criteria so sepsis is unlikely etiology. His mental status appears to be at baseline, and accounting for ESRD, is does not appear that there is any end-organ malperfusion. Manual BP assessment elicits SBP's in the high 90's in bilateral upper extremities.  ESRD L Groin Wound DM 2  Recs: -Monitor mental status closely for decline, if declined then that is a good indication that BP is truly low.   -Place on telemetry -Assess BP manually as arm girth may inhibit the accuracy of automatic NIBP monitoring.  -Other management per primary team/renal/surgery -PCCM will be available as needed. -Also there is significant volume loss from his drain, and currently appears volume depleted.  Please insure adequate volume resuscitation. -Recommend discussing goals of care as patient's functional status is very poor and will only get worse with such large protein loss from  drains.  Patient seen and examined, above note updated in full.  Rush Farmer, M.D. [facility] Pulmonary/Critical Care Medicine. Pager: [phone]. After hours pager: [phone].

## 2014-03-23 NOTE — Progress Notes (Signed)
[facility] PROGRESS NOTE  Douglas [name] [id number] DOB: [birth year] DOA: 02/24/2014 PCP: [name], MD   [place]: IMPRESSION:  3.5 x 1.8 x 3.7 cm complex fluid collection within the subcutaneous  tissues of the left groin. This could reflect an abscess or  hematoma. There is overlying subcutaneous edema and skin thickening  -called surgical evaluation possible drainage vs I&D; placed IR evaluation    [name]  [facility] Pager [phone]. If 7PM-7AM, please contact night-coverage at [email or web address], password [facility] 03/23/2014, 5:04 PM  LOS: 32 days

## 2014-03-23 NOTE — Progress Notes (Signed)
Pt repeatedly falling asleep in middle of conversation. Denies feeling like things are "fading" or that he is having trouble staying awake. Eyes roll back in head, but pt is immediately arousable and appropriate. Will continue to monitor. BP 80/36 manually, scheduled midodrine given. Will continue to monitor.

## 2014-03-23 NOTE — Consult Note (Signed)
WOC visited with patient, since dressing changes implemented he is staying more wet in the groin as the dressings get saturated.  The drainage is still heavy and foul smelling.  I asked the patient did he like the management of the lymphatic leak better with the [facility], he states "whatever keeps me dry".  I have discussed this will bedside nurse, reports the drainage is still foul and brown.  I then contacted the surgery team to advise them of same, they are ok with replacing the VAC to manage this drainage despite the characteristics of the drainage.  Ordered supplies for replacement of NPWT VAC dressing to the left groin wound tom, in order to place on M/W/F schedule.  Gracin Soohoo Devens RN,CWOCN [phone]

## 2014-03-23 NOTE — Progress Notes (Signed)
PT Cancellation Note  Patient Details Name: Douglas [name] MRN: [id number] DOB: September 26, 1956   Cancelled Treatment:     Pt is hypotensive; supine in bed; Will hold PT intervention today Please see also Dr. Atilano Ina and Daine Floras, RN's notes;   Thank you,  [name], PT  Acute Rehabilitation Services Pager [phone] Office [phone]    Mila Homer [place] 03/23/2014, 10:12 AM

## 2014-03-23 NOTE — Progress Notes (Signed)
[facility] PROGRESS NOTE  Douglas [name] [id number] DOB: 05/30/1956 DOA: 03/01/2014 PCP: [name], MD  Patient is still intermittently hypotensive despite IV boluses; He is is on HD unfortunately cannot give more fluids;   -called PCCM, patient likely needs IV pressor support     [name]  [facility] Pager [phone]. If 7PM-7AM, please contact night-coverage at [email or web address], password [facility] 03/23/2014, 10:08 AM  LOS: 32 days

## 2014-03-24 ENCOUNTER — Inpatient Hospital Stay (HOSPITAL_COMMUNITY): Payer: BC Managed Care – PPO

## 2014-03-24 DIAGNOSIS — IMO0002 Reserved for concepts with insufficient information to code with codable children: Secondary | ICD-10-CM

## 2014-03-24 LAB — RENAL FUNCTION PANEL
ALBUMIN: 1.5 g/dL — AB (ref 3.5–5.2)
BUN: 45 mg/dL — AB (ref 6–23)
CO2: 21 mEq/L (ref 19–32)
Calcium: 8.2 mg/dL — ABNORMAL LOW (ref 8.4–10.5)
Chloride: 91 mEq/L — ABNORMAL LOW (ref 96–112)
Creatinine, Ser: 4.81 mg/dL — ABNORMAL HIGH (ref 0.50–1.35)
GFR calc Af Amer: 14 mL/min — ABNORMAL LOW (ref >90)
GFR calc non Af Amer: 12 mL/min — ABNORMAL LOW (ref >90)
Glucose, Bld: 150 mg/dL — ABNORMAL HIGH (ref 70–99)
Phosphorus: 6 mg/dL — ABNORMAL HIGH (ref 2.3–4.6)
Potassium: 4.3 mEq/L (ref 3.7–5.3)
SODIUM: 135 meq/L — AB (ref 137–147)

## 2014-03-24 LAB — CBC
HCT: 28.5 % — ABNORMAL LOW (ref 39.0–52.0)
Hemoglobin: 9.3 g/dL — ABNORMAL LOW (ref 13.0–17.0)
MCH: 28.7 pg (ref 26.0–34.0)
MCHC: 32.6 g/dL (ref 30.0–36.0)
MCV: 88 fL (ref 78.0–100.0)
Platelets: 84 10*3/uL — ABNORMAL LOW (ref 150–400)
RBC: 3.24 MIL/uL — ABNORMAL LOW (ref 4.22–5.81)
RDW: 19.6 % — ABNORMAL HIGH (ref 11.5–15.5)
WBC: 11.3 10*3/uL — ABNORMAL HIGH (ref 4.0–10.5)

## 2014-03-24 LAB — PROTIME-INR
INR: 1.02 (ref 0.00–1.49)
Prothrombin Time: 13.2 s (ref 11.6–15.2)

## 2014-03-24 LAB — GLUCOSE, CAPILLARY
GLUCOSE-CAPILLARY: 141 mg/dL — AB (ref 70–99)
GLUCOSE-CAPILLARY: 127 mg/dL — AB (ref 70–99)
Glucose-Capillary: 135 mg/dL — ABNORMAL HIGH (ref 70–99)
Glucose-Capillary: 141 mg/dL — ABNORMAL HIGH (ref 70–99)
Glucose-Capillary: 138 mg/dL — ABNORMAL HIGH (ref 70–99)

## 2014-03-24 MED ORDER — ALBUMIN HUMAN 5 % IV SOLN
12.5000 g | Freq: Four times a day (QID) | INTRAVENOUS | Status: DC
  Filled 2014-03-24 (×4): qty 250

## 2014-03-24 MED ORDER — ALBUMIN HUMAN 5 % IV SOLN
12.5000 g | Freq: Four times a day (QID) | INTRAVENOUS | Status: AC
  Administered 2014-03-24 – 2014-03-26 (×7): 12.5 g via INTRAVENOUS
  Filled 2014-03-24 (×10): qty 250

## 2014-03-24 NOTE — Progress Notes (Signed)
[facility]                                                                              Patient Demographics  Douglas [name], is a 58 y.o. male, DOB - 10/22/1956, PF:[id number]  Admit date - 02/28/2014   Admitting Physician Chesley Mires, MD  Outpatient Primary MD for the patient is [name], MD  LOS - 88   Chief Complaint  Patient presents with  . Shortness of Breath     Brief summary Patient is a 58 year old male with history of follicular low-grade non-Hodgkin lymphoma, large left groin lymphadenopathy, diagnosed in 2000 and was on recent chemotherapy, had admission for acute renal failure 3/17- 3/20. He was suspected to have acute renal failure de to HCTZ and ARB.  He had outpatient left-sided inguinal node biopsy after which he has developed a large lymph filled seroma on the left groin requiring wound VAC placement and continuous drainage. Have discussed his case multiple times with general surgery who do not recommend any further surgical intervention. Once he has a rehabilitation bed he could be placed in a rehabilitation as he requires intermittent IV fluid boluses for ongoing fluid loss due to daily lymph drainage from the wound .   Assessment & Plan   Acute kidney injury potentially related to tumor lysis, now end-stage renal disease -Nephrology consulted and appreciated -currently has Right IJ dialysis catheter  Hodgkin's lymphoma with follicular low-grade, large left groin lymphadenopathy status post chemotherapy/hyperuricemia -s/p Bendamustine and rituximab 02/03/14  -Hyperuricemia resolved, oncology following (Dr. Jana Hakim)- recommendations: stable G6PD, LDH levels are around 600 which is twice normal, transfuse PRBCs as needed. CT abdomen for restaging outpatient home along with outpatient followup with oncology post discharge.   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 -Consulted IR for possible drainage, will reculture  Intermittent tachycardia and hypotension -Likely due to removal of fluid during hemodialysis versus ongoing loss from left inguinal -Spoke with Dr. Florene Glen, nephrologist, will try to use albumin to expand volume, will hold off of dialysis for now, continue IV fluid and transfusion if needed -Continue midodrine -PCCM consulted and does not recommend pressors at this time due to patient being asymptomatic  Type 2 diabetes mellitus  -Continue insulin sliding scale with CBG monitoring  GERD -Continue PPI  Constipation -Continue bowel regimen  Code Status: Full  Family Communication: None at bedside  Disposition Plan: Admitted, possible LTAC placement  Time Spent in minutes   30 minutes  Procedures/Significant Events 4/3 Hyperkalemia, hyperuricemia, hyperphosphatemia, with ARF requiring emergent CRRT. UA with urate crystals. Renal US normal. No pulmonary edema on CXR.  4/4 ARF thought to be due to ATN from severe sepsis, per oncology not tumor lysis syndrome. No DVT, heparin gtt stopped. 2D-echo normal EF  4/5 Continue CRRT, to transition to HD, no renal recovery yet. Hyperuricemia resolved. Dialysis access is a right IJ dialysis catheter  4/6 .Wound vac placed  4/7 -hemodialysis initiated  5/6 IR to drain left inguinal fluid collection  5/6 IR to place central line   Consults   Nephrology Interventional radiology PCCM Oncology  DVT Prophylaxis  Heparin (will change due to wound) Will  place on SCDS  Lab Results  Component Value Date   PLT 84* 03/24/2014    Medications  Scheduled Meds: . bisacodyl  10 mg Rectal Daily  . docusate sodium  200 mg Oral BID  . feeding supplement (NEPRO CARB STEADY)  237 mL Oral BID BM  . heparin subcutaneous  5,000 Units Subcutaneous 3 times per day  . insulin aspart  0-9 Units Subcutaneous TID WC  . lactulose  30 g Oral BID  . midodrine  10 mg Oral TID WC  .  morphine injection  1 mg Intravenous Once  . multivitamin  1 tablet Oral QHS  . pantoprazole  40 mg Oral Q1200  . polyethylene glycol  17 g Oral Daily  . senna  2 tablet Oral Daily  . sodium bicarbonate  1,300 mg Oral TID  . sodium chloride  3 mL Intravenous Q12H   Continuous Infusions: . sodium chloride Stopped (03/15/14 0700)   PRN Meds:.diphenhydrAMINE, guaiFENesin-dextromethorphan, heparin, hydrOXYzine, oxyCODONE-acetaminophen, simethicone, sodium chloride, sodium chloride  Antibiotics    Anti-infectives   Start     Dose/Rate Route Frequency Ordered Stop   02/22/2014 0953  ceFAZolin (ANCEF) 1-5 GM-% IVPB    Comments:  [name]   : cabinet override      03/11/2014 [id number] 02/21/2014 2159   03/07/2014 0953  ceFAZolin (ANCEF) 2-3 GM-% IVPB SOLR    Comments:  [name]   : cabinet override      03/17/2014 0953 03/09/2014 2159   02/28/2014 0600  [MAR Hold]  ceFAZolin (ANCEF) 3 g in dextrose 5 % 50 mL IVPB     (On MAR Hold since 03/14/2014 0850)   3 g 160 mL/hr over 30 Minutes Intravenous On call 03/07/14 1556 02/26/2014 1021   02/26/2014 0000  ceFAZolin (ANCEF) IVPB 1 g/50 mL premix  Status:  Discontinued    Comments:  Send with pt to OR   1 g 100 mL/hr over 30 Minutes Intravenous On call 03/07/14 1540 03/07/14 1555   03/01/14 1800  doxycycline (VIBRA-TABS) tablet 100 mg  Status:  Discontinued     100 mg Oral Every 12 hours 03/01/14 1522 03/10/14 1119   03/01/14 0800  ciprofloxacin (CIPRO) tablet 500 mg  Status:  Discontinued     500 mg Oral Daily with breakfast 02/28/14 1106  03/10/14 1119   02/26/14 0000  doxycycline (VIBRAMYCIN) 100 MG capsule     100 mg Oral 2 times daily 02/26/14 1153     02/26/14 0000  ciprofloxacin (CIPRO) 500 MG tablet     500 mg Oral 2 times daily 02/26/14 1153     02/25/14 1100  ciprofloxacin (CIPRO) IVPB 400 mg  Status:  Discontinued     400 mg 200 mL/hr over 60 Minutes Intravenous Every 12 hours 02/25/14 1047 02/28/14 1106   02/24/14 1830  doxycycline (VIBRAMYCIN) 100 mg in dextrose 5 % 250 mL IVPB  Status:  Discontinued     100 mg 125 mL/hr over 120 Minutes Intravenous Every 12 hours 02/24/14 1756 03/01/14 1521   02/24/14 1800  cefTRIAXone (ROCEPHIN) 1 g in dextrose 5 %  50 mL IVPB  Status:  Discontinued     1 g 100 mL/hr over 30 Minutes Intravenous Every 24 hours 02/24/14 1231 02/24/14 1756   02/24/14 1200  vancomycin (VANCOCIN) 1,500 mg in sodium chloride 0.9 % 250 mL IVPB  Status:  Discontinued     1,500 mg 250 mL/hr over 60 Minutes Intravenous  Once 02/23/14 1423 02/24/14 1231   02/20/14 2200  vancomycin (VANCOCIN) 1,500 mg in sodium chloride 0.9 % 500 mL IVPB  Status:  Discontinued     1,500 mg 250 mL/hr over 120 Minutes Intravenous Every 24 hours 03/07/14 2119 02/22/14 1116   03-07-2014 2200  vancomycin (VANCOCIN) 2,000 mg in sodium chloride 0.9 % 500 mL IVPB     2,000 mg 250 mL/hr over 120 Minutes Intravenous  Once 2014/03/07 2119 02/20/14 0005        Subjective:   Costella Hatcher seen and examined today.  Patient complains of left inguinal pain, particularly with movement of his leg. Currently denies any chest pain, shortness of breath, dizziness.  Objective:   Filed Vitals:   03/23/14 1616 03/23/14 2147 03/23/14 2236 03/24/14 0530  BP: 80/34 70/34 85/41  77/36  Pulse:  100 102 108  Temp:  98.7 F (37.1 C)  98.8 F (37.1 C)  TempSrc:  Oral  Oral  Resp:  21  18  Height:      Weight:      SpO2:  93%  92%    Wt Readings from Last 3 Encounters:  03/22/14 229.7 kg (506 lb 6.4 oz)  03/22/14 229.7 kg (506 lb 6.4  oz)  02/05/14 183.435 kg (404 lb 6.4 oz)    No intake or output data in the 24 hours ending 03/24/14 0818  Exam  General: Well developed, morbidly obese, NAD   HEENT: NCAT, PERRLA, EOMI, Anicteic Sclera, mucous membranes moist.  Neck: Supple, no JVD, no masses  Cardiovascular: S1 S2 auscultated, distant heart sounds  Respiratory: Clear to auscultation bilaterally with equal chest rise  Abdomen: Soft, nontender, nondistended, + bowel sounds  Extremities: warm dry without cyanosis clubbing, bilateral lower extremity edema  Neuro: AAOx3, cranial nerves grossly intact. Able to move all 4 extremities  Skin: Open wound left inguinal area  Psych: Normal affect and demeanor with intact judgement and insight  Data Review   Micro Results Recent Results (from the past 240 hour(s))  WOUND CULTURE     Status: None   Collection Time    03/16/14 10:12 AM      Result Value Ref Range Status   Specimen Description WOUND LEG LEFT   Final   Special Requests NONE   Final   Gram Stain     Final   Value: RARE WBC PRESENT, PREDOMINANTLY PMN     NO SQUAMOUS EPITHELIAL CELLS SEEN     ABUNDANT GRAM NEGATIVE RODS     MODERATE GRAM POSITIVE COCCI     IN PAIRS   Culture     Final   Value: ABUNDANT ACINETOBACTER CALCOACETICUS/BAUMANNII COMPLEX     Note: COLISTIN SENSITIVE 0.38ug/mL TIGECYCLINE 2 ML/ug     ABUNDANT METHICILLIN RESISTANT STAPHYLOCOCCUS AUREUS     Note: RIFAMPIN AND GENTAMICIN SHOULD NOT BE USED AS SINGLE DRUGS FOR TREATMENT OF STAPH INFECTIONS. CRITICAL RESULT CALLED TO, READ BACK BY AND VERIFIED WITH: ASHLEY LARSON @ 9:58AM 03/21/14 BY DWEEKS     Performed at [facility]   Report Status 03/22/2014 FINAL   Final   Organism ID, Bacteria ACINETOBACTER CALCOACETICUS/BAUMANNII COMPLEX  Final   Organism ID, Bacteria METHICILLIN RESISTANT STAPHYLOCOCCUS AUREUS   Final    Radiology Reports US Pelvis Limited  03/23/2014   CLINICAL DATA:  Left groin palpable lump post cardiac  catheterization. Evaluate for abscess.  EXAM: US PELVIS LIMITED  TECHNIQUE: Ultrasound examination of the pelvic soft tissues was performed in the area of clinical concern.  COMPARISON:  None.  FINDINGS: From imaging of the left groin in the region of patient concern is performed. There is skin thickening and diffuse subcutaneous bands of edema. Deep to the subcutaneous bands of edema is a focal discrete complex fluid collection that measures 3.5 x 1.8 x 3.7 cm. No significant internal vascular flow is seen. There is some flow around the periphery of this collection.  IMPRESSION: 3.5 x 1.8 x 3.7 cm complex fluid collection within the subcutaneous tissues of the left groin. This could reflect an abscess or hematoma. There is overlying subcutaneous edema and skin thickening.   Electronically Signed   By: Curlene Dolphin M.D.   On: 03/23/2014 15:49   Dg Chest Port 1 View  03/21/2014   CLINICAL DATA:  Breath  EXAM: PORTABLE CHEST - 1 VIEW  COMPARISON:  DG CHEST 1V PORT dated 03/07/2014  FINDINGS: There is a right-sided dual-lumen central venous catheter in satisfactory position.  There is bilateral diffuse interstitial thickening. There is no pleural effusion or pneumothorax. There is a persistent right lower lobe pulmonary nodule. There is no other focal parenchymal opacity. There is enlargement of the central pulmonary vasculature. He cardiomediastinal silhouette is stable. The osseous structures are unremarkable.  IMPRESSION: 1. Findings of concerning for mild interstitial edema. 2. Stable right lower lobe pulmonary nodule.   Electronically Signed   By: Kathreen Devoid   On: 03/21/2014 23:41   Dg Chest Port 1 View  03/18/2014   CLINICAL DATA:  Status post catheter placement  EXAM: PORTABLE CHEST - 1 VIEW  COMPARISON:  DG CHEST 1V PORT dated 02/24/2014; CT CHEST W/CM dated 12/02/2013; DG CHEST 1 VIEW dated 02/03/2014  FINDINGS: There is a dual-lumen right-sided central venous catheter in satisfactory position. There is a  2.8 cm right lower lobe pulmonary nodule. There is no other focal consolidation, pleural effusion or pneumothorax. Stable cardiac silhouette. Bilateral hilar enlargement  The osseous structures are unremarkable.  IMPRESSION: 1. Satisfactory position of a dialysis catheter. 2. Interval enlargement of a right lower lobe pulmonary nodule.   Electronically Signed   By: Kathreen Devoid   On: 02/25/2014 13:11   Dg Chest Port 1 View  02/25/2014   CLINICAL DATA:  Cough and chest pain  EXAM: PORTABLE CHEST - 1 VIEW  COMPARISON:  02/23/2014  FINDINGS: Unchanged positioning of right IJ catheter, tip at the lower SVC. Unchanged heart size and mediastinal contours.  No edema, effusion, consolidation, or pneumothorax. Right-sided pulmonary parenchymal nodularity is again noted, with a dominant 2 cm nodule in the lower right chest, likely representing a middle lobe nodule seen on previous chest CT 12/02/2013. These are stable from recent comparison examinations, but likely progressed from 12/02/2013.  IMPRESSION: 1.  No active disease. 2. Right lung nodularity which is stable compared to recent studies, but enlarged from 12/02/2013 chest CT comparison.   Electronically Signed   By: Jorje Guild M.D.   On: 02/25/2014 01:21   Dg Chest Port 1 View  02/23/2014   CLINICAL DATA:  Respiratory distress.  EXAM: PORTABLE CHEST - 1 VIEW  COMPARISON:  DG CHEST 1V PORT dated 02/20/2014; CT  CHEST W/CM dated 12/02/2013; DG CHEST 1V PORT dated 03/13/2014; DG CHEST 1V PORT dated 08-Mar-2014  FINDINGS: Cardiomediastinal silhouette remains stable and normal. Dialysis catheter tips SVC RA junction, unchanged. No focal infiltrates, although right lung nodular densities persist. Negative osseous structures.  IMPRESSION: No active infiltrates.   Electronically Signed   By: Rolla Flatten M.D.   On: 02/23/2014 08:10   Dg Fluoro Guide Cv Line-no Report  03/09/2014   CLINICAL DATA: insertion diatek in OR# 12   FLOURO GUIDE CV LINE  Fluoroscopy was utilized by  the requesting physician.  No radiographic  interpretation.     CBC  Recent Labs Lab 03/18/14 0815 03/19/14 0725 03/22/14 0717 03/23/14 1511 03/24/14 0713  WBC 12.2* 12.2* 13.2* 12.1* PENDING  HGB 8.5* 8.6* 7.2* 9.2* 9.3*  HCT 26.7* 27.1* 22.7* 27.7* 28.5*  PLT 130* 144* 114* 98* 84*  MCV 88.1 88.9 89.0 86.6 88.0  MCH 28.1 28.2 28.2 28.8 28.7  MCHC 31.8 31.7 31.7 33.2 32.6  RDW 19.4* 19.4* 20.1* 19.3* 19.6*  LYMPHSABS 1.6 0.7  --   --   --   MONOABS 1.6* 2.1*  --   --   --   EOSABS 0.0 0.1  --   --   --   BASOSABS 0.0 0.0  --   --   --     Chemistries   Recent Labs Lab 03/20/14 0635 03/21/14 1200 03/22/14 0617 03/23/14 1246 03/24/14 0713  NA 135* 132* 131* 131* 135*  K 3.9 4.2 4.9 5.0 4.3  CL 89* 85* 87* 91* 91*  CO2 22 19 19 19 21   GLUCOSE 120* 140* 128* 132* 150*  BUN 35* 44* 53* 39* 45*  CREATININE 3.86* 4.76* 5.24* 3.99* 4.81*  CALCIUM 8.4 8.8 8.9 7.8* 8.2*   ------------------------------------------------------------------------------------------------------------------ estimated creatinine clearance is 32.9 ml/min (by C-G formula based on Cr of 4.81). ------------------------------------------------------------------------------------------------------------------ No results found for this basename: HGBA1C,  in the last 72 hours ------------------------------------------------------------------------------------------------------------------ No results found for this basename: CHOL, HDL, LDLCALC, TRIG, CHOLHDL, LDLDIRECT,  in the last 72 hours ------------------------------------------------------------------------------------------------------------------ No results found for this basename: TSH, T4TOTAL, FREET3, T3FREE, THYROIDAB,  in the last 72 hours ------------------------------------------------------------------------------------------------------------------ No results found for this basename: VITAMINB12, FOLATE, FERRITIN, TIBC, IRON, RETICCTPCT,   in the last 72 hours  Coagulation profile No results found for this basename: INR, PROTIME,  in the last 168 hours  No results found for this basename: DDIMER,  in the last 72 hours  Cardiac Enzymes No results found for this basename: CK, CKMB, TROPONINI, MYOGLOBIN,  in the last 168 hours ------------------------------------------------------------------------------------------------------------------ No components found with this basename: POCBNP,     Shizuye Rupert D.O. on 03/24/2014 at 8:18 AM  Between 7am to 7pm - Pager - [phone]  After 7pm go to [email or web address] - password [email or web address]  And look for the night coverage person covering for me after hours  [facility] Office  [phone]

## 2014-03-24 NOTE — Progress Notes (Signed)
Assessment  36M with AKI(creat 0.9 on 01/18/14) in setting of ARB, nonhealing inguinal wound, and s/p treatment for NHL.  1. AKI, normal baseline GFR. Currently dialysis dependent. 2. NHL recent active therapy 3. Nonhealing L inguinal wound draining, bleeding today 4. Hypertension, now hypotensive with hypoalbuminemia and severe intravascular volume depletion. I agree with volume expansion per CCM.  Will add colloid in the form of 5% albumin. Will hold HD today. 5. Anemia, s/p PRBCs 6. Diabetes Mellitus  Subjective: Interval History: Bleeding from groin today  Objective: Vital signs in last 24 hours: Temp:  [97.3 F (36.3 C)-98.8 F (37.1 C)] 98.8 F (37.1 C) (05/06 0530) Pulse Rate:  [100-108] 108 (05/06 0530) Resp:  [18-21] 18 (05/06 0530) BP: (70-85)/(34-41) 72/38 mmHg (05/06 0910) SpO2:  [92 %-93 %] 92 % (05/06 0530) Weight change:   Intake/Output from previous day:   Intake/Output this shift:    General appearance: alert, cooperative and morbidly obese Extremities: right groin bleeding with intervention occurring   Lab Results:  Recent Labs  03/23/14 1511 03/24/14 0713  WBC 12.1* 11.3*  HGB 9.2* 9.3*  HCT 27.7* 28.5*  PLT 98* 84*   BMET:  Recent Labs  03/23/14 1246 03/24/14 0713  NA 131* 135*  K 5.0 4.3  CL 91* 91*  CO2 19 21  GLUCOSE 132* 150*  BUN 39* 45*  CREATININE 3.99* 4.81*  CALCIUM 7.8* 8.2*   No results found for this basename: PTH,  in the last 72 hours Iron Studies: No results found for this basename: IRON, TIBC, TRANSFERRIN, FERRITIN,  in the last 72 hours Studies/Results: US Pelvis Limited  03/23/2014   CLINICAL DATA:  Left groin palpable lump post cardiac catheterization. Evaluate for abscess.  EXAM: US PELVIS LIMITED  TECHNIQUE: Ultrasound examination of the pelvic soft tissues was performed in the area of clinical concern.  COMPARISON:  None.  FINDINGS: From imaging of the left groin in the region of patient concern is performed. There is  skin thickening and diffuse subcutaneous bands of edema. Deep to the subcutaneous bands of edema is a focal discrete complex fluid collection that measures 3.5 x 1.8 x 3.7 cm. No significant internal vascular flow is seen. There is some flow around the periphery of this collection.  IMPRESSION: 3.5 x 1.8 x 3.7 cm complex fluid collection within the subcutaneous tissues of the left groin. This could reflect an abscess or hematoma. There is overlying subcutaneous edema and skin thickening.   Electronically Signed   By: Curlene Dolphin M.D.   On: 03/23/2014 15:49   Scheduled: . bisacodyl  10 mg Rectal Daily  . docusate sodium  200 mg Oral BID  . feeding supplement (NEPRO CARB STEADY)  237 mL Oral BID BM  . heparin subcutaneous  5,000 Units Subcutaneous 3 times per day  . insulin aspart  0-9 Units Subcutaneous TID WC  . lactulose  30 g Oral BID  . midodrine  10 mg Oral TID WC  .  morphine injection  1 mg Intravenous Once  . multivitamin  1 tablet Oral QHS  . pantoprazole  40 mg Oral Q1200  . polyethylene glycol  17 g Oral Daily  . senna  2 tablet Oral Daily  . sodium bicarbonate  1,300 mg Oral TID  . sodium chloride  3 mL Intravenous Q12H     LOS: 33 days   Estanislado Emms 03/24/2014,10:23 AM

## 2014-03-24 NOTE — H&P (Signed)
Douglas [name] is an 58 y.o. male.   Chief Complaint: Pt with known Non Hodkin's Lymphoma L groin lymph node biopsy performed as an OP 1 yr ago Site has had continued off and on infection and swelling Now with more swelling and tenderness and open wound Surgery has seen pt and wound RN seeing pt Wound vac was placed but area seems to be worsening Wound vac removed [place] 5/5 shows fluid collection within subcutaneous tissues Possible abscess vs hematoma Definitely bleeding from area Images reviewed with Dr Annamaria Boots; probable phlegmon vs hematoma Could aspirate only if MD would like--not a candidate for drain placement--no formed collection MD would like aspiration. Pt now scheduled for same Also request for tunneled central catheter. Pt difficult stick; needs access  and long term meds  HPI: NHL; DM; HTN; OSA; LAN- wound L groin; HYPOTENSIVE; acute renal failure  Past Medical History  Diagnosis Date  . Non Hodgkin's lymphoma January 2009  . Diabetes mellitus without complication   . Hypertension   . Sleep apnea   . Lymphadenopathy, inguinal     left    Past Surgical History  Procedure Laterality Date  . Biopsy for lymphoma Left 11/02/13    groin  . Colonoscopy N/A 12/22/2013    Procedure: COLONOSCOPY;  Surgeon: Beryle Beams, MD;  Location: [facility];  Service: Endoscopy;  Laterality: N/A;  . Esophagogastroduodenoscopy N/A 12/22/2013    Procedure: ESOPHAGOGASTRODUODENOSCOPY (EGD);  Surgeon: Beryle Beams, MD;  Location: [facility];  Service: Endoscopy;  Laterality: N/A;  . Ercp N/A 12/24/2013    Procedure: UPPER ENDOSCOPY;  Surgeon: Beryle Beams, MD;  Location: [facility];  Service: Gastroenterology;  Laterality: N/A;  . Colonoscopy N/A 12/24/2013    Procedure: COLONOSCOPY;  Surgeon: Beryle Beams, MD;  Location: [facility];  Service: Gastroenterology;  Laterality: N/A;  . Insertion of dialysis catheter Right 02/20/2014    Procedure: Removal of catheter,  INSERTION OF DIALYSIS  CATHETER right IJ;  Surgeon: Conrad Stone, MD;  Location: [facility];  Service: Vascular;  Laterality: Right;    Family History  Problem Relation Age of Onset  . CAD Mother 38    Died at age 25  . CAD Father    Social History:  reports that he has never smoked. He has never used smokeless tobacco. He reports that he does not drink alcohol or use illicit drugs.  Allergies: No Known Allergies  Medications Prior to Admission  Medication Sig Dispense Refill  . Alogliptin-Pioglitazone (OSENI) 25-15 MG TABS Take 1 tablet by mouth daily.      [name] docusate sodium (COLACE) 100 MG capsule Take 100 mg by mouth 2 (two) times daily.      . Ferrous Sulfate (IRON) 325 (65 FE) MG TABS Take 325 mg by mouth 2 (two) times daily.  60 each  0  . Hydrocortisone-Aloe Vera (CORTIZONE-10 INTENSIVE HEALING) 1 % CREA Apply 1 application topically as needed (For itching).      [name] omeprazole (PRILOSEC) 40 MG capsule Take 1 capsule (40 mg total) by mouth 2 (two) times daily.  60 capsule  0  . polyethylene glycol (MIRALAX / GLYCOLAX) packet Take 17 g by mouth daily as needed for mild constipation.       . prochlorperazine (COMPAZINE) 10 MG tablet Take 1 tablet (10 mg total) by mouth every 6 (six) hours as needed for nausea or vomiting.  60 tablet  0  . [DISCONTINUED] oxyCODONE-acetaminophen (PERCOCET) 10-325 MG per tablet Take 1 tablet by  mouth every 6 (six) hours as needed for pain.  60 tablet  0    Results for orders placed during the hospital encounter of 03/09/2014 (from the past 48 hour(s))  GLUCOSE, CAPILLARY     Status: Abnormal   Collection Time    03/22/14 12:13 PM      Result Value Ref Range   Glucose-Capillary 122 (*) 70 - 99 mg/dL  HEPATITIS B SURFACE ANTIGEN     Status: None   Collection Time    03/22/14 12:35 PM      Result Value Ref Range   Hepatitis B Surface Ag NEGATIVE  NEGATIVE   Comment: Performed at [place], CAPILLARY     Status: Abnormal   Collection Time    03/22/14  5:18  PM      Result Value Ref Range   Glucose-Capillary 111 (*) 70 - 99 mg/dL  GLUCOSE, CAPILLARY     Status: Abnormal   Collection Time    03/22/14  9:15 PM      Result Value Ref Range   Glucose-Capillary 124 (*) 70 - 99 mg/dL  GLUCOSE, CAPILLARY     Status: Abnormal   Collection Time    03/23/14  7:48 AM      Result Value Ref Range   Glucose-Capillary 128 (*) 70 - 99 mg/dL   Comment 1 Notify RN     Comment 2 Documented in Chart    GLUCOSE, CAPILLARY     Status: Abnormal   Collection Time    03/23/14 12:03 PM      Result Value Ref Range   Glucose-Capillary 133 (*) 70 - 99 mg/dL   Comment 1 Notify RN     Comment 2 Documented in Chart    RENAL FUNCTION PANEL     Status: Abnormal   Collection Time    03/23/14 12:46 PM      Result Value Ref Range   Sodium 131 (*) 137 - 147 mEq/L   Potassium 5.0  3.7 - 5.3 mEq/L   Comment: HEMOLYSIS AT THIS LEVEL MAY AFFECT RESULT   Chloride 91 (*) 96 - 112 mEq/L   CO2 19  19 - 32 mEq/L   Glucose, Bld 132 (*) 70 - 99 mg/dL   BUN 39 (*) 6 - 23 mg/dL   Creatinine, Ser 3.99 (*) 0.50 - 1.35 mg/dL   Calcium 7.8 (*) 8.4 - 10.5 mg/dL   Phosphorus 5.2 (*) 2.3 - 4.6 mg/dL   Albumin 1.4 (*) 3.5 - 5.2 g/dL   GFR calc non Af Amer 15 (*) >90 mL/min   GFR calc Af Amer 18 (*) >90 mL/min   Comment: (NOTE)     The eGFR has been calculated using the CKD EPI equation.     This calculation has not been validated in all clinical situations.     eGFR's persistently <90 mL/min signify possible Chronic Kidney     Disease.  CBC     Status: Abnormal   Collection Time    03/23/14  3:11 PM      Result Value Ref Range   WBC 12.1 (*) 4.0 - 10.5 K/uL   RBC 3.20 (*) 4.22 - 5.81 MIL/uL   Hemoglobin 9.2 (*) 13.0 - 17.0 g/dL   Comment: REPEATED TO VERIFY   HCT 27.7 (*) 39.0 - 52.0 %   MCV 86.6  78.0 - 100.0 fL   MCH 28.8  26.0 - 34.0 pg   MCHC 33.2  30.0 - 36.0 g/dL  RDW 19.3 (*) 11.5 - 15.5 %   Platelets 98 (*) 150 - 400 K/uL   Comment: CONSISTENT WITH PREVIOUS  RESULT  GLUCOSE, CAPILLARY     Status: Abnormal   Collection Time    03/23/14  5:40 PM      Result Value Ref Range   Glucose-Capillary 134 (*) 70 - 99 mg/dL   Comment 1 Notify RN     Comment 2 Documented in Chart    GLUCOSE, CAPILLARY     Status: Abnormal   Collection Time    03/23/14  9:40 PM      Result Value Ref Range   Glucose-Capillary 120 (*) 70 - 99 mg/dL  RENAL FUNCTION PANEL     Status: Abnormal   Collection Time    03/24/14  7:13 AM      Result Value Ref Range   Sodium 135 (*) 137 - 147 mEq/L   Potassium 4.3  3.7 - 5.3 mEq/L   Chloride 91 (*) 96 - 112 mEq/L   CO2 21  19 - 32 mEq/L   Glucose, Bld 150 (*) 70 - 99 mg/dL   BUN 45 (*) 6 - 23 mg/dL   Creatinine, Ser 4.81 (*) 0.50 - 1.35 mg/dL   Calcium 8.2 (*) 8.4 - 10.5 mg/dL   Phosphorus 6.0 (*) 2.3 - 4.6 mg/dL   Albumin 1.5 (*) 3.5 - 5.2 g/dL   GFR calc non Af Amer 12 (*) >90 mL/min   GFR calc Af Amer 14 (*) >90 mL/min   Comment: (NOTE)     The eGFR has been calculated using the CKD EPI equation.     This calculation has not been validated in all clinical situations.     eGFR's persistently <90 mL/min signify possible Chronic Kidney     Disease.  CBC     Status: Abnormal   Collection Time    03/24/14  7:13 AM      Result Value Ref Range   WBC 11.3 (*) 4.0 - 10.5 K/uL   Comment: ADJUSTED FOR NUCLEATED RBC'S   RBC 3.24 (*) 4.22 - 5.81 MIL/uL   Hemoglobin 9.3 (*) 13.0 - 17.0 g/dL   Comment: CONSISTENT WITH PREVIOUS RESULT   HCT 28.5 (*) 39.0 - 52.0 %   MCV 88.0  78.0 - 100.0 fL   MCH 28.7  26.0 - 34.0 pg   MCHC 32.6  30.0 - 36.0 g/dL   RDW 19.6 (*) 11.5 - 15.5 %   Platelets 84 (*) 150 - 400 K/uL   Comment: CONSISTENT WITH PREVIOUS RESULT  GLUCOSE, CAPILLARY     Status: Abnormal   Collection Time    03/24/14  8:00 AM      Result Value Ref Range   Glucose-Capillary 135 (*) 70 - 99 mg/dL  PROTIME-INR     Status: None   Collection Time    03/24/14  8:32 AM      Result Value Ref Range   Prothrombin Time 13.2   11.6 - 15.2 seconds   INR 1.02  0.00 - 1.49   US Pelvis Limited  03/23/2014   CLINICAL DATA:  Left groin palpable lump post cardiac catheterization. Evaluate for abscess.  EXAM: US PELVIS LIMITED  TECHNIQUE: Ultrasound examination of the pelvic soft tissues was performed in the area of clinical concern.  COMPARISON:  None.  FINDINGS: From imaging of the left groin in the region of patient concern is performed. There is skin thickening and diffuse subcutaneous bands of edema. Deep to  the subcutaneous bands of edema is a focal discrete complex fluid collection that measures 3.5 x 1.8 x 3.7 cm. No significant internal vascular flow is seen. There is some flow around the periphery of this collection.  IMPRESSION: 3.5 x 1.8 x 3.7 cm complex fluid collection within the subcutaneous tissues of the left groin. This could reflect an abscess or hematoma. There is overlying subcutaneous edema and skin thickening.   Electronically Signed   By: Curlene Dolphin M.D.   On: 03/23/2014 15:49    Review of Systems  Constitutional: Negative for fever.  Cardiovascular: Negative for chest pain.  Gastrointestinal: Negative for nausea, vomiting and abdominal pain.  Musculoskeletal:       L groin pain  Neurological: Positive for weakness.    Blood pressure 72/38, pulse 108, temperature 98.8 F (37.1 C), temperature source Oral, resp. rate 18, height 5' 10.08" (1.78 m), weight 229.7 kg (506 lb 6.4 oz), SpO2 92.00%. Physical Exam  Constitutional: He is oriented to person, place, and time.  Morbid obese; 506 lbs  Cardiovascular: Normal rate and regular rhythm.   No murmur heard. Respiratory: Effort normal. He has no wheezes.  GI: Soft.  Musculoskeletal: Normal range of motion.  Lt groin: L large area of approx 7cm x 5 cm swelling Bandaged and bleeding. Odorous NT to touch but painful at site due to increasing size  Neurological: He is alert and oriented to person, place, and time.  Skin: Skin is warm.  Psychiatric:  He has a normal mood and affect. His behavior is normal. Judgment and thought content normal.     Assessment/Plan L groin LAN open wound after continued infection post OP Bx 1 yr ago Worsening swelling and bleeding US reveals collection of abscess vs hematoma Active bleeding Scheduled for aspiration of same Pt aware of procedure benefits and risks and agreeable to proceed Consent signed and in chart  Also request for tunneled central line Need for long term access and difficult stick Pt aware of procedure benefits and risks and agreeable to proceed Consent signed and in chart  Lavonia Drafts 03/24/2014, 10:04 AM

## 2014-03-24 NOTE — Progress Notes (Signed)
Patient ID: Douglas [name], male   DOB: 05/01/1956, 58 y.o.   MRN: [id number] 16 Days Post-Op  Subjective: We were asked to see patient again regarding a fluid collection on his left groin area on ultrasound.  He has a persistent lymphatic leak from a core needle biopsy site with a persistent wound.  Objective: Vital signs in last 24 hours: Temp:  [98.7 F (37.1 C)-98.9 F (37.2 C)] 98.9 F (37.2 C) (05/06 0910) Pulse Rate:  [100-111] 111 (05/06 0910) Resp:  [18-21] 18 (05/06 0530) BP: (70-85)/(34-41) 72/38 mmHg (05/06 0910) SpO2:  [92 %-93 %] 92 % (05/06 0530) Last BM Date: 03/24/14  Intake/Output from previous day:   Intake/Output this shift:    PE: Skin: wound is relatively clean, but he has brown foul smelling drainage, like necrotic lymph tissue and fluid.  No evidence of abscess or erythema.  Diffuse lymphedema.   Also upon my arrival he was noted to have extensive bleeding in his groin as well.  When his ABD was removed, he had an obvious venous bleed noted in a smaller pressure wound he has just medial to his larger wound.  A 2-0 nylon suture was used to place a figure of eight stitch around this vein to ligate it.  One stitch resolved all bleeding.  A dressing was replaced over each wound.  Lab Results:   Recent Labs  03/23/14 1511 03/24/14 0713  WBC 12.1* 11.3*  HGB 9.2* 9.3*  HCT 27.7* 28.5*  PLT 98* 84*   BMET  Recent Labs  03/23/14 1246 03/24/14 0713  NA 131* 135*  K 5.0 4.3  CL 91* 91*  CO2 19 21  GLUCOSE 132* 150*  BUN 39* 45*  CREATININE 3.99* 4.81*  CALCIUM 7.8* 8.2*   PT/INR  Recent Labs  03/24/14 0832  LABPROT 13.2  INR 1.02   CMP     Component Value Date/Time   NA 135* 03/24/2014 0713   NA 137 01/19/2014 1101   K 4.3 03/24/2014 0713   K 3.7 01/19/2014 1101   CL 91* 03/24/2014 0713   CO2 21 03/24/2014 0713   CO2 27 01/19/2014 1101   GLUCOSE 150* 03/24/2014 0713   GLUCOSE 204* 01/19/2014 1101   BUN 45* 03/24/2014 0713   BUN 15.8 01/19/2014 1101    CREATININE 4.81* 03/24/2014 0713   CREATININE 0.9 01/19/2014 1101   CALCIUM 8.2* 03/24/2014 0713   CALCIUM 9.8 01/19/2014 1101   PROT 6.8 02/24/2014 0545   PROT 7.3 01/19/2014 1101   ALBUMIN 1.5* 03/24/2014 0713   ALBUMIN 2.0* 01/19/2014 1101   AST 33 02/24/2014 0545   AST 23 01/19/2014 1101   ALT 11 02/24/2014 0545   ALT 15 01/19/2014 1101   ALKPHOS 85 02/24/2014 0545   ALKPHOS 186* 01/19/2014 1101   BILITOT 0.3 02/24/2014 0545   BILITOT 0.44 01/19/2014 1101   GFRNONAA 12* 03/24/2014 0713   GFRAA 14* 03/24/2014 0713   Lipase  No results found for this basename: lipase       Studies/Results: US Pelvis Limited  03/23/2014   CLINICAL DATA:  Left groin palpable lump post cardiac catheterization. Evaluate for abscess.  EXAM: US PELVIS LIMITED  TECHNIQUE: Ultrasound examination of the pelvic soft tissues was performed in the area of clinical concern.  COMPARISON:  None.  FINDINGS: From imaging of the left groin in the region of patient concern is performed. There is skin thickening and diffuse subcutaneous bands of edema. Deep to the subcutaneous bands of edema is a  focal discrete complex fluid collection that measures 3.5 x 1.8 x 3.7 cm. No significant internal vascular flow is seen. There is some flow around the periphery of this collection.  IMPRESSION: 3.5 x 1.8 x 3.7 cm complex fluid collection within the subcutaneous tissues of the left groin. This could reflect an abscess or hematoma. There is overlying subcutaneous edema and skin thickening.   Electronically Signed   By: Curlene Dolphin M.D.   On: 03/23/2014 15:49    Anti-infectives: Anti-infectives   Start     Dose/Rate Route Frequency Ordered Stop   02/25/2014 0953  ceFAZolin (ANCEF) 1-5 GM-% IVPB    Comments:  [name]   : cabinet override      03/12/2014 9381 03/01/2014 2159   03/02/2014 0953  ceFAZolin (ANCEF) 2-3 GM-% IVPB SOLR    Comments:  [name]   : cabinet override      02/26/2014 0953 03/09/2014 2159   03/14/2014 0600  [MAR Hold]  ceFAZolin (ANCEF) 3  g in dextrose 5 % 50 mL IVPB     (On MAR Hold since 03/07/2014 0850)   3 g 160 mL/hr over 30 Minutes Intravenous On call 03/07/14 1556 03/08/2014 1021   03/10/2014 0000  ceFAZolin (ANCEF) IVPB 1 g/50 mL premix  Status:  Discontinued    Comments:  Send with pt to OR   1 g 100 mL/hr over 30 Minutes Intravenous On call 03/07/14 1540 03/07/14 1555   03/01/14 1800  doxycycline (VIBRA-TABS) tablet 100 mg  Status:  Discontinued     100 mg Oral Every 12 hours 03/01/14 1522 03/10/14 1119   03/01/14 0800  ciprofloxacin (CIPRO) tablet 500 mg  Status:  Discontinued     500 mg Oral Daily with breakfast 02/28/14 1106 03/10/14 1119   02/26/14 0000  doxycycline (VIBRAMYCIN) 100 MG capsule     100 mg Oral 2 times daily 02/26/14 1153     02/26/14 0000  ciprofloxacin (CIPRO) 500 MG tablet     500 mg Oral 2 times daily 02/26/14 1153     02/25/14 1100  ciprofloxacin (CIPRO) IVPB 400 mg  Status:  Discontinued     400 mg 200 mL/hr over 60 Minutes Intravenous Every 12 hours 02/25/14 1047 02/28/14 1106   02/24/14 1830  doxycycline (VIBRAMYCIN) 100 mg in dextrose 5 % 250 mL IVPB  Status:  Discontinued     100 mg 125 mL/hr over 120 Minutes Intravenous Every 12 hours 02/24/14 1756 03/01/14 1521   02/24/14 1800  cefTRIAXone (ROCEPHIN) 1 g in dextrose 5 % 50 mL IVPB  Status:  Discontinued     1 g 100 mL/hr over 30 Minutes Intravenous Every 24 hours 02/24/14 1231 02/24/14 1756   02/24/14 1200  vancomycin (VANCOCIN) 1,500 mg in sodium chloride 0.9 % 250 mL IVPB  Status:  Discontinued     1,500 mg 250 mL/hr over 60 Minutes Intravenous  Once 02/23/14 1423 02/24/14 1231   02/20/14 2200  vancomycin (VANCOCIN) 1,500 mg in sodium chloride 0.9 % 500 mL IVPB  Status:  Discontinued     1,500 mg 250 mL/hr over 120 Minutes Intravenous Every 24 hours 2014-03-05 2119 02/22/14 1116   05-Mar-2014 2200  vancomycin (VANCOCIN) 2,000 mg in sodium chloride 0.9 % 500 mL IVPB     2,000 mg 250 mL/hr over 120 Minutes Intravenous  Once 03-05-14 2119  02/20/14 0005       Assessment/Plan  1. S/p core needle LN biopsy with persistent lymphatic leak  2. Left inguinal  pressure wound with venous bleeding  Plan: 1. His larger wound is stable.  The fluid collection seen on ultrasound may represent more likely lymphatic fluid collection.  There are no indications to drain this as it will create a larger wound with persistent leaking.  He is not febrile and there is no exterior evidence that this is an abscess.  VAC can be replaced back to wound and help with drainage and healing. 2. After suture ligation was performed, bleeding stopped to his other wound.  No further care needed for that.   3. No surgical intervention needed.  Will check suture ligation tomorrow, but will defer further wound care to [facility], RN.   LOS: 33 days    Henreitta Cea 03/24/2014, 12:50 PM Pager: [phone]  Agree with above.  Alphonsa Overall, MD, [facility] Surgery Pager: [phone] Office phone:  [phone]

## 2014-03-24 NOTE — Progress Notes (Signed)
Left groin dressing was changed by critical care MD this morning. Still needs PM dressing for BID changes.  Unable to measure/document other wound assessments at this time. Will relay to night shift RN that these tasks need to be completed.  Locke Barrell Donnie Aho, RN

## 2014-03-24 NOTE — Progress Notes (Signed)
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   SIGNIFICANT EVENTS / STUDIES:  4/3 ARF requiring emergent CRRT.4/4, Renal US negative.  4/4 ARF thought to be due to ATN from severe sepsis, per oncology not tumor lysis syndrome. No DVT, heparin gtt stopped.  4/5 Continue CRRT, to transition to HD, no renal recovery yet. Hyperuricemia resolved. Dialysis access is a right IJ dialysis catheter  4/6 .Wound vac placed  4/7 -hemodialysis initiated  ------------ 5/6 hypotension  LINES / TUBES: HD RIJ catheter 4/3 >4/20 HD RIJ permacath 4/20 >>>  CULTURES: MRSA PCR 4/3 Postive Wound 4/3> neg BC 4/5>>neg Wound 4/28 >>> Abundant MRSA and Acinetobacter- MDR - both sensitive to Norva Karvonen  ANTIBIOTICS:   VITAL SIGNS: Temp:  [97.3 F (36.3 C)-98.8 F (37.1 C)] 98.8 F (37.1 C) (05/06 0530) Pulse Rate:  [100-108] 108 (05/06 0530) Resp:  [18-21] 18 (05/06 0530) BP: (70-85)/(34-41) 72/38 mmHg (05/06 0910) SpO2:  [92 %-93 %] 92 % (05/06 0530) HEMODYNAMICS:   VENTILATOR SETTINGS:   INTAKE / OUTPUT: Intake/Output     05/05 0701 - 05/06 0700 05/06 0701 - 05/07 0700   P.O.     Blood     Total Intake(mL/kg)     Other     Stool     Total Output       Net              PHYSICAL EXAMINATION: General: NAD, morbidly obese Neuro: Moving extremities, normal strength, alert and oriented HEENT:  Normocephalic  Cardiovascular:  Normal rate and rhythm, distant  heart sounds Lungs: clear to ausculation anteriorly  Abdomen: lower abdominal tenderness, normal BS, no guarding Musculoskeletal:  BLE edema Skin:  Wound to L groin, R groin bleeding  LABS:  CBC  Recent Labs Lab 03/22/14 0717 03/23/14 1511 03/24/14 0713  HGB 7.2* 9.2* 9.3*  HCT 22.7* 27.7* 28.5*  WBC 13.2* 12.1* 11.3*  PLT 114* 98* 84*    COAGULATION  Recent Labs Lab 03/24/14 0832  INR 1.02    CARDIAC   No results found for this basename: TROPONINI,  in the last 168 hours No results found for this basename: PROBNP,  in the last 168 hours   CHEMISTRY  Recent Labs Lab 03/20/14 0635 03/21/14 1200 03/22/14 0617 03/23/14 1246 03/24/14 0713  NA 135* 132* 131* 131* 135*  K 3.9 4.2 4.9 5.0 4.3  CL 89* 85* 87* 91* 91*  CO2 22 19 19 19 21   GLUCOSE 120* 140* 128* 132* 150*  BUN 35* 44* 53* 39* 45*  CREATININE 3.86* 4.76* 5.24* 3.99* 4.81*  CALCIUM 8.4 8.8 8.9 7.8* 8.2*  PHOS 4.8* 6.0* 6.8* 5.2* 6.0*   Estimated Creatinine Clearance: 32.9 ml/min (by C-G formula based on Cr of 4.81).   LIVER  Recent Labs Lab 03/20/14 0635 03/21/14 1200 03/22/14 0617 03/23/14 1246 03/24/14 0713 03/24/14 0832  ALBUMIN 1.5* 1.7* 1.5* 1.4* 1.5*  --   INR  --   --   --   --   --  1.02     INFECTIOUS No results found for this basename: LATICACIDVEN, PROCALCITON,  in the last 168 hours   ENDOCRINE CBG (last 3)   Recent Labs  03/23/14 1740 03/23/14 2140 03/24/14 0800  GLUCAP 134* 120* 135*      ASSESSMENT / PLAN:  A: Hypotension likely secondary to ESRD/HD and leg wound as primary team has indicated. Possibly c/b venous bleed R groin but hgb stable. Does not meet SIRS criteria so sepsis is unlikely etiology. His mental status appears to be at baseline, and accounting for ESRD, is does not appear that there is any end-organ malperfusion. Suspect even manual BP is not completely accurate here given vascular abnormalities and body habitus.  ESRD L Groin Wound R  groin venous bleed  DM 2  Recs: -Monitor mental status closely for decline, if declines then BP may truly be low  -continue telemetry -Assess BP manually  -surgery to see for R groin bleed, IR following for ?drain L groin abscess/fluid collection  -consider gentle volume given bleeding  -close f/u cbc - hgb currently stable  -Other management per primary team/renal/surgery -Recommend discussing goals of care as patient's functional status is very poor, prolonged hospitalization and continues to develop complications   PCCM signing off, please call back if needed.   Marijean Heath, NP 03/24/2014  10:11 AM Pager: [phone] or [phone]  *Care during the described time interval was provided by me and/or other providers on the critical care team. I have reviewed this patient's available data, including medical history, events of note, physical examination and test results as part of my evaluation.  Rush Farmer, M.D. [facility] Pulmonary/Critical Care Medicine. Pager: [phone]. After hours pager: [phone].

## 2014-03-24 NOTE — Procedures (Signed)
1. LEFT IJ TL CVC placement under [place] 2. LEFT inguinal collection aspiration under [place] (attempted), no fluid returned No complication No blood loss. See complete dictation in [facility].

## 2014-03-24 NOTE — Progress Notes (Signed)
Spoke with Dr. Florene Glen about patient's very poor vascular access. He stated it would be okay to place a central line today. Will discuss with Dr. Ree Kida for orders.  Kyen Taite Donnie Aho

## 2014-03-24 NOTE — Progress Notes (Signed)
Albumin dose infusing at 60 ml/hr. Verified this dose with pharmacist.  Joellen Jersey

## 2014-03-24 NOTE — Progress Notes (Signed)
Late Entry  Pt needed to be cleaned after having a BM this morning at 0850. While giving pt a bath, a wound deep in his left groin started to bleed profusely. CN and rapid response RN notified. SBP 70's, which has been pt's baseline recently. Pt was asymptomatic. Pressure was applied to left groin wound for approx 10 minutes and bleeding had significantly decreased. Gauze was packed into wound until it is able to be evaluated by critical care doctors. Dr. Ree Kida was also at bedside at this time. Pt scheduled to have wound drained today. Will continue to monitor.  Rawad Bochicchio Donnie Aho, RN

## 2014-03-24 NOTE — Progress Notes (Signed)
PT Cancellation Note  Patient Details Name: Douglas [name] MRN: [id number] DOB: [birth year]   Cancelled Treatment:    Reason Eval/Treat Not Completed: Medical issues which prohibited therapy  Discussed pt medical status with [name], RN  BP continues to be low  Pt to go to IR for procedure, and it is an HD day  Currently , MD and  Rapid [employer], RN in to assist  Nursing had made the very sound clinical decision to hold on using lift to help pt OOB this am.   PT will continue to follow for exercise and mobility as appropriate.  Thank you,  [name], PT  Acute Rehabilitation Services Pager [phone] Office [phone]    [name] 03/24/2014, 9:09 AM

## 2014-03-24 NOTE — Consult Note (Signed)
WOC noted findings on US performed yesterday after bedside nursing noted yet additional changes in the output from the left groin wound. Continues to tunnel 9.5cm, this tunneling has not improved over the course of treatment.  Will hold off on VAC placement until further workup for this area is complete. Notified bedside nursing staff.   Alandis Bluemel [place] RN,CWOCN [phone]

## 2014-03-24 NOTE — Progress Notes (Signed)
Pt still does not want to go on CPAP.  Pt stated "lets just not wear it tonight"  RT made patient aware that if he changed his mind to let RN know and RT could come back and place patient on CPAP.

## 2014-03-24 NOTE — Progress Notes (Signed)
Patient's BP 70/34 HR 100, patient sleepy, denies dizziness.  Tylene Fantasia, NP notified.  Will recheck BP and call if SBP is less than 70.  BP rechecked 85/41 HR 102

## 2014-03-25 DIAGNOSIS — R229 Localized swelling, mass and lump, unspecified: Secondary | ICD-10-CM

## 2014-03-25 LAB — RENAL FUNCTION PANEL
Albumin: 1.7 g/dL — ABNORMAL LOW (ref 3.5–5.2)
BUN: 50 mg/dL — ABNORMAL HIGH (ref 6–23)
CALCIUM: 7.7 mg/dL — AB (ref 8.4–10.5)
CHLORIDE: 92 meq/L — AB (ref 96–112)
CO2: 21 meq/L (ref 19–32)
Creatinine, Ser: 5.39 mg/dL — ABNORMAL HIGH (ref 0.50–1.35)
GFR calc Af Amer: 12 mL/min — ABNORMAL LOW (ref >90)
GFR, EST NON AFRICAN AMERICAN: 11 mL/min — AB (ref >90)
Glucose, Bld: 149 mg/dL — ABNORMAL HIGH (ref 70–99)
POTASSIUM: 4.4 meq/L (ref 3.7–5.3)
Phosphorus: 7 mg/dL — ABNORMAL HIGH (ref 2.3–4.6)
Sodium: 134 mEq/L — ABNORMAL LOW (ref 137–147)

## 2014-03-25 LAB — GLUCOSE, CAPILLARY
GLUCOSE-CAPILLARY: 155 mg/dL — AB (ref 70–99)
GLUCOSE-CAPILLARY: 150 mg/dL — AB (ref 70–99)
GLUCOSE-CAPILLARY: 124 mg/dL — AB (ref 70–99)
Glucose-Capillary: 154 mg/dL — ABNORMAL HIGH (ref 70–99)

## 2014-03-25 NOTE — Consult Note (Signed)
Noted events over last 24 hours with bleeding and requirement of sutures to control bleeding. This [place] nurse does not feel comfortable placing a VAC on this wound as it is contraindicated in the presence of active bleeding or risk of bleeding.  Would recommend continued saline packing BID and PRN increased output.  Charisma Charlot [place] RN,CWOCN [phone]

## 2014-03-25 NOTE — Progress Notes (Signed)
Family member at bedside states pt will not use CPAP tonight but that he normally uses it fine himself. RT available if needed.

## 2014-03-25 NOTE — Progress Notes (Signed)
[facility]                                                                              Patient Demographics  Douglas [name], is a 58 y.o. male, DOB - 1956-06-24, IRJ:[id number]  Admit date - February 24, 2014   Admitting Physician Chesley Mires, MD  Outpatient Primary MD for the patient is [name], MD  LOS - 75   Chief Complaint  Patient presents with  . Shortness of Breath     Brief summary Patient is a 58 year old male with history of follicular low-grade non-Hodgkin lymphoma, large left groin lymphadenopathy, diagnosed in 2000 and was on recent chemotherapy, had admission for acute renal failure 3/17- 3/20. He was suspected to have acute renal failure de to HCTZ and ARB.  He had outpatient left-sided inguinal node biopsy after which he has developed a large lymph filled seroma on the left groin requiring wound VAC placement and continuous drainage. Have discussed his case multiple times with general surgery who do not recommend any further surgical intervention. Once he has a rehabilitation bed he could be placed in a rehabilitation as he requires intermittent IV fluid boluses for ongoing fluid loss due to daily lymph drainage from the wound .   Assessment & Plan   Acute kidney injury potentially related to tumor lysis, now end-stage renal disease -Nephrology consulted and appreciated -currently has Right IJ dialysis catheter   Hodgkin's lymphoma with follicular low-grade, large left groin lymphadenopathy status post chemotherapy/hyperuricemia -s/p Bendamustine and rituximab 02/03/14  -Hyperuricemia resolved, oncology following (Dr. Jana Hakim)- recommendations: stable G6PD, LDH levels are around 600 which is twice normal, transfuse PRBCs as needed. CT abdomen for restaging outpatient home along with outpatient followup with oncology post discharge.   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 -Consulted IR for possible drainage, however, no fluid returned with aspiration -Surgery wanted wound vac to be placed, however, Wound care nurse stated this was a contraindication due to active bleeding and/or risk of bleeding  Intermittent tachycardia and hypotension -Likely due to removal of fluid during hemodialysis versus ongoing loss from left inguinal -Spoke with Dr. Florene Glen, nephrologist, will try to use albumin to expand volume, will hold off of dialysis for now, continue IV fluid and albumin -Continue midodrine -PCCM consulted and does not recommend pressors at this time due to patient being asymptomatic  Type 2 diabetes mellitus  -Continue insulin sliding scale with CBG monitoring  GERD -Continue PPI  Constipation -Continue bowel regimen  Code Status: Full  Family Communication: None at bedside  Disposition Plan: Admitted, possible LTAC placement  Time Spent in minutes   25 minutes  Procedures/Significant Events 4/3 Hyperkalemia, hyperuricemia, hyperphosphatemia, with ARF requiring emergent CRRT. UA with urate crystals. Renal US normal. No pulmonary edema on  CXR.  4/4 ARF thought to be due to ATN from severe sepsis, per oncology not tumor lysis syndrome. No DVT, heparin gtt stopped. 2D-echo normal EF  4/5 Continue CRRT, to transition to HD, no renal recovery yet. Hyperuricemia resolved. Dialysis access is a right IJ dialysis catheter  4/6 .Wound vac placed  4/7 -hemodialysis initiated  5/6 IR to drain left inguinal  fluid collection, attempted  5/6 IR placed Left IJ TL CVC under [place]  Consults   Nephrology Interventional radiology PCCM Oncology  DVT Prophylaxis   SCDs  Lab Results  Component Value Date   PLT 84* 03/24/2014    Medications  Scheduled Meds: . albumin human  12.5 g Intravenous Q6H  . bisacodyl  10 mg Rectal Daily  . docusate sodium  200 mg Oral BID  . feeding supplement (NEPRO CARB STEADY)  237 mL Oral BID BM  . heparin subcutaneous  5,000 Units Subcutaneous 3 times per day  . insulin aspart  0-9 Units Subcutaneous TID WC  . lactulose  30 g Oral BID  . midodrine  10 mg Oral TID WC  .  morphine injection  1 mg Intravenous Once  . multivitamin  1 tablet Oral QHS  . pantoprazole  40 mg Oral Q1200  . polyethylene glycol  17 g Oral Daily  . senna  2 tablet Oral Daily  . sodium bicarbonate  1,300 mg Oral TID  . sodium chloride  3 mL Intravenous Q12H   Continuous Infusions: . sodium chloride Stopped (03/15/14 0700)   PRN Meds:.diphenhydrAMINE, guaiFENesin-dextromethorphan, heparin, hydrOXYzine, oxyCODONE-acetaminophen, simethicone, sodium chloride, sodium chloride  Antibiotics    Anti-infectives   Start     Dose/Rate Route Frequency Ordered Stop   02/24/2014 0953  ceFAZolin (ANCEF) 1-5 GM-% IVPB    Comments:  [name]   : cabinet override      03/02/2014 [id number] 02/24/2014 2159   03/13/2014 0953  ceFAZolin (ANCEF) 2-3 GM-% IVPB SOLR    Comments:  [name]   : cabinet override      02/19/2014 0953 03/13/2014 2159   03/16/2014 0600  [MAR Hold]  ceFAZolin (ANCEF) 3 g in dextrose 5 % 50 mL IVPB     (On MAR Hold since 03/18/2014 0850)   3 g 160 mL/hr over 30 Minutes Intravenous On call 03/07/14 1556 03/06/2014 1021   02/22/2014 0000  ceFAZolin (ANCEF) IVPB 1 g/50 mL premix  Status:  Discontinued    Comments:  Send with pt to OR   1 g 100 mL/hr over 30 Minutes Intravenous On call 03/07/14 1540 03/07/14 1555   03/01/14 1800  doxycycline (VIBRA-TABS) tablet 100 mg  Status:  Discontinued       100 mg Oral Every 12 hours 03/01/14 1522 03/10/14 1119   03/01/14 0800  ciprofloxacin (CIPRO) tablet 500 mg  Status:  Discontinued     500 mg Oral Daily with breakfast 02/28/14 1106 03/10/14 1119   02/26/14 0000  doxycycline (VIBRAMYCIN) 100 MG capsule     100 mg Oral 2 times daily 02/26/14 1153     02/26/14 0000  ciprofloxacin (CIPRO) 500 MG tablet     500 mg Oral 2 times daily 02/26/14 1153     02/25/14 1100  ciprofloxacin (CIPRO) IVPB 400 mg  Status:  Discontinued     400 mg 200 mL/hr over 60 Minutes Intravenous Every 12 hours 02/25/14 1047 02/28/14 1106   02/24/14 1830  doxycycline (VIBRAMYCIN) 100 mg in dextrose 5 % 250 mL IVPB  Status:  Discontinued     100 mg 125 mL/hr over 120 Minutes Intravenous Every 12 hours 02/24/14 1756 03/01/14 1521   02/24/14 1800  cefTRIAXone (ROCEPHIN) 1 g in dextrose 5 % 50 mL IVPB  Status:  Discontinued     1 g 100 mL/hr over 30 Minutes Intravenous Every 24 hours 02/24/14 1231 02/24/14 1756   02/24/14 1200  vancomycin (VANCOCIN) 1,500 mg in sodium chloride 0.9 % 250 mL IVPB  Status:  Discontinued     1,500 mg 250 mL/hr over 60 Minutes Intravenous  Once 02/23/14 1423 02/24/14 1231   02/20/14 2200  vancomycin (VANCOCIN) 1,500 mg in sodium chloride 0.9 % 500 mL IVPB  Status:  Discontinued     1,500 mg 250 mL/hr over 120 Minutes Intravenous Every 24 hours 03/12/2014 2119 02/22/14 1116   03/11/2014 2200  vancomycin (VANCOCIN) 2,000 mg in sodium chloride 0.9 % 500 mL IVPB     2,000 mg 250 mL/hr over 120 Minutes Intravenous  Once 03/18/2014 2119 02/20/14 0005        Subjective:   Costella Hatcher seen and examined today.  Patient states he still has pain, however, improved since yesterday.  Denies dizziness, chest pain, shortness of breath.   Objective:   Filed Vitals:   03/24/14 1300 03/24/14 1748 03/24/14 2100 03/25/14 0558  BP: 79/47 75/36 90/58  94/48  Pulse: 101 95 97 98  Temp: 97.1 F (36.2 C) 98.3 F (36.8 C) 98 F (36.7 C) 98 F (36.7 C)   TempSrc: Oral Oral Oral Oral  Resp: 16 15 20 18   Height:      Weight:      SpO2: 93% 98% 100% 100%    Wt Readings from Last 3 Encounters:  03/22/14 229.7 kg (506 lb 6.4 oz)  03/22/14 229.7 kg (506 lb 6.4 oz)  02/05/14 183.435 kg (404 lb 6.4 oz)     Intake/Output Summary (Last 24 hours) at 03/25/14 0867 Last data filed at 03/24/14 1500  Gross per 24 hour  Intake      0 ml  Output      0 ml  Net      0 ml    Exam  General: Well developed, morbidly obese, NAD   HEENT: NCAT, mucous membranes moist.  Neck: Supple, no JVD, no masses  Cardiovascular: S1 S2 auscultated, distant heart sounds  Respiratory: Clear to auscultation bilaterally with equal chest rise  Abdomen: Soft, nontender, nondistended, + bowel sounds  Extremities: warm dry without cyanosis clubbing, bilateral lower extremity edema  Neuro: AAOx3, No focal deficits. Able to move all 4 extremities  Skin: Packing noted in left inguinal area  Psych: Normal affect and demeanor with intact judgement and insight  Data Review   Micro Results Recent Results (from the past 240 hour(s))  WOUND CULTURE     Status: None   Collection Time    03/16/14 10:12 AM      Result Value Ref Range Status   Specimen Description WOUND LEG LEFT   Final   Special Requests NONE   Final   Gram Stain     Final   Value: RARE WBC PRESENT, PREDOMINANTLY PMN     NO SQUAMOUS EPITHELIAL CELLS SEEN     ABUNDANT GRAM NEGATIVE RODS     MODERATE GRAM POSITIVE COCCI     IN PAIRS   Culture     Final   Value: ABUNDANT ACINETOBACTER CALCOACETICUS/BAUMANNII COMPLEX     Note: COLISTIN SENSITIVE 0.38ug/mL TIGECYCLINE 2 ML/ug  ABUNDANT METHICILLIN RESISTANT STAPHYLOCOCCUS AUREUS     Note: RIFAMPIN AND GENTAMICIN SHOULD NOT BE USED AS SINGLE DRUGS FOR TREATMENT OF STAPH INFECTIONS. CRITICAL RESULT CALLED TO, READ BACK BY AND VERIFIED WITH: ASHLEY LARSON @ 9:58AM 03/21/14 BY DWEEKS     Performed at [facility]   Report Status  03/22/2014 FINAL   Final   Organism ID, Bacteria ACINETOBACTER CALCOACETICUS/BAUMANNII COMPLEX   Final   Organism ID, Bacteria METHICILLIN RESISTANT STAPHYLOCOCCUS AUREUS   Final    Radiology Reports US Pelvis Limited  03/23/2014   CLINICAL DATA:  Left groin palpable lump post cardiac catheterization. Evaluate for abscess.  EXAM: US PELVIS LIMITED  TECHNIQUE: Ultrasound examination of the pelvic soft tissues was performed in the area of clinical concern.  COMPARISON:  None.  FINDINGS: From imaging of the left groin in the region of patient concern is performed. There is skin thickening and diffuse subcutaneous bands of edema. Deep to the subcutaneous bands of edema is a focal discrete complex fluid collection that measures 3.5 x 1.8 x 3.7 cm. No significant internal vascular flow is seen. There is some flow around the periphery of this collection.  IMPRESSION: 3.5 x 1.8 x 3.7 cm complex fluid collection within the subcutaneous tissues of the left groin. This could reflect an abscess or hematoma. There is overlying subcutaneous edema and skin thickening.   Electronically Signed   By: Curlene Dolphin M.D.   On: 03/23/2014 15:49   Dg Chest Port 1 View  03/21/2014   CLINICAL DATA:  Breath  EXAM: PORTABLE CHEST - 1 VIEW  COMPARISON:  DG CHEST 1V PORT dated 03/25/14  FINDINGS: There is a right-sided dual-lumen central venous catheter in satisfactory position.  There is bilateral diffuse interstitial thickening. There is no pleural effusion or pneumothorax. There is a persistent right lower lobe pulmonary nodule. There is no other focal parenchymal opacity. There is enlargement of the central pulmonary vasculature. He cardiomediastinal silhouette is stable. The osseous structures are unremarkable.  IMPRESSION: 1. Findings of concerning for mild interstitial edema. 2. Stable right lower lobe pulmonary nodule.   Electronically Signed   By: Kathreen Devoid   On: 03/21/2014 23:41   Dg Chest Port 1 View  2014-03-25    CLINICAL DATA:  Status post catheter placement  EXAM: PORTABLE CHEST - 1 VIEW  COMPARISON:  DG CHEST 1V PORT dated 02/24/2014; CT CHEST W/CM dated 12/02/2013; DG CHEST 1 VIEW dated 02/03/2014  FINDINGS: There is a dual-lumen right-sided central venous catheter in satisfactory position. There is a 2.8 cm right lower lobe pulmonary nodule. There is no other focal consolidation, pleural effusion or pneumothorax. Stable cardiac silhouette. Bilateral hilar enlargement  The osseous structures are unremarkable.  IMPRESSION: 1. Satisfactory position of a dialysis catheter. 2. Interval enlargement of a right lower lobe pulmonary nodule.   Electronically Signed   By: Kathreen Devoid   On: 03/25/2014 13:11   Dg Chest Port 1 View  02/25/2014   CLINICAL DATA:  Cough and chest pain  EXAM: PORTABLE CHEST - 1 VIEW  COMPARISON:  02/23/2014  FINDINGS: Unchanged positioning of right IJ catheter, tip at the lower SVC. Unchanged heart size and mediastinal contours.  No edema, effusion, consolidation, or pneumothorax. Right-sided pulmonary parenchymal nodularity is again noted, with a dominant 2 cm nodule in the lower right chest, likely representing a middle lobe nodule seen on previous chest CT 12/02/2013. These are stable from recent comparison examinations, but likely progressed from 12/02/2013.  IMPRESSION: 1.  No active disease. 2. Right lung nodularity which is stable compared to recent studies, but enlarged from 12/02/2013 chest CT comparison.   Electronically Signed   By: Jorje Guild M.D.   On: 02/25/2014 01:21   Dg Chest Port 1 View  02/23/2014   CLINICAL DATA:  Respiratory distress.  EXAM: PORTABLE CHEST - 1 VIEW  COMPARISON:  DG CHEST 1V PORT dated 02/20/2014; CT CHEST W/CM dated 12/02/2013; DG CHEST 1V PORT dated 03/01/2014; DG CHEST 1V PORT dated 02/23/2014  FINDINGS: Cardiomediastinal silhouette remains stable and normal. Dialysis catheter tips SVC RA junction, unchanged. No focal infiltrates, although right lung nodular  densities persist. Negative osseous structures.  IMPRESSION: No active infiltrates.   Electronically Signed   By: Rolla Flatten M.D.   On: 02/23/2014 08:10   Dg Fluoro Guide Cv Line-no Report  03/09/2014   CLINICAL DATA: insertion diatek in OR# [id number]   FLOURO GUIDE CV LINE  Fluoroscopy was utilized by the requesting physician.  No radiographic  interpretation.     CBC  Recent Labs Lab 03/19/14 0725 03/22/14 0717 03/23/14 1511 03/24/14 0713  WBC 12.2* 13.2* 12.1* 11.3*  HGB 8.6* 7.2* 9.2* 9.3*  HCT 27.1* 22.7* 27.7* 28.5*  PLT 144* 114* 98* 84*  MCV 88.9 89.0 86.6 88.0  MCH 28.2 28.2 28.8 28.7  MCHC 31.7 31.7 33.2 32.6  RDW 19.4* 20.1* 19.3* 19.6*  LYMPHSABS 0.7  --   --   --   MONOABS 2.1*  --   --   --   EOSABS 0.1  --   --   --   BASOSABS 0.0  --   --   --     Chemistries   Recent Labs Lab 03/21/14 1200 03/22/14 0617 03/23/14 1246 03/24/14 0713 03/25/14 0608  NA 132* 131* 131* 135* 134*  K 4.2 4.9 5.0 4.3 4.4  CL 85* 87* 91* 91* 92*  CO2 19 19 19 21 21   GLUCOSE 140* 128* 132* 150* 149*  BUN 44* 53* 39* 45* 50*  CREATININE 4.76* 5.24* 3.99* 4.81* 5.39*  CALCIUM 8.8 8.9 7.8* 8.2* 7.7*   ------------------------------------------------------------------------------------------------------------------ estimated creatinine clearance is 29.4 ml/min (by C-G formula based on Cr of 5.39). ------------------------------------------------------------------------------------------------------------------ No results found for this basename: HGBA1C,  in the last 72 hours ------------------------------------------------------------------------------------------------------------------ No results found for this basename: CHOL, HDL, LDLCALC, TRIG, CHOLHDL, LDLDIRECT,  in the last 72 hours ------------------------------------------------------------------------------------------------------------------ No results found for this basename: TSH, T4TOTAL, FREET3, T3FREE, THYROIDAB,  in  the last 72 hours ------------------------------------------------------------------------------------------------------------------ No results found for this basename: VITAMINB12, FOLATE, FERRITIN, TIBC, IRON, RETICCTPCT,  in the last 72 hours  Coagulation profile  Recent Labs Lab 03/24/14 0832  INR 1.02    No results found for this basename: DDIMER,  in the last 72 hours  Cardiac Enzymes No results found for this basename: CK, CKMB, TROPONINI, MYOGLOBIN,  in the last 168 hours ------------------------------------------------------------------------------------------------------------------ No components found with this basename: POCBNP,     Daryl Beehler D.O. on 03/25/2014 at 8:26 AM  Between 7am to 7pm - Pager - [phone]  After 7pm go to [email or web address] - password [email or web address]  And look for the night coverage person covering for me after hours  [facility] Office  [phone]

## 2014-03-25 NOTE — Progress Notes (Signed)
IV team notified to draw labs from central line placed yesterday.

## 2014-03-25 NOTE — Progress Notes (Addendum)
17 Days Post-Op  Subjective: No further bleeding Wound VAC not replaced secondary for worry about bleeding  Objective: Vital signs in last 24 hours: Temp:  [97.1 F (36.2 C)-98.9 F (37.2 C)] 97.7 F (36.5 C) (05/07 0832) Pulse Rate:  [95-111] 97 (05/07 0832) Resp:  [15-20] 16 (05/07 0832) BP: (72-94)/(36-59) 89/59 mmHg (05/07 0832) SpO2:  [90 %-100 %] 90 % (05/07 0832) Last BM Date: 03/24/14  Intake/Output from previous day:   Intake/Output this shift:    Left groin wound stable, no bleeding Large amount of edema  Lab Results:   Recent Labs  03/23/14 1511 03/24/14 0713  WBC 12.1* 11.3*  HGB 9.2* 9.3*  HCT 27.7* 28.5*  PLT 98* 84*   BMET  Recent Labs  03/24/14 0713 03/25/14 0608  NA 135* 134*  K 4.3 4.4  CL 91* 92*  CO2 21 21  GLUCOSE 150* 149*  BUN 45* 50*  CREATININE 4.81* 5.39*  CALCIUM 8.2* 7.7*   PT/INR  Recent Labs  03/24/14 0832  LABPROT 13.2  INR 1.02   ABG No results found for this basename: PHART, PCO2, PO2, HCO3,  in the last 72 hours  Studies/Results: Dg Chest 1 View  03/24/2014   CLINICAL DATA:  Left internal jugular line placement  EXAM: CHEST - 1 VIEW  COMPARISON:  DG CHEST 1V PORT dated 02/25/2014  FINDINGS: Dual lumen central line on the right is stable. Left internal jugular line has been placed with tip obscured by existing central line but at least as far as the superior vena cava, possibly into right atrium or even right ventricle. There is no pneumothorax status post placement.  There is increased consolidation in the right middle lobe in the perihilar area.  IMPRESSION: 1. No pneumothorax status post left central line placement 2. Cannot identified tip of recently placed line, as it is obscured by existing dual lumen central line 3. Increased right perihilar consolidation   Electronically Signed   By: Skipper Cliche M.D.   On: 03/24/2014 17:22   US Pelvis Limited  03/23/2014   CLINICAL DATA:  Left groin palpable lump post cardiac  catheterization. Evaluate for abscess.  EXAM: US PELVIS LIMITED  TECHNIQUE: Ultrasound examination of the pelvic soft tissues was performed in the area of clinical concern.  COMPARISON:  None.  FINDINGS: From imaging of the left groin in the region of patient concern is performed. There is skin thickening and diffuse subcutaneous bands of edema. Deep to the subcutaneous bands of edema is a focal discrete complex fluid collection that measures 3.5 x 1.8 x 3.7 cm. No significant internal vascular flow is seen. There is some flow around the periphery of this collection.  IMPRESSION: 3.5 x 1.8 x 3.7 cm complex fluid collection within the subcutaneous tissues of the left groin. This could reflect an abscess or hematoma. There is overlying subcutaneous edema and skin thickening.   Electronically Signed   By: Curlene Dolphin M.D.   On: 03/23/2014 15:49   US Aspiration  03/24/2014   CLINICAL DATA:  Non-Hodgkin's lymphoma. Palpable region post cardiac catheterization. Deep subcutaneous complex collection noted on previous ultrasound.  EXAM: US ASPIRATION  COMPARISON:  03/23/2014  TECHNIQUE: Survey ultrasound of the left inguinal region was performed and a hypoechoic deep subcutaneous collection was identified corresponding to that seen on the prior study. Overlying skin prepped with Betadine, draped in usual sterile fashion, infiltrated locally with 1% lidocaine. An 18-gauge needle was advanced into the collection under ultrasound guidance. No fluid  could be aspirated. The needle was removed. No hemorrhage or other apparent complication.  IMPRESSION: 1. Attempted ultrasound-guided aspiration of left inguinal collection, with no fluid returned.   Electronically Signed   By: Arne Cleveland M.D.   On: 03/24/2014 16:59   Ir US Guide Vasc Access Left  03/24/2014   CLINICAL DATA:  Non-Hodgkin's lymphoma. Needs venous access. Indwelling right IJ hemodialysis catheter.  EXAM: LEFT IJ CENTRAL VENOUS CATHETER PLACEMENT UNDER  ULTRASOUND GUIDANCE:  TECHNIQUE: The procedure, risks (including but not limited to bleeding, infection, organ damage ), benefits, and alternatives were explained to the patient. Questions regarding the procedure were encouraged and answered. The patient understands and consents to the procedure. Patency of the left IJ vein was confirmed with ultrasound with image documentation. An appropriate skin site was determined. Skin site was marked. Region was prepped using maximum barrier technique including cap and mask, sterile gown, sterile gloves, large sterile sheet, and Chlorhexidine as cutaneous antisepsis. The region was infiltrated locally with 1% lidocaine. Under real-time ultrasound guidance, the left IJ vein was accessed with a 21 gauge micropuncture needle; the needle tip within the vein was confirmed with ultrasound image documentation. The needle exchanged over a guidewire for peel-away  sheath through which an 27cm 5 [place] PowerPICC triple lumen catheter was  advanced.  All 3 lumens flushed and aspirated easily.  Catheter was flushed  and sutured externally with 0-Prolene sutures. Patient tolerated the procedure  well, with no immediate complication.  IMPRESSION: 1. Technically successful left IJ double lumenpower injectable central venous catheter placement. Follow-up chest radiograph pending.   Electronically Signed   By: Arne Cleveland M.D.   On: 03/24/2014 16:49    Anti-infectives: Anti-infectives   Start     Dose/Rate Route Frequency Ordered Stop   02/18/2014 0953  ceFAZolin (ANCEF) 1-5 GM-% IVPB    Comments:  [name]   : cabinet override      03/01/2014 0938 02/22/2014 2159   02/26/2014 0953  ceFAZolin (ANCEF) 2-3 GM-% IVPB SOLR    Comments:  [name]   : cabinet override      03/13/2014 0953 03/14/2014 2159   03/13/2014 0600  [MAR Hold]  ceFAZolin (ANCEF) 3 g in dextrose 5 % 50 mL IVPB     (On MAR Hold since 02/27/2014 0850)   3 g 160 mL/hr over 30 Minutes Intravenous On call 03/07/14 1556  02/22/2014 1021   03/07/2014 0000  ceFAZolin (ANCEF) IVPB 1 g/50 mL premix  Status:  Discontinued    Comments:  Send with pt to OR   1 g 100 mL/hr over 30 Minutes Intravenous On call 03/07/14 1540 03/07/14 1555   03/01/14 1800  doxycycline (VIBRA-TABS) tablet 100 mg  Status:  Discontinued     100 mg Oral Every 12 hours 03/01/14 1522 03/10/14 1119   03/01/14 0800  ciprofloxacin (CIPRO) tablet 500 mg  Status:  Discontinued     500 mg Oral Daily with breakfast 02/28/14 1106 03/10/14 1119   02/26/14 0000  doxycycline (VIBRAMYCIN) 100 MG capsule     100 mg Oral 2 times daily 02/26/14 1153     02/26/14 0000  ciprofloxacin (CIPRO) 500 MG tablet     500 mg Oral 2 times daily 02/26/14 1153     02/25/14 1100  ciprofloxacin (CIPRO) IVPB 400 mg  Status:  Discontinued     400 mg 200 mL/hr over 60 Minutes Intravenous Every 12 hours 02/25/14 1047 02/28/14 1106   02/24/14 1830  doxycycline (VIBRAMYCIN) 100  mg in dextrose 5 % 250 mL IVPB  Status:  Discontinued     100 mg 125 mL/hr over 120 Minutes Intravenous Every 12 hours 02/24/14 1756 03/01/14 1521   02/24/14 1800  cefTRIAXone (ROCEPHIN) 1 g in dextrose 5 % 50 mL IVPB  Status:  Discontinued     1 g 100 mL/hr over 30 Minutes Intravenous Every 24 hours 02/24/14 1231 02/24/14 1756   02/24/14 1200  vancomycin (VANCOCIN) 1,500 mg in sodium chloride 0.9 % 250 mL IVPB  Status:  Discontinued     1,500 mg 250 mL/hr over 60 Minutes Intravenous  Once 02/23/14 1423 02/24/14 1231   02/20/14 2200  vancomycin (VANCOCIN) 1,500 mg in sodium chloride 0.9 % 500 mL IVPB  Status:  Discontinued     1,500 mg 250 mL/hr over 120 Minutes Intravenous Every 24 hours 03/11/2014 2119 02/22/14 1116   02/20/2014 2200  vancomycin (VANCOCIN) 2,000 mg in sodium chloride 0.9 % 500 mL IVPB     2,000 mg 250 mL/hr over 120 Minutes Intravenous  Once 02/23/2014 2119 02/20/14 0005      Assessment/Plan: s/p Procedure(s): Removal of catheter,  INSERTION OF DIALYSIS CATHETER right IJ  (Right)  Chronic groin wound  Continue local wound care currently.  If no signs of bleeding, would have VAC again placed. Nothing further to offer from surgery standpoint so will sign off for now  LOS: 34 days    Douglas [name] 03/25/2014

## 2014-03-25 NOTE — Progress Notes (Signed)
Assessment  58M with AKI(creat 0.9 on 01/18/14) in setting of ARB, nonhealing inguinal wound, and s/p treatment for NHL.  1. AKI, normal baseline GFR. Currently dialysis dependent. 2. NHL recent active therapy 3. Nonhealing L inguinal wound draining, bleeding today 4. Hypertension, now hypotensive with hypoalbuminemia and severe intravascular volume depletion.  Getting colloid in form of 5% albumin and will hold on HD today but plan for AM 5. Anemia, s/p PRBCs 6. Diabetes Mellitus  Subjective: Interval History: Bled from groin yesterday  Objective: Vital signs in last 24 hours: Temp:  [97.1 F (36.2 C)-98.3 F (36.8 C)] 97.7 F (36.5 C) (05/07 0832) Pulse Rate:  [95-101] 97 (05/07 0832) Resp:  [15-20] 16 (05/07 0832) BP: (75-94)/(36-59) 89/59 mmHg (05/07 0832) SpO2:  [90 %-100 %] 90 % (05/07 0832) Weight change:   Intake/Output from previous day:   Intake/Output this shift:   Possibly a little confused  General appearance: morbidly obese and foul odor in room Extremities: edema tr to 1+ BLE No asterixis  Lab Results:  Recent Labs  03/23/14 1511 03/24/14 0713  WBC 12.1* 11.3*  HGB 9.2* 9.3*  HCT 27.7* 28.5*  PLT 98* 84*   BMET:  Recent Labs  03/24/14 0713 03/25/14 0608  NA 135* 134*  K 4.3 4.4  CL 91* 92*  CO2 21 21  GLUCOSE 150* 149*  BUN 45* 50*  CREATININE 4.81* 5.39*  CALCIUM 8.2* 7.7*   No results found for this basename: PTH,  in the last 72 hours Iron Studies: No results found for this basename: IRON, TIBC, TRANSFERRIN, FERRITIN,  in the last 72 hours Studies/Results: Dg Chest 1 View  03/24/2014   CLINICAL DATA:  Left internal jugular line placement  EXAM: CHEST - 1 VIEW  COMPARISON:  DG CHEST 1V PORT dated 03/16/2014  FINDINGS: Dual lumen central line on the right is stable. Left internal jugular line has been placed with tip obscured by existing central line but at least as far as the superior vena cava, possibly into right atrium or even right  ventricle. There is no pneumothorax status post placement.  There is increased consolidation in the right middle lobe in the perihilar area.  IMPRESSION: 1. No pneumothorax status post left central line placement 2. Cannot identified tip of recently placed line, as it is obscured by existing dual lumen central line 3. Increased right perihilar consolidation   Electronically Signed   By: Skipper Cliche M.D.   On: 03/24/2014 17:22   US Pelvis Limited  03/23/2014   CLINICAL DATA:  Left groin palpable lump post cardiac catheterization. Evaluate for abscess.  EXAM: US PELVIS LIMITED  TECHNIQUE: Ultrasound examination of the pelvic soft tissues was performed in the area of clinical concern.  COMPARISON:  None.  FINDINGS: From imaging of the left groin in the region of patient concern is performed. There is skin thickening and diffuse subcutaneous bands of edema. Deep to the subcutaneous bands of edema is a focal discrete complex fluid collection that measures 3.5 x 1.8 x 3.7 cm. No significant internal vascular flow is seen. There is some flow around the periphery of this collection.  IMPRESSION: 3.5 x 1.8 x 3.7 cm complex fluid collection within the subcutaneous tissues of the left groin. This could reflect an abscess or hematoma. There is overlying subcutaneous edema and skin thickening.   Electronically Signed   By: Curlene Dolphin M.D.   On: 03/23/2014 15:49   US Aspiration  03/24/2014   CLINICAL DATA:  Non-Hodgkin's lymphoma.  Palpable region post cardiac catheterization. Deep subcutaneous complex collection noted on previous ultrasound.  EXAM: US ASPIRATION  COMPARISON:  03/23/2014  TECHNIQUE: Survey ultrasound of the left inguinal region was performed and a hypoechoic deep subcutaneous collection was identified corresponding to that seen on the prior study. Overlying skin prepped with Betadine, draped in usual sterile fashion, infiltrated locally with 1% lidocaine. An 18-gauge needle was advanced into the  collection under ultrasound guidance. No fluid could be aspirated. The needle was removed. No hemorrhage or other apparent complication.  IMPRESSION: 1. Attempted ultrasound-guided aspiration of left inguinal collection, with no fluid returned.   Electronically Signed   By: Arne Cleveland M.D.   On: 03/24/2014 16:59   Ir US Guide Vasc Access Left  03/24/2014   CLINICAL DATA:  Non-Hodgkin's lymphoma. Needs venous access. Indwelling right IJ hemodialysis catheter.  EXAM: LEFT IJ CENTRAL VENOUS CATHETER PLACEMENT UNDER ULTRASOUND GUIDANCE:  TECHNIQUE: The procedure, risks (including but not limited to bleeding, infection, organ damage ), benefits, and alternatives were explained to the patient. Questions regarding the procedure were encouraged and answered. The patient understands and consents to the procedure. Patency of the left IJ vein was confirmed with ultrasound with image documentation. An appropriate skin site was determined. Skin site was marked. Region was prepped using maximum barrier technique including cap and mask, sterile gown, sterile gloves, large sterile sheet, and Chlorhexidine as cutaneous antisepsis. The region was infiltrated locally with 1% lidocaine. Under real-time ultrasound guidance, the left IJ vein was accessed with a 21 gauge micropuncture needle; the needle tip within the vein was confirmed with ultrasound image documentation. The needle exchanged over a guidewire for peel-away  sheath through which an 27cm 5 [place] PowerPICC triple lumen catheter was  advanced.  All 3 lumens flushed and aspirated easily.  Catheter was flushed  and sutured externally with 0-Prolene sutures. Patient tolerated the procedure  well, with no immediate complication.  IMPRESSION: 1. Technically successful left IJ double lumenpower injectable central venous catheter placement. Follow-up chest radiograph pending.   Electronically Signed   By: Arne Cleveland M.D.   On: 03/24/2014 16:49   Scheduled: . albumin  human  12.5 g Intravenous Q6H  . bisacodyl  10 mg Rectal Daily  . docusate sodium  200 mg Oral BID  . feeding supplement (NEPRO CARB STEADY)  237 mL Oral BID BM  . heparin subcutaneous  5,000 Units Subcutaneous 3 times per day  . insulin aspart  0-9 Units Subcutaneous TID WC  . lactulose  30 g Oral BID  . midodrine  10 mg Oral TID WC  .  morphine injection  1 mg Intravenous Once  . multivitamin  1 tablet Oral QHS  . pantoprazole  40 mg Oral Q1200  . polyethylene glycol  17 g Oral Daily  . senna  2 tablet Oral Daily  . sodium bicarbonate  1,300 mg Oral TID  . sodium chloride  3 mL Intravenous Q12H     LOS: 34 days   Estanislado Emms 03/25/2014,11:25 AM

## 2014-03-26 ENCOUNTER — Inpatient Hospital Stay (HOSPITAL_COMMUNITY): Payer: BC Managed Care – PPO

## 2014-03-26 DIAGNOSIS — J9600 Acute respiratory failure, unspecified whether with hypoxia or hypercapnia: Secondary | ICD-10-CM

## 2014-03-26 DIAGNOSIS — N179 Acute kidney failure, unspecified: Secondary | ICD-10-CM

## 2014-03-26 LAB — CBC
HCT: 25.4 % — ABNORMAL LOW (ref 39.0–52.0)
Hemoglobin: 8 g/dL — ABNORMAL LOW (ref 13.0–17.0)
MCH: 28.5 pg (ref 26.0–34.0)
MCHC: 31.5 g/dL (ref 30.0–36.0)
MCV: 90.4 fL (ref 78.0–100.0)
PLATELETS: 63 10*3/uL — AB (ref 150–400)
RBC: 2.81 MIL/uL — AB (ref 4.22–5.81)
RDW: 20 % — ABNORMAL HIGH (ref 11.5–15.5)
WBC: 9.6 10*3/uL (ref 4.0–10.5)

## 2014-03-26 LAB — BLOOD GAS, ARTERIAL
ACID-BASE DEFICIT: 6.2 mmol/L — AB (ref 0.0–2.0)
Acid-base deficit: 6 mmol/L — ABNORMAL HIGH (ref 0.0–2.0)
Acid-base deficit: 5.6 mmol/L — ABNORMAL HIGH (ref 0.0–2.0)
BICARBONATE: 21 meq/L (ref 20.0–24.0)
BICARBONATE: 22.1 meq/L (ref 20.0–24.0)
Bicarbonate: 21.2 mEq/L (ref 20.0–24.0)
DRAWN BY: 32470
Drawn by: 36496
Drawn by: 39866
FIO2: 100 %
FIO2: 0.28 %
FIO2: 1 %
MECHVT: 430 mL
O2 SAT: 96.1 %
O2 SAT: 93.6 %
O2 Saturation: 84.6 %
PATIENT TEMPERATURE: 98.6
PCO2 ART: 65.3 mmHg — AB (ref 35.0–45.0)
PEEP: 5 cmH2O
PH ART: 7.165 — AB (ref 7.350–7.450)
PO2 ART: 83.1 mmHg (ref 80.0–100.0)
PO2 ART: 57.3 mmHg — AB (ref 80.0–100.0)
Patient temperature: 97.8
Patient temperature: 97.8
RATE: 28 resp/min
TCO2: 22.7 mmol/L (ref 0–100)
TCO2: 24.1 mmol/L (ref 0–100)
TCO2: 23.1 mmol/L (ref 0–100)
pCO2 arterial: 57.2 mmHg (ref 35.0–45.0)
pCO2 arterial: 60.8 mmHg (ref 35.0–45.0)
pH, Arterial: 7.19 — CL (ref 7.350–7.450)
pH, Arterial: 7.152 — CL (ref 7.350–7.450)
pO2, Arterial: 101 mmHg — ABNORMAL HIGH (ref 80.0–100.0)

## 2014-03-26 LAB — COMPREHENSIVE METABOLIC PANEL
ALK PHOS: 169 U/L — AB (ref 39–117)
ALT: 14 U/L (ref 0–53)
AST: 123 U/L — ABNORMAL HIGH (ref 0–37)
Albumin: 1.8 g/dL — ABNORMAL LOW (ref 3.5–5.2)
BILIRUBIN TOTAL: 0.7 mg/dL (ref 0.3–1.2)
BUN: 31 mg/dL — AB (ref 6–23)
CO2: 21 meq/L (ref 19–32)
Calcium: 7.2 mg/dL — ABNORMAL LOW (ref 8.4–10.5)
Chloride: 95 mEq/L — ABNORMAL LOW (ref 96–112)
Creatinine, Ser: 3.61 mg/dL — ABNORMAL HIGH (ref 0.50–1.35)
GFR calc Af Amer: 20 mL/min — ABNORMAL LOW (ref >90)
GFR, EST NON AFRICAN AMERICAN: 17 mL/min — AB (ref >90)
GLUCOSE: 156 mg/dL — AB (ref 70–99)
POTASSIUM: 4 meq/L (ref 3.7–5.3)
Sodium: 136 mEq/L — ABNORMAL LOW (ref 137–147)
Total Protein: 5.1 g/dL — ABNORMAL LOW (ref 6.0–8.3)

## 2014-03-26 LAB — BASIC METABOLIC PANEL
BUN: 57 mg/dL — ABNORMAL HIGH (ref 6–23)
CALCIUM: 7.6 mg/dL — AB (ref 8.4–10.5)
CO2: 22 mEq/L (ref 19–32)
Chloride: 92 mEq/L — ABNORMAL LOW (ref 96–112)
Creatinine, Ser: 5.8 mg/dL — ABNORMAL HIGH (ref 0.50–1.35)
GFR, EST AFRICAN AMERICAN: 11 mL/min — AB (ref >90)
GFR, EST NON AFRICAN AMERICAN: 10 mL/min — AB (ref >90)
GLUCOSE: 155 mg/dL — AB (ref 70–99)
POTASSIUM: 4.7 meq/L (ref 3.7–5.3)
SODIUM: 135 meq/L — AB (ref 137–147)

## 2014-03-26 LAB — HEMOGLOBIN AND HEMATOCRIT, BLOOD
HCT: 29.7 % — ABNORMAL LOW (ref 39.0–52.0)
Hemoglobin: 9.5 g/dL — ABNORMAL LOW (ref 13.0–17.0)

## 2014-03-26 LAB — LACTIC ACID, PLASMA: LACTIC ACID, VENOUS: 4 mmol/L — AB (ref 0.5–2.2)

## 2014-03-26 LAB — GLUCOSE, CAPILLARY
GLUCOSE-CAPILLARY: 138 mg/dL — AB (ref 70–99)
Glucose-Capillary: 157 mg/dL — ABNORMAL HIGH (ref 70–99)

## 2014-03-26 LAB — PREPARE RBC (CROSSMATCH)

## 2014-03-26 MED ORDER — PHENYLEPHRINE HCL 10 MG/ML IJ SOLN
30.0000 ug/min | INTRAMUSCULAR | Status: DC
  Administered 2014-03-26: 50 ug/min via INTRAVENOUS
  Filled 2014-03-26 (×4): qty 1

## 2014-03-26 MED ORDER — LIDOCAINE HCL (PF) 1 % IJ SOLN: 5.0000 mL | INTRAMUSCULAR | Status: DC | PRN

## 2014-03-26 MED ORDER — SODIUM BICARBONATE 8.4 % IV SOLN
INTRAVENOUS | Status: AC
  Filled 2014-03-26: qty 50

## 2014-03-26 MED ORDER — DIPHENHYDRAMINE HCL 50 MG/ML IJ SOLN
INTRAMUSCULAR | Status: AC
  Filled 2014-03-26: qty 1

## 2014-03-26 MED ORDER — PENTAFLUOROPROP-TETRAFLUOROETH EX AERO: 1.0000 "application " | INHALATION_SPRAY | CUTANEOUS | Status: DC | PRN

## 2014-03-26 MED ORDER — SODIUM CHLORIDE 0.9 % IV SOLN
3.0000 ug/kg/min | INTRAVENOUS | Status: DC
  Administered 2014-03-27 (×2): 3 ug/kg/min via INTRAVENOUS
  Administered 2014-03-28: 1 ug/kg/min via INTRAVENOUS
  Filled 2014-03-26 (×4): qty 20

## 2014-03-26 MED ORDER — CHLORHEXIDINE GLUCONATE 0.12 % MT SOLN
15.0000 mL | Freq: Two times a day (BID) | OROMUCOSAL | Status: DC
  Administered 2014-03-26 – 2014-03-29 (×6): 15 mL via OROMUCOSAL
  Filled 2014-03-26 (×6): qty 15

## 2014-03-26 MED ORDER — BIOTENE DRY MOUTH MT LIQD
1.0000 "application " | Freq: Four times a day (QID) | OROMUCOSAL | Status: DC
  Administered 2014-03-27 – 2014-03-29 (×12): 15 mL via OROMUCOSAL

## 2014-03-26 MED ORDER — SODIUM BICARBONATE 8.4 % IV SOLN
INTRAVENOUS | Status: DC
  Administered 2014-03-26 – 2014-03-27 (×2): via INTRAVENOUS
  Filled 2014-03-26 (×3): qty 150

## 2014-03-26 MED ORDER — SODIUM BICARBONATE 8.4 % IV SOLN
100.0000 meq | Freq: Once | INTRAVENOUS | Status: AC
  Administered 2014-03-26: 100 meq via INTRAVENOUS
  Filled 2014-03-26: qty 100

## 2014-03-26 MED ORDER — PANTOPRAZOLE SODIUM 40 MG IV SOLR: 40.0000 mg | Freq: Two times a day (BID) | INTRAVENOUS | Status: DC

## 2014-03-26 MED ORDER — IPRATROPIUM-ALBUTEROL 0.5-2.5 (3) MG/3ML IN SOLN
3.0000 mL | Freq: Once | RESPIRATORY_TRACT | Status: AC
  Administered 2014-03-26: 3 mL via RESPIRATORY_TRACT
  Filled 2014-03-26: qty 3

## 2014-03-26 MED ORDER — ETOMIDATE 2 MG/ML IV SOLN
30.0000 mg | Freq: Once | INTRAVENOUS | Status: AC
  Administered 2014-03-26: 30 mg via INTRAVENOUS

## 2014-03-26 MED ORDER — SODIUM CHLORIDE 0.9 % IV SOLN
1.0000 mg/h | INTRAVENOUS | Status: DC
  Administered 2014-03-26 – 2014-03-27 (×2): 2 mg/h via INTRAVENOUS
  Administered 2014-03-27: 6 mg/h via INTRAVENOUS
  Administered 2014-03-28: 4 mg/h via INTRAVENOUS
  Administered 2014-03-29: 3 mg/h via INTRAVENOUS
  Administered 2014-03-29: 6 mg/h via INTRAVENOUS
  Filled 2014-03-26 (×6): qty 10

## 2014-03-26 MED ORDER — NEPRO/CARBSTEADY PO LIQD
237.0000 mL | ORAL | Status: DC | PRN
  Filled 2014-03-26: qty 237

## 2014-03-26 MED ORDER — NOREPINEPHRINE BITARTRATE 1 MG/ML IV SOLN
2.0000 ug/min | INTRAVENOUS | Status: DC
  Administered 2014-03-26: 10 ug/min via INTRAVENOUS
  Administered 2014-03-27 (×2): 50 ug/min via INTRAVENOUS
  Administered 2014-03-28 (×2): 30 ug/min via INTRAVENOUS
  Administered 2014-03-29 (×3): 50 ug/min via INTRAVENOUS
  Filled 2014-03-26 (×11): qty 16

## 2014-03-26 MED ORDER — SODIUM CHLORIDE 0.9 % IV SOLN: 100.0000 mL | INTRAVENOUS | Status: DC | PRN

## 2014-03-26 MED ORDER — PHENYLEPHRINE HCL 10 MG/ML IJ SOLN
30.0000 ug/min | INTRAVENOUS | Status: DC
  Administered 2014-03-27 (×3): 200 ug/min via INTRAVENOUS
  Administered 2014-03-28: 40 ug/min via INTRAVENOUS
  Filled 2014-03-26 (×6): qty 4

## 2014-03-26 MED ORDER — LIDOCAINE-PRILOCAINE 2.5-2.5 % EX CREA
1.0000 | TOPICAL_CREAM | CUTANEOUS | Status: DC | PRN
Start: 2014-03-26 — End: 2014-03-26
  Filled 2014-03-26: qty 5

## 2014-03-26 MED ORDER — ALTEPLASE 2 MG IJ SOLR
2.0000 mg | Freq: Once | INTRAMUSCULAR | Status: DC | PRN
  Filled 2014-03-26: qty 2

## 2014-03-26 MED ORDER — SODIUM CHLORIDE 0.9 % IV SOLN
2000.0000 mg | Freq: Once | INTRAVENOUS | Status: AC
  Administered 2014-03-26: 2000 mg via INTRAVENOUS
  Filled 2014-03-26: qty 2000

## 2014-03-26 MED ORDER — HEPARIN SODIUM (PORCINE) 1000 UNIT/ML DIALYSIS: 1000.0000 [IU] | INTRAMUSCULAR | Status: DC | PRN

## 2014-03-26 MED ORDER — SODIUM CHLORIDE 0.9 % IV SOLN
8.0000 mg/h | INTRAVENOUS | Status: DC
  Administered 2014-03-26 – 2014-03-27 (×2): 8 mg/h via INTRAVENOUS
  Filled 2014-03-26 (×5): qty 80

## 2014-03-26 MED ORDER — SODIUM CHLORIDE 0.9 % IV SOLN
80.0000 mg | Freq: Once | INTRAVENOUS | Status: DC
  Administered 2014-03-26: 80 mg via INTRAVENOUS
  Filled 2014-03-26: qty 80

## 2014-03-26 MED ORDER — SODIUM CHLORIDE 0.9 % IV SOLN
8.0000 mg/h | INTRAVENOUS | Status: DC
  Administered 2014-03-26: 8 mg/h via INTRAVENOUS
  Filled 2014-03-26 (×2): qty 80

## 2014-03-26 MED ORDER — SODIUM CHLORIDE 0.9 % IV SOLN
25.0000 ug/h | INTRAVENOUS | Status: DC
  Administered 2014-03-26: 100 ug/h via INTRAVENOUS
  Administered 2014-03-27: 175 ug/h via INTRAVENOUS
  Administered 2014-03-27: 300 ug/h via INTRAVENOUS
  Administered 2014-03-28: 175 ug/h via INTRAVENOUS
  Administered 2014-03-29: 300 ug/h via INTRAVENOUS
  Administered 2014-03-29: 150 ug/h via INTRAVENOUS
  Filled 2014-03-26 (×6): qty 50

## 2014-03-26 MED ORDER — DOPAMINE-DEXTROSE 3.2-5 MG/ML-% IV SOLN
INTRAVENOUS | Status: AC
  Filled 2014-03-26: qty 250

## 2014-03-26 MED ORDER — CISATRACURIUM BOLUS VIA INFUSION
10.0000 mg | Freq: Once | INTRAVENOUS | Status: AC
  Administered 2014-03-26: 10 mg via INTRAVENOUS
  Filled 2014-03-26: qty 10

## 2014-03-26 MED ORDER — SUCCINYLCHOLINE CHLORIDE 20 MG/ML IJ SOLN
100.0000 mg | Freq: Once | INTRAMUSCULAR | Status: AC
  Administered 2014-03-26: 100 mg via INTRAVENOUS

## 2014-03-26 MED ORDER — FENTANYL CITRATE 0.05 MG/ML IJ SOLN
100.0000 ug | Freq: Once | INTRAMUSCULAR | Status: AC
  Administered 2014-03-26: 100 ug via INTRAVENOUS

## 2014-03-26 MED ORDER — HEPARIN SODIUM (PORCINE) 1000 UNIT/ML DIALYSIS
20.0000 [IU]/kg | INTRAMUSCULAR | Status: DC | PRN
  Filled 2014-03-26: qty 5

## 2014-03-26 MED ORDER — ONDANSETRON HCL 4 MG/2ML IJ SOLN
INTRAMUSCULAR | Status: AC
  Filled 2014-03-26: qty 2

## 2014-03-26 MED ORDER — TOBRAMYCIN SULFATE 80 MG/2ML IJ SOLN
2.5000 mg/kg | Freq: Once | INTRAVENOUS | Status: AC
  Administered 2014-03-27: 340 mg via INTRAVENOUS
  Filled 2014-03-26: qty 8.5

## 2014-03-26 MED ORDER — ARTIFICIAL TEARS OP OINT
1.0000 "application " | TOPICAL_OINTMENT | Freq: Three times a day (TID) | OPHTHALMIC | Status: DC
  Administered 2014-03-26 – 2014-03-29 (×8): 1 via OPHTHALMIC
  Filled 2014-03-26 (×2): qty 3.5

## 2014-03-26 MED ORDER — DEXTROSE 5 % IV SOLN
2.0000 g | INTRAVENOUS | Status: AC
  Administered 2014-03-26: 2 g via INTRAVENOUS
  Filled 2014-03-26: qty 2

## 2014-03-26 MED ORDER — FENTANYL CITRATE 0.05 MG/ML IJ SOLN
INTRAMUSCULAR | Status: AC
  Filled 2014-03-26: qty 2

## 2014-03-26 NOTE — Procedures (Signed)
Arterial Catheter Insertion Procedure Note Bienvenido Proehl [id number] 1956-07-05  Procedure: Insertion of Arterial Catheter  Indications: Blood pressure monitoring and Frequent blood sampling  Procedure Details Consent: Unable to obtain consent because of emergent medical necessity. Time Out: Verified patient identification, verified procedure, site/side was marked, verified correct patient position, special equipment/implants available, medications/allergies/relevent history reviewed, required imaging and test results available.  Performed  Maximum sterile technique was used including antiseptics, cap, gloves, gown, hand hygiene, mask and sheet. Skin prep: Chlorhexidine; local anesthetic administered 20 gauge catheter was inserted into right radial artery using the Seldinger technique.  Evaluation Blood flow good; BP tracing good. Complications: No apparent complications.   Dulcy Fanny 03/26/2014

## 2014-03-26 NOTE — Progress Notes (Signed)
[facility]                                                                              Patient Demographics  Douglas [name], is a 58 y.o. male, DOB - [birth year], MWU:[id number]  Admit date - 2014-03-10   Admitting Physician Chesley Mires, MD  Outpatient Primary MD for the patient is [name], MD  LOS - 80   Chief Complaint  Patient presents with  . Shortness of Breath     Brief summary Patient is a 58 year old male with history of follicular low-grade non-Hodgkin lymphoma, large left groin lymphadenopathy, diagnosed in 2000 and was on recent chemotherapy, had admission for acute renal failure 3/17- 3/20. He was suspected to have acute renal failure de to HCTZ and ARB.  He had outpatient left-sided inguinal node biopsy after which he has developed a large lymph filled seroma on the left groin requiring wound VAC placement and continuous drainage. Have discussed his case multiple times with general surgery who do not recommend any further surgical intervention. Once he has a rehabilitation bed he could be placed in a rehabilitation as he requires intermittent IV fluid boluses for ongoing fluid loss due to daily lymph drainage from the wound .   Assessment & Plan   Acute kidney injury potentially related to tumor lysis, now end-stage renal disease -Nephrology consulted and appreciated -currently has Right IJ dialysis catheter  -Patient will dialyze today  Hodgkin's lymphoma with follicular low-grade, large left groin lymphadenopathy status post chemotherapy/hyperuricemia -s/p Bendamustine and rituximab 02/03/14  -Hyperuricemia resolved, oncology following (Dr. Jana Hakim)- recommendations: stable G6PD, LDH levels are around 600 which is twice normal, transfuse PRBCs as needed. CT abdomen for restaging outpatient home along with outpatient followup with oncology post discharge.   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 -Consulted IR for possible drainage, however, no fluid returned with aspiration -Surgery wanted wound vac to be placed, however, Wound care nurse stated this was a contraindication due to active bleeding and/or risk of bleeding  Intermittent tachycardia and hypotension -Likely due to removal of fluid during hemodialysis versus ongoing loss from left inguinal -Spoke with Dr. Florene Glen, nephrologist, will try to use albumin to expand volume,ontinue IV fluid and albumin -Continue midodrine -PCCM consulted and does not recommend pressors at this time due to patient being asymptomatic  Type 2 diabetes mellitus  -Continue insulin sliding scale with CBG monitoring  GERD -Continue PPI  Constipation -Continue bowel regimen  Code Status: Full  Family Communication: None at bedside  Disposition Plan: Admitted, possible LTAC placement  Time Spent in minutes   25 minutes  Procedures/Significant Events 4/3 Hyperkalemia, hyperuricemia, hyperphosphatemia, with ARF requiring emergent CRRT. UA with urate crystals. Renal US normal. No pulmonary edema on CXR.  4/4  ARF thought to be due to ATN from severe sepsis, per oncology not tumor lysis syndrome. No DVT, heparin gtt stopped. 2D-echo normal EF  4/5 Continue CRRT, to transition to HD, no renal recovery yet. Hyperuricemia resolved. Dialysis access is a right IJ dialysis catheter  4/6 .Wound vac placed  4/7 -hemodialysis initiated  5/6 IR to drain left inguinal fluid  collection, attempted  5/6 IR placed Left IJ TL CVC under [place]  Consults   Nephrology Interventional radiology PCCM Oncology  DVT Prophylaxis   SCDs  Lab Results  Component Value Date   PLT 63* 03/26/2014    Medications  Scheduled Meds: . albumin human  12.5 g Intravenous Q6H  . bisacodyl  10 mg Rectal Daily  . docusate sodium  200 mg Oral BID  . feeding supplement (NEPRO CARB STEADY)  237 mL Oral BID BM  . heparin subcutaneous  5,000 Units Subcutaneous 3 times per day  . insulin aspart  0-9 Units Subcutaneous TID WC  . lactulose  30 g Oral BID  . midodrine  10 mg Oral TID WC  .  morphine injection  1 mg Intravenous Once  . multivitamin  1 tablet Oral QHS  . pantoprazole  40 mg Oral Q1200  . polyethylene glycol  17 g Oral Daily  . senna  2 tablet Oral Daily  . sodium bicarbonate  1,300 mg Oral TID  . sodium chloride  3 mL Intravenous Q12H   Continuous Infusions: . sodium chloride 10 mL/hr at 03/26/14 1045   PRN Meds:.diphenhydrAMINE, guaiFENesin-dextromethorphan, heparin, hydrOXYzine, oxyCODONE-acetaminophen, simethicone, sodium chloride, sodium chloride  Antibiotics    Anti-infectives   Start     Dose/Rate Route Frequency Ordered Stop   03/13/2014 0953  ceFAZolin (ANCEF) 1-5 GM-% IVPB    Comments:  [name]   : cabinet override      03/07/2014 4696 03/16/2014 2159   03/02/2014 0953  ceFAZolin (ANCEF) 2-3 GM-% IVPB SOLR    Comments:  [name]   : cabinet override      03/15/2014 0953 03/13/2014 2159   03/04/2014 0600  [MAR Hold]  ceFAZolin (ANCEF) 3 g in dextrose 5 % 50 mL IVPB     (On MAR Hold since 02/17/2014 0850)   3 g 160 mL/hr over 30 Minutes Intravenous On call 03/07/14 1556 03/13/2014 1021   03/04/2014 0000  ceFAZolin (ANCEF) IVPB 1 g/50 mL premix  Status:  Discontinued    Comments:  Send with pt to OR   1 g 100 mL/hr over 30 Minutes Intravenous On call 03/07/14 1540 03/07/14 1555   03/01/14 1800  doxycycline (VIBRA-TABS) tablet 100 mg  Status:  Discontinued      100 mg Oral Every 12 hours 03/01/14 1522 03/10/14 1119   03/01/14 0800  ciprofloxacin (CIPRO) tablet 500 mg  Status:  Discontinued     500 mg Oral Daily with breakfast 02/28/14 1106 03/10/14 1119   02/26/14 0000  doxycycline (VIBRAMYCIN) 100 MG capsule     100 mg Oral 2 times daily 02/26/14 1153     02/26/14 0000  ciprofloxacin (CIPRO) 500 MG tablet     500 mg Oral 2 times daily 02/26/14 1153     02/25/14 1100  ciprofloxacin (CIPRO) IVPB 400 mg  Status:  Discontinued     400 mg 200 mL/hr over 60 Minutes Intravenous Every 12 hours 02/25/14 1047 02/28/14 1106   02/24/14 1830  doxycycline (VIBRAMYCIN) 100 mg in dextrose 5 % 250 mL IVPB  Status:  Discontinued  100 mg 125 mL/hr over 120 Minutes Intravenous Every 12 hours 02/24/14 1756 03/01/14 1521   02/24/14 1800  cefTRIAXone (ROCEPHIN) 1 g in dextrose 5 % 50 mL IVPB  Status:  Discontinued     1 g 100 mL/hr over 30 Minutes Intravenous Every 24 hours 02/24/14 1231 02/24/14 1756   02/24/14 1200  vancomycin (VANCOCIN) 1,500 mg in sodium chloride 0.9 % 250 mL IVPB  Status:  Discontinued     1,500 mg 250 mL/hr over 60 Minutes Intravenous  Once 02/23/14 1423 02/24/14 1231   02/20/14 2200  vancomycin (VANCOCIN) 1,500 mg in sodium chloride 0.9 % 500 mL IVPB  Status:  Discontinued     1,500 mg 250 mL/hr over 120 Minutes Intravenous Every 24 hours 20-Feb-2014 2119 02/22/14 1116   2014-02-20 2200  vancomycin (VANCOCIN) 2,000 mg in sodium chloride 0.9 % 500 mL IVPB     2,000 mg 250 mL/hr over 120 Minutes Intravenous  Once 2014-02-20 2119 02/20/14 0005        Subjective:   Costella Hatcher seen and examined today.  Patient states he still has pain, however, improved since yesterday.  Denies dizziness, chest pain, shortness of breath.    Objective:   Filed Vitals:   03/25/14 1722 03/25/14 2113 03/26/14 0629 03/26/14 0813  BP: 91/49 81/46 74/39  106/63  Pulse: 91 90 100 98  Temp: 98 F (36.7 C) 98.3 F (36.8 C) 97.8 F (36.6 C) 98.2 F (36.8 C)   TempSrc: Oral Oral Oral Oral  Resp: 18 18 16 16   Height:      Weight:  228.6 kg (503 lb 15.5 oz)    SpO2: 95% 93% 97% 96%    Wt Readings from Last 3 Encounters:  03/25/14 228.6 kg (503 lb 15.5 oz)  03/25/14 228.6 kg (503 lb 15.5 oz)  02/05/14 183.435 kg (404 lb 6.4 oz)     Intake/Output Summary (Last 24 hours) at 03/26/14 1220 Last data filed at 03/26/14 0900  Gross per 24 hour  Intake    250 ml  Output      0 ml  Net    250 ml    Exam  General: Well developed, morbidly obese, NAD   HEENT: NCAT, mucous membranes moist.  Neck: Supple, no JVD, no masses  Cardiovascular: S1 S2 auscultated, distant heart sounds  Respiratory: Clear to auscultation bilaterally with equal chest rise  Abdomen: Soft, nontender, nondistended, + bowel sounds  Extremities: warm dry without cyanosis clubbing, bilateral lower extremity edema  Neuro: AAOx3, No focal deficits. Able to move all 4 extremities  Skin: Packing noted in left inguinal area  Psych: Normal affect and demeanor with intact judgement and insight  Data Review   Micro Results No results found for this or any previous visit (from the past 240 hour(s)).  Radiology Reports US Pelvis Limited  03/23/2014   CLINICAL DATA:  Left groin palpable lump post cardiac catheterization. Evaluate for abscess.  EXAM: US PELVIS LIMITED  TECHNIQUE: Ultrasound examination of the pelvic soft tissues was performed in the area of clinical concern.  COMPARISON:  None.  FINDINGS: From imaging of the left groin in the region of patient concern is performed. There is skin thickening and diffuse subcutaneous bands of edema. Deep to the subcutaneous bands of edema is a focal discrete complex fluid collection that measures 3.5 x 1.8 x 3.7 cm. No significant internal vascular flow is seen. There is some flow around the periphery of this collection.  IMPRESSION: 3.5 x 1.8 x  3.7 cm complex fluid collection within the subcutaneous tissues of the left groin. This  could reflect an abscess or hematoma. There is overlying subcutaneous edema and skin thickening.   Electronically Signed   By: Curlene Dolphin M.D.   On: 03/23/2014 15:49   Dg Chest Port 1 View  03/21/2014   CLINICAL DATA:  Breath  EXAM: PORTABLE CHEST - 1 VIEW  COMPARISON:  DG CHEST 1V PORT dated 02/20/2014  FINDINGS: There is a right-sided dual-lumen central venous catheter in satisfactory position.  There is bilateral diffuse interstitial thickening. There is no pleural effusion or pneumothorax. There is a persistent right lower lobe pulmonary nodule. There is no other focal parenchymal opacity. There is enlargement of the central pulmonary vasculature. He cardiomediastinal silhouette is stable. The osseous structures are unremarkable.  IMPRESSION: 1. Findings of concerning for mild interstitial edema. 2. Stable right lower lobe pulmonary nodule.   Electronically Signed   By: Kathreen Devoid   On: 03/21/2014 23:41   Dg Chest Port 1 View  02/21/2014   CLINICAL DATA:  Status post catheter placement  EXAM: PORTABLE CHEST - 1 VIEW  COMPARISON:  DG CHEST 1V PORT dated 02/24/2014; CT CHEST W/CM dated 12/02/2013; DG CHEST 1 VIEW dated 02/03/2014  FINDINGS: There is a dual-lumen right-sided central venous catheter in satisfactory position. There is a 2.8 cm right lower lobe pulmonary nodule. There is no other focal consolidation, pleural effusion or pneumothorax. Stable cardiac silhouette. Bilateral hilar enlargement  The osseous structures are unremarkable.  IMPRESSION: 1. Satisfactory position of a dialysis catheter. 2. Interval enlargement of a right lower lobe pulmonary nodule.   Electronically Signed   By: Kathreen Devoid   On: 02/25/2014 13:11   Dg Chest Port 1 View  02/25/2014   CLINICAL DATA:  Cough and chest pain  EXAM: PORTABLE CHEST - 1 VIEW  COMPARISON:  02/23/2014  FINDINGS: Unchanged positioning of right IJ catheter, tip at the lower SVC. Unchanged heart size and mediastinal contours.  No edema, effusion,  consolidation, or pneumothorax. Right-sided pulmonary parenchymal nodularity is again noted, with a dominant 2 cm nodule in the lower right chest, likely representing a middle lobe nodule seen on previous chest CT 12/02/2013. These are stable from recent comparison examinations, but likely progressed from 12/02/2013.  IMPRESSION: 1.  No active disease. 2. Right lung nodularity which is stable compared to recent studies, but enlarged from 12/02/2013 chest CT comparison.   Electronically Signed   By: Jorje Guild M.D.   On: 02/25/2014 01:21   Dg Chest Port 1 View  02/23/2014   CLINICAL DATA:  Respiratory distress.  EXAM: PORTABLE CHEST - 1 VIEW  COMPARISON:  DG CHEST 1V PORT dated 02/20/2014; CT CHEST W/CM dated 12/02/2013; DG CHEST 1V PORT dated 03/17/2014; DG CHEST 1V PORT dated 03/16/2014  FINDINGS: Cardiomediastinal silhouette remains stable and normal. Dialysis catheter tips SVC RA junction, unchanged. No focal infiltrates, although right lung nodular densities persist. Negative osseous structures.  IMPRESSION: No active infiltrates.   Electronically Signed   By: Rolla Flatten M.D.   On: 02/23/2014 08:10   Dg Fluoro Guide Cv Line-no Report  03/09/2014   CLINICAL DATA: insertion diatek in OR# 12   FLOURO GUIDE CV LINE  Fluoroscopy was utilized by the requesting physician.  No radiographic  interpretation.     CBC  Recent Labs Lab 03/22/14 0717 03/23/14 1511 03/24/14 0713 03/26/14 0602  WBC 13.2* 12.1* 11.3* 9.6  HGB 7.2* 9.2* 9.3* 8.0*  HCT 22.7* 27.7*  28.5* 25.4*  PLT 114* 98* 84* 63*  MCV 89.0 86.6 88.0 90.4  MCH 28.2 28.8 28.7 28.5  MCHC 31.7 33.2 32.6 31.5  RDW 20.1* 19.3* 19.6* 20.0*    Chemistries   Recent Labs Lab 03/22/14 0617 03/23/14 1246 03/24/14 0713 03/25/14 0608 03/26/14 0602  NA 131* 131* 135* 134* 135*  K 4.9 5.0 4.3 4.4 4.7  CL 87* 91* 91* 92* 92*  CO2 19 19 21 21 22   GLUCOSE 128* 132* 150* 149* 155*  BUN 53* 39* 45* 50* 57*  CREATININE 5.24* 3.99* 4.81* 5.39*  5.80*  CALCIUM 8.9 7.8* 8.2* 7.7* 7.6*   ------------------------------------------------------------------------------------------------------------------ estimated creatinine clearance is 27.2 ml/min (by C-G formula based on Cr of 5.8). ------------------------------------------------------------------------------------------------------------------ No results found for this basename: HGBA1C,  in the last 72 hours ------------------------------------------------------------------------------------------------------------------ No results found for this basename: CHOL, HDL, LDLCALC, TRIG, CHOLHDL, LDLDIRECT,  in the last 72 hours ------------------------------------------------------------------------------------------------------------------ No results found for this basename: TSH, T4TOTAL, FREET3, T3FREE, THYROIDAB,  in the last 72 hours ------------------------------------------------------------------------------------------------------------------ No results found for this basename: VITAMINB12, FOLATE, FERRITIN, TIBC, IRON, RETICCTPCT,  in the last 72 hours  Coagulation profile  Recent Labs Lab 03/24/14 0832  INR 1.02    No results found for this basename: DDIMER,  in the last 72 hours  Cardiac Enzymes No results found for this basename: CK, CKMB, TROPONINI, MYOGLOBIN,  in the last 168 hours ------------------------------------------------------------------------------------------------------------------ No components found with this basename: POCBNP,     Kayslee Furey D.O. on 03/26/2014 at 12:20 PM  Between 7am to 7pm - Pager - [phone]  After 7pm go to [email or web address] - password [email or web address]  And look for the night coverage person covering for me after hours  [facility] Office  [phone]

## 2014-03-26 NOTE — Progress Notes (Signed)
Report called to 2M 

## 2014-03-26 NOTE — Progress Notes (Signed)
Pt called RN into room. C/o recent vomitting episode. Vomit surrounding pt appears brown, moderate amount. C/o "struggling to breath." Oxygen saturation 60s on RA. Pt was previously on [facility], but had removed. Nonrebreather applied. Oxygen saturation up to 80s. RRRN notified, at bedside.

## 2014-03-26 NOTE — Procedures (Signed)
Hemodialysis in progress. BP remains on low side, 79/28 asymptomatic.  oal is 1500cc. Douglas [name]

## 2014-03-26 NOTE — Progress Notes (Signed)
7:14 PM [facility] "X" Cover:- (Chart reviewed + today's progress notes reviewed)   Unfortunate 58 y/o ?, known Follic low gade NHL [[id number] Dr. Earlie Server, L groin hematoma  2000, initially admit 25-Feb-2014 for Vol overload, Hyper K req emergent HD by nephro-transferred to ICU for CRRT 2/2 to anuric AKI- etio?ATN vs Tumour lysis-he had Gen surg who placed  awound vac,Nephro  consuls and  HD Cath was placed for dialysis.  ID has been following for Abx choice as he grew Acinetobacter.  Ir attempted also to drain this area which was not amenable.  Today he started to vomit "brown stuff"  Nursing reports that he took his meds right after dialysis and then had vomiting episodes At bedside floor appear to have What appears to be blood on the floor.  Frail ill appearing male, some CR distress.  Nursing reports he was satting low 90's and visibly SOB NO wheeze.  Massively L>R side LE swelling, non tender Abd soft, denies abd pain and CP Just sob  ABG pH confirm acidosis 7.1 Likely multifactorial with Renal acidosis compenent but could also have resp component   P- Dr. Ree Kida has already contacted Dr. Earnest Conroy for them to assume care I think he might be having a GIB-I will order Zofran for now, Will start PPI infusion, and consult GI  He was seen in the recent past 12/20/13 and had a Duodenal bulb ulcer that was non-bleeding nby Dr. Benson Norway He might also need imaging of the abdomen.    Verneita Griffes, MD [facility] [phone]

## 2014-03-26 NOTE — Significant Event (Addendum)
Rapid Response Event Note  Overview: Time Called: 2025 Arrival Time: 1844 Event Type: Respiratory;Hypotension  Initial Focused Assessment:  Called by primary RN for patients oxygen saturation in the 70's and BP 62/20.  RN was called into room due to patient projectile vomiting brownish in color.  Patient is lethargic, oriented, pale.  POX 70's placed on 2lpm, no improvement oxygen increased to 6 then placed on NRB sats now 91-96%.  Patient denies pain, c/o SOB   Interventions:  250 cc NS Bolus given, BP 88/49, 79/50, 73/21. ABG ordered, MD paged.  results reviewed and given to MD 7.19, co2--57.2, o2 101, HCO3 21.  PCXR ordered.  MD at bedside.  Orders to transfer to ICU   Event Summary:   at      at          [facility]

## 2014-03-26 NOTE — Progress Notes (Signed)
[facility] Progress Note Patient Name: Remmy Riffe DOB: 04-Jun-1956 MRN: [id number]  Date of Service  03/26/2014   HPI/Events of Note  Patient with ongoing resp and metabolic acidosis with pH of 7.16/61/57/21   eICU Interventions  Plan: Increase TV and RR 2 amps of bicarb IVP Start Bicarb gtt Recheck ABG in  2 hours   Intervention Category Major Interventions: Acid-Base disturbance - evaluation and management  Guadelupe Sabin Azreal Stthomas 03/26/2014, 10:43 PM

## 2014-03-26 NOTE — Progress Notes (Signed)
ANTIBIOTIC CONSULT NOTE - FOLLOW UP  Pharmacy Consult for vancomycin/fortaz/tobramycin Indication: sepsis  No Known Allergies  Patient Measurements: Height: 5' 10.08" (178 cm) Weight: 501 lb 12.3 oz (227.6 kg) IBW/kg (Calculated) : 73.18  Vital Signs: Temp: 97.8 F (36.6 C) (05/08 1738) Temp src: Oral (05/08 1738) BP: 98/54 mmHg (05/08 1738) Pulse Rate: 108 (05/08 1738) Intake/Output from previous day: 05/07 0701 - 05/08 0700 In: 980 [P.O.:480; IV Piggyback:500] Out: -  Intake/Output from this shift:    Labs:  Recent Labs  03/24/14 0713 03/25/14 0608 03/26/14 0602  WBC 11.3*  --  9.6  HGB 9.3*  --  8.0*  PLT 84*  --  63*  CREATININE 4.81* 5.39* 5.80*   Estimated Creatinine Clearance: 27.2 ml/min (by C-G formula based on Cr of 5.8). No results found for this basename: VANCOTROUGH, VANCOPEAK, VANCORANDOM, GENTTROUGH, GENTPEAK, GENTRANDOM, TOBRATROUGH, TOBRAPEAK, TOBRARND, AMIKACINPEAK, AMIKACINTROU, AMIKACIN,  in the last 72 hours   Microbiology: Recent Results (from the past 720 hour(s))  WOUND CULTURE     Status: None   Collection Time    03/16/14 10:12 AM      Result Value Ref Range Status   Specimen Description WOUND LEG LEFT   Final   Special Requests NONE   Final   Gram Stain     Final   Value: RARE WBC PRESENT, PREDOMINANTLY PMN     NO SQUAMOUS EPITHELIAL CELLS SEEN     ABUNDANT GRAM NEGATIVE RODS     MODERATE GRAM POSITIVE COCCI     IN PAIRS   Culture     Final   Value: ABUNDANT ACINETOBACTER CALCOACETICUS/BAUMANNII COMPLEX     Note: COLISTIN SENSITIVE 0.38ug/mL TIGECYCLINE 2 ML/ug     ABUNDANT METHICILLIN RESISTANT STAPHYLOCOCCUS AUREUS     Note: RIFAMPIN AND GENTAMICIN SHOULD NOT BE USED AS SINGLE DRUGS FOR TREATMENT OF STAPH INFECTIONS. CRITICAL RESULT CALLED TO, READ BACK BY AND VERIFIED WITH: ASHLEY LARSON @ 9:58AM 03/21/14 BY DWEEKS     Performed at [facility]   Report Status 03/22/2014 FINAL   Final   Organism ID, Bacteria  ACINETOBACTER CALCOACETICUS/BAUMANNII COMPLEX   Final   Organism ID, Bacteria METHICILLIN RESISTANT STAPHYLOCOCCUS AUREUS   Final    Anti-infectives   Start     Dose/Rate Route Frequency Ordered Stop   02/26/2014 0953  ceFAZolin (ANCEF) 1-5 GM-% IVPB    Comments:  [name]   : cabinet override      03/18/2014 8295 03/12/2014 2159   03/14/2014 0953  ceFAZolin (ANCEF) 2-3 GM-% IVPB SOLR    Comments:  [name]   : cabinet override      03/05/2014 0953 02/26/2014 2159   02/24/2014 0600  [MAR Hold]  ceFAZolin (ANCEF) 3 g in dextrose 5 % 50 mL IVPB     (On MAR Hold since 03/06/2014 0850)   3 g 160 mL/hr over 30 Minutes Intravenous On call 03/07/14 1556 03/10/2014 1021   03/12/2014 0000  ceFAZolin (ANCEF) IVPB 1 g/50 mL premix  Status:  Discontinued    Comments:  Send with pt to OR   1 g 100 mL/hr over 30 Minutes Intravenous On call 03/07/14 1540 03/07/14 1555   03/01/14 1800  doxycycline (VIBRA-TABS) tablet 100 mg  Status:  Discontinued     100 mg Oral Every 12 hours 03/01/14 1522 03/10/14 1119   03/01/14 0800  ciprofloxacin (CIPRO) tablet 500 mg  Status:  Discontinued     500 mg Oral Daily with breakfast 02/28/14 1106  03/10/14 1119   02/26/14 0000  doxycycline (VIBRAMYCIN) 100 MG capsule     100 mg Oral 2 times daily 02/26/14 1153     02/26/14 0000  ciprofloxacin (CIPRO) 500 MG tablet     500 mg Oral 2 times daily 02/26/14 1153     02/25/14 1100  ciprofloxacin (CIPRO) IVPB 400 mg  Status:  Discontinued     400 mg 200 mL/hr over 60 Minutes Intravenous Every 12 hours 02/25/14 1047 02/28/14 1106   02/24/14 1830  doxycycline (VIBRAMYCIN) 100 mg in dextrose 5 % 250 mL IVPB  Status:  Discontinued     100 mg 125 mL/hr over 120 Minutes Intravenous Every 12 hours 02/24/14 1756 03/01/14 1521   02/24/14 1800  cefTRIAXone (ROCEPHIN) 1 g in dextrose 5 % 50 mL IVPB  Status:  Discontinued     1 g 100 mL/hr over 30 Minutes Intravenous Every 24 hours 02/24/14 1231 02/24/14 1756   02/24/14 1200  vancomycin  (VANCOCIN) 1,500 mg in sodium chloride 0.9 % 250 mL IVPB  Status:  Discontinued     1,500 mg 250 mL/hr over 60 Minutes Intravenous  Once 02/23/14 1423 02/24/14 1231   02/20/14 2200  vancomycin (VANCOCIN) 1,500 mg in sodium chloride 0.9 % 500 mL IVPB  Status:  Discontinued     1,500 mg 250 mL/hr over 120 Minutes Intravenous Every 24 hours 2014/03/15 2119 02/22/14 1116   March 15, 2014 2200  vancomycin (VANCOCIN) 2,000 mg in sodium chloride 0.9 % 500 mL IVPB     2,000 mg 250 mL/hr over 120 Minutes Intravenous  Once 03/15/2014 2119 02/20/14 0005      Assessment: 58 year old male with ESRD transferred to MICU for sepsis. Orders to start broad antibiotics with vancmycin, fortaz, and tobramycin. Patient received HD today, I suspect may need CVVH if bp still tenuous. Will continue to follow closely.  Will give 1 time doses tonight and follow up in am what plans are for renal replacement.  Goal of Therapy:  Vancomycin pre-hd level 15-25  Plan:  Fortaz 2g IV now  Vancomycin 2g IV now Tobramycin load of 2.5mg /kg based on adj body wt of 135kg - 340mg  tonight   Erin Hearing PharmD., BCPS Clinical Pharmacist Pager [phone] 03/26/2014 9:38 PM

## 2014-03-26 NOTE — Progress Notes (Signed)
PT Cancellation Note  Pt at HD, unavailable for PT. Will follow.   Lucile Crater PT 03/26/2014  [phone]

## 2014-03-26 NOTE — Progress Notes (Signed)
CRITICAL VALUE ALERT  Critical value received: Ph 7.19, CO2 57.2  Date of notification: 03/26/2014  Time of notification: 1900  Critical value read back:yes  Nurse who received alert:  Beulah Gandy, RN  MD notified (1st page):  Dr. Verlon Au   Time of first page:  At bedside 1902  MD notified (2nd page):  Time of second page:  Responding MD:   Time MD responded:

## 2014-03-27 DIAGNOSIS — R6521 Severe sepsis with septic shock: Secondary | ICD-10-CM

## 2014-03-27 DIAGNOSIS — A419 Sepsis, unspecified organism: Secondary | ICD-10-CM

## 2014-03-27 DIAGNOSIS — K920 Hematemesis: Secondary | ICD-10-CM

## 2014-03-27 DIAGNOSIS — J9600 Acute respiratory failure, unspecified whether with hypoxia or hypercapnia: Secondary | ICD-10-CM

## 2014-03-27 DIAGNOSIS — R6520 Severe sepsis without septic shock: Secondary | ICD-10-CM

## 2014-03-27 LAB — POCT I-STAT 3, ART BLOOD GAS (G3+)
ACID-BASE DEFICIT: 12 mmol/L — AB (ref 0.0–2.0)
ACID-BASE DEFICIT: 14 mmol/L — AB (ref 0.0–2.0)
Acid-Base Excess: 1 mmol/L (ref 0.0–2.0)
Acid-Base Excess: 4 mmol/L — ABNORMAL HIGH (ref 0.0–2.0)
Acid-base deficit: 14 mmol/L — ABNORMAL HIGH (ref 0.0–2.0)
Acid-base deficit: 4 mmol/L — ABNORMAL HIGH (ref 0.0–2.0)
BICARBONATE: 24.5 meq/L — AB (ref 20.0–24.0)
BICARBONATE: 27.9 meq/L — AB (ref 20.0–24.0)
Bicarbonate: 13.4 mEq/L — ABNORMAL LOW (ref 20.0–24.0)
Bicarbonate: 15.3 mEq/L — ABNORMAL LOW (ref 20.0–24.0)
Bicarbonate: 13.4 mEq/L — ABNORMAL LOW (ref 20.0–24.0)
Bicarbonate: 29.9 mEq/L — ABNORMAL HIGH (ref 20.0–24.0)
O2 Saturation: 83 %
O2 Saturation: 87 %
O2 Saturation: 84 %
O2 Saturation: 94 %
O2 Saturation: 96 %
O2 Saturation: 98 %
PCO2 ART: 49.4 mmHg — AB (ref 35.0–45.0)
PH ART: 7.221 — AB (ref 7.350–7.450)
PH ART: 7.389 (ref 7.350–7.450)
PO2 ART: 65 mmHg — AB (ref 80.0–100.0)
PO2 ART: 87 mmHg (ref 80.0–100.0)
TCO2: 15 mmol/L (ref 0–100)
TCO2: 17 mmol/L (ref 0–100)
TCO2: 14 mmol/L (ref 0–100)
TCO2: 26 mmol/L (ref 0–100)
TCO2: 29 mmol/L (ref 0–100)
TCO2: 31 mmol/L (ref 0–100)
pCO2 arterial: 36.2 mmHg (ref 35.0–45.0)
pCO2 arterial: 41.5 mmHg (ref 35.0–45.0)
pCO2 arterial: 37 mmHg (ref 35.0–45.0)
pCO2 arterial: 59.7 mmHg (ref 35.0–45.0)
pCO2 arterial: 50.3 mmHg — ABNORMAL HIGH (ref 35.0–45.0)
pH, Arterial: 7.175 — CL (ref 7.350–7.450)
pH, Arterial: 7.177 — CL (ref 7.350–7.450)
pH, Arterial: 7.166 — CL (ref 7.350–7.450)
pH, Arterial: 7.349 — ABNORMAL LOW (ref 7.350–7.450)
pO2, Arterial: 59 mmHg — ABNORMAL LOW (ref 80.0–100.0)
pO2, Arterial: 61 mmHg — ABNORMAL LOW (ref 80.0–100.0)
pO2, Arterial: 88 mmHg (ref 80.0–100.0)
pO2, Arterial: 105 mmHg — ABNORMAL HIGH (ref 80.0–100.0)

## 2014-03-27 LAB — CBC
HCT: 32.8 % — ABNORMAL LOW (ref 39.0–52.0)
HEMOGLOBIN: 10.5 g/dL — AB (ref 13.0–17.0)
MCH: 28.8 pg (ref 26.0–34.0)
MCHC: 32 g/dL (ref 30.0–36.0)
MCV: 89.9 fL (ref 78.0–100.0)
Platelets: 56 10*3/uL — ABNORMAL LOW (ref 150–400)
RBC: 3.65 MIL/uL — AB (ref 4.22–5.81)
RDW: 19.5 % — ABNORMAL HIGH (ref 11.5–15.5)
WBC: 6.8 10*3/uL (ref 4.0–10.5)

## 2014-03-27 LAB — BLOOD GAS, ARTERIAL
ACID-BASE DEFICIT: 5.5 mmol/L — AB (ref 0.0–2.0)
ACID-BASE DEFICIT: 5.8 mmol/L — AB (ref 0.0–2.0)
Bicarbonate: 20.6 mEq/L (ref 20.0–24.0)
Bicarbonate: 20.4 mEq/L (ref 20.0–24.0)
DRAWN BY: 36496
DRAWN BY: 36496
FIO2: 1 %
FIO2: 1 %
LHR: 30 {breaths}/min
MECHVT: 510 mL
O2 SAT: 88.5 %
O2 Saturation: 91.3 %
PATIENT TEMPERATURE: 97.7
PCO2 ART: 48.2 mmHg — AB (ref 35.0–45.0)
PCO2 ART: 48.5 mmHg — AB (ref 35.0–45.0)
PEEP: 10 cmH2O
PEEP: 10 cmH2O
PO2 ART: 58.4 mmHg — AB (ref 80.0–100.0)
Patient temperature: 97.7
RATE: 30 resp/min
TCO2: 22.1 mmol/L (ref 0–100)
TCO2: 22 mmol/L (ref 0–100)
VT: 510 mL
pH, Arterial: 7.251 — ABNORMAL LOW (ref 7.350–7.450)
pH, Arterial: 7.244 — ABNORMAL LOW (ref 7.350–7.450)
pO2, Arterial: 64.1 mmHg — ABNORMAL LOW (ref 80.0–100.0)

## 2014-03-27 LAB — BASIC METABOLIC PANEL
BUN: 32 mg/dL — ABNORMAL HIGH (ref 6–23)
CALCIUM: 7 mg/dL — AB (ref 8.4–10.5)
CO2: 20 meq/L (ref 19–32)
Chloride: 94 mEq/L — ABNORMAL LOW (ref 96–112)
Creatinine, Ser: 3.68 mg/dL — ABNORMAL HIGH (ref 0.50–1.35)
GFR calc Af Amer: 20 mL/min — ABNORMAL LOW (ref >90)
GFR, EST NON AFRICAN AMERICAN: 17 mL/min — AB (ref >90)
GLUCOSE: 211 mg/dL — AB (ref 70–99)
POTASSIUM: 3.8 meq/L (ref 3.7–5.3)
Sodium: 135 mEq/L — ABNORMAL LOW (ref 137–147)

## 2014-03-27 LAB — GLUCOSE, CAPILLARY
GLUCOSE-CAPILLARY: 229 mg/dL — AB (ref 70–99)
Glucose-Capillary: 143 mg/dL — ABNORMAL HIGH (ref 70–99)
Glucose-Capillary: 152 mg/dL — ABNORMAL HIGH (ref 70–99)
Glucose-Capillary: 178 mg/dL — ABNORMAL HIGH (ref 70–99)
Glucose-Capillary: 213 mg/dL — ABNORMAL HIGH (ref 70–99)

## 2014-03-27 LAB — TYPE AND SCREEN
ABO/RH(D): A POS
ANTIBODY SCREEN: NEGATIVE
UNIT DIVISION: 0
UNIT DIVISION: 0

## 2014-03-27 LAB — RENAL FUNCTION PANEL
ALBUMIN: 1.5 g/dL — AB (ref 3.5–5.2)
BUN: 29 mg/dL — AB (ref 6–23)
CO2: 21 mEq/L (ref 19–32)
Calcium: 6.9 mg/dL — ABNORMAL LOW (ref 8.4–10.5)
Chloride: 90 mEq/L — ABNORMAL LOW (ref 96–112)
Creatinine, Ser: 3.27 mg/dL — ABNORMAL HIGH (ref 0.50–1.35)
GFR, EST AFRICAN AMERICAN: 23 mL/min — AB (ref >90)
GFR, EST NON AFRICAN AMERICAN: 20 mL/min — AB (ref >90)
Glucose, Bld: 212 mg/dL — ABNORMAL HIGH (ref 70–99)
PHOSPHORUS: 6 mg/dL — AB (ref 2.3–4.6)
POTASSIUM: 3.7 meq/L (ref 3.7–5.3)
SODIUM: 134 meq/L — AB (ref 137–147)

## 2014-03-27 MED ORDER — PRISMASOL BGK 4/2.5 32-4-2.5 MEQ/L IV SOLN
INTRAVENOUS | Status: DC
  Administered 2014-03-27: 09:00:00 via INTRAVENOUS_CENTRAL
  Filled 2014-03-27: qty 5000

## 2014-03-27 MED ORDER — PANTOPRAZOLE SODIUM 40 MG IV SOLR
40.0000 mg | Freq: Two times a day (BID) | INTRAVENOUS | Status: DC
  Administered 2014-03-27 – 2014-03-29 (×4): 40 mg via INTRAVENOUS
  Filled 2014-03-27 (×5): qty 40

## 2014-03-27 MED ORDER — SODIUM CHLORIDE 0.9 % FOR CRRT
INTRAVENOUS_CENTRAL | Status: DC | PRN
  Filled 2014-03-27: qty 1000

## 2014-03-27 MED ORDER — SODIUM CHLORIDE 0.9 % IV SOLN
2000.0000 mg | INTRAVENOUS | Status: DC
  Administered 2014-03-27 – 2014-03-29 (×3): 2000 mg via INTRAVENOUS
  Filled 2014-03-27 (×3): qty 2000

## 2014-03-27 MED ORDER — PRISMASOL BGK 4/2.5 32-4-2.5 MEQ/L IV SOLN
INTRAVENOUS | Status: DC
  Administered 2014-03-27 – 2014-03-29 (×6): via INTRAVENOUS_CENTRAL
  Filled 2014-03-27 (×15): qty 5000

## 2014-03-27 MED ORDER — SODIUM BICARBONATE 8.4 % IV SOLN: INTRAVENOUS | Status: DC

## 2014-03-27 MED ORDER — DEXTROSE 5 % IV SOLN
2.0000 g | Freq: Two times a day (BID) | INTRAVENOUS | Status: DC
  Administered 2014-03-27 – 2014-03-29 (×5): 2 g via INTRAVENOUS
  Filled 2014-03-27 (×6): qty 2

## 2014-03-27 MED ORDER — STERILE WATER FOR INJECTION IV SOLN
INTRAVENOUS | Status: DC
  Administered 2014-03-27 – 2014-03-28 (×7): via INTRAVENOUS_CENTRAL
  Filled 2014-03-27 (×10): qty 150

## 2014-03-27 MED ORDER — SODIUM BICARBONATE 8.4 % IV SOLN
INTRAVENOUS | Status: AC
  Filled 2014-03-27: qty 50

## 2014-03-27 MED ORDER — HYDROCORTISONE SOD SUCCINATE 100 MG IJ SOLR
50.0000 mg | Freq: Four times a day (QID) | INTRAMUSCULAR | Status: DC
  Administered 2014-03-27 – 2014-03-29 (×9): 50 mg via INTRAVENOUS
  Filled 2014-03-27 (×11): qty 1

## 2014-03-27 MED ORDER — SODIUM CHLORIDE 0.9 % IV SOLN
2000.0000 mg | INTRAVENOUS | Status: DC
  Filled 2014-03-27: qty 2000

## 2014-03-27 MED ORDER — HEPARIN SODIUM (PORCINE) 1000 UNIT/ML DIALYSIS
1000.0000 [IU] | INTRAMUSCULAR | Status: DC | PRN
  Filled 2014-03-27: qty 6

## 2014-03-27 MED ORDER — SODIUM BICARBONATE 8.4 % IV SOLN
50.0000 meq | Freq: Once | INTRAVENOUS | Status: AC
  Administered 2014-03-27: 50 meq via INTRAVENOUS
  Filled 2014-03-27: qty 50

## 2014-03-27 MED ORDER — SODIUM CHLORIDE 0.9 % IV SOLN
0.0300 [IU]/min | INTRAVENOUS | Status: DC
  Administered 2014-03-27 – 2014-03-28 (×2): 0.03 [IU]/min via INTRAVENOUS
  Filled 2014-03-27 (×3): qty 2.5

## 2014-03-27 MED ORDER — TOBRAMYCIN SULFATE 80 MG/2ML IJ SOLN
340.0000 mg | INTRAVENOUS | Status: DC
  Filled 2014-03-27: qty 8.5

## 2014-03-27 MED ORDER — STERILE WATER FOR INJECTION IV SOLN
INTRAVENOUS | Status: DC
  Administered 2014-03-27 – 2014-03-28 (×5): via INTRAVENOUS_CENTRAL
  Filled 2014-03-27 (×11): qty 150

## 2014-03-27 NOTE — Consult Note (Signed)
Patient seen, examined, and I agree with the above documentation, including the assessment and plan. Critically ill with shock, likely septic in nature. Cannot exclude the possibility of upper GI bleed, though it does not appear to be active at present. I personally performed orogastric tube lavage 300 cc with return of dark emesis without red or maroon blood Now on continuous renal replacement therapy, full pressor and ventilatory support Given history of duodenal ulcer, continue PPI May consider EGD, depending on clinical course

## 2014-03-27 NOTE — Consult Note (Signed)
[facility] Consult: 8:15 AM 03/27/2014  LOS: 36 days    Referring Provider: Dr Verlon Au  Primary Care Physician:  [name], MD Primary Gastroenterologist:  Dr. Benson Norway     Reason for Consultation: hypotension in setting of dark emesis   HPI: Kaiven Vester is a morbidly obese, diabetic 58 y.o. male.  Hx low grade NHL with inguinal lymphadenopathy. Node biopsy in 10/2013.  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  3/17 /3/20 admit with AKI. Now on CRRT.    Rectal bleed 12/2013. EGD per Dr Benson Norway 12/24/2013: non-bleeding ulcer duodenal bulb.  Colonoscopy:  Sessile sigmoid polyp, lipoma in left colon, int/ext hemorrhoids. H pylori Ab was 0.52/negative, polyp was hyperplastic. Home meds include BID Omeprazole 20 mg and po Iron BID.   Admitted 4/3 with shock, hypercarbic resp failure (intubated), lactic acidosis. After dialysis yesterday became hypotensive, began vomiting what looked like blood, O2 sats in 90s and dyspneic, may have aspirated.  Intubated and placed on vent, transferred to ICU  Hgb as low as 7.2 on 03/22/14. Had been in 7s to 8s (even as low as 6.8) since February but 12 to 13 in past years. Has received transfusions on previous admissions this year.  Hgb this AM is 10.5. He has been transfused with 5 PRBCs so far this admission, 2 last night.  Now on CVVH. On  Levo, Neo for pressure support.  On PPI drip. Platelets are low at 56, had been in low 100s in recent weeks. Coags normal. BUN not higher than previous.     Past Medical History  Diagnosis Date  . Non Hodgkin's lymphoma January 2009  . Diabetes mellitus without complication   . Hypertension   . Sleep apnea   . Lymphadenopathy, inguinal     left    Past Surgical History  Procedure Laterality Date  . Biopsy for  lymphoma Left 11/02/13    groin  . Colonoscopy N/A 12/22/2013    Procedure: COLONOSCOPY;  Surgeon: Beryle Beams, MD;  Location: [facility];  Service: Endoscopy;  Laterality: N/A;  . Esophagogastroduodenoscopy N/A 12/22/2013    Procedure: ESOPHAGOGASTRODUODENOSCOPY (EGD);  Surgeon: Beryle Beams, MD;  Location: [facility];  Service: Endoscopy;  Laterality: N/A;  . Ercp N/A 12/24/2013    Procedure: UPPER ENDOSCOPY;  Surgeon: Beryle Beams, MD;  Location: [facility];  Service: Gastroenterology;  Laterality: N/A;  . Colonoscopy N/A 12/24/2013    Procedure: COLONOSCOPY;  Surgeon: Beryle Beams, MD;  Location: [facility];  Service: Gastroenterology;  Laterality: N/A;  . Insertion of dialysis catheter Right 03/07/2014    Procedure: Removal of catheter,  INSERTION OF DIALYSIS CATHETER right IJ;  Surgeon: Conrad [place], MD;  Location: [facility];  Service: Vascular;  Laterality: Right;    Prior to Admission medications   Medication Sig Start Date End Date Taking? Authorizing Provider  Alogliptin-Pioglitazone (OSENI) 25-15 MG TABS Take 1 tablet by mouth daily.   Yes Historical Provider, MD  docusate sodium (COLACE) 100 MG capsule Take 100 mg by mouth 2 (two) times daily.  Yes Historical Provider, MD  Ferrous Sulfate (IRON) 325 (65 FE) MG TABS Take 325 mg by mouth 2 (two) times daily. 12/26/13  Yes Adeline Saralyn Pilar, MD  Hydrocortisone-Aloe Vera (CORTIZONE-10 INTENSIVE HEALING) 1 % CREA Apply 1 application topically as needed (For itching).   Yes Historical Provider, MD  omeprazole (PRILOSEC) 40 MG capsule Take 1 capsule (40 mg total) by mouth 2 (two) times daily. 12/26/13  Yes Adeline Saralyn Pilar, MD  polyethylene glycol (MIRALAX / GLYCOLAX) packet Take 17 g by mouth daily as needed for mild constipation.    Yes Historical Provider, MD  prochlorperazine (COMPAZINE) 10 MG tablet Take 1 tablet (10 mg total) by mouth every 6 (six) hours as needed for nausea or vomiting. 12/01/13  Yes [name], MD  ciprofloxacin (CIPRO)  500 MG tablet Take 1 tablet (500 mg total) by mouth 2 (two) times daily. 1 week 02/26/14   Thurnell Lose, MD  doxycycline (VIBRAMYCIN) 100 MG capsule Take 1 capsule (100 mg total) by mouth 2 (two) times daily. 1 week 02/26/14   Thurnell Lose, MD  insulin aspart (NOVOLOG) 100 UNIT/ML injection Before each meal 3 times a day, 140-199 - 2 units, 200-250 - 4 units, 251-299 - 6 units,  300-349 - 8 units,  350 or above 10 units. Dispense syringes and needles as needed, Ok to switch to PEN if approved. 02/26/14   Thurnell Lose, MD  oxyCODONE-acetaminophen (PERCOCET) 10-325 MG per tablet Take 1 tablet by mouth every 6 (six) hours as needed for pain. 02/26/14   Thurnell Lose, MD    Scheduled Meds: . antiseptic oral rinse  1 application Mouth Rinse QID  . artificial tears  1 application Both Eyes 3 times per day  . chlorhexidine  15 mL Mouth/Throat BID  . feeding supplement (NEPRO CARB STEADY)  237 mL Oral BID BM  . insulin aspart  0-9 Units Subcutaneous TID WC  . lactulose  30 g Oral BID  . midodrine  10 mg Oral TID WC  . multivitamin  1 tablet Oral QHS  . [START ON Apr 26, 2014] pantoprazole (PROTONIX) IV  40 mg Intravenous Q12H  . sodium chloride  3 mL Intravenous Q12H   Infusions: . cisatracurium (NIMBEX) infusion 3 mcg/kg/min (03/27/14 [id number])  . fentaNYL infusion INTRAVENOUS 300 mcg/hr (03/27/14 0609)  . midazolam (VERSED) infusion 4 mg/hr (03/27/14 0200)  . norepinephrine (LEVOPHED) Adult infusion 30 mcg/min (03/27/14 0400)  . pantoprozole (PROTONIX) infusion 8 mg/hr (03/27/14 0752)  . phenylephrine (NEO-SYNEPHRINE) Adult infusion 200 mcg/min (03/27/14 0434)  . dialysis replacement fluid (prismasate)    . dialysis replacement fluid (prismasate)    . dialysate (PRISMASATE)    .  sodium bicarbonate  infusion 1000 mL 75 mL/hr at 03/26/14 2300   PRN Meds: diphenhydrAMINE, guaiFENesin-dextromethorphan, heparin, heparin, sodium chloride, sodium chloride, sodium chloride   Allergies as of  03/18/2014  . (No Known Allergies)    Family History  Problem Relation Age of Onset  . CAD Mother 45    Died at age 84  . CAD Father     History   Social History  . Marital Status: Single    Spouse Name: N/A    Number of Children: N/A  . Years of Education: N/A   Occupational History  . Not on file.   Social History Main Topics  . Smoking status: Never Smoker   . Smokeless tobacco: Never Used  . Alcohol Use: No  . Drug Use: No  . Sexual Activity: Not  on file   Other Topics Concern  . 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    PHYSICAL EXAM: Vital signs in last 24 hours: Filed Vitals:   03/27/14 0752  BP:   Pulse:   Temp: 98.7 F (37.1 C)  Resp:    Wt Readings from Last 3 Encounters:  03/26/14 227.6 kg (501 lb 12.3 oz)  03/26/14 227.6 kg (501 lb 12.3 oz)  02/05/14 183.435 kg (404 lb 6.4 oz)    General: morbidly obese, critically ill, on multiple drips, intubated Head:  + obese face, no asymmetry  Eyes:  No icterus, no pallor Ears:  Unable to assess hearing  Nose:  No discharge Mouth:  OGT and ETT in place.  No bloody discharge Neck:  obese Lungs:  Clear in front. Unlabored, vented respirations Heart: RRR.  No MRG Abdomen:  Soft, hugely obese, no sores, no masses.  Left groin wound bandaged and foul smelling.   Rectal: not done.  No stool per RN   Musc/Skeltl: no joint contractures  Extremities:  Feet cool, generally obese limbs but no pitting in feet Neurologic:  unresponsive Skin:  Dry,  Tattoos:  None seen Nodes:  Not assessed   Psych:  Not able to assess.   Intake/Output from previous day: 05/08 0701 - 05/09 0700 In: 2788.7 [I.V.:1598.7; Blood:690; IV Piggyback:500] Out: 2360 [Emesis/NG output:1580] Intake/Output this shift:    LAB RESULTS:  Recent Labs  03/26/14 0602 03/26/14 2200  03/27/14 0400  WBC 9.6  --  6.8  HGB 8.0* 9.5* 10.5*  HCT 25.4* 29.7* 32.8*  PLT 63*  --  56*   BMET Lab Results  Component Value Date   NA 135* 03/27/2014   NA 136* 03/26/2014   NA 135* 03/26/2014   K 3.8 03/27/2014   K 4.0 03/26/2014   K 4.7 03/26/2014   CL 94* 03/27/2014   CL 95* 03/26/2014   CL 92* 03/26/2014   CO2 20 03/27/2014   CO2 21 03/26/2014   CO2 22 03/26/2014   GLUCOSE 211* 03/27/2014   GLUCOSE 156* 03/26/2014   GLUCOSE 155* 03/26/2014   BUN 32* 03/27/2014   BUN 31* 03/26/2014   BUN 57* 03/26/2014   CREATININE 3.68* 03/27/2014   CREATININE 3.61* 03/26/2014   CREATININE 5.80* 03/26/2014   CALCIUM 7.0* 03/27/2014   CALCIUM 7.2* 03/26/2014   CALCIUM 7.6* 03/26/2014   LFT  Recent Labs  03/25/14 0608 03/26/14 2200  PROT  --  5.1*  ALBUMIN 1.7* 1.8*  AST  --  123*  ALT  --  14  ALKPHOS  --  169*  BILITOT  --  0.7   PT/INR Lab Results  Component Value Date   INR 1.02 03/24/2014   INR 1.19 02/20/2014   INR 1.21 03/04/2014    RADIOLOGY STUDIES: Dg Chest Port 1 View  03/26/2014   CLINICAL DATA:  Acute respiratory failure.  Intubation.  EXAM: PORTABLE CHEST - 1 VIEW  COMPARISON:  03/26/2014  FINDINGS: Endotracheal tube has been placed with tip approximately 3.5 cm above carina. Bilateral jugular central venous catheters remain in place. A new nasogastric tube is seen entering the stomach.  Low lung volumes again seen. Bibasilar airspace disease shows mild interval worsening in right lung base. Heart size is stable. No pneumothorax identified.  IMPRESSION: New endotracheal tube and nasogastric tube in  appropriate position.  Bibasilar airspace disease, with mild worsening noted in right lung base.   Electronically Signed   By: Earle Gell M.D.   On: 03/26/2014 21:59   Dg Chest Port 1 View  03/26/2014   CLINICAL DATA:  Hypoxia.  EXAM: PORTABLE CHEST - 1 VIEW  COMPARISON:  03/24/2014.  FINDINGS: Left IJ central line and right IJ dialysis catheter appear unchanged. Low lung volumes. Right perihilar airspace  disease. No pneumothorax. Basilar atelectasis. Retrocardiac density has increased compared to prior, likely representing atelectasis and airspace disease in combination.  Compared to the recent prior, the airspace disease has increased, along with decreasing lung volumes.  IMPRESSION: 1. Stable support apparatus. 2. Lower lung volumes with increasing bilateral perihilar predominant airspace disease favored to represent volume overload and pulmonary edema. Pneumonia is considered less likely.   Electronically Signed   By: Dereck Ligas M.D.   On: 03/26/2014 20:55    ENDOSCOPIC STUDIES: See HPI  IMPRESSION:   *  hypotension and dark/bloody emesis last night.  Volume overload on CXR.  May have aspirated.  On PPI drip. Intubated 5/8 GIB 12/2013.  Colon/EGD then with HP polyp, colonic lipoma, h pylori negative and non -bleeding duodenal ulcer.  Home meds include BID PPI.  *  Chronic anemia.  On po Iron at home.  Transfusions x 5 this admission and on previous admissions.  *  Thrombocytopenia.   *  ESRD.  HD dependent.  Currently on CRRT  *  Non healing, infected left inguinal would    PLAN:     *  Observe for GI hemorrhage, possible EGD tomorrow.  Continue PPI drip.    Charlynne Cousins Roselin Wiemann  03/27/2014, 8:15 AM Pager: [phone]

## 2014-03-27 NOTE — Progress Notes (Signed)
All ABG results shown to Richardson Landry Minor, NP.

## 2014-03-27 NOTE — Progress Notes (Signed)
ANTIBIOTIC CONSULT NOTE - FOLLOW UP  Pharmacy Consult for vancomycin/fortaz/tobramycin Indication: sepsis  No Known Allergies  Patient Measurements: Height: 5' 10.08" (178 cm) Weight: 501 lb 12.3 oz (227.6 kg) IBW/kg (Calculated) : 73.18 ABW = 119 kg  Vital Signs: Temp: 98.2 F (36.8 C) (05/09 1213) Temp src: Oral (05/09 1213) BP: 83/49 mmHg (05/09 0800) Pulse Rate: 117 (05/09 1200) Intake/Output from previous day: 05/08 0701 - 05/09 0700 In: 2788.7 [I.V.:1598.7; Blood:690; IV Piggyback:500] Out: 2360 [Emesis/NG output:1580] Intake/Output from this shift: Total I/O In: 806.8 [I.V.:806.8] Out: 549 [Other:549]  Labs:  Recent Labs  03/26/14 0602 03/26/14 2200 03/27/14 0400  WBC 9.6  --  6.8  HGB 8.0* 9.5* 10.5*  PLT 63*  --  56*  CREATININE 5.80* 3.61* 3.68*   Estimated Creatinine Clearance: 42.8 ml/min (by C-G formula based on Cr of 3.68). No results found for this basename: VANCOTROUGH, VANCOPEAK, VANCORANDOM, GENTTROUGH, GENTPEAK, GENTRANDOM, TOBRATROUGH, TOBRAPEAK, TOBRARND, AMIKACINPEAK, AMIKACINTROU, AMIKACIN,  in the last 72 hours   Microbiology: Recent Results (from the past 720 hour(s))  WOUND CULTURE     Status: None   Collection Time    03/16/14 10:12 AM      Result Value Ref Range Status   Specimen Description WOUND LEG LEFT   Final   Special Requests NONE   Final   Gram Stain     Final   Value: RARE WBC PRESENT, PREDOMINANTLY PMN     NO SQUAMOUS EPITHELIAL CELLS SEEN     ABUNDANT GRAM NEGATIVE RODS     MODERATE GRAM POSITIVE COCCI     IN PAIRS   Culture     Final   Value: ABUNDANT ACINETOBACTER CALCOACETICUS/BAUMANNII COMPLEX     Note: COLISTIN SENSITIVE 0.38ug/mL TIGECYCLINE 2 ML/ug     ABUNDANT METHICILLIN RESISTANT STAPHYLOCOCCUS AUREUS     Note: RIFAMPIN AND GENTAMICIN SHOULD NOT BE USED AS SINGLE DRUGS FOR TREATMENT OF STAPH INFECTIONS. CRITICAL RESULT CALLED TO, READ BACK BY AND VERIFIED WITH: ASHLEY LARSON @ 9:58AM 03/21/14 BY DWEEKS   Performed at [facility]   Report Status 03/22/2014 FINAL   Final   Organism ID, Bacteria ACINETOBACTER CALCOACETICUS/BAUMANNII COMPLEX   Final   Organism ID, Bacteria METHICILLIN RESISTANT STAPHYLOCOCCUS AUREUS   Final    Anti-infectives   Start     Dose/Rate Route Frequency Ordered Stop   03/27/14 0000  tobramycin (NEBCIN) 340 mg in dextrose 5 % 50 mL IVPB     2.5 mg/kg  135 kg (Adjusted) 117 mL/hr over 30 Minutes Intravenous  Once 03/26/14 2137 03/27/14 0030   03/26/14 2300  vancomycin (VANCOCIN) 2,000 mg in sodium chloride 0.9 % 500 mL IVPB     2,000 mg 250 mL/hr over 120 Minutes Intravenous  Once 03/26/14 2137 03/27/14 0045   03/26/14 2145  cefTAZidime (FORTAZ) 2 g in dextrose 5 % 50 mL IVPB     2 g 100 mL/hr over 30 Minutes Intravenous NOW 03/26/14 2137 03/26/14 2234   02/24/2014 0953  ceFAZolin (ANCEF) 1-5 GM-% IVPB    Comments:  [name]   : cabinet override      03/14/2014 8469 03/04/2014 2159   02/26/2014 0953  ceFAZolin (ANCEF) 2-3 GM-% IVPB SOLR    Comments:  [name]   : cabinet override      03/14/2014 0953 03/17/2014 2159   02/26/2014 0600  [MAR Hold]  ceFAZolin (ANCEF) 3 g in dextrose 5 % 50 mL IVPB     (On MAR Hold since 02/25/2014  0850)   3 g 160 mL/hr over 30 Minutes Intravenous On call 03/07/14 1556 02/22/2014 1021   03/07/2014 0000  ceFAZolin (ANCEF) IVPB 1 g/50 mL premix  Status:  Discontinued    Comments:  Send with pt to OR   1 g 100 mL/hr over 30 Minutes Intravenous On call 03/07/14 1540 03/07/14 1555   03/01/14 1800  doxycycline (VIBRA-TABS) tablet 100 mg  Status:  Discontinued     100 mg Oral Every 12 hours 03/01/14 1522 03/10/14 1119   03/01/14 0800  ciprofloxacin (CIPRO) tablet 500 mg  Status:  Discontinued     500 mg Oral Daily with breakfast 02/28/14 1106 03/10/14 1119   02/26/14 0000  doxycycline (VIBRAMYCIN) 100 MG capsule     100 mg Oral 2 times daily 02/26/14 1153     02/26/14 0000  ciprofloxacin (CIPRO) 500 MG tablet     500 mg Oral 2 times  daily 02/26/14 1153     02/25/14 1100  ciprofloxacin (CIPRO) IVPB 400 mg  Status:  Discontinued     400 mg 200 mL/hr over 60 Minutes Intravenous Every 12 hours 02/25/14 1047 02/28/14 1106   02/24/14 1830  doxycycline (VIBRAMYCIN) 100 mg in dextrose 5 % 250 mL IVPB  Status:  Discontinued     100 mg 125 mL/hr over 120 Minutes Intravenous Every 12 hours 02/24/14 1756 03/01/14 1521   02/24/14 1800  cefTRIAXone (ROCEPHIN) 1 g in dextrose 5 % 50 mL IVPB  Status:  Discontinued     1 g 100 mL/hr over 30 Minutes Intravenous Every 24 hours 02/24/14 1231 02/24/14 1756   02/24/14 1200  vancomycin (VANCOCIN) 1,500 mg in sodium chloride 0.9 % 250 mL IVPB  Status:  Discontinued     1,500 mg 250 mL/hr over 60 Minutes Intravenous  Once 02/23/14 1423 02/24/14 1231   02/20/14 2200  vancomycin (VANCOCIN) 1,500 mg in sodium chloride 0.9 % 500 mL IVPB  Status:  Discontinued     1,500 mg 250 mL/hr over 120 Minutes Intravenous Every 24 hours 02/24/2014 2119 02/22/14 1116   03/05/2014 2200  vancomycin (VANCOCIN) 2,000 mg in sodium chloride 0.9 % 500 mL IVPB     2,000 mg 250 mL/hr over 120 Minutes Intravenous  Once 03/04/2014 2119 02/20/14 0005      Assessment: 58 yo M to ED on 4/3 with elevated K (7.6), SOB x 2 wks and AKI. Morbidly obese. Pre-existing wound to L groin area (home health changes dressings). Swelling to BLE.  Receiving chemo with bendamustine (Treanda) and Rituxan q28d - Dr. Julien Nordmann. Last chemo 3/18/15Pharmacy consulted to start broad antibiotics with vancmycin, fortaz, and tobramycin.   PMH: NHL, HTN, DM, sleep apnea  Events: started CVV HD this am, pm 5/8 transferred to MICU hypoxic and vomiting brown stuff.   Anticoag: Hep for PE initially - now stopped d/t low suspicion; now SQ hep. LE Dopplers neg.   ID: L-groin draining wound non-healing s/p biopsy (grew MRSA + Ecoli on 02/03/14).  Afeb, pct 2.42, LA 4.3 Plan for wound vac placement. CXR 4/4 no acute abnormality  4/3 Wound >> mult, none  predominant 4/3 MRSA PCR + 4/3 Bld >> cancelled 4/5 BCx2>> 4/28 wound -acinetobacter  Cardiovascular: HTN, HLD, hypotensive-  SBP 90-80s, HR 110-120s,  NE @ 50, Neo @ 200  Endocrinology: DM, SSI, glucose up to 250  Gastrointestinal / Nutrition: GIB,  Protonix ggt  Nephrology: on CVVHD since 8am 5.9 Pulmonary: Dypsnea- CXR w/ R-pulmonary nodules (f/u outpt), Oncology: Lymphoma- Oncology does  not think this is TLS due to no chemo in last 6 weeks; h/o GIB  PTA Medication Issues: Oseni, Lipitor, Colace, Iron, Miralax Best Practices: sq heparin  Goal of Therapy:  Vancomycin pre-hd level 15-25  Plan:  Fortaz 2g IV q12h Vancomycin 2g IV q24h Tobramycin 350 q24h   Thank you for allowing pharmacy to be a part of this patients care team.  Rowe Robert Pharm.D., BCPS, AQ-Cardiology Clinical Pharmacist 03/27/2014 12:19 PM Pager: [phone] Phone: [phone]

## 2014-03-27 NOTE — Procedures (Signed)
Intubation Procedure Note Douglas [name] [id number] 1956-03-08  Procedure: Intubation Indications: Respiratory insufficiency  Procedure Details Consent: Unable to obtain consent because of altered level of consciousness. Time Out: Verified patient identification, verified procedure, site/side was marked, verified correct patient position, special equipment/implants available, medications/allergies/relevent history reviewed, required imaging and test results available.  Performed  Medications used: etomidate and succinylcholine Equipment used: Glidescope Grade I View Correct placement confirmed by by auscultation, by CXR and ETCO2 monitor Tube secured at 26 cm at the lip  Evaluation Hemodynamic Status: BP stable throughout; O2 sats: stable throughout Patient's Current Condition: stable Complications: No apparent complications Patient did tolerate procedure well. Chest X-ray ordered to verify placement.  CXR: tube position acceptable.   Waynetta Pean, M.D. Pulmonary and Critical Care Medicine Call [place] with questions [phone] 03/27/2014

## 2014-03-27 NOTE — Progress Notes (Signed)
PULMONARY / CRITICAL CARE MEDICINE  Name: Douglas [name] MRN: [id number] DOB: 09-25-1956    ADMISSION DATE:  03/13/14 CONSULTATION DATE:  13-Mar-2014  REFERRING MD : [facility]  PRIMARY SERVICE: PCCM  CHIEF COMPLAINT: Hypotension, respiratory failure  BRIEF PATIENT DESCRIPTION: 58 yo with low-grade non-Hodgkin's lymphoma diagnosed in 2009 s/p recent chemotherapy and admission for AKI 3/17-3/20 who presented on 4/3 as a transfer from [place] with 1.5 week history of shortness of breath found to have acute renal failure requiring emergent CRRT. On 5/8 developed acute encephalopathy, hypotension, hypoxemia and hypercarbia requiring intubation and vasopressors.   SIGNIFICANT EVENTS / STUDIES:   LINES / TUBES: R IJ HD cath 4/20 >>>  CULTURES: 4/28  Wound  >>> MRSA and ACINETOBACTER MDR   ANTIBIOTICS: Ceftazidime 5/8 >>> Vancomycin 5/8 >>> Tobramycin 5/8 >>>  VITAL SIGNS: Temp:  [97.4 F (36.3 C)-99.4 F (37.4 C)] 98.2 F (36.8 C) (05/09 1213) Pulse Rate:  [97-138] 131 (05/09 1400) Resp:  [20-34] 32 (05/09 1400) BP: (67-127)/(16-68) 83/49 mmHg (05/09 0800) SpO2:  [88 %-100 %] 100 % (05/09 1400) Arterial Line BP: (74-123)/(32-54) 86/48 mmHg (05/09 1400) FiO2 (%):  [70 %-100 %] 70 % (05/09 1111) Weight:  [227.6 kg (501 lb 12.3 oz)] 227.6 kg (501 lb 12.3 oz) (05/08 1738)  HEMODYNAMICS:   VENTILATOR SETTINGS: Vent Mode:  [-] PRVC FiO2 (%):  [70 %-100 %] 70 % Set Rate:  [28 bmp-32 bmp] 32 bmp Vt Set:  [440 mL-590 mL] 440 mL PEEP:  [5 cmH20-14 cmH20] 14 cmH20 Plateau Pressure:  [32 cmH20-35 cmH20] 33 cmH20  INTAKE / OUTPUT: Intake/Output     05/08 0701 - 05/09 0700 05/09 0701 - 05/10 0700   P.O.     I.V. (mL/kg) 1598.7 (7) 1496.7 (6.6)   Blood 690    IV Piggyback 500    Total Intake(mL/kg) 2788.7 (12.3) 1496.7 (6.6)   Emesis/NG output 1580    Other 780 1546   Total Output 2360 1546   Net +428.7 -49.3          PHYSICAL EXAMINATION: General: Mechanically ventilated,  synchronous Neuro: Sedated / paralyzed  HEENT:  Moist membranes Cardiovascular:  Regular, no murmurs Lungs: Bilateral diminished air entry, rales Abdomen: Soft distended abdomen, no bowel sounds Musculoskeletal:  BLE edema Skin:  Wound L groin, R groin bleeding  LABS: CBC  Recent Labs Lab 03/24/14 0713 03/26/14 0602 03/26/14 2200 03/27/14 0400  WBC 11.3* 9.6  --  6.8  HGB 9.3* 8.0* 9.5* 10.5*  HCT 28.5* 25.4* 29.7* 32.8*  PLT 84* 63*  --  56*   Coag's  Recent Labs Lab 03/24/14 0832  INR 1.02   BMET  Recent Labs Lab 03/26/14 0602 03/26/14 2200 03/27/14 0400  NA 135* 136* 135*  K 4.7 4.0 3.8  CL 92* 95* 94*  CO2 22 21 20   BUN 57* 31* 32*  CREATININE 5.80* 3.61* 3.68*  GLUCOSE 155* 156* 211*   Electrolytes  Recent Labs Lab 03/23/14 1246 03/24/14 0713 03/25/14 0608 03/26/14 0602 03/26/14 2200 03/27/14 0400  CALCIUM 7.8* 8.2* 7.7* 7.6* 7.2* 7.0*  PHOS 5.2* 6.0* 7.0*  --   --   --    Sepsis Markers  Recent Labs Lab 03/26/14 2200  LATICACIDVEN 4.0*   ABG  Recent Labs Lab 03/27/14 0840 03/27/14 0927 03/27/14 1013  PHART 7.177* 7.175* 7.166*  PCO2ART 36.2 41.5 37.0  PO2ART 65.0* 59.0* 61.0*   Liver Enzymes  Recent Labs Lab 03/24/14 0713 03/25/14 0608 03/26/14 2200  AST  --   --  123*  ALT  --   --  14  ALKPHOS  --   --  169*  BILITOT  --   --  0.7  ALBUMIN 1.5* 1.7* 1.8*   Cardiac Enzymes No results found for this basename: TROPONINI, PROBNP,  in the last 168 hours  Glucose  Recent Labs Lab 03/25/14 2111 03/26/14 0809 03/26/14 1221 03/27/14 0020 03/27/14 0727 03/27/14 1122  GLUCAP 124* 138* 157* 152* 229* 213*   IMAGING: Dg Chest Port 1 View  03/26/2014   CLINICAL DATA:  Acute respiratory failure.  Intubation.  EXAM: PORTABLE CHEST - 1 VIEW  COMPARISON:  03/26/2014  FINDINGS: Endotracheal tube has been placed with tip approximately 3.5 cm above carina. Bilateral jugular central venous catheters remain in place. A new  nasogastric tube is seen entering the stomach.  Low lung volumes again seen. Bibasilar airspace disease shows mild interval worsening in right lung base. Heart size is stable. No pneumothorax identified.  IMPRESSION: New endotracheal tube and nasogastric tube in appropriate position.  Bibasilar airspace disease, with mild worsening noted in right lung base.   Electronically Signed   By: Earle Gell M.D.   On: 03/26/2014 21:59   Dg Chest Port 1 View  03/26/2014   CLINICAL DATA:  Hypoxia.  EXAM: PORTABLE CHEST - 1 VIEW  COMPARISON:  03/24/2014.  FINDINGS: Left IJ central line and right IJ dialysis catheter appear unchanged. Low lung volumes. Right perihilar airspace disease. No pneumothorax. Basilar atelectasis. Retrocardiac density has increased compared to prior, likely representing atelectasis and airspace disease in combination.  Compared to the recent prior, the airspace disease has increased, along with decreasing lung volumes.  IMPRESSION: 1. Stable support apparatus. 2. Lower lung volumes with increasing bilateral perihilar predominant airspace disease favored to represent volume overload and pulmonary edema. Pneumonia is considered less likely.   Electronically Signed   By: Dereck Ligas M.D.   On: 03/26/2014 20:55   ASSESSMENT / PLAN:  PULMONARY A: Acute respiratory failure Probable aspiration pneumonia ARDS Pulmonary edema May have high PLP due to ovbesity P:   Goal pH>7.20, SpO2>88, PLP<30  Continuous mechanical support, high Ve ( may need to increase to 7 cc/kg) VAP bundle Defer SBT Trend ABG/CXR  CARDIOVASCULAR A:  Septic shock P:  Goal MAP > 60 Levophed gtt Neo-Synephrine gtt Add Vasopressin  RENAL A:   ESRD Metabolic acidosis P:   Nephrology CRRT Bicarbonate gtt@100   GASTROINTESTINAL A:   Dark bloody secretions, unlikely significant GI hemorrhage GI following  P:   D/c Protonix gtt Protonix bid NPO  HEMATOLOGIC A:   Anemia Thrombocytopenia VTE Px P:   Trend CBC SCD  INFECTIOUS A:   Groin wound infection MRSA and Acinetobacter Aspiration pneumonia P:   Abx as above  ENDOCRINE A:   Diabetes Unknown adrenal function Morbid obesity P:   SSI Cortisol Hydrocortisone, stress dose  NEUROLOGIC A:   Acute encephalopathy P:   Fentanyl / Versed / Nimbex  I have personally obtained history, examined patient, evaluated and interpreted laboratory and imaging results, reviewed medical records, formulated assessment / plan and placed orders.  CRITICAL CARE:  The patient is critically ill with multiple organ systems failure and requires high complexity decision making for assessment and support, frequent evaluation and titration of therapies, application of advanced monitoring technologies and extensive interpretation of multiple databases. Critical Care Time devoted to patient care services described in this note is 45 minutes.   Doree Fudge, MD Pulmonary and [place] Pager: [phone]  [phone]  03/27/2014, 3:04 PM

## 2014-03-27 NOTE — Progress Notes (Signed)
PULMONARY / CRITICAL CARE MEDICINE   Name: Douglas [name] MRN: [id number] DOB: Jan 13, 1956    ADMISSION DATE:  03/06/2014 CONSULTATION DATE:  03/06/14  REFERRING MD : Daleen Bo  PRIMARY SERVICE: TRH -> PCCM  CHIEF COMPLAINT: Hypotension, respiratory failure.  BRIEF PATIENT DESCRIPTION:   58 yo M with pmh of follicular low-grade non-Hodgkin's lymphoma with large left groin lymphadenopathy diagnosed in 2009 with recent chemotherapy and admission for AKI 3/17-3/20 who presented as a transfer from [place] with 1.5 week history of shortness of breath found to have acute renal failure requiring emergent CRRT.  Today developed AMS, hypotension, hypoxemia and hypercarbia requiring intubation and vasopressors.   SIGNIFICANT EVENTS / STUDIES:  4/3 ARF requiring emergent CRRT.4/4, Renal US negative.  4/4 ARF thought to be due to ATN from severe sepsis, per oncology not tumor lysis syndrome. No DVT, heparin gtt stopped.  4/5 Continue CRRT, to transition to HD, no renal recovery yet. Hyperuricemia resolved. Dialysis access is a right IJ dialysis catheter  4/6 .Wound vac placed  4/7 -hemodialysis initiated  ------------ 5/6 hypotension 5/8 Hypotension, respiratory acidosis, hypoxemia. Intubation.   LINES / TUBES: HD RIJ catheter 4/3 >4/20 HD RIJ permacath 4/20 >>>  CULTURES: MRSA PCR 4/3 Postive Wound 4/3> neg BC 4/5>>neg Wound 4/28 >>> Abundant MRSA and Acinetobacter- MDR - both sensitive to [place]  ANTIBIOTICS: - Ceftazidime 03/26/14 - Vancomycin 03/26/14 - Tobramycin 03/26/14  VITAL SIGNS: Temp:  [97.4 F (36.3 C)-98.4 F (36.9 C)] 97.7 F (36.5 C) (05/09 0021) Pulse Rate:  [97-121] 121 (05/09 0041) Resp:  [16-34] 30 (05/09 0041) BP: (70-127)/(28-68) 127/51 mmHg (05/09 0041) SpO2:  [90 %-100 %] 97 % (05/09 0041) Arterial Line BP: (85-123)/(32-51) 123/51 mmHg (05/09 0000) FiO2 (%):  [100 %] 100 % (05/09 0041) Weight:  [501 lb 12.3 oz (227.6 kg)-502 lb 10.4 oz (228 kg)] 501 lb  12.3 oz (227.6 kg) (05/08 1738) HEMODYNAMICS:   VENTILATOR SETTINGS: Vent Mode:  [-] PRVC FiO2 (%):  [100 %] 100 % Set Rate:  [28 bmp-30 bmp] 30 bmp Vt Set:  [510 mL-590 mL] 510 mL PEEP:  [5 cmH20-10 cmH20] 10 cmH20 Plateau Pressure:  [32 cmH20-35 cmH20] 34 cmH20 INTAKE / OUTPUT: Intake/Output     05/08 0701 - 05/09 0700   I.V. (mL/kg) 404.1 (1.8)   Blood 690   IV Piggyback 500   Total Intake(mL/kg) 1594.1 (7)   Other 780   Total Output 780   Net +814.1         PHYSICAL EXAMINATION: General: AMS, moderate respiratory distress, morbidly obese Neuro: Moving extremities, normal strength, somnolent HEENT:  Normocephalic  Cardiovascular:  Normal rate and rhythm, distant heart sounds Lungs: Bilateral crackles. Abdomen: lower abdominal tenderness, normal BS, no guarding Musculoskeletal:  BLE edema Skin:  Wound to L groin, R groin bleeding  LABS:  CBC  Recent Labs Lab 03/23/14 1511 03/24/14 0713 03/26/14 0602 03/26/14 2200  HGB 9.2* 9.3* 8.0* 9.5*  HCT 27.7* 28.5* 25.4* 29.7*  WBC 12.1* 11.3* 9.6  --   PLT 98* 84* 63*  --     COAGULATION  Recent Labs Lab 03/24/14 0832  INR 1.02    CARDIAC   No results found for this basename: TROPONINI,  in the last 168 hours No results found for this basename: PROBNP,  in the last 168 hours   CHEMISTRY  Recent Labs Lab 03/21/14 1200 03/22/14 0617 03/23/14 1246 03/24/14 0713 03/25/14 0608 03/26/14 0602 03/26/14 2200  NA 132* 131* 131* 135* 134* 135* 136*  K 4.2 4.9 5.0 4.3 4.4 4.7 4.0  CL 85* 87* 91* 91* 92* 92* 95*  CO2 19 19 19 21 21 22 21   GLUCOSE 140* 128* 132* 150* 149* 155* 156*  BUN 44* 53* 39* 45* 50* 57* 31*  CREATININE 4.76* 5.24* 3.99* 4.81* 5.39* 5.80* 3.61*  CALCIUM 8.8 8.9 7.8* 8.2* 7.7* 7.6* 7.2*  PHOS 6.0* 6.8* 5.2* 6.0* 7.0*  --   --    Estimated Creatinine Clearance: 43.6 ml/min (by C-G formula based on Cr of 3.61).   LIVER  Recent Labs Lab 03/22/14 0617 03/23/14 1246 03/24/14 0713  03/24/14 0832 03/25/14 0608 03/26/14 2200  AST  --   --   --   --   --  123*  ALT  --   --   --   --   --  14  ALKPHOS  --   --   --   --   --  169*  BILITOT  --   --   --   --   --  0.7  PROT  --   --   --   --   --  5.1*  ALBUMIN 1.5* 1.4* 1.5*  --  1.7* 1.8*  INR  --   --   --  1.02  --   --      INFECTIOUS  Recent Labs Lab 03/26/14 2200  LATICACIDVEN 4.0*     ENDOCRINE CBG (last 3)   Recent Labs  03/26/14 0809 03/26/14 1221 03/27/14 0020  GLUCAP 138* 157* 152*      ASSESSMENT / PLAN:  PULMONARY A: 1) Hypoxemic and hypercarbic respiratory failure 2) Likely secondary to aspiration pneumonia. Abundant purulent secretions from ETT after intubation.  P:   - Mechanical ventilation (lung protective ventilation per ARDS protocol)   - PRVC, Vt: 6cc/kg, PEEP: 10, RR: 30, FiO2: 100% and adjust to keep O2 sat > 92% or PaO2 >55   - VAP prevention order set   - Daily awakening and SBT if indicated - Duonebs - Antibiotics on ID section - High peak and plateau pressures despite adequate sedation. Will start Nimbex drip.  CARDIOVASCULAR A:  1) Hypotension, septic shock 2) Lactic acidosis P:  - Levophed to keep MAP > 65 - Follow lactate  RENAL A:   1) ESRD P:   - Nephrology to follow in am, may need CRRT  GASTROINTESTINAL A:   1) Dark bloody secretions from NGT P:   - IV protonix - Consider GI consult in am if persistent   HEMATOLOGIC A:   1) Anemia, likely of chronic disease vs GI bleed P:  - SCD's for DVT prophylaxis  INFECTIOUS A:   1) Groin wound: MRSA and Acinetobacter sensitive to tobra / gent 2) Aspiration pneumonia P:   - Ceftazidime - Vancomycin - Tobramycin - Will follow cultures  ENDOCRINE A:   1) Diabetes 20 Morbid obesity P:   - ICU hyperglycemia protocol with Novolog SS  NEUROLOGIC A:   1) Intubated, sedated paralysed P:   - Versed - Fentanyl - Nimbex drip    I have personally obtained a history, examined the  patient, evaluated laboratory and imaging results, formulated the assessment and plan and placed orders. CRITICAL CARE: The patient is critically ill with multiple organ systems failure and requires high complexity decision making for assessment and support, frequent evaluation and titration of therapies, application of advanced monitoring technologies and extensive interpretation of multiple databases. Critical Care Time devoted to patient care  services described in this note is 60 minutes.   Waynetta Pean, MD Pulmonary and [place] Pager: [phone]  03/27/2014, 1:33 AM      [place]. Pager: [phone]. After hours pager: [phone].

## 2014-03-27 NOTE — Progress Notes (Signed)
ABG shown to Richardson Landry Minor, NP.

## 2014-03-27 NOTE — Progress Notes (Signed)
Assessment  32M with AKI(creat 0.9 on 01/18/14) in setting of ARB, nonhealing inguinal wound, and s/p treatment for NHL.  1. AKI, normal baseline GFR, dialysis dependent. 2. NHL recent therapy 3. Nonhealing L inguinal wound draining 4. Hypotension 5. Anemia, s/p PRBCs 6. Diabetes Mellitus 7. VDRF/pulm edema/Shock  Plan: Dialysis will be a challenge but will attempt CRRT for ventilatory support given o2 requirements and CXR  Subjective: Interval History: respiratory failure, intubated overnight and on pressors Objective: Vital signs in last 24 hours: Temp:  [97.4 F (36.3 C)-99.4 F (37.4 C)] 99.4 F (37.4 C) (05/09 0410) Pulse Rate:  [97-123] 122 (05/09 0500) Resp:  [16-34] 30 (05/09 0500) BP: (67-127)/(16-68) 92/42 mmHg (05/09 0438) SpO2:  [88 %-100 %] 88 % (05/09 0500) Arterial Line BP: (85-123)/(32-54) 103/52 mmHg (05/09 0500) FiO2 (%):  [90 %-100 %] 90 % (05/09 0503) Weight:  [227.6 kg (501 lb 12.3 oz)-228 kg (502 lb 10.4 oz)] 227.6 kg (501 lb 12.3 oz) (05/08 1738) Weight change: -0.6 kg (-1 lb 5.2 oz)  Intake/Output from previous day: 05/08 0701 - 05/09 0700 In: 2788.7 [I.V.:1598.7; Blood:690; IV Piggyback:500] Out: 2360 [Emesis/NG output:1580] Intake/Output this shift: Total I/O In: 2098.7 [I.V.:1598.7; IV Piggyback:500] Out: 1580 [Emesis/NG output:1580]  General appearance: intubated Morbidly obese unresponsive  Lab Results:  Recent Labs  03/26/14 0602 03/26/14 2200 03/27/14 0400  WBC 9.6  --  6.8  HGB 8.0* 9.5* 10.5*  HCT 25.4* 29.7* 32.8*  PLT 63*  --  56*   BMET:  Recent Labs  03/26/14 2200 03/27/14 0400  NA 136* 135*  K 4.0 3.8  CL 95* 94*  CO2 21 20  GLUCOSE 156* 211*  BUN 31* 32*  CREATININE 3.61* 3.68*  CALCIUM 7.2* 7.0*   No results found for this basename: PTH,  in the last 72 hours Iron Studies: No results found for this basename: IRON, TIBC, TRANSFERRIN, FERRITIN,  in the last 72 hours Studies/Results: Dg Chest Port 1  View  03/26/2014   CLINICAL DATA:  Acute respiratory failure.  Intubation.  EXAM: PORTABLE CHEST - 1 VIEW  COMPARISON:  03/26/2014  FINDINGS: Endotracheal tube has been placed with tip approximately 3.5 cm above carina. Bilateral jugular central venous catheters remain in place. A new nasogastric tube is seen entering the stomach.  Low lung volumes again seen. Bibasilar airspace disease shows mild interval worsening in right lung base. Heart size is stable. No pneumothorax identified.  IMPRESSION: New endotracheal tube and nasogastric tube in appropriate position.  Bibasilar airspace disease, with mild worsening noted in right lung base.   Electronically Signed   By: Earle Gell M.D.   On: 03/26/2014 21:59   Dg Chest Port 1 View  03/26/2014   CLINICAL DATA:  Hypoxia.  EXAM: PORTABLE CHEST - 1 VIEW  COMPARISON:  03/24/2014.  FINDINGS: Left IJ central line and right IJ dialysis catheter appear unchanged. Low lung volumes. Right perihilar airspace disease. No pneumothorax. Basilar atelectasis. Retrocardiac density has increased compared to prior, likely representing atelectasis and airspace disease in combination.  Compared to the recent prior, the airspace disease has increased, along with decreasing lung volumes.  IMPRESSION: 1. Stable support apparatus. 2. Lower lung volumes with increasing bilateral perihilar predominant airspace disease favored to represent volume overload and pulmonary edema. Pneumonia is considered less likely.   Electronically Signed   By: Dereck Ligas M.D.   On: 03/26/2014 20:55   Scheduled: . antiseptic oral rinse  1 application Mouth Rinse QID  . artificial tears  1 application Both Eyes 3 times per day  . chlorhexidine  15 mL Mouth/Throat BID  . feeding supplement (NEPRO CARB STEADY)  237 mL Oral BID BM  . insulin aspart  0-9 Units Subcutaneous TID WC  . lactulose  30 g Oral BID  . midodrine  10 mg Oral TID WC  . multivitamin  1 tablet Oral QHS  . ondansetron      . [START  ON 03/20/2014] pantoprazole (PROTONIX) IV  40 mg Intravenous Q12H  . sodium chloride  3 mL Intravenous Q12H     LOS: 36 days   Douglas [name] 03/27/2014,6:43 AM

## 2014-03-28 DIAGNOSIS — J9600 Acute respiratory failure, unspecified whether with hypoxia or hypercapnia: Secondary | ICD-10-CM

## 2014-03-28 LAB — POCT I-STAT 3, ART BLOOD GAS (G3+)
Acid-Base Excess: 6 mmol/L — ABNORMAL HIGH (ref 0.0–2.0)
Acid-Base Excess: 4 mmol/L — ABNORMAL HIGH (ref 0.0–2.0)
BICARBONATE: 29.4 meq/L — AB (ref 20.0–24.0)
Bicarbonate: 31.7 mEq/L — ABNORMAL HIGH (ref 20.0–24.0)
O2 Saturation: 96 %
O2 Saturation: 98 %
PCO2 ART: 46 mmHg — AB (ref 35.0–45.0)
Patient temperature: 97.5
TCO2: 33 mmol/L (ref 0–100)
TCO2: 31 mmol/L (ref 0–100)
pCO2 arterial: 43.3 mmHg (ref 35.0–45.0)
pH, Arterial: 7.444 (ref 7.350–7.450)
pH, Arterial: 7.439 (ref 7.350–7.450)
pO2, Arterial: 77 mmHg — ABNORMAL LOW (ref 80.0–100.0)
pO2, Arterial: 97 mmHg (ref 80.0–100.0)

## 2014-03-28 LAB — RENAL FUNCTION PANEL
ALBUMIN: 1.4 g/dL — AB (ref 3.5–5.2)
Albumin: 1.3 g/dL — ABNORMAL LOW (ref 3.5–5.2)
BUN: 28 mg/dL — ABNORMAL HIGH (ref 6–23)
BUN: 24 mg/dL — ABNORMAL HIGH (ref 6–23)
CALCIUM: 6.1 mg/dL — AB (ref 8.4–10.5)
CO2: 27 mEq/L (ref 19–32)
CO2: 21 mEq/L (ref 19–32)
Calcium: 6.1 mg/dL — CL (ref 8.4–10.5)
Chloride: 88 mEq/L — ABNORMAL LOW (ref 96–112)
Chloride: 90 mEq/L — ABNORMAL LOW (ref 96–112)
Creatinine, Ser: 2.92 mg/dL — ABNORMAL HIGH (ref 0.50–1.35)
Creatinine, Ser: 2.54 mg/dL — ABNORMAL HIGH (ref 0.50–1.35)
GFR calc non Af Amer: 23 mL/min — ABNORMAL LOW (ref >90)
GFR calc non Af Amer: 27 mL/min — ABNORMAL LOW (ref >90)
GFR, EST AFRICAN AMERICAN: 26 mL/min — AB (ref >90)
GFR, EST AFRICAN AMERICAN: 31 mL/min — AB (ref >90)
GLUCOSE: 142 mg/dL — AB (ref 70–99)
Glucose, Bld: 155 mg/dL — ABNORMAL HIGH (ref 70–99)
Phosphorus: 4.9 mg/dL — ABNORMAL HIGH (ref 2.3–4.6)
Phosphorus: 4.9 mg/dL — ABNORMAL HIGH (ref 2.3–4.6)
Potassium: 3.7 mEq/L (ref 3.7–5.3)
Potassium: 4.3 mEq/L (ref 3.7–5.3)
SODIUM: 136 meq/L — AB (ref 137–147)
SODIUM: 135 meq/L — AB (ref 137–147)

## 2014-03-28 LAB — GLUCOSE, CAPILLARY
GLUCOSE-CAPILLARY: 124 mg/dL — AB (ref 70–99)
GLUCOSE-CAPILLARY: 133 mg/dL — AB (ref 70–99)
GLUCOSE-CAPILLARY: 133 mg/dL — AB (ref 70–99)
Glucose-Capillary: 140 mg/dL — ABNORMAL HIGH (ref 70–99)
Glucose-Capillary: 103 mg/dL — ABNORMAL HIGH (ref 70–99)
Glucose-Capillary: 122 mg/dL — ABNORMAL HIGH (ref 70–99)

## 2014-03-28 LAB — MAGNESIUM: Magnesium: 2 mg/dL (ref 1.5–2.5)

## 2014-03-28 LAB — CORTISOL: CORTISOL PLASMA: 47.7 ug/dL

## 2014-03-28 LAB — LACTIC ACID, PLASMA: Lactic Acid, Venous: 6.1 mmol/L — ABNORMAL HIGH (ref 0.5–2.2)

## 2014-03-28 LAB — TOBRAMYCIN LEVEL, RANDOM: Tobramycin Rm: 2.5 ug/mL

## 2014-03-28 MED ORDER — INSULIN ASPART 100 UNIT/ML ~~LOC~~ SOLN
0.0000 [IU] | SUBCUTANEOUS | Status: DC
  Administered 2014-03-28 – 2014-03-29 (×8): 2 [IU] via SUBCUTANEOUS

## 2014-03-28 MED ORDER — SODIUM CHLORIDE 0.9 % IV SOLN
1.0000 g | Freq: Once | INTRAVENOUS | Status: AC
  Administered 2014-03-28: 1 g via INTRAVENOUS
  Filled 2014-03-28: qty 10

## 2014-03-28 MED ORDER — PRISMASOL BGK 4/2.5 32-4-2.5 MEQ/L IV SOLN
INTRAVENOUS | Status: DC
  Administered 2014-03-28: 05:00:00 via INTRAVENOUS_CENTRAL
  Filled 2014-03-28 (×4): qty 5000

## 2014-03-28 MED ORDER — PRISMASOL BGK 4/2.5 32-4-2.5 MEQ/L IV SOLN
INTRAVENOUS | Status: DC
Start: 2014-03-28 — End: 2014-03-29
  Administered 2014-03-28 (×2): via INTRAVENOUS_CENTRAL
  Filled 2014-03-28 (×4): qty 5000

## 2014-03-28 MED ORDER — DEXTROSE 5 % IV SOLN
340.0000 mg | INTRAVENOUS | Status: DC
Start: 2014-03-29 — End: 2014-03-28

## 2014-03-28 MED ORDER — TOBRAMYCIN SULFATE 80 MG/2ML IJ SOLN
360.0000 mg | INTRAVENOUS | Status: DC
  Administered 2014-03-28: 360 mg via INTRAVENOUS
  Filled 2014-03-28: qty 9

## 2014-03-28 NOTE — Progress Notes (Signed)
Patient seen, examined, and I agree with the above documentation, including the assessment and plan. Septic shock from presumed aspiration PNA, on pressors and vent support Feculent appearing NG aspirate, but no evidence of overt GI bleeding at this time. RN reports stools not bloody No plan for EGD today, unless actively bleeding Remains critically ill on CVVHD, pressors and vent BID IV PPI

## 2014-03-28 NOTE — Progress Notes (Signed)
Douglas [name] is a 58 y.o. male who has been consulted for aminoglycoside dosing.  The patient has a current serum creatinine of  Creatinine (mg/dL)  Date Value  01/19/2014 0.9      Creatinine, Ser (mg/dL)  Date Value  03/27/2014 3.27*   which calculates to an estimated CrCl of Estimated Creatinine Clearance: 48.2 ml/min (by C-G formula based on Cr of 3.27).  Current weight is 227.6 (ABW 120kg)  The patient was started on tobramycin at a dose of 3 mg/kg/day per dosing Nomogram and adjusted body weight.  Pt was started on CRRT 5/9 am; tobra trough 5/9 pm is above goal at 2.5.  Will change tobramycin dosing to 360mg  IV Q48H.  Patient will be followed by pharmacy for changes in renal function, toxicity, and efficacy.  Serum creatinines will be ordered as needed.  Wynona Neat, PharmD, BCPS 03/28/2014 12:19 AM

## 2014-03-28 NOTE — Progress Notes (Signed)
Daily Rounding Note  03/28/2014, 8:31 AM  LOS: 37 days   SUBJECTIVE:       Remains on levophed, neo.  Also on multiple sedative meds. CVVH continues.  NGT with feculent looking, brown emesis. Output of NGT yesterday was recorded at 400 ml.  Stool yesterday was not bloody per RN.   OBJECTIVE:         Vital signs in last 24 hours:    Temp:  [97.5 F (36.4 C)-98.5 F (36.9 C)] 97.8 F (36.6 C) (05/10 0715) Pulse Rate:  [84-132] 84 (05/10 0815) Resp:  [18-35] 35 (05/10 0815) BP: (102-111)/(53-60) 105/53 mmHg (05/10 0333) SpO2:  [92 %-100 %] 98 % (05/10 0815) Arterial Line BP: (74-122)/(43-68) 105/55 mmHg (05/10 0815) FiO2 (%):  [50 %-70 %] 50 % (05/10 0600) Weight:  [232.1 kg (511 lb 11 oz)] 232.1 kg (511 lb 11 oz) (05/10 0500) Last BM Date: 03/24/14 General: remains critically ill looking on multiple drips and intubated.   Heart: RRR Chest: clear bil.  Abdomen: obese, somewhat tense, no tenderness, hypoactive BS Extremities: anasarca,  GU: scrotal edema Neuro/Psych:  Unresponsive on vent.   Intake/Output from previous day: 05/09 0701 - 05/10 0700 In: 4670.3 [I.V.:3880.3; NG/GT:80; IV Piggyback:710] Out: 6038 [Emesis/NG output:650]  Intake/Output this shift: Total I/O In: 60.8 [I.V.:60.8] Out: 178 [Emesis/NG output:40; Other:138]  Lab Results:  Recent Labs  03/26/14 0602 03/26/14 2200 03/27/14 0400  WBC 9.6  --  6.8  HGB 8.0* 9.5* 10.5*  HCT 25.4* 29.7* 32.8*  PLT 63*  --  56*   BMET  Recent Labs  03/27/14 0400 03/27/14 1540 03/28/14 0405  NA 135* 134* 136*  K 3.8 3.7 3.7  CL 94* 90* 88*  CO2 20 21 27   GLUCOSE 211* 212* 142*  BUN 32* 29* 28*  CREATININE 3.68* 3.27* 2.92*  CALCIUM 7.0* 6.9* 6.1*   LFT  Recent Labs  03/26/14 2200 03/27/14 1540 03/28/14 0405  PROT 5.1*  --   --   ALBUMIN 1.8* 1.5* 1.4*  AST 123*  --   --   ALT 14  --   --   ALKPHOS 169*  --   --   BILITOT 0.7  --    --     Studies/Results: Dg Chest Port 1 View  03/26/2014   CLINICAL DATA:  Acute respiratory failure.  Intubation.  EXAM: PORTABLE CHEST - 1 VIEW  COMPARISON:  03/26/2014  FINDINGS: Endotracheal tube has been placed with tip approximately 3.5 cm above carina. Bilateral jugular central venous catheters remain in place. A new nasogastric tube is seen entering the stomach.  Low lung volumes again seen. Bibasilar airspace disease shows mild interval worsening in right lung base. Heart size is stable. No pneumothorax identified.  IMPRESSION: New endotracheal tube and nasogastric tube in appropriate position.  Bibasilar airspace disease, with mild worsening noted in right lung base.   Electronically Signed   By: Earle Gell M.D.   On: 03/26/2014 21:59   Dg Chest Port 1 View  03/26/2014   CLINICAL DATA:  Hypoxia.  EXAM: PORTABLE CHEST - 1 VIEW  COMPARISON:  03/24/2014.  FINDINGS: Left IJ central line and right IJ dialysis catheter appear unchanged. Low lung volumes. Right perihilar airspace disease. No pneumothorax. Basilar atelectasis. Retrocardiac density has increased compared to prior, likely representing atelectasis and airspace disease in combination.  Compared to the recent prior, the airspace disease has increased, along with decreasing lung volumes.  IMPRESSION:  1. Stable support apparatus. 2. Lower lung volumes with increasing bilateral perihilar predominant airspace disease favored to represent volume overload and pulmonary edema. Pneumonia is considered less likely.   Electronically Signed   By: Dereck Ligas M.D.   On: 03/26/2014 20:55    ASSESMENT:   * Dark emesis. ? Aspiration event GIB 12/2013. Colon/EGD then with HP polyp, colonic lipoma, h pylori negative and non -bleeding duodenal ulcer. Home meds include BID PPI.  *  Anemia. ABL on chronic.  S/p 5 PRBCs since 4/23 2 of these on 5/8.  *  resp failure.  Intubated 5/8. Pulm edema.  * Chronic anemia. On po Iron at home. Transfusions x 5 this  admission and on previous admissions.  * Thrombocytopenia.  * ESRD. HD dependent. Currently on CRRT  * NHL with non healing, infected left inguinal wound at site of node biopsy.    PLAN   *  Dr Benson Norway will return 5/11 to assume GI care. Continue PPI drip and monitoring of blood count.  *  Continue BID IV Protonix   Vena Rua  03/28/2014, 8:31 AM Pager: [phone]

## 2014-03-28 NOTE — Progress Notes (Signed)
PULMONARY / CRITICAL CARE MEDICINE  Name: Douglas [name] MRN: [id number] DOB: 11/02/1956    ADMISSION DATE:  03/11/2014 CONSULTATION DATE:  02/23/2014  REFERRING MD : [facility]  PRIMARY SERVICE: PCCM  CHIEF COMPLAINT: Hypotension, respiratory failure  BRIEF PATIENT DESCRIPTION: 58 yo with low-grade non-Hodgkin's lymphoma s/p recent chemotherapy and admission for AKI 3/17-3/20 who presented on 4/3 as a transfer from [facility] hospital with 1.5 week history of shortness of breath found to have acute renal failure requiring emergent CRRT. On 5/8 developed acute encephalopathy, hypotension, hypoxemia and hypercarbia requiring intubation and vasopressors.   SIGNIFICANT EVENTS / STUDIES:   LINES / TUBES: R IJ HD cath 4/20 >>>  CULTURES: 4/28  Wound  >>> MRSA and ACINETOBACTER MDR   ANTIBIOTICS: Ceftazidime 5/8 >>> Vancomycin 5/8 >>> Tobramycin 5/8 >>>  INTERVAL HISTORY:  Vasopressor / oxygen requirements decreased.  VITAL SIGNS: Temp:  [97.5 F (36.4 C)-98.5 F (36.9 C)] 97.8 F (36.6 C) (05/10 0715) Pulse Rate:  [81-132] 81 (05/10 1130) Resp:  [18-35] 35 (05/10 1130) BP: (102-111)/(53-60) 105/53 mmHg (05/10 0333) SpO2:  [96 %-100 %] 98 % (05/10 1130) Arterial Line BP: (74-144)/(43-82) 144/82 mmHg (05/10 1130) FiO2 (%):  [50 %-60 %] 50 % (05/10 0818) Weight:  [232.1 kg (511 lb 11 oz)] 232.1 kg (511 lb 11 oz) (05/10 0500)  HEMODYNAMICS:   VENTILATOR SETTINGS: Vent Mode:  [-] PRVC FiO2 (%):  [50 %-60 %] 50 % Set Rate:  [35 bmp] 35 bmp Vt Set:  [440 mL] 440 mL PEEP:  [8 cmH20-10 cmH20] 8 cmH20 Plateau Pressure:  [25 cmH20-27 cmH20] 27 cmH20  INTAKE / OUTPUT: Intake/Output     05/09 0701 - 05/10 0700 05/10 0701 - 05/11 0700   I.V. (mL/kg) 3880.3 (16.7) 270.2 (1.2)   Blood     NG/GT 80    IV Piggyback 710    Total Intake(mL/kg) 4670.3 (20.1) 270.2 (1.2)   Emesis/NG output 650 40   Other 5388 505   Total Output 6038 545   Net -1367.7 -274.8         PHYSICAL  EXAMINATION: General: No nistress Neuro: Sedated / paralyzed  HEENT:  Moist membranes Cardiovascular:  Regular, no murmurs Lungs: Rhonchi bilaterally Abdomen: Soft distended abdomen, no bowel sounds Musculoskeletal:  BLE edema Skin:  Wound L groin, R groin bleeding  LABS: CBC  Recent Labs Lab 03/24/14 0713 03/26/14 0602 03/26/14 2200 03/27/14 0400  WBC 11.3* 9.6  --  6.8  HGB 9.3* 8.0* 9.5* 10.5*  HCT 28.5* 25.4* 29.7* 32.8*  PLT 84* 63*  --  56*   Coag's  Recent Labs Lab 03/24/14 0832  INR 1.02   BMET  Recent Labs Lab 03/27/14 0400 03/27/14 1540 03/28/14 0405  NA 135* 134* 136*  K 3.8 3.7 3.7  CL 94* 90* 88*  CO2 20 21 27   BUN 32* 29* 28*  CREATININE 3.68* 3.27* 2.92*  GLUCOSE 211* 212* 142*   Electrolytes  Recent Labs Lab 03/25/14 0608  03/27/14 0400 03/27/14 1540 03/28/14 0405  CALCIUM 7.7*  < > 7.0* 6.9* 6.1*  MG  --   --   --   --  2.0  PHOS 7.0*  --   --  6.0* 4.9*  < > = values in this interval not displayed. Sepsis Markers  Recent Labs Lab 03/26/14 2200  LATICACIDVEN 4.0*   ABG  Recent Labs Lab 03/27/14 2328 03/28/14 0413 03/28/14 1041  PHART 7.389 7.444 7.439  PCO2ART 49.4* 46.0* 43.3  PO2ART 105.0* 77.0* 97.0  Liver Enzymes  Recent Labs Lab 03/26/14 2200 03/27/14 1540 03/28/14 0405  AST 123*  --   --   ALT 14  --   --   ALKPHOS 169*  --   --   BILITOT 0.7  --   --   ALBUMIN 1.8* 1.5* 1.4*   Cardiac Enzymes No results found for this basename: TROPONINI, PROBNP,  in the last 168 hours  Glucose  Recent Labs Lab 03/27/14 1122 03/27/14 1514 03/27/14 2206 03/28/14 0027 03/28/14 0359 03/28/14 0711  GLUCAP 213* 178* 143* 124* 140* 133*   IMAGING: Dg Chest Port 1 View  03/26/2014   CLINICAL DATA:  Acute respiratory failure.  Intubation.  EXAM: PORTABLE CHEST - 1 VIEW  COMPARISON:  03/26/2014  FINDINGS: Endotracheal tube has been placed with tip approximately 3.5 cm above carina. Bilateral jugular central venous  catheters remain in place. A new nasogastric tube is seen entering the stomach.  Low lung volumes again seen. Bibasilar airspace disease shows mild interval worsening in right lung base. Heart size is stable. No pneumothorax identified.  IMPRESSION: New endotracheal tube and nasogastric tube in appropriate position.  Bibasilar airspace disease, with mild worsening noted in right lung base.   Electronically Signed   By: Earle Gell M.D.   On: 03/26/2014 21:59   Dg Chest Port 1 View  03/26/2014   CLINICAL DATA:  Hypoxia.  EXAM: PORTABLE CHEST - 1 VIEW  COMPARISON:  03/24/2014.  FINDINGS: Left IJ central line and right IJ dialysis catheter appear unchanged. Low lung volumes. Right perihilar airspace disease. No pneumothorax. Basilar atelectasis. Retrocardiac density has increased compared to prior, likely representing atelectasis and airspace disease in combination.  Compared to the recent prior, the airspace disease has increased, along with decreasing lung volumes.  IMPRESSION: 1. Stable support apparatus. 2. Lower lung volumes with increasing bilateral perihilar predominant airspace disease favored to represent volume overload and pulmonary edema. Pneumonia is considered less likely.   Electronically Signed   By: Dereck Ligas M.D.   On: 03/26/2014 20:55   ASSESSMENT / PLAN:  PULMONARY A: Acute respiratory failure Probable aspiration pneumonia ARDS Pulmonary edema May have high PLP due to ovbesity P:   Goal pH>7.20, SpO2>88, PLP<30  Continuous mechanical support VAP bundle Defer SBT as paralised Trend ABG/CXR  CARDIOVASCULAR A:  Septic shock Baseline hypotension P:  Goal MAP > 60 Levophed gtt Vasopressin gtt D/c Neo-Synephrine gtt Recheck lactate  RENAL A:   ESRD Metabolic acidosis P:   Nephrology CRRT D/c Bicarbonate gtt Start CVP q4h  GASTROINTESTINAL A:   Dark bloody secretions, unlikely significant GI hemorrhage Ileus / high gastric residuals Nutrition P:   GI  following  Protonix bid NPO Reassess 5/11 if able to tolerate TF; doubt now as very high gastric residuals  HEMATOLOGIC A:   Anemia Thrombocytopenia VTE Px P:  Trend CBC SCD  INFECTIOUS A:   Groin wound infection MRSA and Acinetobacter Aspiration pneumonia P:   Abx as above  ENDOCRINE A:   Diabetes Unknown adrenal function Morbid obesity P:   SSI Hydrocortisone, stress dose  NEUROLOGIC A:   Acute encephalopathy P:   Fentanyl / Versed D/c Nimbex Defer WUA as paralised  I have personally obtained history, examined patient, evaluated and interpreted laboratory and imaging results, reviewed medical records, formulated assessment / plan and placed orders.  CRITICAL CARE:  The patient is critically ill with multiple organ systems failure and requires high complexity decision making for assessment and support, frequent evaluation and titration  of therapies, application of advanced monitoring technologies and extensive interpretation of multiple databases. Critical Care Time devoted to patient care services described in this note is 35 minutes.   Doree Fudge, MD Pulmonary and [place] Pager: [phone]  03/28/2014, 11:38 AM

## 2014-03-28 NOTE — Progress Notes (Signed)
[facility] Progress Note Patient Name: Douglas [name] DOB: [birth year] MRN: [id number]  Date of Service  03/28/2014   HPI/Events of Note  Hypocalcemia   eICU Interventions  Calcium replaced   Intervention Category Intermediate Interventions: Electrolyte abnormality - evaluation and management  Guadelupe Sabin Basha Krygier 03/28/2014, 5:41 AM

## 2014-03-28 NOTE — Progress Notes (Addendum)
Assessment  51M with AKI(creat 0.9 on 01/18/14) in setting of ARB, nonhealing inguinal wound, and s/p treatment for NHL.  1. AKI, normal baseline GFR, dialysis dependent, 1st HD 02/22/14 2. NHL recent therapy 3. Nonhealing L inguinal wound draining 4. Hypotension 5. Anemia, s/p PRBCs 6. Diabetes Mellitus 7. VDRF/pulm edema 8. Septic shock- on pressors, weaning down somewhat  Plan: CVVHD D#2 today, trying to get volume down, -50 cc/hr, not on heparin, no filter issues. Increase UF 75-100 cc/hr net neg.   Subjective: Interval History: respiratory failure, intubated overnight and on pressors Objective: Vital signs in last 24 hours: Temp:  [97.5 F (36.4 C)-98.5 F (36.9 C)] 97.8 F (36.6 C) (05/10 0715) Pulse Rate:  [84-132] 84 (05/10 0745) Resp:  [18-35] 35 (05/10 0745) BP: (102-111)/(53-60) 105/53 mmHg (05/10 0333) SpO2:  [92 %-100 %] 99 % (05/10 0745) Arterial Line BP: (74-122)/(43-68) 103/53 mmHg (05/10 0745) FiO2 (%):  [50 %-70 %] 50 % (05/10 0600) Weight:  [232.1 kg (511 lb 11 oz)] 232.1 kg (511 lb 11 oz) (05/10 0500) Weight change: 4.1 kg (9 lb 0.6 oz)  Intake/Output from previous day: 05/09 0701 - 05/10 0700 In: 4670.3 [I.V.:3880.3; NG/GT:80; IV Piggyback:710] Out: 6038 [Emesis/NG output:650] Intake/Output this shift: Total I/O In: 32.7 [I.V.:32.7] Out: 138 [Other:138]  General appearance: intubated Morbidly obese unresponsive  Lab Results:  Recent Labs  03/26/14 0602 03/26/14 2200 03/27/14 0400  WBC 9.6  --  6.8  HGB 8.0* 9.5* 10.5*  HCT 25.4* 29.7* 32.8*  PLT 63*  --  56*   BMET:   Recent Labs  03/27/14 1540 03/28/14 0405  NA 134* 136*  K 3.7 3.7  CL 90* 88*  CO2 21 27  GLUCOSE 212* 142*  BUN 29* 28*  CREATININE 3.27* 2.92*  CALCIUM 6.9* 6.1*   No results found for this basename: PTH,  in the last 72 hours Iron Studies: No results found for this basename: IRON, TIBC, TRANSFERRIN, FERRITIN,  in the last 72 hours Studies/Results: Dg Chest Port  1 View  03/26/2014   CLINICAL DATA:  Acute respiratory failure.  Intubation.  EXAM: PORTABLE CHEST - 1 VIEW  COMPARISON:  03/26/2014  FINDINGS: Endotracheal tube has been placed with tip approximately 3.5 cm above carina. Bilateral jugular central venous catheters remain in place. A new nasogastric tube is seen entering the stomach.  Low lung volumes again seen. Bibasilar airspace disease shows mild interval worsening in right lung base. Heart size is stable. No pneumothorax identified.  IMPRESSION: New endotracheal tube and nasogastric tube in appropriate position.  Bibasilar airspace disease, with mild worsening noted in right lung base.   Electronically Signed   By: Earle Gell M.D.   On: 03/26/2014 21:59   Dg Chest Port 1 View  03/26/2014   CLINICAL DATA:  Hypoxia.  EXAM: PORTABLE CHEST - 1 VIEW  COMPARISON:  03/24/2014.  FINDINGS: Left IJ central line and right IJ dialysis catheter appear unchanged. Low lung volumes. Right perihilar airspace disease. No pneumothorax. Basilar atelectasis. Retrocardiac density has increased compared to prior, likely representing atelectasis and airspace disease in combination.  Compared to the recent prior, the airspace disease has increased, along with decreasing lung volumes.  IMPRESSION: 1. Stable support apparatus. 2. Lower lung volumes with increasing bilateral perihilar predominant airspace disease favored to represent volume overload and pulmonary edema. Pneumonia is considered less likely.   Electronically Signed   By: Dereck Ligas M.D.   On: 03/26/2014 20:55   Scheduled: . antiseptic oral rinse  1 application Mouth Rinse QID  . artificial tears  1 application Both Eyes 3 times per day  . cefTAZidime (FORTAZ)  IV  2 g Intravenous Q12H  . chlorhexidine  15 mL Mouth/Throat BID  . hydrocortisone sodium succinate  50 mg Intravenous Q6H  . insulin aspart  0-15 Units Subcutaneous 6 times per day  . pantoprazole (PROTONIX) IV  40 mg Intravenous Q12H  . sodium  chloride  3 mL Intravenous Q12H  . [START ON 03/29/2014] tobramycin  360 mg Intravenous Q48H  . vancomycin  2,000 mg Intravenous Q24H     LOS: 37 days   Sandy Salaam Leanndra Pember 03/28/2014,8:16 AM

## 2014-03-28 NOTE — Progress Notes (Signed)
Palliative Medicine consult received on 5/8. Family meeting to give medical update and to discuss goals of care scheduled for 9:30AM 5/11.  Douglas Hacker, DO Palliative Medicine

## 2014-03-28 NOTE — Progress Notes (Signed)
CRITICAL VALUE ALERT  Critical value received:  Ca = 6.1  Date of notification:  03/28/14  Time of notification:  0536  Critical value read back:yes  Nurse who received alert:  Delphia Grates RN   MD notified (1st page):  Dr Johnette Abraham Deterding  Time of first page:  [phone]  MD notified (2nd page):  Time of second page:  Responding MD:  Dr Johnette Abraham Deterding  Time MD responded:  [phone]

## 2014-03-29 ENCOUNTER — Inpatient Hospital Stay (HOSPITAL_COMMUNITY): Payer: BC Managed Care – PPO

## 2014-03-29 DIAGNOSIS — Z515 Encounter for palliative care: Secondary | ICD-10-CM

## 2014-03-29 LAB — GLUCOSE, CAPILLARY
GLUCOSE-CAPILLARY: 144 mg/dL — AB (ref 70–99)
Glucose-Capillary: 130 mg/dL — ABNORMAL HIGH (ref 70–99)
Glucose-Capillary: 121 mg/dL — ABNORMAL HIGH (ref 70–99)
Glucose-Capillary: 64 mg/dL — ABNORMAL LOW (ref 70–99)
Glucose-Capillary: 16 mg/dL — CL (ref 70–99)
Glucose-Capillary: 87 mg/dL (ref 70–99)
Glucose-Capillary: 48 mg/dL — ABNORMAL LOW (ref 70–99)
Glucose-Capillary: 121 mg/dL — ABNORMAL HIGH (ref 70–99)

## 2014-03-29 LAB — CBC
HCT: 38.1 % — ABNORMAL LOW (ref 39.0–52.0)
Hemoglobin: 11.8 g/dL — ABNORMAL LOW (ref 13.0–17.0)
MCH: 28.4 pg (ref 26.0–34.0)
MCHC: 31 g/dL (ref 30.0–36.0)
MCV: 91.6 fL (ref 78.0–100.0)
PLATELETS: 24 10*3/uL — AB (ref 150–400)
RBC: 4.16 MIL/uL — AB (ref 4.22–5.81)
RDW: 21.3 % — ABNORMAL HIGH (ref 11.5–15.5)
WBC: 13.5 10*3/uL — ABNORMAL HIGH (ref 4.0–10.5)

## 2014-03-29 LAB — RENAL FUNCTION PANEL
Albumin: 1.4 g/dL — ABNORMAL LOW (ref 3.5–5.2)
BUN: 23 mg/dL (ref 6–23)
CHLORIDE: 89 meq/L — AB (ref 96–112)
CO2: 16 mEq/L — ABNORMAL LOW (ref 19–32)
Calcium: 6.5 mg/dL — ABNORMAL LOW (ref 8.4–10.5)
Creatinine, Ser: 2.31 mg/dL — ABNORMAL HIGH (ref 0.50–1.35)
GFR calc Af Amer: 35 mL/min — ABNORMAL LOW (ref >90)
GFR calc non Af Amer: 30 mL/min — ABNORMAL LOW (ref >90)
GLUCOSE: 146 mg/dL — AB (ref 70–99)
POTASSIUM: 5 meq/L (ref 3.7–5.3)
Phosphorus: 5.7 mg/dL — ABNORMAL HIGH (ref 2.3–4.6)
Sodium: 133 mEq/L — ABNORMAL LOW (ref 137–147)

## 2014-03-29 LAB — POCT I-STAT 3, ART BLOOD GAS (G3+)
ACID-BASE DEFICIT: 9 mmol/L — AB (ref 0.0–2.0)
Bicarbonate: 18.7 mEq/L — ABNORMAL LOW (ref 20.0–24.0)
O2 Saturation: 95 %
Patient temperature: 98.6
TCO2: 20 mmol/L (ref 0–100)
pCO2 arterial: 44.4 mmHg (ref 35.0–45.0)
pH, Arterial: 7.234 — ABNORMAL LOW (ref 7.350–7.450)
pO2, Arterial: 89 mmHg (ref 80.0–100.0)

## 2014-03-29 LAB — MAGNESIUM: MAGNESIUM: 2.2 mg/dL (ref 1.5–2.5)

## 2014-03-29 MED ORDER — DEXTROSE 5 % IV SOLN
1.0000 g | Freq: Two times a day (BID) | INTRAVENOUS | Status: DC
  Filled 2014-03-29 (×2): qty 1

## 2014-03-29 MED ORDER — DEXTROSE 50 % IV SOLN
INTRAVENOUS | Status: AC
  Administered 2014-03-29: 25 mL
  Filled 2014-03-29: qty 50

## 2014-03-29 MED ORDER — STERILE WATER FOR INJECTION IV SOLN
INTRAVENOUS | Status: DC
  Administered 2014-03-29 (×2): via INTRAVENOUS
  Filled 2014-03-29 (×3): qty 850

## 2014-03-29 MED ORDER — DEXTROSE 50 % IV SOLN
INTRAVENOUS | Status: AC
  Administered 2014-03-29: 50 mL
  Filled 2014-03-29: qty 50

## 2014-03-29 MED ORDER — DEXTROSE 50 % IV SOLN
50.0000 mL | Freq: Once | INTRAVENOUS | Status: AC | PRN
  Administered 2014-03-29: 50 mL via INTRAVENOUS

## 2014-03-29 MED ORDER — DEXTROSE 50 % IV SOLN
INTRAVENOUS | Status: AC
  Administered 2014-03-29: 50 mL via INTRAVENOUS
  Filled 2014-03-29: qty 50

## 2014-03-29 MED ORDER — PHENYLEPHRINE HCL 10 MG/ML IJ SOLN
30.0000 ug/min | INTRAVENOUS | Status: DC
  Administered 2014-03-29 (×3): 200 ug/min via INTRAVENOUS
  Administered 2014-03-29: 50 ug/min via INTRAVENOUS
  Filled 2014-03-29 (×4): qty 4

## 2014-03-29 MED ORDER — HEPARIN SODIUM (PORCINE) 1000 UNIT/ML DIALYSIS
1000.0000 [IU] | INTRAMUSCULAR | Status: DC | PRN
  Administered 2014-03-29: 5000 [IU] via INTRAVENOUS_CENTRAL
  Filled 2014-03-29: qty 5
  Filled 2014-03-29: qty 6

## 2014-03-29 NOTE — Progress Notes (Signed)
Paged to room by nurse to meet with patient's family. Family is expecting the death of the patient soon. Talked with family and offered pastoral care and support. Prayed with family and offered additional support if needed.

## 2014-03-29 NOTE — Progress Notes (Signed)
Noted medical decline. We will sign off at this time. [phone]

## 2014-03-29 NOTE — Progress Notes (Signed)
PT Cancellation Note and D/C Note  Patient Details Name: Douglas [name] MRN: [id number] DOB: [birth year]   Cancelled Treatment:    Reason Eval/Treat Not Completed: Medical issues which prohibited therapy.  Pt critically ill and on CVVHD with multiple pressors.  Not appropriate for therapies at this time.  Will sign off and MD please reorder if pt's status changes.  Thanks.   [facility] 03/29/2014, 9:17 AM [facility] [phone] (pager)

## 2014-03-29 NOTE — Progress Notes (Signed)
Full note to follow:  I met with significant other and two sisters. We discussed Douglas [name] health decline and that his organs are shutting down and he is dying. They are considering full comfort and liberation of life sustaining measures. We also discussed that he is not tolerating hemodialysis but they are not able to tell me today to stop trying hemodialysis. They will consult with other family members and consider options overnight and we will re-meet tomorrow 10 am for decisions. They realize that he could die at anytime. They agree to no CPR. They also want to make sure he is comfortable. I will continue to follow and support. Please call with any further acute change. Thank you for this consult.   Vinie Sill, NP Palliative Medicine Team Pager # [phone] (M-F 8a-5p) Team Phone # [phone] (Nights/Weekends)

## 2014-03-29 NOTE — Progress Notes (Signed)
CVVHD management.  He is on 3 pressors with low BP. On all 4K for replacement along with bicarb gtt.  Cont to pull fluid as BP tolerates.  Cont bicarb gtt for now.  Overall prognosis is poor.  Palliative care to meet with family today.

## 2014-03-29 NOTE — Progress Notes (Signed)
Clotted filter times 2, discussed with Dr Mercy Moore and Dr Chase Caller as well as with family. Per family ok to stop dialysis and they understand this will not improve prognosis. Per family, plans to withdrawal tomorrow. They will spend tonight gathering the rest of the family to say their good byes. CRRT turned of per Dr Robet Leu and Chase Caller

## 2014-03-29 NOTE — Progress Notes (Signed)
CRITICAL VALUE ALERT  Critical value received:  Platelets 24   Date of notification:  03/29/2014  Time of notification:  0510  Critical value read back:yes  Nurse who received alert:  Mick Sell   MD notified (1st page):  Dr Jamal Collin  Time of first page:  0515  MD notified (2nd page):  Time of second page:  Responding MD:  Dr Jamal Collin   Time MD responded:  [phone]

## 2014-03-29 NOTE — Progress Notes (Signed)
I agree with the following treatment note after reviewing documentation.   Stormy Connon, Brynn OTR/L Pager: [phone] Office: [phone] .   

## 2014-03-29 NOTE — Progress Notes (Signed)
I spoke with family at bedside who are grieving that their brother is dying. They are also a little relieved that they do not really have to make the decision to liberate life sustaining measures. I provided emotional support and offered to call the chaplain but they already had a friend there who they said is a Clinical biochemist and supporting them. They ensure that we are keeping him comfortable. I also spoke with them about titrating vasopressors down once all the family gets there for comfort - they are to tell the nurse when they are ready for this step. I will continue to follow and support this family and will follow up in the morning if he lives through the night.  Vinie Sill, NP Palliative Medicine Team Pager # [phone] (M-F 8a-5p) Team Phone # [phone] (Nights/Weekends)

## 2014-03-29 NOTE — Consult Note (Signed)
Patient Douglas [name]      DOB: 1956-05-31      DEY:[id number]     Consult Note from the Palliative Medicine Team at [place] Requested by: PCCM     PCP: [name], MD Reason for Consultation: GOC and options.    Phone Number:[phone]  Assessment of patients Current state: Douglas [name] is a unfortunate 58 yo male on life support including ventilator, maximum of 3 vasopressors (Neosynephrine, Levophed, Vasopressin), and not tolerating hemodialysis.  I met today with his partner of 26 years: Douglas [name] [phone]), and sisters: Douglas [name] [phone], [phone]) and Douglas [name] [phone], [phone]). We meet today to discuss Douglas [name] poor health and extremely poor prognosis. I tell them that he is dying and they know this already. They are tearful and processing but understand that there is nothing more for [place] to medically do for him. We discussed his body not tolerating or reacting to our treatments - and this isn't working. They confirm no CPR but wish to maintain current treatment while they discuss withdrawal of life sustaining measures tomorrow with the rest of their family. We will re-meet tomorrow at 1000 am. They understand that he could decline and pass at anytime.    Goals of Care: 1.  Code Status: Maintain current active treatment. No CPR.    2. Scope of Treatment: Continue current treatments with plan for probably deescalation of care tomorrow.    4. Disposition: Anticipate hospital death.    3. Symptom Management:   1. Pain: Fentanyl and versed infusions - titrate for comfort.   4. Psychosocial: Emotional support provided to family during very difficult conversation.   5. Spiritual: Support from personal friends and church.    Patient Documents Completed or Given: Document Given Completed  Advanced Directives Pkt    MOST    DNR    Gone from My [facility]    Hard Choices yes     Brief HPI: Unfortunate 58 yo male at end of life.     ROS: Unable to assess - unresponsive.     PMH:  Past Medical History  Diagnosis Date  . Non Hodgkin's lymphoma January 2009  . Diabetes mellitus without complication   . Hypertension   . Sleep apnea   . Lymphadenopathy, inguinal     left     PSH: Past Surgical History  Procedure Laterality Date  . Biopsy for lymphoma Left 11/02/13    groin  . Colonoscopy N/A 12/22/2013    Procedure: COLONOSCOPY;  Surgeon: Beryle Beams, MD;  Location: [facility];  Service: Endoscopy;  Laterality: N/A;  . Esophagogastroduodenoscopy N/A 12/22/2013    Procedure: ESOPHAGOGASTRODUODENOSCOPY (EGD);  Surgeon: Beryle Beams, MD;  Location: [facility];  Service: Endoscopy;  Laterality: N/A;  . Ercp N/A 12/24/2013    Procedure: UPPER ENDOSCOPY;  Surgeon: Beryle Beams, MD;  Location: [facility];  Service: Gastroenterology;  Laterality: N/A;  . Colonoscopy N/A 12/24/2013    Procedure: COLONOSCOPY;  Surgeon: Beryle Beams, MD;  Location: [facility];  Service: Gastroenterology;  Laterality: N/A;  . Insertion of dialysis catheter Right 02/27/2014    Procedure: Removal of catheter,  INSERTION OF DIALYSIS CATHETER right IJ;  Surgeon: Conrad Brussels, MD;  Location: [place];  Service: Vascular;  Laterality: Right;   I have reviewed the [place] and SH and  If appropriate update it with new information. No Known Allergies Scheduled Meds: . antiseptic oral rinse  1 application Mouth Rinse QID  .  cefTAZidime (FORTAZ)  IV  2 g Intravenous Q12H  . chlorhexidine  15 mL Mouth/Throat BID  . hydrocortisone sodium succinate  50 mg Intravenous Q6H  . insulin aspart  0-15 Units Subcutaneous 6 times per day  . pantoprazole (PROTONIX) IV  40 mg Intravenous Q12H  . sodium chloride  3 mL Intravenous Q12H  . tobramycin  360 mg Intravenous Q48H  . vancomycin  2,000 mg Intravenous Q24H   Continuous Infusions: . fentaNYL infusion INTRAVENOUS 100 mcg/hr (03/29/14 0300)  . midazolam (VERSED) infusion 3 mg/hr (03/29/14 0358)  .  norepinephrine (LEVOPHED) Adult infusion 50 mcg/min (03/29/14 0101)  . phenylephrine (NEO-SYNEPHRINE) Adult infusion 200 mcg/min (03/29/14 0937)  . dialysis replacement fluid (prismasate) 300 mL/hr at 03/28/14 1820  . dialysis replacement fluid (prismasate) 300 mL/hr at 03/28/14 0501  . dialysate (PRISMASATE) 1,000 mL/hr at 03/28/14 2320  .  sodium bicarbonate 150 mEq in sterile water 1000 mL infusion 50 mL/hr at 03/29/14 0450  . vasopressin (PITRESSIN) infusion - *FOR SHOCK* 0.03 Units/min (03/28/14 2001)   PRN Meds:.diphenhydrAMINE, sodium chloride, sodium chloride, sodium chloride    BP 76/64  Pulse 64  Temp(Src) 96.3 F (35.7 C) (Axillary)  Resp 19  Ht 5' 10.08" (1.78 m)  Wt 234 kg (515 lb 14 oz)  BMI 73.85 kg/m2  SpO2 83%   PPS: 10%   Intake/Output Summary (Last 24 hours) at 03/29/14 1020 Last data filed at 03/29/14 1000  Gross per 24 hour  Intake 2271.97 ml  Output   4088 ml  Net -1816.03 ml   LBM: 03/24/14                         Physical Exam:  General: NAD, ill appearing HEENT: [place]/AT, mucous membranes moist without exudate, no JVD Chest: Rhonchi throughout, intubated CVS: RRR, S1 S2, hypotensive Abdomen: Soft, distended, no BS Ext: Diffuse anasarca, extremities cool to touch, not moving Neuro: Unresponsive  Labs: CBC    Component Value Date/Time   WBC 13.5* 03/29/2014 0455   WBC 7.9 01/19/2014 1101   RBC 4.16* 03/29/2014 0455   RBC 3.02* 01/19/2014 1101   HGB 11.8* 03/29/2014 0455   HGB 8.4* 01/19/2014 1101   HCT 38.1* 03/29/2014 0455   HCT 26.0* 01/19/2014 1101   PLT 24* 03/29/2014 0455   PLT 477* 01/19/2014 1101   MCV 91.6 03/29/2014 0455   MCV 86.1 01/19/2014 1101   MCH 28.4 03/29/2014 0455   MCH 27.9 01/19/2014 1101   MCHC 31.0 03/29/2014 0455   MCHC 32.5 01/19/2014 1101   RDW 21.3* 03/29/2014 0455   RDW 16.2* 01/19/2014 1101   LYMPHSABS 0.7 03/19/2014 0725   LYMPHSABS 0.0* 01/19/2014 1101   MONOABS 2.1* 03/19/2014 0725   MONOABS 0.9 01/19/2014 1101   EOSABS 0.1 03/19/2014  0725   EOSABS 0.0 01/19/2014 1101   BASOSABS 0.0 03/19/2014 0725   BASOSABS 0.0 01/19/2014 1101    BMET    Component Value Date/Time   NA 133* 03/29/2014 0345   NA 137 01/19/2014 1101   K 5.0 03/29/2014 0345   K 3.7 01/19/2014 1101   CL 89* 03/29/2014 0345   CO2 16* 03/29/2014 0345   CO2 27 01/19/2014 1101   GLUCOSE 146* 03/29/2014 0345   GLUCOSE 204* 01/19/2014 1101   BUN 23 03/29/2014 0345   BUN 15.8 01/19/2014 1101   CREATININE 2.31* 03/29/2014 0345   CREATININE 0.9 01/19/2014 1101   CALCIUM 6.5* 03/29/2014 0345   CALCIUM 9.8 01/19/2014 1101  GFRNONAA 30* 03/29/2014 0345   GFRAA 35* 03/29/2014 0345    CMP     Component Value Date/Time   NA 133* 03/29/2014 0345   NA 137 01/19/2014 1101   K 5.0 03/29/2014 0345   K 3.7 01/19/2014 1101   CL 89* 03/29/2014 0345   CO2 16* 03/29/2014 0345   CO2 27 01/19/2014 1101   GLUCOSE 146* 03/29/2014 0345   GLUCOSE 204* 01/19/2014 1101   BUN 23 03/29/2014 0345   BUN 15.8 01/19/2014 1101   CREATININE 2.31* 03/29/2014 0345   CREATININE 0.9 01/19/2014 1101   CALCIUM 6.5* 03/29/2014 0345   CALCIUM 9.8 01/19/2014 1101   PROT 5.1* 03/26/2014 2200   PROT 7.3 01/19/2014 1101   ALBUMIN 1.4* 03/29/2014 0345   ALBUMIN 2.0* 01/19/2014 1101   AST 123* 03/26/2014 2200   AST 23 01/19/2014 1101   ALT 14 03/26/2014 2200   ALT 15 01/19/2014 1101   ALKPHOS 169* 03/26/2014 2200   ALKPHOS 186* 01/19/2014 1101   BILITOT 0.7 03/26/2014 2200   BILITOT 0.44 01/19/2014 1101   GFRNONAA 30* 03/29/2014 0345   GFRAA 35* 03/29/2014 0345     Time In Time Out Total Time Spent with Patient Total Overall Time  0930 1040 85mn 758m    Greater than 50%  of this time was spent counseling and coordinating care related to the above assessment and plan.  AlVinie SillNP Palliative Medicine Team Pager # [phone]-F 8a-5p) Team Phone # [phone]/Weekends)

## 2014-03-29 NOTE — Progress Notes (Signed)
RN paged me to inform that patient actively dying sbp 22s and family aware and ready to let him pass. They had planned for terminal wean anyway tomorrow. CVVHD has been stopped due to clotting of filter.  Plan No further titration of pressors Ensure comfort chaplain to family Expected survival few to several hours No cpr No defib No chemical code  D/w Elmo Putt of pall care   Dr. Brand Males, M.D., [facility].C.P Pulmonary and Critical Care Medicine Staff Physician [place] [facility] Pager: [phone], If no answer or between  15:00h - 7:00h: call [phone]  03/29/2014 2:13 PM

## 2014-03-29 NOTE — Progress Notes (Signed)
Chaplain consulted with pt's RN. She said that pt's sisters are next of kin and they are currently not present. Chaplain requested that RN page chaplain when pt's sisters arrive.   Ethelene Browns [phone]

## 2014-03-29 NOTE — Progress Notes (Signed)
PULMONARY / CRITICAL CARE MEDICINE  Name: Douglas [name] MRN: [id number] DOB: Apr 16, 1956    ADMISSION DATE:  2014-02-24 CONSULTATION DATE:  2014/02/24  REFERRING MD : [facility]  PRIMARY SERVICE: PCCM  CHIEF COMPLAINT: Hypotension, respiratory failure  BRIEF PATIENT DESCRIPTION: 58 yo with low-grade non-Hodgkin's lymphoma s/p recent chemotherapy and admission for AKI 3/17-3/20 who presented on 4/3 as a transfer from [facility] with 1.5 week history of shortness of breath found to have acute renal failure requiring emergent CRRT. On 5/8 developed acute encephalopathy, hypotension, hypoxemia and hypercarbia requiring intubation and vasopressors.   LINES / TUBES: R IJ HD cath 4/20 >>>  CULTURES: 4/28  Wound  >>> MRSA and ACINETOBACTER MDR   ANTIBIOTICS: Ceftazidime 5/8 >>> Vancomycin 5/8 >>> Tobramycin 5/8 >>>   SIGNIFICANT EVENTS / STUDIES:   03/28/14:  Vasopressor / oxygen requirements decreased.    SUBJECTIVE/OVERNIGHT/INTERVAL HX 03/29/14: DNAR now. Family considering dc HD and terminal wean but emotionally not ready . Maxed out on pressors. On 50% fio2/peep 8.   VITAL SIGNS: Temp:  [96.3 F (35.7 C)-98.6 F (37 C)] 96.3 F (35.7 C) (05/11 0740) Pulse Rate:  [25-117] 64 (05/11 0615) Resp:  [15-35] 19 (05/11 0740) BP: (76-89)/(57-64) 76/64 mmHg (05/10 2335) SpO2:  [83 %-100 %] 83 % (05/11 0605) Arterial Line BP: (69-144)/(46-86) 80/52 mmHg (05/11 0740) FiO2 (%):  [50 %] 50 % (05/11 0850) Weight:  [234 kg (515 lb 14 oz)] 234 kg (515 lb 14 oz) (05/11 0525)  HEMODYNAMICS: CVP:  [0 mmHg-13 mmHg] 0 mmHg VENTILATOR SETTINGS: Vent Mode:  [-] PRVC FiO2 (%):  [50 %] 50 % Set Rate:  [30 bmp-35 bmp] 30 bmp Vt Set:  [440 mL] 440 mL PEEP:  [8 cmH20] 8 cmH20 Plateau Pressure:  [24 cmH20-25 cmH20] 24 cmH20  INTAKE / OUTPUT: Intake/Output     05/10 0701 - 05/11 0700 05/11 0701 - 05/12 0700   I.V. (mL/kg) 1951.1 (8.3) 434.6 (1.9)   NG/GT     IV Piggyback 659 50   Total  Intake(mL/kg) 2610.1 (11.2) 484.6 (2.1)   Emesis/NG output 40    Other 4288 305   Total Output 4328 305   Net -1717.9 +179.6        Urine Occurrence      PHYSICAL EXAMINATION: General: No nistress Neuro: Sedated. Breathes above ventilator HEENT:  Moist membranes Cardiovascular:  Regular, no murmurs Lungs: Rhonchi bilaterally Abdomen: Soft distended abdomen, no bowel sounds Musculoskeletal:  BLE edema Skin:  Wound L groin, R groin bleeding  LABS: PULMONARY  Recent Labs Lab 03/27/14 2009 03/27/14 2328 03/28/14 0413 03/28/14 1041 03/29/14 0357  PHART 7.349* 7.389 7.444 7.439 7.234*  PCO2ART 50.3* 49.4* 46.0* 43.3 44.4  PO2ART 88.0 105.0* 77.0* 97.0 89.0  HCO3 27.9* 29.9* 31.7* 29.4* 18.7*  TCO2 29 31 33 31 20  O2SAT 96.0 98.0 96.0 98.0 95.0    CBC  Recent Labs Lab 03/26/14 0602 03/26/14 2200 03/27/14 0400 03/29/14 0455  HGB 8.0* 9.5* 10.5* 11.8*  HCT 25.4* 29.7* 32.8* 38.1*  WBC 9.6  --  6.8 13.5*  PLT 63*  --  56* 24*    COAGULATION  Recent Labs Lab 03/24/14 0832  INR 1.02    CARDIAC  No results found for this basename: TROPONINI,  in the last 168 hours No results found for this basename: PROBNP,  in the last 168 hours   [place] [facility] 03/25/14 0608  03/27/14 0400 03/27/14 1540 03/28/14 0405 03/28/14 1600 03/29/14 0330 03/29/14 0345  NA 134*  < > 135* 134* 136* 135*  --  133*  K 4.4  < > 3.8 3.7 3.7 4.3  --  5.0  CL 92*  < > 94* 90* 88* 90*  --  89*  CO2 21  < > 20 21 27 21   --  16*  GLUCOSE 149*  < > 211* 212* 142* 155*  --  146*  BUN 50*  < > 32* 29* 28* 24*  --  23  CREATININE 5.39*  < > 3.68* 3.27* 2.92* 2.54*  --  2.31*  CALCIUM 7.7*  < > 7.0* 6.9* 6.1* 6.1*  --  6.5*  MG  --   --   --   --  2.0  --  2.2  --   PHOS 7.0*  --   --  6.0* 4.9* 4.9*  --  5.7*  < > = values in this interval not displayed. Estimated Creatinine Clearance: 69.4 ml/min (by C-G formula based on Cr of 2.31).   LIVER  Recent Labs Lab  03/24/14 0832  03/26/14 2200 03/27/14 1540 03/28/14 0405 03/28/14 1600 03/29/14 0345  AST  --   --  123*  --   --   --   --   ALT  --   --  14  --   --   --   --   ALKPHOS  --   --  169*  --   --   --   --   BILITOT  --   --  0.7  --   --   --   --   PROT  --   --  5.1*  --   --   --   --   ALBUMIN  --   < > 1.8* 1.5* 1.4* 1.3* 1.4*  INR 1.02  --   --   --   --   --   --   < > = values in this interval not displayed.   INFECTIOUS  Recent Labs Lab 03/26/14 2200 03/28/14 1200  LATICACIDVEN 4.0* 6.1*     ENDOCRINE CBG (last 3)   Recent Labs  03/29/14 0011 03/29/14 0353 03/29/14 0756  GLUCAP 144* 130* 121*         IMAGING x48h  Dg Chest Port 1 View  03/29/2014   CLINICAL DATA:  Followup of lung infiltrates  EXAM: PORTABLE CHEST - 1 VIEW  COMPARISON:  Portable chest x-ray of 03/26/2014  FINDINGS: The lungs are not well aerated and there is slight worsening of patchy lung infiltrates bilaterally. The endotracheal tube is unchanged in position, as are the central venous lines. Heart size is stable. NG tube remains.  IMPRESSION: Diminished aeration with worsening of patchy lung infiltrates bilaterally.   Electronically Signed   By: Ivar Drape M.D.   On: 03/29/2014 07:32     ASSESSMENT / PLAN:  PULMONARY A: Acute respiratory failure Probable aspiration pneumonia ARDS Pulmonary edema May have high PLP due to ovbesity   - Does not meet sbt criteria due to MODS  P:   Continuous mechanical support VAP bundle Defer SBT  Trend ABG/CXR  CARDIOVASCULAR A:  Septic shock   - refractory shock on multiple pressors  P:  Goal MAP > 60 Levophed gtt Vasopressin gtt Neo-Synephrine gtt Recheck lactate  RENAL A:   ESRD Metabolic acidosis   - severe lactic acidosis ? worse P:   Nephrology CRRT Recheck lactate Continue bicarb gtt  GASTROINTESTINAL A:   Dark  bloody secretions, unlikely significant GI hemorrhage Ileus / high gastric  residuals Nutrition  - unable to handle tube feeds   P:   GI following  Protonix bid NPO   HEMATOLOGIC A:   Anemia Thrombocytopenia VTE Px P:  Trend CBC SCD  INFECTIOUS A:   Groin wound infection MRSA and Acinetobacter Aspiration pneumonia P:   Abx as above  ENDOCRINE A:   Diabetes Unknown adrenal function Morbid obesity P:   SSI Hydrocortisone, stress dose  NEUROLOGIC A:   Acute encephalopathy   RASS -4 on sedation gtt. Off nimbex since 03/28/14. Breathes above ventilator   P:   Fentanyl / Versed No WUA due to MODS   GLOBAL 03/29/14: Palliative care goals: DNAR but full medical care. Patient is actively dying. Family coming to terms    The patient is critically ill with multiple organ systems failure and requires high complexity decision making for assessment and support, frequent evaluation and titration of therapies, application of advanced monitoring technologies and extensive interpretation of multiple databases.   Critical Care Time devoted to patient care services described in this note is  35  Minutes.  Dr. Brand Males, M.D., [facility].C.P Pulmonary and Critical Care Medicine Staff Physician [place] [facility] Pager: [phone], If no answer or between  15:00h - 7:00h: call [phone]  03/29/2014 10:53 AM

## 2014-03-29 NOTE — Progress Notes (Signed)
ANTIBIOTIC CONSULT NOTE - FOLLOW UP  Pharmacy Consult for vancomycin/fortaz/tobramycin Indication: sepsis  No Known Allergies  Patient Measurements: Height: 5' 10.08" (178 cm) Weight: 515 lb 14 oz (234 kg) IBW/kg (Calculated) : 73.18 ABW = 119 kg  Vital Signs: Temp: 96.2 F (35.7 C) (05/11 1200) Temp src: Axillary (05/11 1200) Pulse Rate: 64 (05/11 0615) Intake/Output from previous day: 05/10 0701 - 05/11 0700 In: 2610.1 [I.V.:1951.1; IV Piggyback:659] Out: 4328 [Emesis/NG output:40] Intake/Output from this shift: Total I/O In: 484.6 [I.V.:434.6; IV Piggyback:50] Out: 305 [Other:305]  Labs:  Recent Labs  03/26/14 2200 03/27/14 0400  03/28/14 0405 03/28/14 1600 03/29/14 0345 03/29/14 0455  WBC  --  6.8  --   --   --   --  13.5*  HGB 9.5* 10.5*  --   --   --   --  11.8*  PLT  --  56*  --   --   --   --  24*  CREATININE 3.61* 3.68*  < > 2.92* 2.54* 2.31*  --   < > = values in this interval not displayed. Estimated Creatinine Clearance: 69.4 ml/min (by C-G formula based on Cr of 2.31).  Recent Labs  03/27/14 2325  TOBRARND 2.5     Microbiology: Recent Results (from the past 720 hour(s))  WOUND CULTURE     Status: None   Collection Time    03/16/14 10:12 AM      Result Value Ref Range Status   Specimen Description WOUND LEG LEFT   Final   Special Requests NONE   Final   Gram Stain     Final   Value: RARE WBC PRESENT, PREDOMINANTLY PMN     NO SQUAMOUS EPITHELIAL CELLS SEEN     ABUNDANT GRAM NEGATIVE RODS     MODERATE GRAM POSITIVE COCCI     IN PAIRS   Culture     Final   Value: ABUNDANT ACINETOBACTER CALCOACETICUS/BAUMANNII COMPLEX     Note: COLISTIN SENSITIVE 0.38ug/mL TIGECYCLINE 2 ML/ug     ABUNDANT METHICILLIN RESISTANT STAPHYLOCOCCUS AUREUS     Note: RIFAMPIN AND GENTAMICIN SHOULD NOT BE USED AS SINGLE DRUGS FOR TREATMENT OF STAPH INFECTIONS. CRITICAL RESULT CALLED TO, READ BACK BY AND VERIFIED WITH: ASHLEY LARSON @ 9:58AM 03/21/14 BY DWEEKS      Performed at [facility]   Report Status 03/22/2014 FINAL   Final   Organism ID, Bacteria ACINETOBACTER CALCOACETICUS/BAUMANNII COMPLEX   Final   Organism ID, Bacteria METHICILLIN RESISTANT STAPHYLOCOCCUS AUREUS   Final    Anti-infectives   Start     Dose/Rate Route Frequency Ordered Stop   03/29/14 0000  tobramycin (NEBCIN) 340 mg in dextrose 5 % 50 mL IVPB  Status:  Discontinued     340 mg 117 mL/hr over 30 Minutes Intravenous Every 48 hours 03/28/14 0012 03/28/14 0018   03/29/14 0000  tobramycin (NEBCIN) 360 mg in dextrose 5 % 50 mL IVPB     360 mg 118 mL/hr over 30 Minutes Intravenous Every 48 hours 03/28/14 0018     03/28/14 0030  tobramycin (NEBCIN) 340 mg in dextrose 5 % 50 mL IVPB  Status:  Discontinued     340 mg 117 mL/hr over 30 Minutes Intravenous Every 24 hours 03/27/14 1254 03/28/14 0011   03/27/14 2300  vancomycin (VANCOCIN) 2,000 mg in sodium chloride 0.9 % 500 mL IVPB  Status:  Discontinued     2,000 mg 250 mL/hr over 120 Minutes Intravenous Every 24 hours 03/27/14 1254 03/27/14  1436   03/27/14 1600  vancomycin (VANCOCIN) 2,000 mg in sodium chloride 0.9 % 500 mL IVPB     2,000 mg 250 mL/hr over 120 Minutes Intravenous Every 24 hours 03/27/14 1436     03/27/14 1300  cefTAZidime (FORTAZ) 2 g in dextrose 5 % 50 mL IVPB     2 g 100 mL/hr over 30 Minutes Intravenous Every 12 hours 03/27/14 1254     03/27/14 0000  tobramycin (NEBCIN) 340 mg in dextrose 5 % 50 mL IVPB     2.5 mg/kg  135 kg (Adjusted) 117 mL/hr over 30 Minutes Intravenous  Once 03/26/14 2137 03/27/14 0030   03/26/14 2300  vancomycin (VANCOCIN) 2,000 mg in sodium chloride 0.9 % 500 mL IVPB     2,000 mg 250 mL/hr over 120 Minutes Intravenous  Once 03/26/14 2137 03/27/14 0045   03/26/14 2145  cefTAZidime (FORTAZ) 2 g in dextrose 5 % 50 mL IVPB     2 g 100 mL/hr over 30 Minutes Intravenous NOW 03/26/14 2137 03/26/14 2234   03/06/2014 0953  ceFAZolin (ANCEF) 1-5 GM-% IVPB    Comments:  [name]    : cabinet override      02/20/2014 9518 03/13/2014 2159   03/17/2014 0953  ceFAZolin (ANCEF) 2-3 GM-% IVPB SOLR    Comments:  [name]   : cabinet override      03/02/2014 0953 03/16/2014 2159   03/14/2014 0600  [MAR Hold]  ceFAZolin (ANCEF) 3 g in dextrose 5 % 50 mL IVPB     (On MAR Hold since 03/11/2014 0850)   3 g 160 mL/hr over 30 Minutes Intravenous On call 03/07/14 1556 03/17/2014 1021   02/23/2014 0000  ceFAZolin (ANCEF) IVPB 1 g/50 mL premix  Status:  Discontinued    Comments:  Send with pt to OR   1 g 100 mL/hr over 30 Minutes Intravenous On call 03/07/14 1540 03/07/14 1555   03/01/14 1800  doxycycline (VIBRA-TABS) tablet 100 mg  Status:  Discontinued     100 mg Oral Every 12 hours 03/01/14 1522 03/10/14 1119   03/01/14 0800  ciprofloxacin (CIPRO) tablet 500 mg  Status:  Discontinued     500 mg Oral Daily with breakfast 02/28/14 1106 03/10/14 1119   02/26/14 0000  doxycycline (VIBRAMYCIN) 100 MG capsule     100 mg Oral 2 times daily 02/26/14 1153     02/26/14 0000  ciprofloxacin (CIPRO) 500 MG tablet     500 mg Oral 2 times daily 02/26/14 1153     02/25/14 1100  ciprofloxacin (CIPRO) IVPB 400 mg  Status:  Discontinued     400 mg 200 mL/hr over 60 Minutes Intravenous Every 12 hours 02/25/14 1047 02/28/14 1106   02/24/14 1830  doxycycline (VIBRAMYCIN) 100 mg in dextrose 5 % 250 mL IVPB  Status:  Discontinued     100 mg 125 mL/hr over 120 Minutes Intravenous Every 12 hours 02/24/14 1756 03/01/14 1521   02/24/14 1800  cefTRIAXone (ROCEPHIN) 1 g in dextrose 5 % 50 mL IVPB  Status:  Discontinued     1 g 100 mL/hr over 30 Minutes Intravenous Every 24 hours 02/24/14 1231 02/24/14 1756   02/24/14 1200  vancomycin (VANCOCIN) 1,500 mg in sodium chloride 0.9 % 250 mL IVPB  Status:  Discontinued     1,500 mg 250 mL/hr over 60 Minutes Intravenous  Once 02/23/14 1423 02/24/14 1231   02/20/14 2200  vancomycin (VANCOCIN) 1,500 mg in sodium chloride 0.9 % 500 mL IVPB  Status:  Discontinued     1,500  mg 250 mL/hr over 120 Minutes Intravenous Every 24 hours 02/24/2014 2119 02/22/14 1116   02/23/2014 2200  vancomycin (VANCOCIN) 2,000 mg in sodium chloride 0.9 % 500 mL IVPB     2,000 mg 250 mL/hr over 120 Minutes Intravenous  Once 03/01/2014 2119 02/20/14 0005      Assessment: 58 yo M who is morbidly obese and has a pre-existing wound to L groin area (home health changes dressings). Swelling to BLE.  Receiving chemo with bendamustine (Treanda) and Rituxan q28d - Dr. Julien Nordmann. Last chemo 02/03/14.  He continued on Fortaz, vancomycin and tobramycin. Was on CRRT however this was stopped today d/t continued clotting. SCr 2.31 and estimated normal (without weight) CrCl is ~22mL/min.  Goal of Therapy:  Vancomycin trough 15-11mcg/mL Tobramycin trough 2mg /L  Plan:  1. Fortaz 1g IV q12h 2. Vancomycin 2g IV q24h 3. Tobramycin 360 q48h  Makyiah Lie D. Kyliana Standen, PharmD, BCPS Clinical Pharmacist Pager: [phone] 03/29/2014 1:43 PM

## 2014-03-29 NOTE — Progress Notes (Signed)
Nutrition Brief Note  Chart reviewed. Patient discussed in ICU rounds. Plans for withdrawal of care tomorrow.  No further nutrition interventions warranted at this time.  Please re-consult as needed.   Molli Barrows, RD, LDN, [place] Pager [phone] After Hours Pager [phone]

## 2014-03-30 LAB — GLUCOSE, CAPILLARY: Glucose-Capillary: <10 mg/dL — CL (ref 70–99)

## 2014-04-01 LAB — GLUCOSE, CAPILLARY: GLUCOSE-CAPILLARY: 109 mg/dL — AB (ref 70–99)

## 2014-04-01 NOTE — Discharge Summary (Signed)
DISCHARGE SUMMARY    Date of admit: 2014-03-03 11:23 AM Date of discharge: 03/24/2014  1:52 AM Length of Stay: 39 days  PCP is [name], MD   PROBLEM LIST Active Problems: Septic shock    NHL (non-Hodgkin's lymphoma)   HTN (hypertension)   Dyslipidemia   Anemia of chronic disease   Morbid obesity   Lower GI bleed   Diabetes mellitus   AKI (acute kidney injury)   Hypotension   Non-healing left groin open wound   Obstructive sleep apnea   Acute respiratory failure   Septic shock(785.52)   Palliative care encounter    SUMMARY Douglas [name] was 57 y.o. patient with    has a past medical history of Non Hodgkin's lymphoma (January 2009); Diabetes mellitus without complication; Hypertension; Sleep apnea; and Lymphadenopathy, inguinal.   has past surgical history that includes biopsy for lymphoma (Left, 11/02/13); Colonoscopy (N/A, 12/22/2013); Esophagogastroduodenoscopy (N/A, 12/22/2013); ERCP (N/A, 12/24/2013); Colonoscopy (N/A, 12/24/2013); and Insertion of dialysis catheter (Right, 03/03/2014).   Admitted on 03-03-14 with   low-grade non-Hodgkin's lymphoma s/p recent chemotherapy and admission for AKI 3/17-3/20 who presented on 4/3 as a transfer from [place] with 1.5 week history of shortness of breath found to have acute renal failure requiring emergent CRRT. On 5/8 developed acute encephalopathy, hypotension, hypoxemia and hypercarbia requiring intubation and vasopressors.    In ICU despite aggressive support he developed multi-organ failure. Palliative care consult was involved. Patient expired 03/21/2014      SIGNED Dr. Brand Males, M.D., F.C.C.P Pulmonary and Critical Care Medicine Staff Physician [place] [facility] Pager: [phone], If no answer or between  15:00h - 7:00h: call [phone]  04/01/2014 5:25 PM

## 2014-04-06 NOTE — Consult Note (Signed)
I have reviewed and discussed the care of this patient in detail with the nurse practitioner including pertinent patient records, physical exam findings and data. I agree with details of this encounter.  

## 2014-04-19 NOTE — Progress Notes (Signed)
04/14/14 12:50 AM     200 ml of fentanyl and 40 ml of versed IVF wasted in sink. Waste verified by Rip Harbour RN  Mick Sell RN

## 2014-04-19 NOTE — Progress Notes (Signed)
24-Apr-2014  0010   Patient time of death 2023/04/25. Famly at bedside. No heart tones or lung sounds noted. Pt asystole on the monitor. Pupils fixed and dilated. Elink MD notified. Family declined autopsy. Pt is not a medical examiners case. Non restraint death. Comfort provided  to family. CDS notified with pt time of death. Second nurse verify Rip Harbour RN.    Mick Sell RN

## 2014-04-19 DEATH — deceased

## 2014-04-22 IMAGING — CT CT CHEST W/ CM
2 of 3 series · 15 of 36 positions shown, 18 images · IV contrast (APPLIED)
Comparison: 02/06/2008.

CLINICAL DATA: Lymphoma.

EXAM:
CT CHEST WITH CONTRAST
TECHNIQUE: Multidetector CT imaging of the chest was performed during
intravenous contrast administration.
CONTRAST:  75mL OMNIPAQUE IOHEXOL 300 MG/ML  SOLN

[Series 2: thorax 5.0 i31f 1 · axial · 0.71mm/px · z∈[-834,-594]mm · 12 of 58 slices shown, 15 images]
[im 5/58  mediastinal]
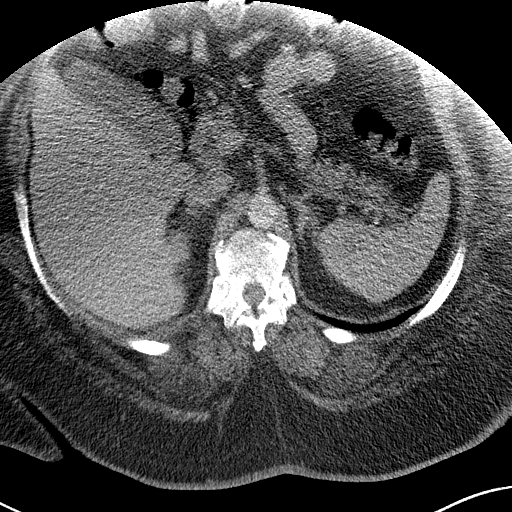
[im 5/58  lung]
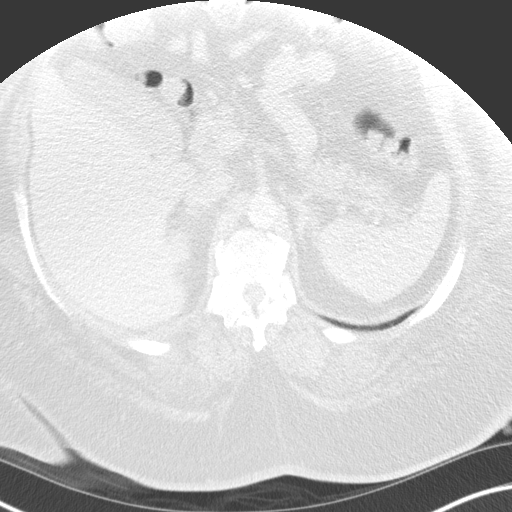
[im 9/58  lung]
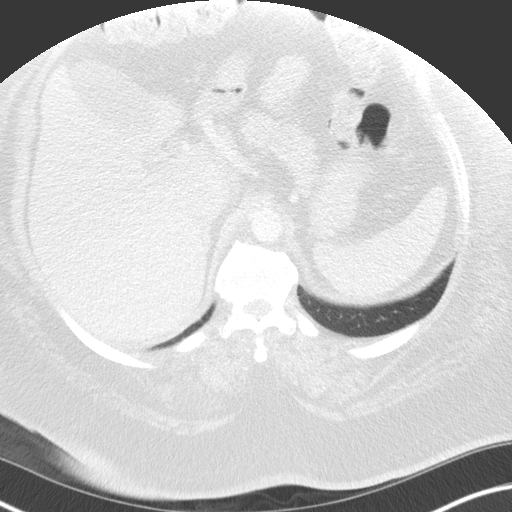
[im 13/58  lung]
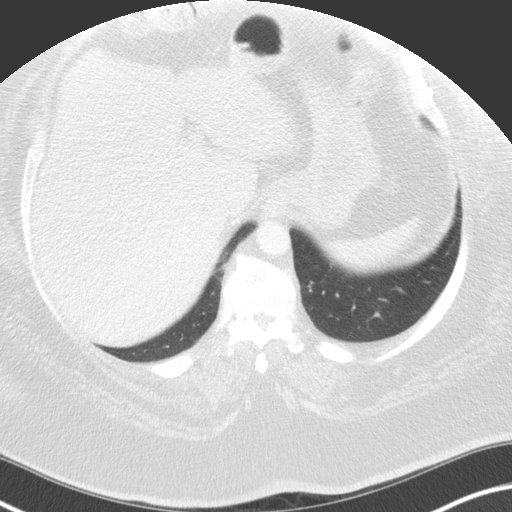
[im 17/58  lung]
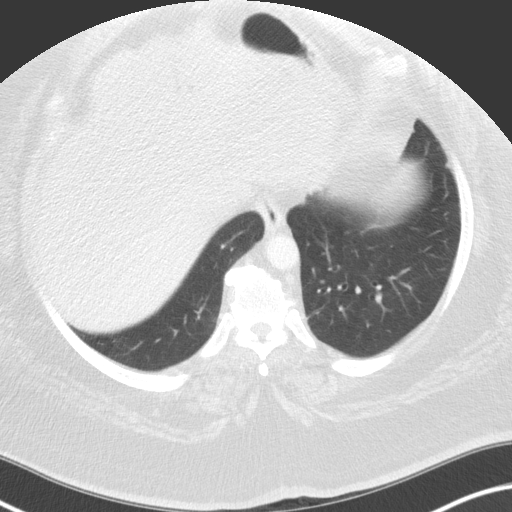
[im 22/58  mediastinal]
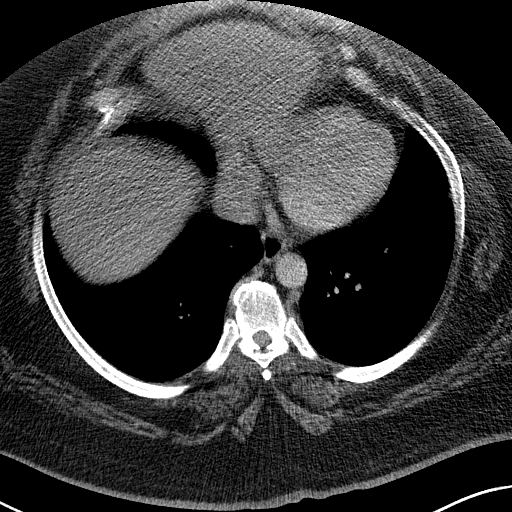
[im 22/58  lung]
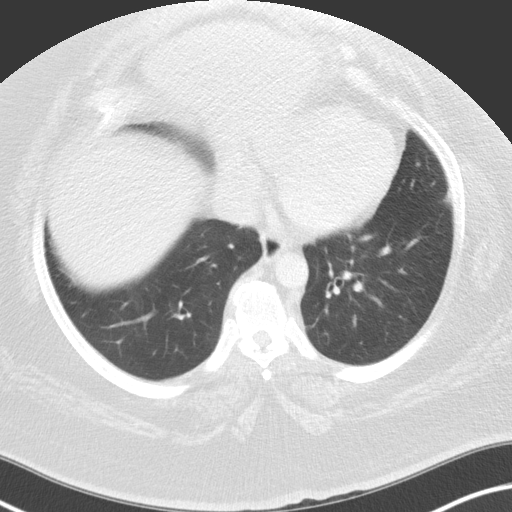
[im 26/58  lung]
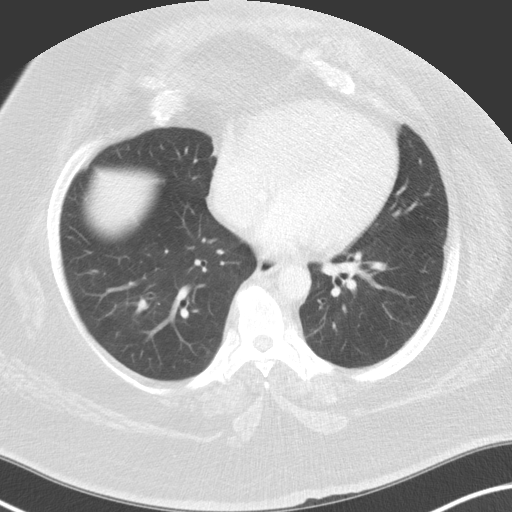
[im 32/58  lung]
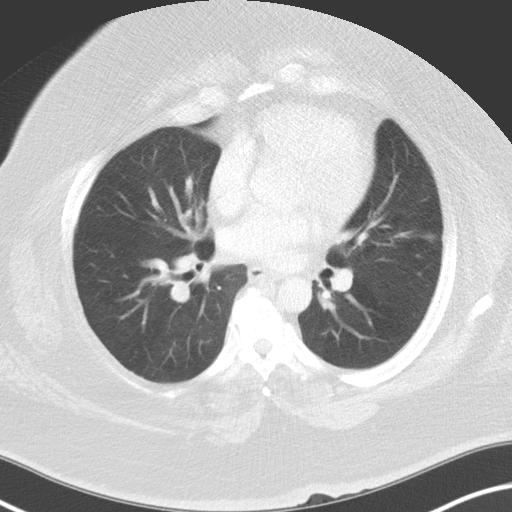
[im 36/58  lung]
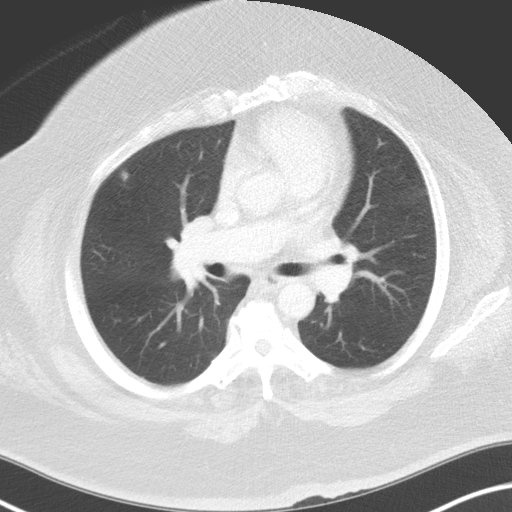
[im 41/58  mediastinal]
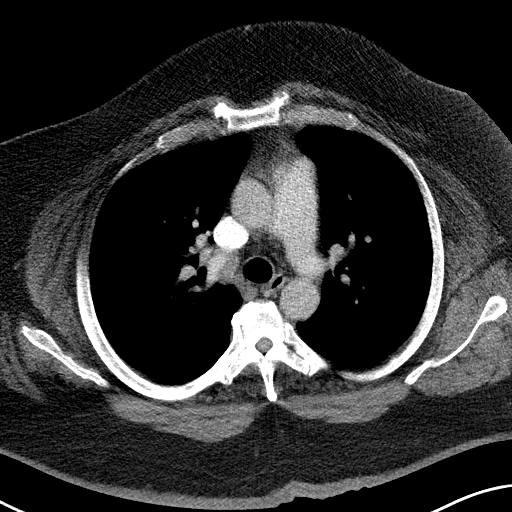
[im 41/58  lung]
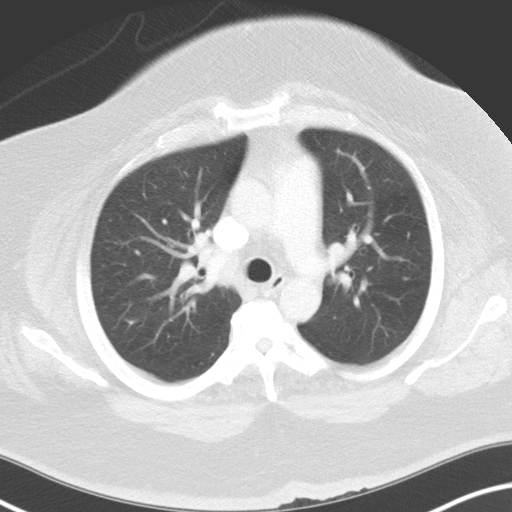
[im 45/58  lung]
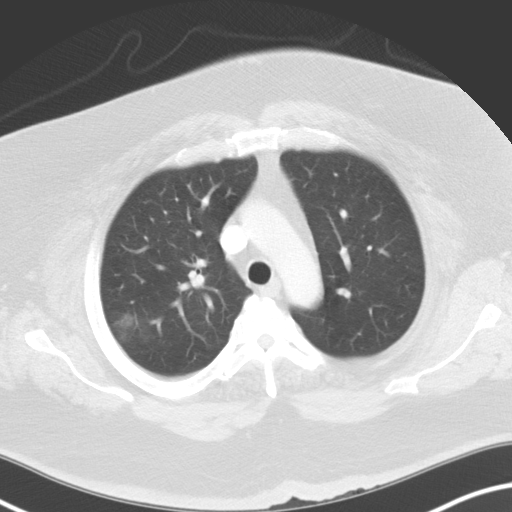
[im 49/58  lung]
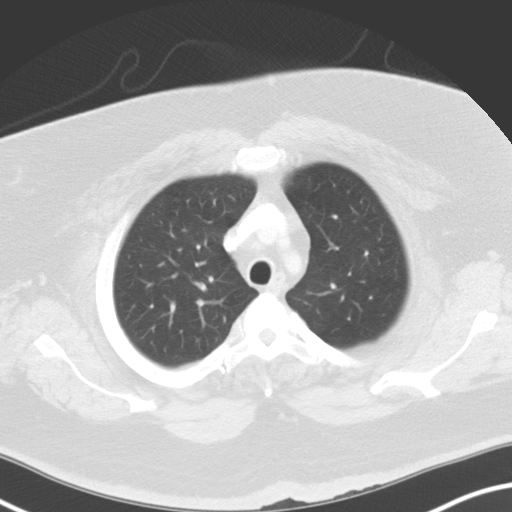
[im 53/58  lung]
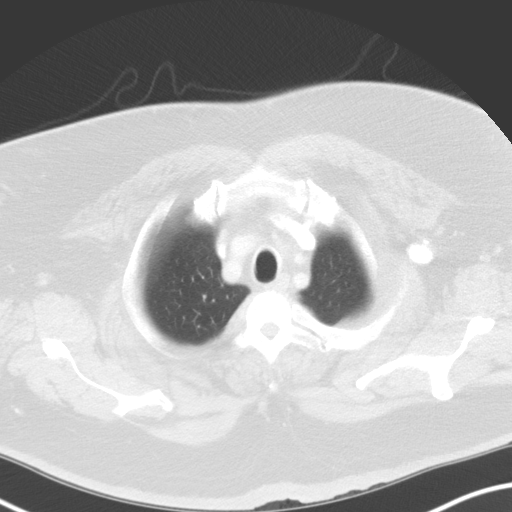

[Series 6: coronal · coronal · 0.59mm/px · 3 of 101 slices shown]
[im 21/101  lung]
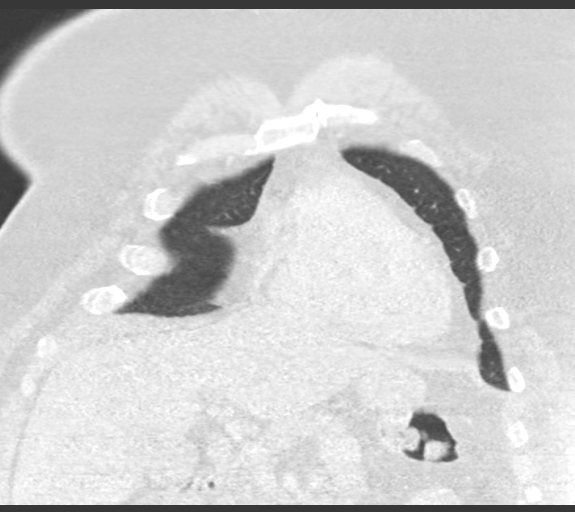
[im 41/101  lung]
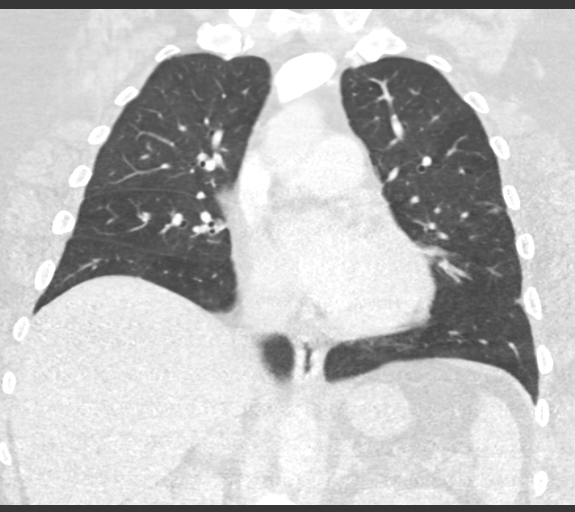
[im 61/101  lung]
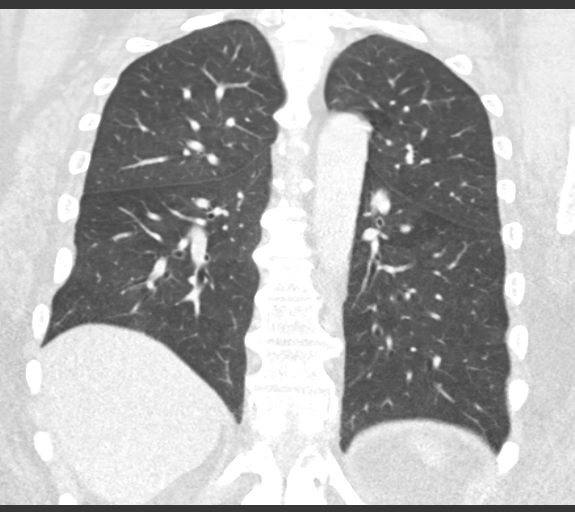

[15 of 36 positions shown; findings below may reference images not displayed]

FINDINGS: The chest wall is unremarkable. No supraclavicular or axillary
lymphadenopathy. Small scattered lymph nodes are noted. The bony
thorax is intact. No destructive bone lesions or spinal canal
compromise. Advanced degenerative changes involving the thoracic
spine with anterior and lateral bridging osteophytes.

The heart is normal in size. No pericardial effusion. No mediastinal
or hilar mass or lymphadenopathy. The aorta is normal in caliber. No
dissection. The esophagus is grossly normal.

Examination of the lung parenchyma demonstrates to pulmonary
nodules. There is a 12.5 mm lesion in the right upper lobe adjacent
to the pleura with a slight a low-fat interstitial change a rounded.
The second nodule is in the right middle lobe on image number 23 and
measures approximately 6 mm. A sub 3 mm subpleural pulmonary nodule
is noted in the right upper lobe on image number 28. The left lung
is clear. No pleural effusion. No acute pulmonary findings. No
interstitial lung disease or bronchiectasis chief

The upper abdomen is grossly normal.
IMPRESSION: Three right-sided pulmonary nodules. The largest measures 12.5 mm in
the right upper lobe and appears to have some inflammation around
it. Short-term followup noncontrast chest CT is recommended in 3
months to re-evaluate.

No supraclavicular, axillary, mediastinal or hilar lymphadenopathy.

## 2014-07-13 ENCOUNTER — Other Ambulatory Visit: Payer: Self-pay | Admitting: Pharmacist

## 2014-08-12 IMAGING — US US ASPIRATION
1 series · 14 of 16 positions shown · non-contrast
Comparison: 03/23/2014

CLINICAL DATA: Non-Hodgkin's lymphoma. Palpable region post cardiac
catheterization. Deep subcutaneous complex collection noted on
previous ultrasound.

EXAM:
US ASPIRATION
TECHNIQUE: Survey ultrasound of the left inguinal region was performed and a
hypoechoic deep subcutaneous collection was identified corresponding
to that seen on the prior study. Overlying skin prepped with
Betadine, draped in usual sterile fashion, infiltrated locally with
1% lidocaine. An 18-gauge needle was advanced into the collection
under ultrasound guidance. No fluid could be aspirated. The needle
was removed. No hemorrhage or other apparent complication.

[Series 1: us aspiration · 0.14mm/px · 14 of 17 slices shown]
[im 1/17]
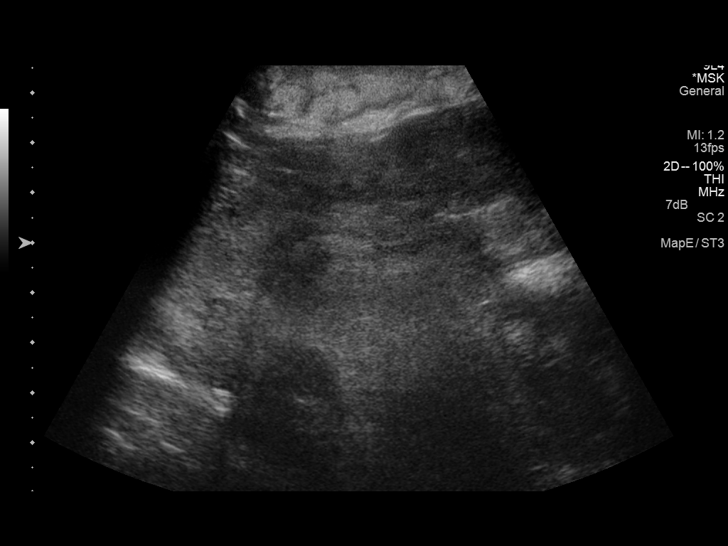
[im 2/17]
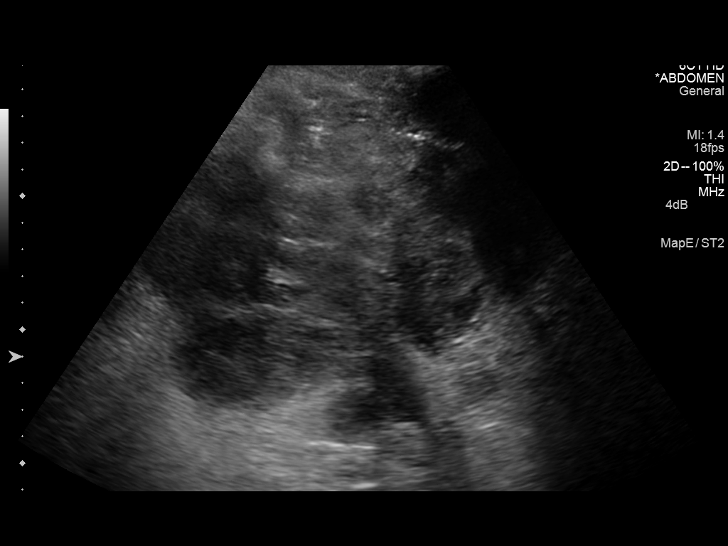
[im 3/17]
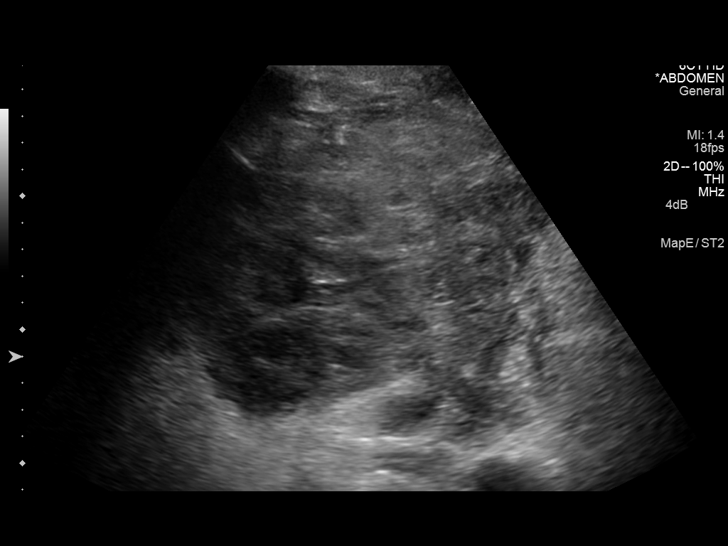
[im 5/17]
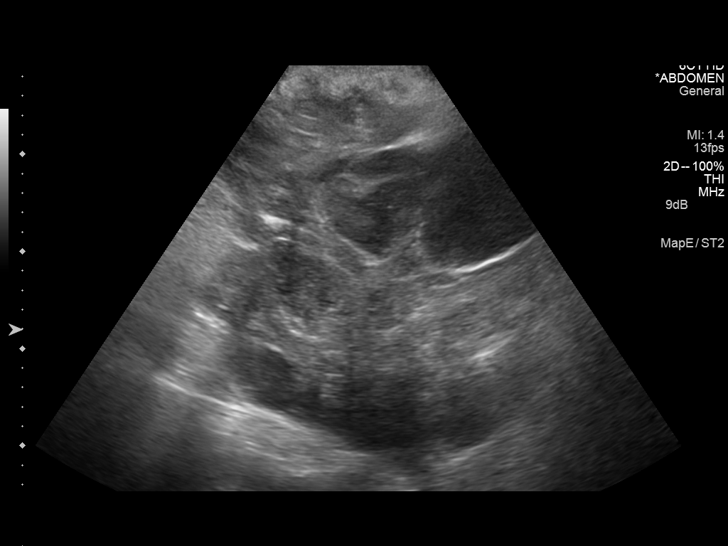
[im 6/17]
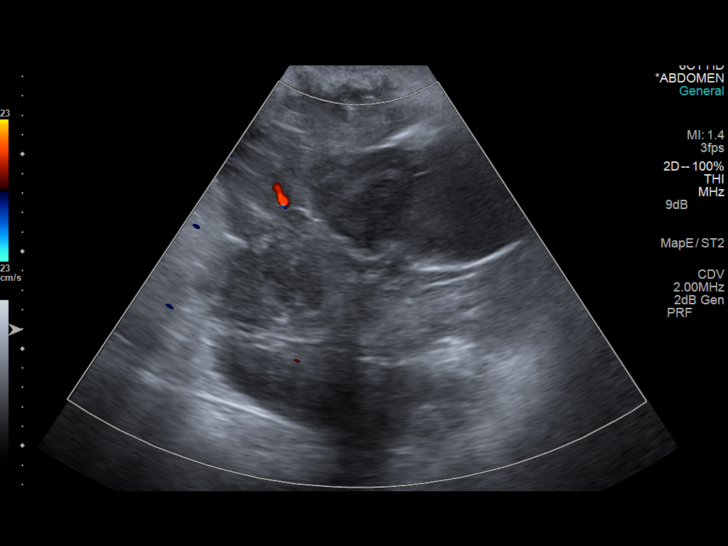
[im 7/17]
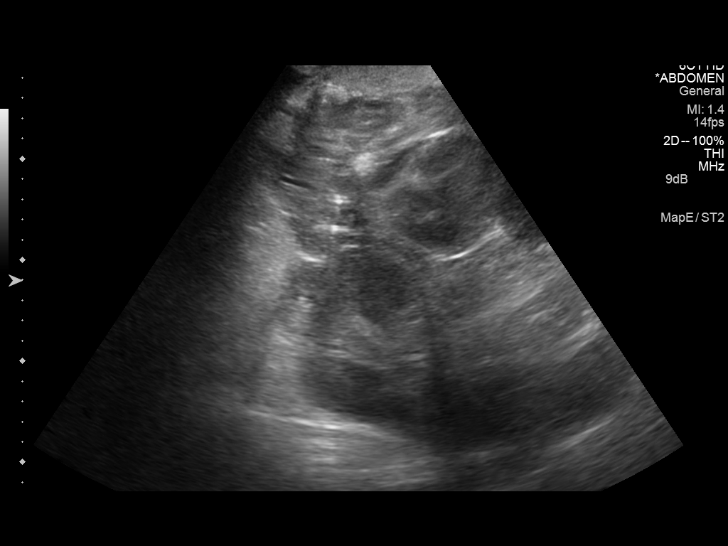
[im 8/17]
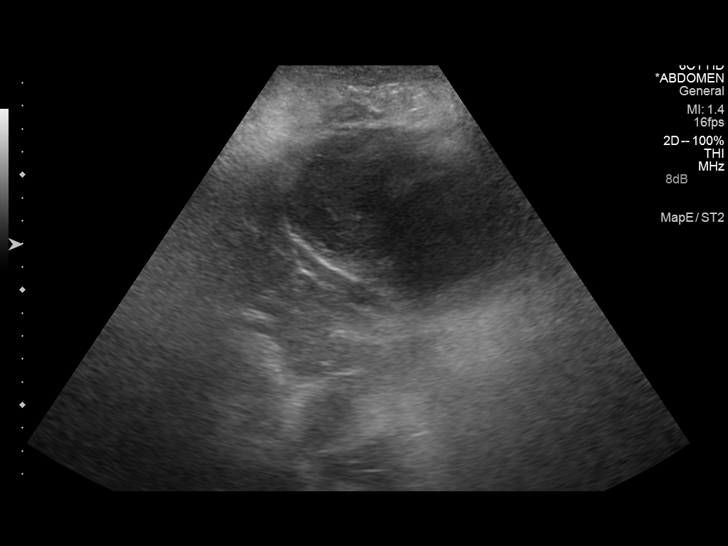
[im 9/17]
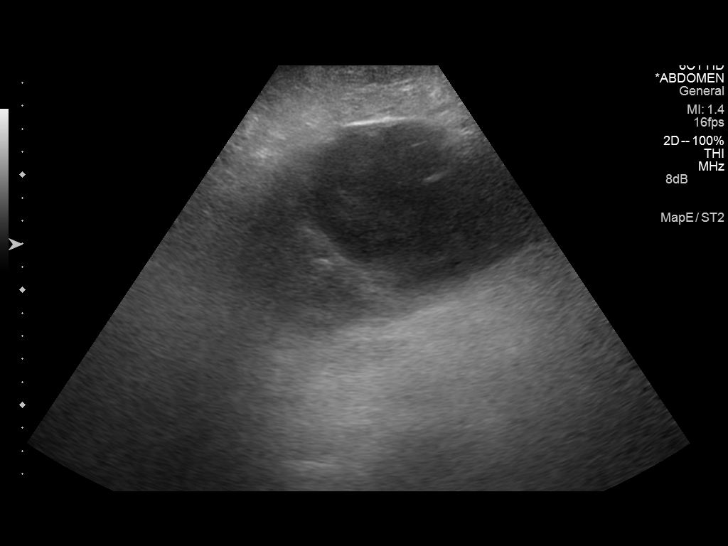
[im 10/17]
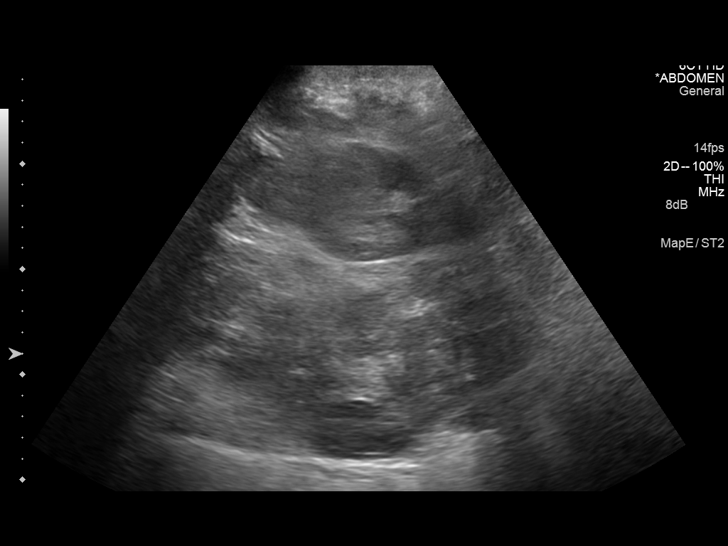
[im 11/17]
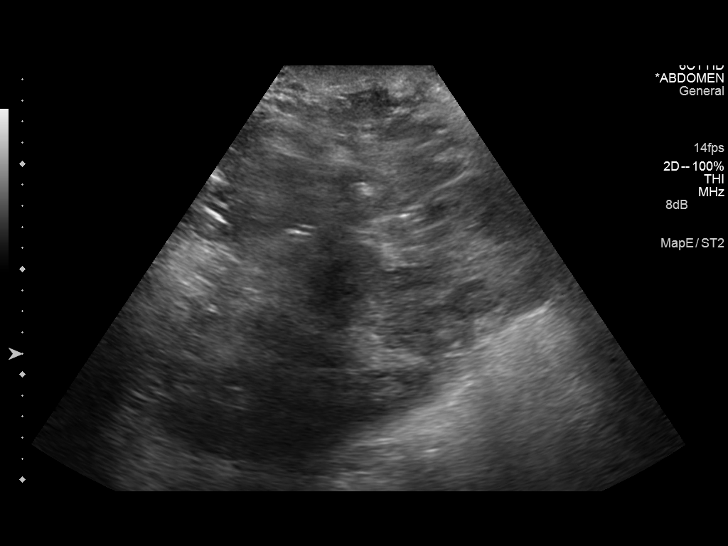
[im 13/17]
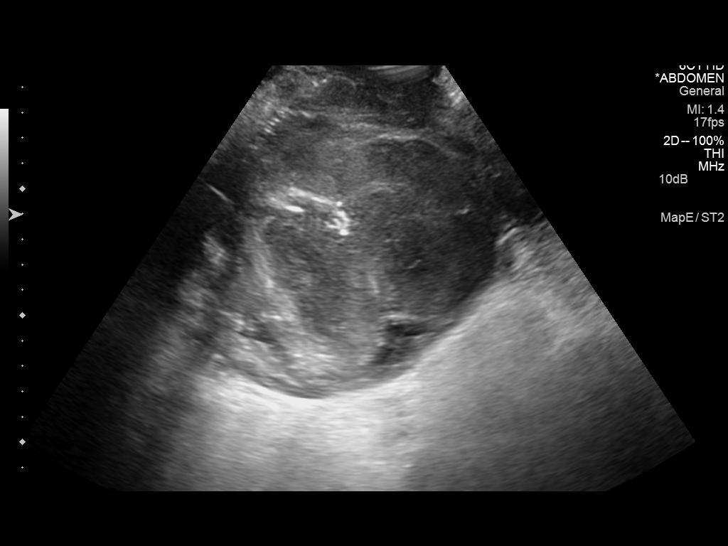
[im 14/17]
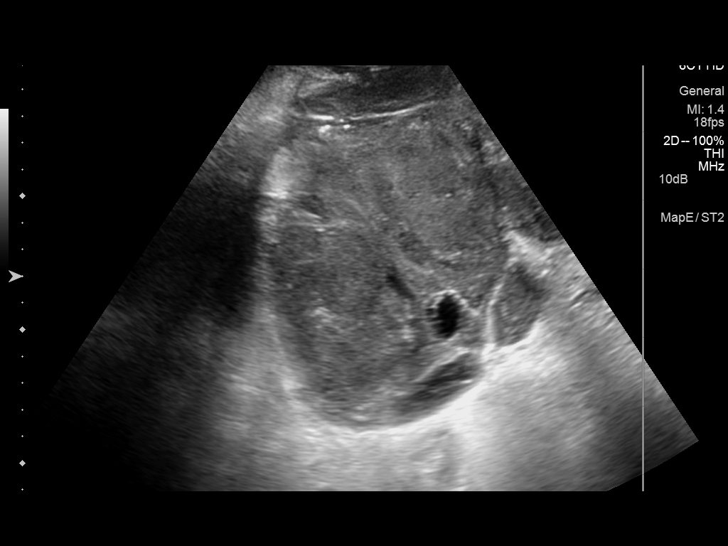
[im 15/17]
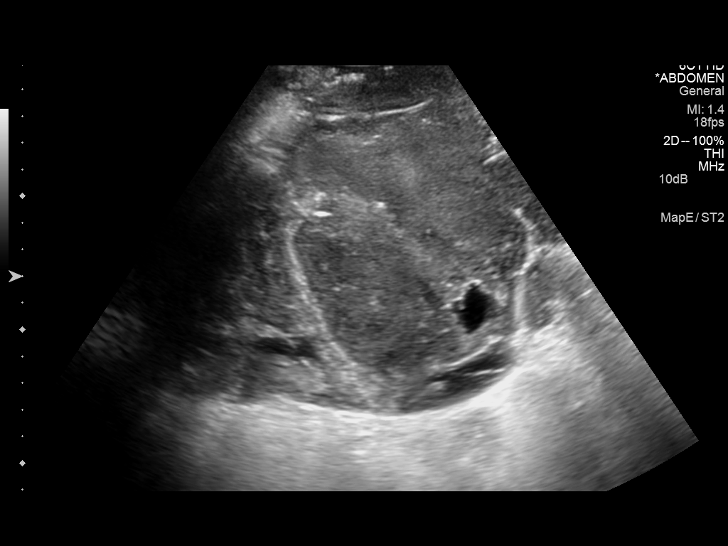
[im 17/17]
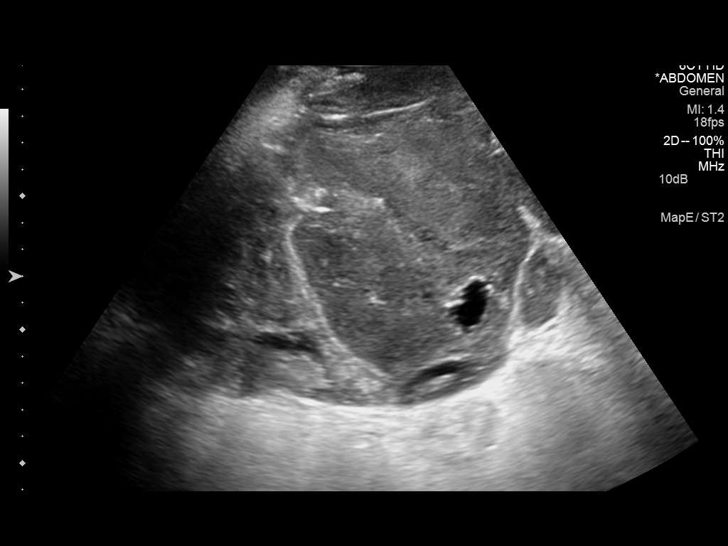

[14 of 16 positions shown; findings below may reference images not displayed]

IMPRESSION: 1. Attempted ultrasound-guided aspiration of left inguinal
collection, with no fluid returned.

## 2014-08-12 IMAGING — CR DG CHEST 1V
1 series · 1 of 1 positions shown · non-contrast
Comparison: DG CHEST 1V PORT dated 03/08/2014

CLINICAL DATA: Left internal jugular line placement

EXAM:
CHEST - 1 VIEW

[view not recorded]
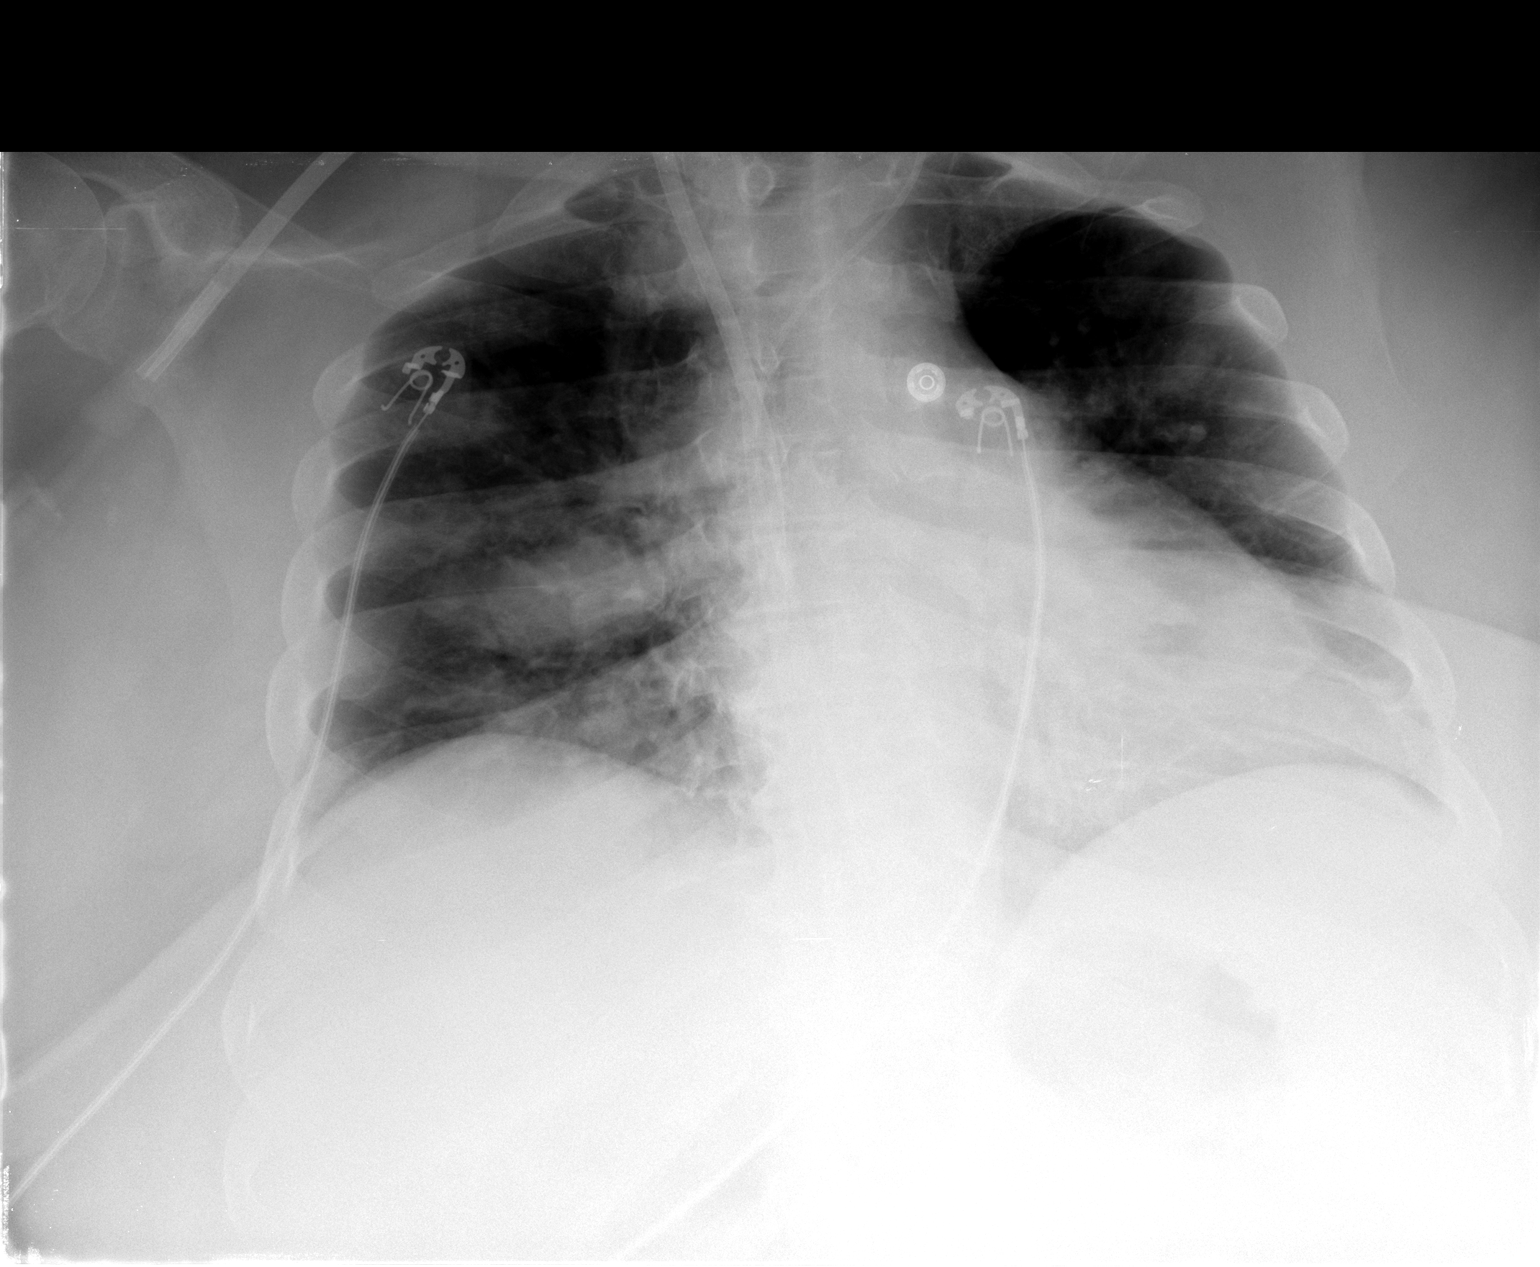

[1 of 1 positions shown; findings below may reference images not displayed]

FINDINGS: Dual lumen central line on the right is stable. Left internal
jugular line has been placed with tip obscured by existing central
line but at least as far as the superior vena cava, possibly into
right atrium or even right ventricle. There is no pneumothorax
status post placement.

There is increased consolidation in the right middle lobe in the
perihilar area.
IMPRESSION: 1. No pneumothorax status post left central line placement
2. Cannot identified tip of recently placed line, as it is obscured
by existing dual lumen central line
3. Increased right perihilar consolidation
